# Patient Record
Sex: Female | Born: 1945 | Race: Black or African American | Hispanic: No | Marital: Married | Smoking: Never smoker
Health system: Southern US, Community
[De-identification: ages and names within clinical notes are randomized; demographics above are authoritative.]

## PROBLEM LIST (undated history)

## (undated) DIAGNOSIS — M545 Low back pain, unspecified: Secondary | ICD-10-CM

## (undated) DIAGNOSIS — E785 Hyperlipidemia, unspecified: Secondary | ICD-10-CM

## (undated) DIAGNOSIS — M810 Age-related osteoporosis without current pathological fracture: Secondary | ICD-10-CM

## (undated) DIAGNOSIS — K5790 Diverticulosis of intestine, part unspecified, without perforation or abscess without bleeding: Secondary | ICD-10-CM

## (undated) DIAGNOSIS — J45909 Unspecified asthma, uncomplicated: Secondary | ICD-10-CM

## (undated) DIAGNOSIS — Z9889 Other specified postprocedural states: Secondary | ICD-10-CM

## (undated) DIAGNOSIS — E119 Type 2 diabetes mellitus without complications: Secondary | ICD-10-CM

## (undated) DIAGNOSIS — K219 Gastro-esophageal reflux disease without esophagitis: Secondary | ICD-10-CM

## (undated) DIAGNOSIS — R112 Nausea with vomiting, unspecified: Secondary | ICD-10-CM

## (undated) DIAGNOSIS — D649 Anemia, unspecified: Secondary | ICD-10-CM

## (undated) DIAGNOSIS — M5431 Sciatica, right side: Secondary | ICD-10-CM

## (undated) DIAGNOSIS — M1990 Unspecified osteoarthritis, unspecified site: Secondary | ICD-10-CM

## (undated) DIAGNOSIS — Z9109 Other allergy status, other than to drugs and biological substances: Secondary | ICD-10-CM

## (undated) DIAGNOSIS — J309 Allergic rhinitis, unspecified: Secondary | ICD-10-CM

## (undated) DIAGNOSIS — G8929 Other chronic pain: Secondary | ICD-10-CM

## (undated) DIAGNOSIS — T7840XA Allergy, unspecified, initial encounter: Secondary | ICD-10-CM

## (undated) DIAGNOSIS — M5432 Sciatica, left side: Secondary | ICD-10-CM

## (undated) DIAGNOSIS — K635 Polyp of colon: Secondary | ICD-10-CM

## (undated) DIAGNOSIS — H269 Unspecified cataract: Secondary | ICD-10-CM

## (undated) DIAGNOSIS — G43909 Migraine, unspecified, not intractable, without status migrainosus: Secondary | ICD-10-CM

## (undated) HISTORY — DX: Low back pain, unspecified: M54.50

## (undated) HISTORY — DX: Allergy, unspecified, initial encounter: T78.40XA

## (undated) HISTORY — PX: NASAL SINUS SURGERY: SHX719

## (undated) HISTORY — DX: Low back pain: M54.5

## (undated) HISTORY — DX: Other specified postprocedural states: R11.2

## (undated) HISTORY — DX: Diverticulosis of intestine, part unspecified, without perforation or abscess without bleeding: K57.90

## (undated) HISTORY — PX: DILATION AND CURETTAGE OF UTERUS: SHX78

## (undated) HISTORY — PX: COLONOSCOPY: SHX174

## (undated) HISTORY — DX: Polyp of colon: K63.5

## (undated) HISTORY — DX: Sciatica, right side: M54.31

## (undated) HISTORY — DX: Other chronic pain: G89.29

## (undated) HISTORY — DX: Gastro-esophageal reflux disease without esophagitis: K21.9

## (undated) HISTORY — DX: Other allergy status, other than to drugs and biological substances: Z91.09

## (undated) HISTORY — DX: Other specified postprocedural states: Z98.890

## (undated) HISTORY — PX: DENTAL SURGERY: SHX609

## (undated) HISTORY — DX: Age-related osteoporosis without current pathological fracture: M81.0

## (undated) HISTORY — PX: TUBAL LIGATION: SHX77

## (undated) HISTORY — DX: Unspecified cataract: H26.9

## (undated) HISTORY — DX: Hyperlipidemia, unspecified: E78.5

## (undated) HISTORY — DX: Unspecified asthma, uncomplicated: J45.909

## (undated) HISTORY — DX: Anemia, unspecified: D64.9

## (undated) HISTORY — DX: Type 2 diabetes mellitus without complications: E11.9

## (undated) HISTORY — DX: Allergic rhinitis, unspecified: J30.9

## (undated) HISTORY — DX: Migraine, unspecified, not intractable, without status migrainosus: G43.909

## (undated) HISTORY — DX: Sciatica, right side: M54.32

## (undated) HISTORY — DX: Unspecified osteoarthritis, unspecified site: M19.90

---

## 2010-12-19 HISTORY — PX: CATARACT EXTRACTION, BILATERAL: SHX1313

## 2015-08-17 ENCOUNTER — Other Ambulatory Visit: Payer: Self-pay | Admitting: Internal Medicine

## 2015-08-17 DIAGNOSIS — E119 Type 2 diabetes mellitus without complications: Secondary | ICD-10-CM | POA: Diagnosis not present

## 2015-08-17 DIAGNOSIS — I10 Essential (primary) hypertension: Secondary | ICD-10-CM | POA: Diagnosis not present

## 2015-08-17 DIAGNOSIS — Z0000 Encounter for general adult medical examination without abnormal findings: Secondary | ICD-10-CM | POA: Diagnosis not present

## 2015-08-17 DIAGNOSIS — Z1389 Encounter for screening for other disorder: Secondary | ICD-10-CM | POA: Diagnosis not present

## 2015-09-08 ENCOUNTER — Other Ambulatory Visit: Payer: Self-pay

## 2015-09-08 DIAGNOSIS — Z1231 Encounter for screening mammogram for malignant neoplasm of breast: Secondary | ICD-10-CM

## 2015-09-10 ENCOUNTER — Ambulatory Visit: Payer: Commercial Managed Care - HMO | Admitting: Neurology

## 2015-09-11 ENCOUNTER — Ambulatory Visit: Payer: Medicare HMO | Admitting: Podiatry

## 2015-09-16 ENCOUNTER — Encounter: Payer: Self-pay | Admitting: Internal Medicine

## 2015-09-17 ENCOUNTER — Other Ambulatory Visit: Payer: Self-pay | Admitting: Allergy and Immunology

## 2015-09-17 DIAGNOSIS — J309 Allergic rhinitis, unspecified: Secondary | ICD-10-CM

## 2015-09-21 ENCOUNTER — Ambulatory Visit (AMBULATORY_SURGERY_CENTER): Payer: Self-pay | Admitting: *Deleted

## 2015-09-21 ENCOUNTER — Telehealth: Payer: Self-pay | Admitting: *Deleted

## 2015-09-21 ENCOUNTER — Other Ambulatory Visit: Payer: Self-pay | Admitting: Allergy and Immunology

## 2015-09-21 VITALS — Ht 64.75 in | Wt 145.8 lb

## 2015-09-21 DIAGNOSIS — J309 Allergic rhinitis, unspecified: Secondary | ICD-10-CM

## 2015-09-21 DIAGNOSIS — Z86010 Personal history of colonic polyps: Secondary | ICD-10-CM

## 2015-09-21 NOTE — Telephone Encounter (Signed)
Pt here for Pv for upcoming colon.  Pt states she has had a previous colon- she cannot remember if it was 1 yr ago but states less than 10 but more than 5. She states she had colon polyps but unsure what kind, she isn't sure about MD name, city or hospital.  She did have egd and colon together she states.  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 She states now she has dysphagia, GERD, and certain foods cause pain to swallow and its hard to get the food to pass.  Spoke with Dr Carlean Purl, he reviewed her med list and we discussed above mentioned and decided pt needs an OV, non emergent. Cancelled PV and colon for October and scheduled 12-5 OV with  Dr Carlean Purl. Instructed pt to arrive at 130 pm to ck in on 3rd floor that day . Pt instructed to find out who her previous GI dr was and get name, number, place of procedure so we may get old records. Gave her a records release form to fill in her part with instructions of what we need to get her old records.  Informed pt we needs these records prior to her 11-23-15 OV if at all possible.  Pt verbalized understanding of all this. Encouraged to call with questions.  Lelan Pons PV

## 2015-09-22 ENCOUNTER — Ambulatory Visit (INDEPENDENT_AMBULATORY_CARE_PROVIDER_SITE_OTHER): Payer: Medicare HMO | Admitting: Neurology

## 2015-09-22 ENCOUNTER — Encounter: Payer: Self-pay | Admitting: Neurology

## 2015-09-22 VITALS — BP 123/72 | HR 75 | Ht 64.0 in | Wt 148.0 lb

## 2015-09-22 DIAGNOSIS — M5416 Radiculopathy, lumbar region: Secondary | ICD-10-CM | POA: Diagnosis not present

## 2015-09-22 DIAGNOSIS — M5441 Lumbago with sciatica, right side: Secondary | ICD-10-CM

## 2015-09-22 DIAGNOSIS — M5442 Lumbago with sciatica, left side: Secondary | ICD-10-CM | POA: Diagnosis not present

## 2015-09-22 DIAGNOSIS — E1142 Type 2 diabetes mellitus with diabetic polyneuropathy: Secondary | ICD-10-CM

## 2015-09-22 NOTE — Progress Notes (Signed)
PATIENT: Kimberly Lam DOB: 08/21/1946  Chief Complaint  Patient presents with  . Back Pain    She has been having low back pain for the last few years.  The pain radiates into her bilateral hips and legs. She also has problems with leg cramps and leg weakness.  She has had two epidural steroid injections that were helpful.     HISTORICAL  Kimberly Lam is a 69 years old right-handed female, seen in refer by  her primary care physician Sandi Mariscal, MD in September 22 2015 for evaluation of low back pain, radiating pain to bilateral hip, bilateral leg cramping, and weakness.  She reported a history of low back pain since 2006, midline low back, occasionally go to posterior thigh, go down below her knee, worsening with prolonged standing, or walking,  Previous her low back pain has responded very well to epidural injection, she had 3 total, most recent injection was in March 16 by local neurosurgeon, she reported only temporary improvement, she had recurrent decline low back pain, radiating pain to bilateral lower extremity again, in addition, she complains of bilateral feet paresthesia,   She had a history of diabetes, she denies gait difficulty, no bowel and bladder incontinence, she denies bilateral upper extremity paresthesia or weakness.   REVIEW OF SYSTEMS: Full 14 system review of systems performed and notable only for shortness of breath, hearing loss, ringing in ears, feeling hot, joint pain, cramps, allergy, runny nose, numbness, weakness, difficulty swallowing, insomnia, restless leg, not enough sleep  ALLERGIES: Allergies  Allergen Reactions  . Peanut-Containing Drug Products Anaphylaxis  . Actos [Pioglitazone] Nausea Only  . Vicodin [Hydrocodone-Acetaminophen] Nausea Only    HOME MEDICATIONS: Current Outpatient Prescriptions  Medication Sig Dispense Refill  . albuterol (PROVENTIL) (2.5 MG/3ML) 0.083% nebulizer solution Take 2.5 mg by nebulization every 6 (six) hours as  needed for wheezing or shortness of breath.    Marland Kitchen alendronate (FOSAMAX) 70 MG tablet Take 70 mg by mouth once a week. Take with a full glass of water on an empty stomach.    . Azelastine-Fluticasone 137-50 MCG/ACT SUSP Place into the nose.    . cyclobenzaprine (FLEXERIL) 10 MG tablet Take 10 mg by mouth 3 (three) times daily as needed for muscle spasms.    . CYCLOBENZAPRINE HCL PO Take by mouth.    . cycloSPORINE (RESTASIS) 0.05 % ophthalmic emulsion 1 drop 2 (two) times daily.    . diclofenac (VOLTAREN) 75 MG EC tablet Take 75 mg by mouth 2 (two) times daily.    . fexofenadine-pseudoephedrine (ALLEGRA-D 24) 180-240 MG 24 hr tablet Take 1 tablet by mouth daily.    . fluticasone (FLONASE) 50 MCG/ACT nasal spray Place into both nostrils daily.    Marland Kitchen gabapentin (NEURONTIN) 300 MG capsule Take 300 mg by mouth 3 (three) times daily.    . insulin aspart (NOVOLOG) 100 unit/mL injection Inject 10 Units into the skin 3 (three) times daily before meals.    . insulin regular (NOVOLIN R,HUMULIN R) 100 units/mL injection Inject into the skin. 18-45 units    . levocetirizine (XYZAL) 5 MG tablet Take 5 mg by mouth every evening.    . metFORMIN (GLUCOPHAGE) 500 MG tablet Take by mouth 2 (two) times daily with a meal.    . montelukast (SINGULAIR) 10 MG tablet Take 10 mg by mouth at bedtime.    Marland Kitchen omeprazole (PRILOSEC) 40 MG capsule Take 40 mg by mouth daily.    . ramipril (ALTACE) 2.5 MG capsule  Take 2.5 mg by mouth daily.    . simvastatin (ZOCOR) 20 MG tablet Take 20 mg by mouth daily.     No current facility-administered medications for this visit.    PAST MEDICAL HISTORY: Past Medical History  Diagnosis Date  . Acid reflux   . Environmental allergies     flowers, pollen, trees  . Colon polyps   . Diabetes ([place])   . Migraine   . Hyperlipidemia   . Low back pain     PAST SURGICAL HISTORY: Past Surgical History  Procedure Laterality Date  . Cesarean section      x 2  . Tubal ligation    . Nasal  sinus surgery      FAMILY HISTORY: Family History  Problem Relation Age of Onset  . Prostate cancer Brother   . Glaucoma Brother   . Emphysema Father     SOCIAL HISTORY:  Social History   Social History  . Marital Status: Married    Spouse Name: N/A  . Number of Children: 2  . Years of Education: 16   Occupational History  . Retired    Social History Main Topics  . Smoking status: Never Smoker   . Smokeless tobacco: Not on file  . Alcohol Use: No  . Drug Use: No  . Sexual Activity: Not on file   Other Topics Concern  . Not on file   Social History Narrative   Lives at home with her husband and brother.   Right-handed.   No caffeine use.     PHYSICAL EXAM   Filed Vitals:   09/22/15 1315  BP: 123/72  Pulse: 75  Height: 5\' 4"  (1.626 m)  Weight: 148 lb (67.132 kg)    Not recorded      Body mass index is 25.39 kg/(m^2).  PHYSICAL EXAMNIATION:  Gen: NAD, conversant, well nourised, obese, well groomed                     Cardiovascular: Regular rate rhythm, no peripheral edema, warm, nontender. Eyes: Conjunctivae clear without exudates or hemorrhage Neck: Supple, no carotid bruise. Pulmonary: Clear to auscultation bilaterally   NEUROLOGICAL EXAM:  MENTAL STATUS: Speech:    Speech is normal; fluent and spontaneous with normal comprehension.  Cognition:     Orientation to time, place and person     Normal recent and remote memory     Normal Attention span and concentration     Normal Language, naming, repeating,spontaneous speech     Fund of knowledge   CRANIAL NERVES: CN II: Visual fields are full to confrontation. Fundoscopic exam is normal with sharp discs and no vascular changes. Pupils are round equal and briskly reactive to light. CN III, IV, VI: extraocular movement are normal. No ptosis. CN V: Facial sensation is intact to pinprick in all 3 divisions bilaterally. Corneal responses are intact.  CN VII: Face is symmetric with normal eye  closure and smile. CN VIII: Hearing is normal to rubbing fingers CN IX, X: Palate elevates symmetrically. Phonation is normal. CN XI: Head turning and shoulder shrug are intact CN XII: Tongue is midline with normal movements and no atrophy.  MOTOR: There is no pronator drift of out-stretched arms. Muscle bulk and tone are normal. Muscle strength is normal.  REFLEXES: Reflexes are 2+ and symmetric at the biceps, triceps, knees, and cheese at ankles. Plantar responses are flexor.  SENSORY: Length dependent decreased light touch, pinprick to ankle level  COORDINATION: Rapid alternating  movements and fine finger movements are intact. There is no dysmetria on finger-to-nose and heel-knee-shin.    GAIT/STANCE: Posture is normal. Gait is steady with normal steps, base, arm swing, and turning. Heel and toe walking are normal. Tandem gait is normal.  Romberg is absent.   DIAGNOSTIC DATA (LABS, IMAGING, TESTING) - I reviewed patient records, labs, notes, testing and imaging myself where available.   ASSESSMENT AND PLAN  Rees Matura is a 69 y.o. female   Chronic low back pain, radiating pain to bilateral lower extremity suggestive of lumbar radiculopathy She also has insulin-dependent diabetes, evidence of diabetic peripheral neuropathy  EMG nerve conduction study  MRI of lumbar   Marcial Pacas, M.D. Ph.D.  [facility] [address], [place], [place] [address] Ph: [phone] Fax: [phone]  CC: Sandi Mariscal, MD

## 2015-09-28 ENCOUNTER — Telehealth: Payer: Self-pay | Admitting: Neurology

## 2015-09-28 NOTE — Telephone Encounter (Signed)
Spoke with patient and inform her that I sent over her MRI order on 09/22/15 to [facility]. I also advised her that [facility] tried to call her and left her a message on 09/23/15 to schedule her MRI. I advised the patient to call [facility] [phone] to set up her appointment. Patient stated that she will call them to schedule.

## 2015-09-29 ENCOUNTER — Encounter: Payer: Self-pay | Admitting: Internal Medicine

## 2015-10-07 ENCOUNTER — Ambulatory Visit: Payer: Self-pay

## 2015-10-12 ENCOUNTER — Inpatient Hospital Stay: Admission: RE | Admit: 2015-10-12 | Payer: Self-pay | Source: Ambulatory Visit

## 2015-10-21 DIAGNOSIS — J3089 Other allergic rhinitis: Secondary | ICD-10-CM | POA: Diagnosis not present

## 2015-10-22 ENCOUNTER — Encounter: Payer: Medicare HMO | Admitting: Neurology

## 2015-10-22 DIAGNOSIS — J301 Allergic rhinitis due to pollen: Secondary | ICD-10-CM | POA: Diagnosis not present

## 2015-10-22 NOTE — Progress Notes (Signed)
This encounter was created in error - please disregard.

## 2015-10-27 ENCOUNTER — Other Ambulatory Visit: Payer: Self-pay | Admitting: *Deleted

## 2015-10-27 ENCOUNTER — Ambulatory Visit (INDEPENDENT_AMBULATORY_CARE_PROVIDER_SITE_OTHER): Payer: Medicare HMO

## 2015-10-27 DIAGNOSIS — J309 Allergic rhinitis, unspecified: Secondary | ICD-10-CM

## 2015-10-27 MED ORDER — ALBUTEROL SULFATE HFA 108 (90 BASE) MCG/ACT IN AERS: 2.0000 | INHALATION_SPRAY | RESPIRATORY_TRACT | Status: DC | PRN

## 2015-10-27 NOTE — Progress Notes (Signed)
Immunotherapy   Patient Details  Name: Marlyss Cissell MRN: [id number] Date of Birth: [birth year]  10/27/2015  Fletcher Anon started injections for TREE/GRASS-WEED-MITE-CAT-DOG/MOLD-CR. GREEN 1:1000 @ .05 GIVEN Following schedule: B  Frequency:2 times per week Epi-Pen:Prescription for Epi-Pen given FROM HER PREVIOUS ALLERGIES IN [place] Consent signed and patient instructions given.   Jerrion Tabbert 10/27/2015, 11:10 AM

## 2015-11-03 ENCOUNTER — Other Ambulatory Visit: Payer: Self-pay | Admitting: Allergy and Immunology

## 2015-11-03 MED ORDER — EPINEPHRINE 0.3 MG/0.3ML IJ SOAJ: 0.3000 mg | Freq: Once | INTRAMUSCULAR | Status: DC

## 2015-11-03 NOTE — Telephone Encounter (Signed)
Pt called about an expired Epi-pen. And something about moving to a different state???? She is requesting a new refill [facility] on [address] pls advise

## 2015-11-11 ENCOUNTER — Ambulatory Visit (INDEPENDENT_AMBULATORY_CARE_PROVIDER_SITE_OTHER): Payer: Medicare HMO | Admitting: Neurology

## 2015-11-11 DIAGNOSIS — J309 Allergic rhinitis, unspecified: Secondary | ICD-10-CM | POA: Diagnosis not present

## 2015-11-17 ENCOUNTER — Telehealth: Payer: Self-pay | Admitting: Neurology

## 2015-11-17 ENCOUNTER — Ambulatory Visit (INDEPENDENT_AMBULATORY_CARE_PROVIDER_SITE_OTHER): Payer: Medicare HMO | Admitting: Neurology

## 2015-11-17 ENCOUNTER — Telehealth: Payer: Self-pay

## 2015-11-17 DIAGNOSIS — M5441 Lumbago with sciatica, right side: Secondary | ICD-10-CM

## 2015-11-17 DIAGNOSIS — E1142 Type 2 diabetes mellitus with diabetic polyneuropathy: Secondary | ICD-10-CM

## 2015-11-17 DIAGNOSIS — M5416 Radiculopathy, lumbar region: Secondary | ICD-10-CM

## 2015-11-17 DIAGNOSIS — M5442 Lumbago with sciatica, left side: Secondary | ICD-10-CM

## 2015-11-17 MED ORDER — DICLOFENAC SODIUM 75 MG PO TBEC: DELAYED_RELEASE_TABLET | ORAL | Status: DC

## 2015-11-17 NOTE — Telephone Encounter (Signed)
Kimberly Lam, let her know I have called in Baclofen 75mg  bid as needed x60 tabs with 3 refill. Please advise her to proceed with MRI lumbar as ordered

## 2015-11-17 NOTE — Telephone Encounter (Signed)
Follow up appt scheduled to discuss results.

## 2015-11-17 NOTE — Telephone Encounter (Signed)
Patient would like to know if Dr Krista Blue will start prescribing Diclofenac 75mg  tablets.  Please advise.  Thank you.

## 2015-11-17 NOTE — Procedures (Signed)
[facility]  [place], [place]  [place], [place] [address] Tel: [phone] Fax:  [phone] Test Date:  11/17/2015  Patient: Kimberly Lam DOB: 1946/11/14 Physician: Narda Amber, DO  Sex: Female Height: 5\' 5"  Ref Phys: Dr Krista Blue  ID#: [id number]   Technician: Jerilynn Mages. Dean   Patient Complaints: This is a 69 year old female presenting for evaluation of chronic low back pain radiating into her legs and paresthesias of the feet.  NCV & EMG Findings: Extensive electrodiagnostic testing of the right upper and lower extremities and additional studies of the left shows: 1. Right median and ulnar sensory responses are within normal limits. 2. Right median and ulnar motor responses are within normal limits. 3. Right sural sensory response is absent. Left sural sensory response is reduced in amplitude. Bilateral superficial peroneal sensory responses are within normal limits. 4. Bilateral peroneal and tibial motor responses are within normal limits. 5. Bilateral tibial H reflex studies are within normal limits. 6. Chronic motor axon loss changes are seen affecting the L5 myotomes bilaterally, without accompanied active denervation.  Impression: 1. The electrophysiologic findings are most consistent with a distal and symmetric sensorimotor polyneuropathy, axon loss in type, affecting the lower extremities.  Overall, these findings are mild to moderate in degree electrically and restricted to the lower extremities. 2. 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     ___________________________ Narda Amber, DO    Nerve Conduction Studies Anti Sensory Summary Table   Site NR Peak (ms) Norm Peak (ms) P-T Amp (V) Norm P-T Amp  Right Median Anti Sensory (2nd Digit)  32.2C  Wrist    3.5 <3.8 25.3 >10  Left Sup Peroneal Anti Sensory (Ant Lat Mall)  12 cm    3.8 <4.6 6.9 >3  Right Sup Peroneal Anti Sensory (Ant Lat  Mall)  32.2C  12 cm    3.9 <4.6 9.6 >3  Left Sural Anti Sensory (Lat Mall)  Calf    4.0 <4.6 2.7 >3  Right Sural Anti Sensory (Lat Mall)  32.2C  Calf NR  <4.6  >3  Right Ulnar Anti Sensory (5th Digit)  32.2C  Wrist    3.1 <3.2 19.5 >5   Motor Summary Table   Site NR Onset (ms) Norm Onset (ms) O-P Amp (mV) Norm O-P Amp Site1 Site2 Delta-0 (ms) Dist (cm) Vel (m/s) Norm Vel (m/s)  Right Median Motor (Abd Poll Brev)  32.2C  Wrist    3.5 <4.0 7.8 >5 Elbow Wrist 4.5 26.0 58 >50  Elbow    8.0  7.1         Left Peroneal Motor (Ext Dig Brev)  Ankle    3.4 <6.0 6.9 >2.5 B Fib Ankle 7.2 33.0 46 >40  B Fib    10.6  5.8  Poplt B Fib 1.7 10.0 59 >40  Poplt    12.3  5.3         Right Peroneal Motor (Ext Dig Brev)  32.2C  Ankle    3.8 <6.0 5.9 >2.5 B Fib Ankle 7.1 33.0 46 >40  B Fib    10.9  4.8  Poplt B Fib 1.8 10.0 56 >40  Poplt    12.7  3.5         Left Tibial Motor (Abd Hall Brev)  Ankle    3.6 <6.0 6.1 >4 Knee Ankle 8.7 38.0 44 >40  Knee    12.3  3.4  Right Tibial Motor (Abd Hall Brev)  32.2C  Ankle    3.3 <6.0 8.3 >4 Knee Ankle 9.1 39.0 43 >40  Knee    12.4  6.5         Right Ulnar Motor (Abd Dig Minimi)  32.2C  Wrist    2.7 <3.1 8.2 >7 B Elbow Wrist 3.8 20.0 53 >50  B Elbow    6.5  7.8  A Elbow B Elbow 1.8 10.0 56 >50  A Elbow    8.3  7.7          H Reflex Studies   NR H-Lat (ms) Lat Norm (ms) L-R H-Lat (ms)  Left Tibial (Gastroc)     31.97 <35 0.00  Right Tibial (Gastroc)  32.2C     31.97 <35 0.00   EMG   Side Muscle Ins Act Fibs Psw Fasc Number Recrt Dur Dur. Amp Amp. Poly Poly. Comment  Right AntTibialis Nml Nml Nml Nml 1- Rapid Many 1+ Many 1+ Some 1+ N/A  Right Gastroc Nml Nml Nml Nml 1- Mod-R Some 1+ Some 1+ Nml Nml N/A  Right Flex Dig Long Nml Nml Nml Nml 2- Rapid Some 1+ Some 1+ Nml Nml N/A  Right RectFemoris Nml Nml Nml Nml Nml Nml Nml Nml Nml Nml Nml Nml N/A  Right GluteusMed Nml Nml Nml Nml 1- Rapid Some 1+ Some 1+ Nml Nml N/A  Right BicepsFemS Nml  Nml Nml Nml Nml Nml Nml Nml Nml Nml Nml Nml N/A  Left AntTibialis Nml Nml Nml Nml 1- Rapid Some 1+ Some 1+ Nml Nml N/A  Left Gastroc Nml Nml Nml Nml 1- Mod-R Some 1+ Some 1+ Nml Nml N/A  Left Flex Dig Long Nml Nml Nml Nml 1- Rapid Some 1+ Some 1+ Nml Nml N/A  Left RectFemoris Nml Nml Nml Nml Nml Nml Nml Nml Nml Nml Nml Nml N/A  Left GluteusMed Nml Nml Nml Nml 1- Mod-R Some 1+ Some 1+ Nml Nml N/A      Waveforms:

## 2015-11-17 NOTE — Telephone Encounter (Signed)
I called the patient. Got no answer.  Left message relaying providers note.

## 2015-11-17 NOTE — Telephone Encounter (Signed)
Patient received letter about follow up appointment with Dr. Krista Blue tomorrow. Appointment was cancelled for NCS/EMG, patient was referred to [facility]. Patient wants to know when she will follow up with Dr. Krista Blue for results.

## 2015-11-18 ENCOUNTER — Ambulatory Visit (INDEPENDENT_AMBULATORY_CARE_PROVIDER_SITE_OTHER): Payer: Medicare HMO

## 2015-11-18 ENCOUNTER — Encounter: Payer: Medicare HMO | Admitting: Neurology

## 2015-11-18 DIAGNOSIS — J309 Allergic rhinitis, unspecified: Secondary | ICD-10-CM | POA: Diagnosis not present

## 2015-11-19 ENCOUNTER — Other Ambulatory Visit: Payer: Self-pay | Admitting: Neurology

## 2015-11-19 MED ORDER — LEVOCETIRIZINE DIHYDROCHLORIDE 5 MG PO TABS: 5.0000 mg | ORAL_TABLET | Freq: Every evening | ORAL | Status: DC

## 2015-11-19 MED ORDER — AZELASTINE HCL 0.15 % NA SOLN: 2.0000 | Freq: Two times a day (BID) | NASAL | Status: DC

## 2015-11-19 MED ORDER — FLUTICASONE PROPIONATE 50 MCG/ACT NA SUSP: 2.0000 | Freq: Every day | NASAL | Status: DC

## 2015-11-19 MED ORDER — EPINEPHRINE 0.3 MG/0.3ML IJ SOAJ: 0.3000 mg | Freq: Once | INTRAMUSCULAR | Status: DC

## 2015-11-19 MED ORDER — ALBUTEROL SULFATE HFA 108 (90 BASE) MCG/ACT IN AERS: 2.0000 | INHALATION_SPRAY | Freq: Four times a day (QID) | RESPIRATORY_TRACT | Status: DC | PRN

## 2015-11-23 ENCOUNTER — Ambulatory Visit (INDEPENDENT_AMBULATORY_CARE_PROVIDER_SITE_OTHER): Payer: Medicare HMO | Admitting: Internal Medicine

## 2015-11-23 VITALS — BP 108/58 | HR 84 | Ht 64.0 in | Wt 148.4 lb

## 2015-11-23 DIAGNOSIS — Z86010 Personal history of colonic polyps: Secondary | ICD-10-CM | POA: Diagnosis not present

## 2015-11-23 DIAGNOSIS — R1314 Dysphagia, pharyngoesophageal phase: Secondary | ICD-10-CM

## 2015-11-23 NOTE — Progress Notes (Signed)
   Subjective:    Patient ID: Kimberly Lam, female    DOB: [birth year], 69 y.o.   MRN: [id number] Cc: ? Hx colon polyp(s) and dysphagia HPI Is a very nice elderly African-American woman who used to live in [place], at some point there she had a colonoscopy somewhere around 10 years ago. She think she might have had a polyp but she felt like there was not good follow-up there and she never clearly get recommendations about when to have a repeat colonoscopy though she does recall being told he had diverticulosis also. She had an upper endoscopy for dysphagia there as well. It sounds like she had a dilation. She presented to our previsit nurses and was directed to me for an office visit after her story December to try to sort through this a little bit better. She's not having any active: Symptoms.  She has intermittent chronic dysphagia, sometimes pills or liquids will not go down properly and she'll regurgitate to her nose. She seems to think this began after she had an aspiration and needed endotracheal intubation for a C-section many years ago. It is still bothersome and she would like to understand it better. Sometimes solid foods like grits will cause problems as well.  Medications, allergies, past medical history, past surgical history, family history and social history are reviewed and updated in the EMR.  Review of Systems Decreased hearing night sweats allergies back pain all other review of systems negative or as per history of present illness    Objective:   Physical Exam @BP  108/58 mmHg  Pulse 84  Ht 5\' 4"  (1.626 m)  Wt 148 lb 6 oz (67.302 kg)  BMI 25.46 kg/m2@  General:  Well-developed, well-nourished and in no acute distress Eyes:  anicteric. ENT:   Mouth and posterior pharynx free of lesions.  Neck:   supple w/o thyromegaly or mass.  Lungs: Clear to auscultation bilaterally. Heart:  S1S2, no rubs, murmurs, gallops. Abdomen:  soft, non-tender, no hepatosplenomegaly,  hernia, or mass and BS+.  Rectal: deferred Lymph:  no cervical or supraclavicular adenopathy. Extremities:   no edema, cyanosis or clubbing Neuro:  A&O x 3.  Psych:  appropriate mood and  Affect.   Data Reviewed: Recent primary care visit [facility] medical clinic on [address] [place]. 09/29/2015. Hemoglobin normal at 13.6 MCV normal, platelets normal white count slightly low at 2.1. A1c 6.4%. Glucose 113 otherwise electrolytes and liver chemistries all normal, she had a comprehensive metabolic panel.       Assessment & Plan:   1. Pharyngoesophageal dysphagia   2. Hx of colonic polyp - ?     We'll evaluate the dysphagia with a modified barium swallow. Depending upon month that shows could need an EGD.  Colonoscopy for screening will be scheduled after the modified barium swallow results are reviewed. I will clinical everas a true history of polyps. It seems like a reasonable range to repeat a colonoscopy for screening purposes. If it was negative, unlikely to need another routine colonoscopy.The risks and benefits as well as alternatives of endoscopic procedure(s) have been discussed and reviewed. All questions answered. The patient agrees to proceed.  I appreciate the opportunity to care for this patient. CC: Sandi Mariscal, MD

## 2015-11-23 NOTE — Patient Instructions (Addendum)
  You have been scheduled for a modified barium swallow on _______________ at __________________. Please arrive 15 minutes prior to your test for registration. You will go to [facility] (1st Floor) for your appointment. Please refrain from eating or drinking anything 4 hours prior to your test. Should you need to cancel or reschedule your appointment, please contact [phone] [facility]) or [phone] [facility]). _____________________________________________________________________ A Modified Barium Swallow Study, or MBS, is a special x-ray that is taken to check swallowing skills. It is carried out by a Stage manager and a Psychologist, clinical (SLP). During this test, yourmouth, throat, and esophagus, a muscular tube which connects your mouth to your stomach, is checked. The test will help you, your doctor, and the SLP plan what types of foods and liquids are easier for you to swallow. The SLP will also identify positions and ways to help you swallow more easily and safely. What will happen during an MBS? You will be taken to an x-ray room and seated comfortably. You will be asked to swallow small amounts of food and liquid mixed with barium. Barium is a liquid or paste that allows images of your mouth, throat and esophagus to be seen on x-ray. The x-ray captures moving images of the food you are swallowing as it travels from your mouth through your throat and into your esophagus. This test helps identify whether food or liquid is entering your lungs (aspiration). The test also shows which part of your mouth or throat lacks strength or coordination to move the food or liquid in the right direction. This test typically takes 30 minutes to 1 hour to complete. _______________________________________________________________________  I appreciate the opportunity to care for you. Silvano Rusk, MD, [facility]

## 2015-11-24 ENCOUNTER — Telehealth: Payer: Self-pay

## 2015-11-24 ENCOUNTER — Other Ambulatory Visit (HOSPITAL_COMMUNITY): Payer: Self-pay | Admitting: Internal Medicine

## 2015-11-24 DIAGNOSIS — R1310 Dysphagia, unspecified: Secondary | ICD-10-CM

## 2015-11-24 NOTE — Telephone Encounter (Signed)
Patient informed of modified barium swallow test date and time.  At [facility] 12/01/15 at 11:30AM, arrive 11:15AM, no food /drink restrictions.

## 2015-11-25 ENCOUNTER — Ambulatory Visit (INDEPENDENT_AMBULATORY_CARE_PROVIDER_SITE_OTHER): Payer: Medicare HMO | Admitting: Neurology

## 2015-11-25 ENCOUNTER — Encounter: Payer: Self-pay | Admitting: Neurology

## 2015-11-25 VITALS — BP 113/71 | HR 84 | Ht 64.0 in | Wt 148.0 lb

## 2015-11-25 DIAGNOSIS — M6749 Ganglion, multiple sites: Secondary | ICD-10-CM | POA: Diagnosis not present

## 2015-11-25 DIAGNOSIS — M5416 Radiculopathy, lumbar region: Secondary | ICD-10-CM

## 2015-11-25 DIAGNOSIS — J309 Allergic rhinitis, unspecified: Secondary | ICD-10-CM | POA: Diagnosis not present

## 2015-11-25 DIAGNOSIS — IMO0002 Reserved for concepts with insufficient information to code with codable children: Secondary | ICD-10-CM

## 2015-11-25 MED ORDER — GABAPENTIN 300 MG PO CAPS: 300.0000 mg | ORAL_CAPSULE | Freq: Four times a day (QID) | ORAL | Status: DC

## 2015-11-25 NOTE — Progress Notes (Signed)
Chief Complaint  Patient presents with  . Lumbar Radiculopathy    She would like to review her EMG/NCV results.  She has a pending MRI lumbar on 12/05/15.      PATIENT: Kimberly Lam DOB: 02-26-1946  Chief Complaint  Patient presents with  . Lumbar Radiculopathy    She would like to review her EMG/NCV results.  She has a pending MRI lumbar on 12/05/15.     HISTORICAL  Kimberly Lam is a 69 years old right-handed female, seen in refer by  her primary care physician Sandi Mariscal, MD in September 22 2015 for evaluation of low back pain, radiating pain to bilateral hip, bilateral leg cramping, and weakness.  She reported a history of low back pain since 2006, midline low back, occasionally go to posterior thigh, go down below her knee, worsening with prolonged standing, or walking,  Previous her low back pain has responded very well to epidural injection, she had 3 total, most recent injection was in March 16 by local neurosurgeon, she reported only temporary improvement, she had recurrent decline low back pain, radiating pain to bilateral lower extremity again, in addition, she complains of bilateral feet paresthesia,   She had a history of diabetes, she denies gait difficulty, no bowel and bladder incontinence, she denies bilateral upper extremity paresthesia or weakness.   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   REVIEW OF SYSTEMS: Full 14 system review of systems performed and notable only for as above  ALLERGIES: Allergies  Allergen Reactions  . Peanut-Containing  Drug Products Anaphylaxis  . Actos [Pioglitazone] Nausea Only  . Vicodin [Hydrocodone-Acetaminophen] Nausea Only    HOME MEDICATIONS: Current Outpatient Prescriptions  Medication Sig Dispense Refill  . albuterol (PROVENTIL) (2.5 MG/3ML) 0.083% nebulizer solution Take 2.5 mg by nebulization every 6 (six) hours as needed for wheezing or shortness of breath.    Marland Kitchen alendronate (FOSAMAX) 70 MG tablet Take 70 mg by mouth once a week. Take with a full glass of water on an empty stomach.    . Azelastine-Fluticasone 137-50 MCG/ACT SUSP Place into the nose 2 (two) times daily.     . cyclobenzaprine (FLEXERIL) 10 MG tablet Take 10 mg by mouth 3 (three) times daily as needed for muscle spasms.    . CYCLOBENZAPRINE HCL PO Take by mouth.    . cycloSPORINE (RESTASIS) 0.05 % ophthalmic emulsion 1 drop 2 (two) times daily.    . diclofenac (VOLTAREN) 75 MG EC tablet Bid as needed 60 tablet 3  . fexofenadine-pseudoephedrine (ALLEGRA-D 24) 180-240 MG 24 hr tablet Take 1 tablet by mouth daily.    . fluticasone (FLONASE) 50 MCG/ACT nasal spray Place into both nostrils daily.    Marland Kitchen gabapentin (NEURONTIN) 300 MG capsule Take 300 mg by mouth 3 (three) times daily.    . Insulin Glargine (LANTUS SOLOSTAR) 100 UNIT/ML Solostar Pen Inject into the skin. Inject 18-45 units at bedtime.    Marland Kitchen levocetirizine (XYZAL) 5 MG tablet Take 5 mg by mouth every evening.    . metFORMIN (GLUCOPHAGE) 500 MG tablet Take by mouth 2 (two) times daily with a meal.    .  montelukast (SINGULAIR) 10 MG tablet Take 10 mg by mouth at bedtime.    Marland Kitchen omeprazole (PRILOSEC) 40 MG capsule Take 40 mg by mouth daily.    . ramipril (ALTACE) 2.5 MG capsule Take 2.5 mg by mouth daily.    . simvastatin (ZOCOR) 20 MG tablet Take 20 mg by mouth daily.     No current facility-administered medications for this visit.    PAST MEDICAL HISTORY: Past Medical History  Diagnosis Date  . Acid reflux   . Environmental allergies     flowers, pollen, trees  .  Colon polyps   . Diabetes ([place])   . Migraine   . Hyperlipidemia   . Low back pain     PAST SURGICAL HISTORY: Past Surgical History  Procedure Laterality Date  . Cesarean section      x 2  . Tubal ligation    . Nasal sinus surgery      FAMILY HISTORY: Family History  Problem Relation Age of Onset  . Prostate cancer Brother   . Glaucoma Brother   . Emphysema Father     SOCIAL HISTORY:  Social History   Social History  . Marital Status: Married    Spouse Name: N/A  . Number of Children: 2  . Years of Education: 16   Occupational History  . Retired    Social History Main Topics  . Smoking status: Never Smoker   . Smokeless tobacco: Not on file  . Alcohol Use: No  . Drug Use: No  . Sexual Activity: Not on file   Other Topics Concern  . Not on file   Social History Narrative   Lives at home with her husband and brother.   Right-handed.   No caffeine use.     PHYSICAL EXAM   Filed Vitals:   11/25/15 1648  BP: 113/71  Pulse: 84  Height: 5\' 4"  (1.626 m)  Weight: 148 lb (67.132 kg)    Not recorded      Body mass index is 25.39 kg/(m^2).  PHYSICAL EXAMNIATION:  Gen: NAD, conversant, well nourised, obese, well groomed                     Cardiovascular: Regular rate rhythm, no peripheral edema, warm, nontender. Eyes: Conjunctivae clear without exudates or hemorrhage Neck: Supple, no carotid bruise. Pulmonary: Clear to auscultation bilaterally   NEUROLOGICAL EXAM:  MENTAL STATUS: Speech:    Speech is normal; fluent and spontaneous with normal comprehension.  Cognition:     Orientation to time, place and person     Normal recent and remote memory     Normal Attention span and concentration     Normal Language, naming, repeating,spontaneous speech     Fund of knowledge   CRANIAL NERVES: CN II: Visual fields are full to confrontation. Fundoscopic exam is normal with sharp discs and no vascular changes. Pupils are round equal and briskly  reactive to light. CN III, IV, VI: extraocular movement are normal. No ptosis. CN V: Facial sensation is intact to pinprick in all 3 divisions bilaterally. Corneal responses are intact.  CN VII: Face is symmetric with normal eye closure and smile. CN VIII: Hearing is normal to rubbing fingers CN IX, X: Palate elevates symmetrically. Phonation is normal. CN XI: Head turning and shoulder shrug are intact CN XII: Tongue is midline with normal movements and no atrophy.  MOTOR: There is no pronator drift of out-stretched arms. Muscle bulk and tone are normal. Muscle strength is  normal.  REFLEXES: Reflexes are 2+ and symmetric at the biceps, triceps, knees, and cheese at ankles. Plantar responses are flexor.  SENSORY: Length dependent decreased light touch, pinprick to ankle level  COORDINATION: Rapid alternating movements and fine finger movements are intact. There is no dysmetria on finger-to-nose and heel-knee-shin.    GAIT/STANCE: Posture is normal. Gait is steady with normal steps, base, arm swing, and turning. Heel and toe walking are normal. Tandem gait is normal.  Romberg is absent.   DIAGNOSTIC DATA (LABS, IMAGING, TESTING) - I reviewed patient records, labs, notes, testing and imaging myself where available.   ASSESSMENT AND PLAN  Kimberly Lam is a 69 y.o. female   Chronic low back pain, radiating pain to bilateral lower extremity suggestive of lumbar radiculopathy, right leg muscle cramps  Will increase her gabapentin from 300 mg 3 times a day to 4 times a day no prescription was written  MRI of lumbar She also has insulin-dependent diabetes, evidence of diabetic peripheral neuropathy  Laboratory evaluation from her primary care physician    Right thumb cyst  We will refer her to orthopedic surgeon for evaluation  Marcial Pacas, M.D. Ph.D.  [facility] [address] [place], [place] [address] Ph: [phone] Fax: [phone]  CC: Sandi Mariscal, MD

## 2015-12-01 ENCOUNTER — Ambulatory Visit (HOSPITAL_COMMUNITY): Admission: RE | Admit: 2015-12-01 | Payer: Medicare HMO | Source: Ambulatory Visit

## 2015-12-01 ENCOUNTER — Inpatient Hospital Stay (HOSPITAL_COMMUNITY): Admission: RE | Admit: 2015-12-01 | Payer: Medicare HMO | Source: Ambulatory Visit

## 2015-12-02 ENCOUNTER — Telehealth: Payer: Self-pay | Admitting: Neurology

## 2015-12-02 ENCOUNTER — Ambulatory Visit (INDEPENDENT_AMBULATORY_CARE_PROVIDER_SITE_OTHER): Payer: Medicare HMO

## 2015-12-02 DIAGNOSIS — J309 Allergic rhinitis, unspecified: Secondary | ICD-10-CM | POA: Diagnosis not present

## 2015-12-02 MED ORDER — DICLOFENAC SODIUM 75 MG PO TBEC: 75.0000 mg | DELAYED_RELEASE_TABLET | Freq: Two times a day (BID) | ORAL | Status: DC | PRN

## 2015-12-02 NOTE — Telephone Encounter (Signed)
Rx's have been sent. 

## 2015-12-02 NOTE — Telephone Encounter (Signed)
Pt called for rx of diclofenac (VOLTAREN) 75 MG EC tablet pt needs one month to be sent to [facility] fax [phone], and the last 2 months sent to her mail order pharm. May call pt [phone]

## 2015-12-03 ENCOUNTER — Ambulatory Visit
Admission: RE | Admit: 2015-12-03 | Discharge: 2015-12-03 | Disposition: A | Discharge status: Home / Self Care | Payer: Medicare HMO | Source: Ambulatory Visit | Attending: Neurology | Admitting: Neurology

## 2015-12-03 DIAGNOSIS — M5441 Lumbago with sciatica, right side: Secondary | ICD-10-CM

## 2015-12-03 DIAGNOSIS — M5416 Radiculopathy, lumbar region: Secondary | ICD-10-CM

## 2015-12-03 DIAGNOSIS — M5442 Lumbago with sciatica, left side: Secondary | ICD-10-CM

## 2015-12-03 DIAGNOSIS — E1142 Type 2 diabetes mellitus with diabetic polyneuropathy: Secondary | ICD-10-CM

## 2015-12-05 ENCOUNTER — Other Ambulatory Visit: Payer: Self-pay

## 2015-12-10 ENCOUNTER — Ambulatory Visit (INDEPENDENT_AMBULATORY_CARE_PROVIDER_SITE_OTHER): Payer: Medicare HMO

## 2015-12-10 DIAGNOSIS — J309 Allergic rhinitis, unspecified: Secondary | ICD-10-CM | POA: Diagnosis not present

## 2015-12-15 ENCOUNTER — Telehealth: Payer: Self-pay | Admitting: Neurology

## 2015-12-15 ENCOUNTER — Ambulatory Visit (INDEPENDENT_AMBULATORY_CARE_PROVIDER_SITE_OTHER): Payer: Medicare HMO

## 2015-12-15 DIAGNOSIS — J309 Allergic rhinitis, unspecified: Secondary | ICD-10-CM | POA: Diagnosis not present

## 2015-12-15 NOTE — Telephone Encounter (Signed)
Please call patient, MRI of lumbar showed degenerative changes, most severe at L5 on S1, with mild spinal stenosis, and moderately severe right foraminal stenosis, this will explains her complains of right leg muscle cramping. I will review findings with her at her follow up visit in Jan 2017.  This is an abnormal MRI of the lumbar spine showed the following: 1. 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 This could be confirmed with plain films if any procedures are being planned. 2. 8 mm of anterolisthesis of L5 upon L6/S1 of a degenerative nature due to severe facet hypertrophy. At this level, there is mild spinal stenosis and moderately severe right foraminal narrowing. There could be compression of the right L5 nerve root. 3. Degenerative changes at L6/S1-S2 lead to severe right foraminal narrowing that could lead to compression of the exiting right S1 nerve root

## 2015-12-15 NOTE — Telephone Encounter (Signed)
I called the patient and explained MRI results, per Dr. Rhea Belton note.

## 2015-12-16 ENCOUNTER — Telehealth: Payer: Self-pay

## 2015-12-16 ENCOUNTER — Encounter: Payer: Self-pay | Admitting: Allergy and Immunology

## 2015-12-16 ENCOUNTER — Ambulatory Visit (INDEPENDENT_AMBULATORY_CARE_PROVIDER_SITE_OTHER): Payer: Medicare HMO | Admitting: Allergy and Immunology

## 2015-12-16 VITALS — BP 120/60 | HR 76 | Temp 98.5°F | Resp 12

## 2015-12-16 DIAGNOSIS — J3089 Other allergic rhinitis: Secondary | ICD-10-CM | POA: Insufficient documentation

## 2015-12-16 DIAGNOSIS — J4540 Moderate persistent asthma, uncomplicated: Secondary | ICD-10-CM | POA: Diagnosis not present

## 2015-12-16 MED ORDER — BECLOMETHASONE DIPROPIONATE 40 MCG/ACT IN AERS: 2.0000 | INHALATION_SPRAY | Freq: Two times a day (BID) | RESPIRATORY_TRACT | Status: DC

## 2015-12-16 MED ORDER — AEROCHAMBER PLUS MISC: Status: DC

## 2015-12-16 NOTE — Assessment & Plan Note (Addendum)
   A prescription has been provided for Qvar (beclomethasone) 40 g, 2 inhalations via spacer device twice a day.  Continue montelukast 10 mg daily at bedtime and albuterol every 4-6 hours as needed.  Subjective and objective measures of pulmonary function will be followed and the treatment plan will be adjusted accordingly.

## 2015-12-16 NOTE — Assessment & Plan Note (Signed)
   The patient has been encouraged to receive aeroallergen immunotherapy injections 1-2 times per week during the build-up phase rather than every other week.  Nasal saline lavage (NeilMed) as needed has been recommended along with instructions for proper administration.  Continue azelastine nasal spray and fluticasone nasal spray as needed.  For thick postnasal drainage, I have recommended guaifenesin 1200 mg twice a day with adequate hydration.

## 2015-12-16 NOTE — Telephone Encounter (Signed)
Called and left voicemail for patient to return phone call. Placed spacer at the front desk and need to notify patient. Patient needs to see a nurse before picking the spacer up to show her how to use it.

## 2015-12-16 NOTE — Progress Notes (Signed)
RE: Kimberly Lam MRN: [id number] DOB: 01-Mar-1946 [facility] [place] [address] [place] [address] Date of Office Visit: 12/16/2015  Referring provider: Sandi Mariscal, MD [address],  [address]  History of present illness: HPI Comments: Janina "Kimberly Lam" Solan is a 69 y.o. female with allergic rhinitis and exercise-induced bronchospasm returns today for sick visit. She reports that she is experiencing chest tightness and dyspnea requiring albuterol rescue 3 or 4 times per week, particularly at nighttime. She continues to experience nasal congestion, postnasal drainage, and rhinorrhea daily despite compliance with azelastine and fluticasone nasal spray.  She is not using nasal saline lavage.  She is receiving immunotherapy injections every other week rather than once or twice per week as recommended during immunotherapy buildup.   Assessment and plan: Moderate persistent asthma  A prescription has been provided for Qvar (beclomethasone) 40 g, 2 inhalations via spacer device twice a day.  Continue montelukast 10 mg daily at bedtime and albuterol every 4-6 hours as needed.  Subjective and objective measures of pulmonary function will be followed and the treatment plan will be adjusted accordingly.  Perennial and seasonal allergic rhinoconjunctivitis  The patient has been encouraged to receive aeroallergen immunotherapy injections 1-2 times per week during the build-up phase rather than every other week.  Nasal saline lavage (NeilMed) as needed has been recommended along with instructions for proper administration.  Continue azelastine nasal spray and fluticasone nasal spray as needed.  For thick postnasal drainage, I have recommended guaifenesin 1200 mg twice a day with adequate hydration.    Medications ordered this encounter: Meds ordered this encounter  Medications  . Spacer/Aero-Holding Chambers  (AEROCHAMBER PLUS) inhaler    Sig: Use as instructed with MDI    Dispense:  1 each    Refill:  2  . beclomethasone (QVAR) 40 MCG/ACT inhaler    Sig: Inhale 2 puffs into the lungs 2 (two) times daily.    Dispense:  1 Inhaler    Refill:  2    Diagnositics: Spirometry:  Normal with an FEV1 of 92% predicted.  Please see scanned spirometry results for details.     Physical examination: Blood pressure 120/60, pulse 76, temperature 98.5 F (36.9 C), temperature source Oral, resp. rate 12.  General: Alert, interactive, in no acute distress. HEENT: TMs pearly gray, turbinates moderately edematous without discharge, post-pharynx moderately erythematous. Neck: Supple without lymphadenopathy. Lungs: Clear to auscultation without wheezing, rhonchi or rales. CV: Normal S1, S2 without murmurs. Skin: Warm and dry, without lesions or rashes.  The following portions of the patient's history were reviewed and updated as appropriate: allergies, current medications, past family history, past medical history, past social history, past surgical history and problem list.  Outpatient medications:   Medication List       This list is accurate as of: 12/16/15  1:48 PM.  Always use your most recent med list.               ACIDOPHILUS PROBIOTIC PO  Take 1 tablet by mouth daily.     AEROCHAMBER PLUS inhaler  Use as instructed with MDI     albuterol 108 (90 Base) MCG/ACT inhaler  Commonly known as:  VENTOLIN HFA  Inhale 2 puffs into the lungs every 6 (six) hours as needed for wheezing or shortness of breath.     alendronate 70 MG tablet  Commonly known as:  FOSAMAX  Take 70 mg by  mouth once a week. Take with a full glass of water on an empty stomach.     AMBULATORY NON FORMULARY MEDICATION  Insulin Control Formula Take 1 tablet by mouth as needed     Azelastine HCl 0.15 % Soln  Place 2 sprays into both nostrils 2 (two) times daily.     baclofen 10 MG tablet  Commonly known as:  LIORESAL   Take 10 mg by mouth 2 (two) times daily.     beclomethasone 40 MCG/ACT inhaler  Commonly known as:  QVAR  Inhale 2 puffs into the lungs 2 (two) times daily.     CALCIUM 600/VITAMIN D3 PO  Take 1 tablet by mouth as needed.     CINNAMON PO  Take 1 tablet by mouth as needed.     Cranberry 450 MG Caps  Take 1 capsule by mouth as needed.     CYCLOBENZAPRINE HCL PO  Take by mouth.     cycloSPORINE 0.05 % ophthalmic emulsion  Commonly known as:  RESTASIS  1 drop 2 (two) times daily.     diclofenac 75 MG EC tablet  Commonly known as:  VOLTAREN  Take 1 tablet (75 mg total) by mouth 2 (two) times daily as needed.     ECHINACEA COMB/GOLDEN SEAL PO  Take 1 tablet by mouth as needed. Reported on 12/16/2015     EPINEPHrine 0.3 mg/0.3 mL Soaj injection  Commonly known as:  EPIPEN 2-PAK  Inject 0.3 mLs (0.3 mg total) into the muscle once.     fexofenadine-pseudoephedrine 180-240 MG 24 hr tablet  Commonly known as:  ALLEGRA-D 24  Take 1 tablet by mouth daily. Reported on 12/16/2015     Flaxseed Oil 1000 MG Caps  Take 1 capsule by mouth as needed.     fluticasone 50 MCG/ACT nasal spray  Commonly known as:  FLONASE  Place 2 sprays into both nostrils daily.     gabapentin 300 MG capsule  Commonly known as:  NEURONTIN  Take 1 capsule (300 mg total) by mouth 4 (four) times daily.     GREEN TEA PO  Take by mouth as needed. Reported on 12/16/2015     LANTUS SOLOSTAR 100 UNIT/ML Solostar Pen  Generic drug:  Insulin Glargine  Inject into the skin. Inject 18-45 units at bedtime.     levocetirizine 5 MG tablet  Commonly known as:  XYZAL  Take 1 tablet (5 mg total) by mouth every evening.     Melatonin 5 MG Tabs  Take 1 tablet by mouth as needed. Reported on 12/16/2015     metFORMIN 500 MG tablet  Commonly known as:  GLUCOPHAGE  Take by mouth 2 (two) times daily with a meal.     montelukast 10 MG tablet  Commonly known as:  SINGULAIR  Take 1 tablet by mouth daily.     OMEGA  3 PO  Take 1 tablet by mouth as needed.     omeprazole 40 MG capsule  Commonly known as:  PRILOSEC  Take 40 mg by mouth daily.     [place] Grape Root Powd  Take 1 tablet by mouth as needed. Reported on 12/16/2015     Potassium Gluconate 595 MG Caps  Take 1 capsule by mouth as needed.     ramipril 2.5 MG capsule  Commonly known as:  ALTACE  Take 2.5 mg by mouth daily.     simvastatin 20 MG tablet  Commonly known as:  ZOCOR  Take 20 mg by mouth  daily.     Stinging Nettle 2 % Powd  Take 1 tablet by mouth as needed. Reported on 12/16/2015     TURMERIC PO  Take 800 mg by mouth as needed. Reported on 12/16/2015     Vitamin B-12 CR 1500 MCG Tbcr  Take 1 tablet by mouth as needed.     vitamin C 1000 MG tablet  Take 1,000 mg by mouth as needed.        Known medication allergies: Allergies  Allergen Reactions  . Peanut-Containing Drug Products Anaphylaxis  . Actos [Pioglitazone] Nausea Only  . Actos [Pioglitazone]   . Milk-Related Compounds Nausea And Vomiting    Milk and cheese  . Other Swelling    Flowers,trees,grass,pollen Throat swelling, wheeze,cough  . Peanuts [Peanut Oil] Swelling  . Soy Allergy Nausea Only    Pt states had allergy testing and was told allergic to soy-causes nausea but no other reactions  . Vicodin [Hydrocodone-Acetaminophen] Nausea Only    nausea  . Vicodin [Hydrocodone-Acetaminophen] Nausea Only    I appreciate the opportunity to take part in this Sytrina's care. Please do not hesitate to contact me with questions.  Sincerely,   R. Edgar Frisk, MD

## 2015-12-16 NOTE — Telephone Encounter (Signed)
Patient was seen today and she called her insurance to see if the aro spacer would be covered, and it needs an authorization first.  Please Advise  Thanks

## 2015-12-16 NOTE — Patient Instructions (Signed)
Moderate persistent asthma  A prescription has been provided for Qvar (beclomethasone) 40 g, 2 inhalations via spacer device twice a day.  Continue montelukast 10 mg daily at bedtime and albuterol every 4-6 hours as needed.  Subjective and objective measures of pulmonary function will be followed and the treatment plan will be adjusted accordingly.  Perennial and seasonal allergic rhinoconjunctivitis  The patient has been encouraged to receive aeroallergen immunotherapy injections 1-2 times per week during the build-up phase rather than every other week.  Nasal saline lavage (NeilMed) as needed has been recommended along with instructions for proper administration.  Continue azelastine nasal spray and fluticasone nasal spray as needed.  For thick postnasal drainage, I have recommended guaifenesin 1200 mg twice a day with adequate hydration.   Return in about 4 months (around 04/15/2016), or if symptoms worsen or fail to improve.

## 2015-12-16 NOTE — Telephone Encounter (Signed)
Spoke with patient and advised to see a nurse before picking up spacer .

## 2015-12-23 ENCOUNTER — Ambulatory Visit (INDEPENDENT_AMBULATORY_CARE_PROVIDER_SITE_OTHER): Payer: Medicare HMO

## 2015-12-23 DIAGNOSIS — J309 Allergic rhinitis, unspecified: Secondary | ICD-10-CM | POA: Diagnosis not present

## 2015-12-30 ENCOUNTER — Ambulatory Visit (INDEPENDENT_AMBULATORY_CARE_PROVIDER_SITE_OTHER): Payer: Medicare HMO

## 2015-12-30 DIAGNOSIS — J309 Allergic rhinitis, unspecified: Secondary | ICD-10-CM | POA: Diagnosis not present

## 2016-01-01 ENCOUNTER — Other Ambulatory Visit: Payer: Self-pay | Admitting: Allergy and Immunology

## 2016-01-06 ENCOUNTER — Ambulatory Visit (INDEPENDENT_AMBULATORY_CARE_PROVIDER_SITE_OTHER): Payer: Medicare HMO

## 2016-01-06 DIAGNOSIS — J309 Allergic rhinitis, unspecified: Secondary | ICD-10-CM

## 2016-01-12 ENCOUNTER — Encounter: Payer: Self-pay | Admitting: Neurology

## 2016-01-12 ENCOUNTER — Ambulatory Visit (INDEPENDENT_AMBULATORY_CARE_PROVIDER_SITE_OTHER): Payer: Medicare HMO

## 2016-01-12 ENCOUNTER — Ambulatory Visit (INDEPENDENT_AMBULATORY_CARE_PROVIDER_SITE_OTHER): Payer: Medicare HMO | Admitting: Neurology

## 2016-01-12 VITALS — BP 124/68 | HR 75

## 2016-01-12 DIAGNOSIS — J309 Allergic rhinitis, unspecified: Secondary | ICD-10-CM | POA: Diagnosis not present

## 2016-01-12 DIAGNOSIS — E1140 Type 2 diabetes mellitus with diabetic neuropathy, unspecified: Secondary | ICD-10-CM

## 2016-01-12 DIAGNOSIS — M5416 Radiculopathy, lumbar region: Secondary | ICD-10-CM | POA: Diagnosis not present

## 2016-01-12 NOTE — Progress Notes (Signed)
Chief Complaint  Patient presents with  . Lumbar Radiculopathy    She would like to further discuss her MRI results.  Says prolonged standing causes her pain to worsen and she tries to rest.  Walking tends to be helpful.  She never tried the additional tablet of gabapentin discussed at her last visit.  She is still just taking gabapentin 300mg , three times daily.   Chief Complaint  Patient presents with  . Lumbar Radiculopathy    She would like to further discuss her MRI results.  Says prolonged standing causes her pain to worsen and she tries to rest.  Walking tends to be helpful.  She never tried the additional tablet of gabapentin discussed at her last visit.  She is still just taking gabapentin 300mg , three times daily.      PATIENT: Kimberly Lam DOB: 15-Mar-1946  Chief Complaint  Patient presents with  . Lumbar Radiculopathy    She would like to further discuss her MRI results.  Says prolonged standing causes her pain to worsen and she tries to rest.  Walking tends to be helpful.  She never tried the additional tablet of gabapentin discussed at her last visit.  She is still just taking gabapentin 300mg , three times daily.     HISTORICAL  Kimberly Lam is a 70 years old right-handed female, seen in refer by  her primary care physician Kimberly Mariscal, MD in September 22 2015 for evaluation of low back pain, radiating pain to bilateral hip, bilateral leg cramping, and weakness.  She reported a history of low back pain since 2006, midline low back, occasionally go to posterior thigh, go down below her knee, worsening with prolonged standing, or walking,  Previous her low back pain has responded very well to epidural injection, she had 3 total, most recent injection was in March 16 by local neurosurgeon, she reported only temporary improvement, she had recurrent decline low back pain, radiating pain to bilateral lower extremity again, in addition, she complains of bilateral feet  paresthesia,   She had a history of diabetes, she denies gait difficulty, no bowel and bladder incontinence, she denies bilateral upper extremity paresthesia or weakness.   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   UPDATE Jan 12 2016:  I have reviewed and summarized her office note from Dr. Franciso Bend, [facility] [place] [facility], [place], she was diagnosed with degenerative spondylolisthesis L4-L5, L5-S1, neurogenic claudication, low back pain, received epidural injection in Feb 2015, benefit lasted about one year  Now she complains of right ankle stings and burn pain especially at nighttime, she is taking gabapentin 300 mg 3 times a day, which does help her symptoms,  We have personally reviewed MRI of lumbar spine December 2016, There is a transitional L6/S1 vertebral body. 8 mm of anterolisthesis of L5 upon L6/S1 of a degenerative nature due to severe facet hypertrophy. At this level, there is mild spinal stenosis and moderately severe right foraminal narrowing. There could be compression of the right L5 nerve root.  Degenerative changes at L6/S1-S2 lead to severe right foraminal narrowing that could lead to compression of  the exiting right S1 nerve root   REVIEW OF SYSTEMS: Full 14 system review of systems performed and notable only for as above  ALLERGIES: Allergies  Allergen Reactions  . Peanut-Containing Drug Products Anaphylaxis  . Actos [Pioglitazone] Nausea Only  . Actos [Pioglitazone]   . Milk-Related Compounds Nausea And Vomiting    Milk and cheese  . Other Swelling     Flowers,trees,grass,pollen Throat swelling, wheeze,cough  . Peanuts [Peanut Oil] Swelling  . Soy Allergy Nausea Only    Pt states had allergy testing and was told allergic to soy-causes nausea but no other reactions  . Vicodin [Hydrocodone-Acetaminophen] Nausea Only    nausea  . Vicodin [Hydrocodone-Acetaminophen] Nausea Only    HOME MEDICATIONS: Current Outpatient Prescriptions  Medication Sig Dispense Refill  . alendronate (FOSAMAX) 70 MG tablet Take 70 mg by mouth once a week. Take with a full glass of water on an empty stomach.    . AMBULATORY NON FORMULARY MEDICATION Insulin Control Formula Take 1 tablet by mouth as needed    . Ascorbic Acid (VITAMIN C) 1000 MG tablet Take 1,000 mg by mouth as needed.    . Azelastine HCl 0.15 % SOLN Place 2 sprays into both nostrils 2 (two) times daily. 30 mL 5  . baclofen (LIORESAL) 10 MG tablet Take 10 mg by mouth 2 (two) times daily.    . beclomethasone (QVAR) 40 MCG/ACT inhaler Inhale 2 puffs into the lungs 2 (two) times daily. 1 Inhaler 2  . Calcium Carb-Cholecalciferol (CALCIUM 600/VITAMIN D3 PO) Take 1 tablet by mouth as needed.    Marland Kitchen CINNAMON PO Take 1 tablet by mouth as needed.    . Cranberry 450 MG CAPS Take 1 capsule by mouth as needed.    . Cyanocobalamin (VITAMIN B-12 CR) 1500 MCG TBCR Take 1 tablet by mouth as needed.    . CYCLOBENZAPRINE HCL PO Take by mouth.    . cycloSPORINE (RESTASIS) 0.05 % ophthalmic emulsion 1 drop 2 (two) times daily.    . diclofenac (VOLTAREN) 75 MG EC tablet Take 1 tablet (75 mg total) by mouth 2 (two) times daily as needed. 180 tablet 0  . Echinacea-Goldenseal (ECHINACEA COMB/GOLDEN SEAL PO) Take 1 tablet by mouth as needed. Reported on 12/16/2015    . EPINEPHrine (EPIPEN 2-PAK) 0.3 mg/0.3 mL IJ SOAJ injection Inject 0.3 mLs (0.3 mg total) into the muscle once. 2 Device 1  . fexofenadine-pseudoephedrine (ALLEGRA-D 24) 180-240 MG 24 hr tablet Take 1 tablet by mouth daily. Reported on 12/16/2015    . Flaxseed,  Linseed, (FLAXSEED OIL) 1000 MG CAPS Take 1 capsule by mouth as needed.    . fluticasone (FLONASE) 50 MCG/ACT nasal spray Place 2 sprays into both nostrils daily. 16 g 3  . gabapentin (NEURONTIN) 300 MG capsule Take 1 capsule (300 mg total) by mouth 4 (four) times daily. 360 capsule 3  . Green Tea, Camillia sinensis, (GREEN TEA PO) Take by mouth as needed. Reported on 12/16/2015    . Insulin Glargine (LANTUS SOLOSTAR) 100 UNIT/ML Solostar Pen Inject into the skin. Inject 18-45 units at bedtime.    . Lactobacillus (ACIDOPHILUS PROBIOTIC PO) Take 1 tablet by mouth daily.    Marland Kitchen levocetirizine (XYZAL) 5 MG tablet Take 1 tablet (5 mg total) by mouth every evening. 90 tablet 1  . Melatonin 5 MG TABS Take 1 tablet by mouth as needed. Reported on 12/16/2015    . metFORMIN (GLUCOPHAGE) 500 MG tablet Take by mouth 2 (two) times daily with a meal.    .  montelukast (SINGULAIR) 10 MG tablet Take 1 tablet by mouth daily.    . Omega-3 Fatty Acids (OMEGA 3 PO) Take 1 tablet by mouth as needed.    Marland Kitchen omeprazole (PRILOSEC) 40 MG capsule Take 40 mg by mouth daily.    . [place] Grape Root POWD Take 1 tablet by mouth as needed. Reported on 12/16/2015    . Potassium Gluconate 595 MG CAPS Take 1 capsule by mouth as needed.    . ramipril (ALTACE) 2.5 MG capsule Take 2.5 mg by mouth daily.    . simvastatin (ZOCOR) 20 MG tablet Take 20 mg by mouth daily.    Marland Kitchen Spacer/Aero-Holding Chambers (AEROCHAMBER PLUS) inhaler Use as instructed with MDI 1 each 2  . Stinging Nettle 2 % POWD Take 1 tablet by mouth as needed. Reported on 12/16/2015    . TURMERIC PO Take 800 mg by mouth as needed. Reported on 12/16/2015    . VENTOLIN HFA 108 (90 Base) MCG/ACT inhaler INHALE 2 PUFFS EVERY SIX HOURS AS NEEDED FOR WHEEZING OR SHORTNESS OF BREATH. 18 g 1   No current facility-administered medications for this visit.    PAST MEDICAL HISTORY: Past Medical History  Diagnosis Date  . Acid reflux   . Environmental allergies     flowers,  pollen, trees  . Colon polyps   . Diabetes ([place])   . Migraine   . Hyperlipidemia   . Low back pain     PAST SURGICAL HISTORY: Past Surgical History  Procedure Laterality Date  . Cesarean section    . Cesarean section      x 2  . Tubal ligation    . Nasal sinus surgery      FAMILY HISTORY: Family History  Problem Relation Age of Onset  . Diabetes Mellitus II Brother     x 2  . Hyperlipidemia Brother   . Hypertension Brother   . Pancreatic cancer Brother   . Asthma Father   . Heart disease Maternal Grandmother     hardening of the arteries  . Heart murmur Daughter   . Prostate cancer Brother   . Glaucoma Brother   . Emphysema Father     SOCIAL HISTORY:  Social History   Social History  . Marital Status: Married    Spouse Name: N/A  . Number of Children: 2  . Years of Education: 16   Occupational History  . retired   . Retired    Social History Main Topics  . Smoking status: Never Smoker   . Smokeless tobacco: Not on file  . Alcohol Use: No  . Drug Use: No  . Sexual Activity: Not on file   Other Topics Concern  . Not on file   Social History Narrative   ** Merged History Encounter **       ** Data from: 11/23/15 Enc Dept: [facility]   Married 1 son one daughter, relocated to [place] after living in [place] for many years. She is a Writer of [employer]. No caffeine.       ** Data from: 11/25/15 Enc Dept: [facility]   Lives at home with her husband and brother. Right-handed. No caffeine use.     PHYSICAL EXAM   Filed Vitals:   01/12/16 1205  BP: 124/68  Pulse: 75    Not recorded      There is no weight on file to calculate BMI.  PHYSICAL EXAMNIATION:  Gen: NAD, conversant, well nourised, obese,  well groomed                     Cardiovascular: Regular rate rhythm, no peripheral edema, warm, nontender. Eyes: Conjunctivae clear without exudates or hemorrhage Neck: Supple, no carotid  bruise. Pulmonary: Clear to auscultation bilaterally   NEUROLOGICAL EXAM:  MENTAL STATUS: Speech:    Speech is normal; fluent and spontaneous with normal comprehension.  Cognition:     Orientation to time, place and person     Normal recent and remote memory     Normal Attention span and concentration     Normal Language, naming, repeating,spontaneous speech     Fund of knowledge   CRANIAL NERVES: CN II: Visual fields are full to confrontation.  Pupils are round equal and briskly reactive to light. CN III, IV, VI: extraocular movement are normal. No ptosis. CN V: Facial sensation is intact to pinprick in all 3 divisions bilaterally. Corneal responses are intact.  CN VII: Face is symmetric with normal eye closure and smile. CN VIII: Hearing is normal to rubbing fingers CN IX, X: Palate elevates symmetrically. Phonation is normal. CN XI: Head turning and shoulder shrug are intact CN XII: Tongue is midline with normal movements and no atrophy.  MOTOR: There is no pronator drift of out-stretched arms. Muscle bulk and tone are normal. Muscle strength is normal.  REFLEXES: Reflexes are 2+ and symmetric at the biceps, triceps, knees, and cheese at ankles. Plantar responses are flexor.  SENSORY: Length dependent decreased light touch, pinprick to ankle level  COORDINATION: Rapid alternating movements and fine finger movements are intact. There is no dysmetria on finger-to-nose and heel-knee-shin.    GAIT/STANCE: Posture is normal. Gait is steady with normal steps, base, arm swing, and turning. Heel and toe walking are normal. Tandem gait is normal.  Romberg is absent.   DIAGNOSTIC DATA (LABS, IMAGING, TESTING) - I reviewed patient records, labs, notes, testing and imaging myself where available.   ASSESSMENT AND PLAN  Azailya Mcgannon is a 70 y.o. female   Chronic low back pain, radiating pain to bilateral lower extremity  Consistent with lumbar stenosis  She has no  significant gait difficulty, distal weakness,  After discussed with patient, she decided to hold of surgical evaluation,  I will refer her to pain management for epidural injection  She also has insulin-dependent diabetes, evidence of diabetic peripheral neuropathy   Marcial Pacas, M.D. Ph.D.  [facility] [address], [place], [place] [address] Ph: [phone] Fax: [phone]  CC: Kimberly Mariscal, MD

## 2016-01-18 ENCOUNTER — Telehealth: Payer: Self-pay | Admitting: *Deleted

## 2016-01-18 MED ORDER — BACLOFEN 10 MG PO TABS: 10.0000 mg | ORAL_TABLET | Freq: Two times a day (BID) | ORAL | Status: DC

## 2016-01-18 NOTE — Telephone Encounter (Signed)
Patient requested 90 day sent to her mail order pharamay.  RX sent.

## 2016-01-18 NOTE — Telephone Encounter (Signed)
Patient called requesting a prescription for baclofen 10mg , BID.  It has not been filled by you previously.  Is it ok to send in a prescription for her?

## 2016-01-18 NOTE — Telephone Encounter (Signed)
I have refilled her baclofen 10 mg twice a day, 60 tablets with 6 refill,

## 2016-01-18 NOTE — Addendum Note (Signed)
Addended by: Marcial Pacas on: 01/18/2016 02:06 PM   Modules accepted: Orders

## 2016-01-18 NOTE — Addendum Note (Signed)
Addended by: Noberto Retort C on: 01/18/2016 02:48 PM   Modules accepted: Orders

## 2016-01-19 ENCOUNTER — Other Ambulatory Visit: Payer: Self-pay | Admitting: Allergy and Immunology

## 2016-01-20 ENCOUNTER — Ambulatory Visit (INDEPENDENT_AMBULATORY_CARE_PROVIDER_SITE_OTHER): Payer: Medicare HMO

## 2016-01-20 DIAGNOSIS — J309 Allergic rhinitis, unspecified: Secondary | ICD-10-CM

## 2016-01-26 ENCOUNTER — Ambulatory Visit (INDEPENDENT_AMBULATORY_CARE_PROVIDER_SITE_OTHER): Payer: Medicare HMO | Admitting: *Deleted

## 2016-01-26 DIAGNOSIS — J309 Allergic rhinitis, unspecified: Secondary | ICD-10-CM | POA: Diagnosis not present

## 2016-01-28 ENCOUNTER — Other Ambulatory Visit: Payer: Self-pay | Admitting: Neurology

## 2016-02-02 ENCOUNTER — Ambulatory Visit (INDEPENDENT_AMBULATORY_CARE_PROVIDER_SITE_OTHER): Payer: Medicare HMO | Admitting: *Deleted

## 2016-02-02 DIAGNOSIS — J309 Allergic rhinitis, unspecified: Secondary | ICD-10-CM | POA: Diagnosis not present

## 2016-02-10 ENCOUNTER — Ambulatory Visit (INDEPENDENT_AMBULATORY_CARE_PROVIDER_SITE_OTHER): Payer: Medicare HMO

## 2016-02-10 ENCOUNTER — Other Ambulatory Visit: Payer: Self-pay

## 2016-02-10 DIAGNOSIS — J309 Allergic rhinitis, unspecified: Secondary | ICD-10-CM | POA: Diagnosis not present

## 2016-02-10 NOTE — Telephone Encounter (Signed)
Patient stopped by to leave a message with Dr. Dennison Mascot Nurse. When she uses the Qvar, it gives her a temporary sore throat for 12 to 24 hours. She is rinsing her mouth after using it each time. She stopped using the Qvar on the first of last week.  Please Advise  Thanks

## 2016-02-10 NOTE — Telephone Encounter (Signed)
Called and left voicemail for patient to return phone call

## 2016-02-11 NOTE — Telephone Encounter (Signed)
Called and spoke to patient who is asking if we could switch her to another inhaler due to this sore throat with the Qvar. Patient is using a spacer and doses rinse, gargle , and spit after each use. Notified patient that we would speak with Dr Verlin Fester on Monday. Advised that Dr Verlin Fester was out of the office until Monday and patient said that was ok.

## 2016-02-15 NOTE — Telephone Encounter (Signed)
Called and left voicemail pending med to see which pharmacy patient would like it sent to and sample left at front desk for patient to pick up.

## 2016-02-15 NOTE — Addendum Note (Signed)
Addended by: Christian Mate on: 02/15/2016 08:55 AM   Modules accepted: Orders

## 2016-02-15 NOTE — Telephone Encounter (Signed)
Please provide a sample, prescription, and savings card for Arnuity 100, one inhalation daily.  Discontinue Qvar for now.  Thank you.

## 2016-02-16 ENCOUNTER — Telehealth: Payer: Self-pay

## 2016-02-16 NOTE — Telephone Encounter (Signed)
Tried to call patient.  No answer.  Left message to either call me at the [place] office today or the [place] office.

## 2016-02-17 ENCOUNTER — Ambulatory Visit (INDEPENDENT_AMBULATORY_CARE_PROVIDER_SITE_OTHER): Payer: Medicare HMO

## 2016-02-17 DIAGNOSIS — J309 Allergic rhinitis, unspecified: Secondary | ICD-10-CM

## 2016-02-17 MED ORDER — FLUTICASONE FUROATE 100 MCG/ACT IN AEPB: 1.0000 | INHALATION_SPRAY | Freq: Every day | RESPIRATORY_TRACT | Status: DC

## 2016-02-17 NOTE — Telephone Encounter (Signed)
See telephone contact on 02/16/16.

## 2016-02-17 NOTE — Telephone Encounter (Signed)
Called and spoke to patient and advised of sample at the front and sent in inhaler to her pharmacy.

## 2016-02-17 NOTE — Addendum Note (Signed)
Addended by: Mendel Corning E on: 02/17/2016 11:15 AM   Modules accepted: Orders

## 2016-02-22 ENCOUNTER — Ambulatory Visit (INDEPENDENT_AMBULATORY_CARE_PROVIDER_SITE_OTHER): Payer: Medicare HMO

## 2016-02-22 DIAGNOSIS — J309 Allergic rhinitis, unspecified: Secondary | ICD-10-CM | POA: Diagnosis not present

## 2016-03-01 ENCOUNTER — Ambulatory Visit (INDEPENDENT_AMBULATORY_CARE_PROVIDER_SITE_OTHER): Payer: Medicare HMO | Admitting: *Deleted

## 2016-03-01 DIAGNOSIS — J309 Allergic rhinitis, unspecified: Secondary | ICD-10-CM

## 2016-03-08 ENCOUNTER — Ambulatory Visit (INDEPENDENT_AMBULATORY_CARE_PROVIDER_SITE_OTHER): Payer: Medicare HMO

## 2016-03-08 ENCOUNTER — Other Ambulatory Visit: Payer: Self-pay

## 2016-03-08 DIAGNOSIS — J309 Allergic rhinitis, unspecified: Secondary | ICD-10-CM

## 2016-03-11 ENCOUNTER — Other Ambulatory Visit: Payer: Self-pay

## 2016-03-14 ENCOUNTER — Telehealth: Payer: Self-pay

## 2016-03-14 NOTE — Telephone Encounter (Signed)
Per [employer] no PA needed for Azalastine

## 2016-03-15 ENCOUNTER — Ambulatory Visit (INDEPENDENT_AMBULATORY_CARE_PROVIDER_SITE_OTHER): Payer: Medicare HMO

## 2016-03-15 ENCOUNTER — Other Ambulatory Visit: Payer: Self-pay | Admitting: Allergy and Immunology

## 2016-03-15 DIAGNOSIS — J309 Allergic rhinitis, unspecified: Secondary | ICD-10-CM

## 2016-03-17 ENCOUNTER — Other Ambulatory Visit: Payer: Self-pay

## 2016-03-17 MED ORDER — IPRATROPIUM BROMIDE 0.06 % NA SOLN: NASAL | Status: DC

## 2016-03-21 ENCOUNTER — Ambulatory Visit (INDEPENDENT_AMBULATORY_CARE_PROVIDER_SITE_OTHER): Payer: Medicare HMO

## 2016-03-21 DIAGNOSIS — J309 Allergic rhinitis, unspecified: Secondary | ICD-10-CM | POA: Diagnosis not present

## 2016-03-29 ENCOUNTER — Ambulatory Visit (INDEPENDENT_AMBULATORY_CARE_PROVIDER_SITE_OTHER): Payer: Medicare HMO | Admitting: *Deleted

## 2016-03-29 DIAGNOSIS — J309 Allergic rhinitis, unspecified: Secondary | ICD-10-CM | POA: Diagnosis not present

## 2016-04-05 ENCOUNTER — Ambulatory Visit (INDEPENDENT_AMBULATORY_CARE_PROVIDER_SITE_OTHER): Payer: Medicare HMO

## 2016-04-05 DIAGNOSIS — J309 Allergic rhinitis, unspecified: Secondary | ICD-10-CM

## 2016-04-11 ENCOUNTER — Ambulatory Visit (INDEPENDENT_AMBULATORY_CARE_PROVIDER_SITE_OTHER): Payer: Medicare HMO

## 2016-04-11 DIAGNOSIS — J309 Allergic rhinitis, unspecified: Secondary | ICD-10-CM

## 2016-04-15 ENCOUNTER — Ambulatory Visit: Payer: Medicare HMO | Admitting: Physical Medicine & Rehabilitation

## 2016-04-20 ENCOUNTER — Ambulatory Visit (INDEPENDENT_AMBULATORY_CARE_PROVIDER_SITE_OTHER): Payer: Medicare HMO | Admitting: *Deleted

## 2016-04-20 DIAGNOSIS — J309 Allergic rhinitis, unspecified: Secondary | ICD-10-CM | POA: Diagnosis not present

## 2016-04-21 DIAGNOSIS — J301 Allergic rhinitis due to pollen: Secondary | ICD-10-CM | POA: Diagnosis not present

## 2016-04-22 DIAGNOSIS — J3089 Other allergic rhinitis: Secondary | ICD-10-CM | POA: Diagnosis not present

## 2016-04-25 ENCOUNTER — Telehealth: Payer: Self-pay | Admitting: *Deleted

## 2016-04-25 NOTE — Telephone Encounter (Signed)
Faxed Tier Exception letter to [facility]. Left message for patient to call office.

## 2016-04-26 ENCOUNTER — Ambulatory Visit (INDEPENDENT_AMBULATORY_CARE_PROVIDER_SITE_OTHER): Payer: Medicare HMO | Admitting: *Deleted

## 2016-04-26 DIAGNOSIS — J309 Allergic rhinitis, unspecified: Secondary | ICD-10-CM

## 2016-04-26 NOTE — Telephone Encounter (Signed)
PATIENT CAME TO GET SHOT COPY OF LETTER GIVEN

## 2016-05-03 ENCOUNTER — Ambulatory Visit (INDEPENDENT_AMBULATORY_CARE_PROVIDER_SITE_OTHER): Payer: Medicare HMO

## 2016-05-03 DIAGNOSIS — J309 Allergic rhinitis, unspecified: Secondary | ICD-10-CM

## 2016-05-08 ENCOUNTER — Other Ambulatory Visit: Payer: Self-pay | Admitting: Allergy and Immunology

## 2016-05-09 ENCOUNTER — Ambulatory Visit (INDEPENDENT_AMBULATORY_CARE_PROVIDER_SITE_OTHER): Payer: Medicare HMO

## 2016-05-09 DIAGNOSIS — J309 Allergic rhinitis, unspecified: Secondary | ICD-10-CM | POA: Diagnosis not present

## 2016-05-24 ENCOUNTER — Ambulatory Visit (INDEPENDENT_AMBULATORY_CARE_PROVIDER_SITE_OTHER): Payer: Medicare HMO | Admitting: *Deleted

## 2016-05-24 DIAGNOSIS — J309 Allergic rhinitis, unspecified: Secondary | ICD-10-CM | POA: Diagnosis not present

## 2016-06-07 ENCOUNTER — Ambulatory Visit (INDEPENDENT_AMBULATORY_CARE_PROVIDER_SITE_OTHER): Payer: Medicare HMO | Admitting: *Deleted

## 2016-06-07 DIAGNOSIS — J309 Allergic rhinitis, unspecified: Secondary | ICD-10-CM

## 2016-06-14 ENCOUNTER — Ambulatory Visit (INDEPENDENT_AMBULATORY_CARE_PROVIDER_SITE_OTHER): Payer: Medicare HMO | Admitting: *Deleted

## 2016-06-14 DIAGNOSIS — J309 Allergic rhinitis, unspecified: Secondary | ICD-10-CM | POA: Diagnosis not present

## 2016-06-22 ENCOUNTER — Ambulatory Visit (INDEPENDENT_AMBULATORY_CARE_PROVIDER_SITE_OTHER): Payer: Medicare HMO

## 2016-06-22 DIAGNOSIS — J309 Allergic rhinitis, unspecified: Secondary | ICD-10-CM | POA: Diagnosis not present

## 2016-06-28 ENCOUNTER — Ambulatory Visit (INDEPENDENT_AMBULATORY_CARE_PROVIDER_SITE_OTHER): Payer: Medicare HMO

## 2016-06-28 DIAGNOSIS — J309 Allergic rhinitis, unspecified: Secondary | ICD-10-CM | POA: Diagnosis not present

## 2016-07-05 ENCOUNTER — Ambulatory Visit (INDEPENDENT_AMBULATORY_CARE_PROVIDER_SITE_OTHER): Payer: Medicare HMO | Admitting: *Deleted

## 2016-07-05 DIAGNOSIS — J309 Allergic rhinitis, unspecified: Secondary | ICD-10-CM

## 2016-07-08 ENCOUNTER — Encounter: Payer: Self-pay | Admitting: *Deleted

## 2016-07-08 NOTE — Progress Notes (Signed)
Maintenance vials made for Tree, Mold-CR & Grass-Weed-Mite-Cat-Dog.

## 2016-07-11 DIAGNOSIS — J301 Allergic rhinitis due to pollen: Secondary | ICD-10-CM | POA: Diagnosis not present

## 2016-07-12 DIAGNOSIS — J3089 Other allergic rhinitis: Secondary | ICD-10-CM | POA: Diagnosis not present

## 2016-07-13 DIAGNOSIS — J3081 Allergic rhinitis due to animal (cat) (dog) hair and dander: Secondary | ICD-10-CM | POA: Diagnosis not present

## 2016-07-21 ENCOUNTER — Ambulatory Visit (INDEPENDENT_AMBULATORY_CARE_PROVIDER_SITE_OTHER): Payer: Medicare HMO

## 2016-07-21 DIAGNOSIS — J309 Allergic rhinitis, unspecified: Secondary | ICD-10-CM | POA: Diagnosis not present

## 2016-08-02 ENCOUNTER — Ambulatory Visit (INDEPENDENT_AMBULATORY_CARE_PROVIDER_SITE_OTHER): Payer: Medicare HMO | Admitting: *Deleted

## 2016-08-02 DIAGNOSIS — J309 Allergic rhinitis, unspecified: Secondary | ICD-10-CM | POA: Diagnosis not present

## 2016-08-10 ENCOUNTER — Other Ambulatory Visit: Payer: Self-pay | Admitting: Allergy and Immunology

## 2016-08-16 ENCOUNTER — Ambulatory Visit (INDEPENDENT_AMBULATORY_CARE_PROVIDER_SITE_OTHER): Payer: Medicare HMO | Admitting: *Deleted

## 2016-08-16 ENCOUNTER — Telehealth: Payer: Self-pay | Admitting: Allergy and Immunology

## 2016-08-16 DIAGNOSIS — J309 Allergic rhinitis, unspecified: Secondary | ICD-10-CM

## 2016-08-16 NOTE — Telephone Encounter (Signed)
Patient walked in to make an appt so she could get her medication for Levocitizine refilled. She made an appt. For 09/13/16 with Dr. Verlin Fester. She uses [facility].

## 2016-08-17 ENCOUNTER — Other Ambulatory Visit: Payer: Self-pay

## 2016-08-17 MED ORDER — LEVOCETIRIZINE DIHYDROCHLORIDE 5 MG PO TABS: ORAL_TABLET | ORAL | 0 refills | Status: DC

## 2016-08-17 NOTE — Telephone Encounter (Signed)
SENT IN 1 REFILL

## 2016-08-25 ENCOUNTER — Ambulatory Visit (INDEPENDENT_AMBULATORY_CARE_PROVIDER_SITE_OTHER): Payer: Medicare HMO

## 2016-08-25 DIAGNOSIS — J309 Allergic rhinitis, unspecified: Secondary | ICD-10-CM

## 2016-08-29 ENCOUNTER — Other Ambulatory Visit: Payer: Self-pay

## 2016-08-29 ENCOUNTER — Telehealth: Payer: Self-pay | Admitting: Allergy and Immunology

## 2016-08-29 NOTE — Telephone Encounter (Signed)
Can we send her a prescription for Ventolin HFA and Arnuity Ellipta into [place] [facility] [id number]. This will be her new pharmacy. Can you please let her know when it has been sent to the pharmacy so she can call the pharmacy and let them know it is sent? Thanks

## 2016-08-30 NOTE — Telephone Encounter (Signed)
Called patient and advised needs to be seen for rx to go to [facility]  She advised that she has already been approved for patient assistance just needs rx sent to them.  Told her we can send next week when she is seen, although she not happy about have to come in for appt.

## 2016-09-06 ENCOUNTER — Ambulatory Visit (INDEPENDENT_AMBULATORY_CARE_PROVIDER_SITE_OTHER): Payer: Medicare HMO

## 2016-09-06 DIAGNOSIS — J309 Allergic rhinitis, unspecified: Secondary | ICD-10-CM | POA: Diagnosis not present

## 2016-09-12 ENCOUNTER — Ambulatory Visit (INDEPENDENT_AMBULATORY_CARE_PROVIDER_SITE_OTHER): Payer: Medicare HMO | Admitting: Allergy and Immunology

## 2016-09-12 ENCOUNTER — Encounter: Payer: Self-pay | Admitting: Allergy and Immunology

## 2016-09-12 VITALS — BP 122/68 | HR 68 | Temp 97.8°F | Resp 18

## 2016-09-12 DIAGNOSIS — J0100 Acute maxillary sinusitis, unspecified: Secondary | ICD-10-CM

## 2016-09-12 DIAGNOSIS — J3089 Other allergic rhinitis: Secondary | ICD-10-CM | POA: Diagnosis not present

## 2016-09-12 DIAGNOSIS — J019 Acute sinusitis, unspecified: Secondary | ICD-10-CM | POA: Insufficient documentation

## 2016-09-12 DIAGNOSIS — B9689 Other specified bacterial agents as the cause of diseases classified elsewhere: Secondary | ICD-10-CM | POA: Insufficient documentation

## 2016-09-12 DIAGNOSIS — J4541 Moderate persistent asthma with (acute) exacerbation: Secondary | ICD-10-CM

## 2016-09-12 MED ORDER — AZELASTINE HCL 0.15 % NA SOLN: 2.0000 | Freq: Two times a day (BID) | NASAL | 5 refills | Status: DC

## 2016-09-12 MED ORDER — PREDNISONE 1 MG PO TABS: 10.0000 mg | ORAL_TABLET | Freq: Every day | ORAL | Status: DC

## 2016-09-12 NOTE — Addendum Note (Signed)
Addended by: Hartley Barefoot on: 09/12/2016 02:28 PM   Modules accepted: Orders

## 2016-09-12 NOTE — Assessment & Plan Note (Signed)
   Prednisone has been provided (as above).  For thick post nasal drainage, add guaifenesin [phone] mg (Mucinex)  twice daily as needed with adequate hydration as discussed.  Nasal saline lavage (NeilMed) as needed has been recommended along with instructions for proper administration.  A prescription has been provided for azelastine nasal spray, 1-2 sprays per nostril 2 times daily as needed. Proper nasal spray technique has been discussed and demonstrated.   Continue fluticasone nasal spray, one spray per nostril twice a day.  The patient has been asked to contact me if her symptoms persist, progress, or if she becomes febrile.

## 2016-09-12 NOTE — Assessment & Plan Note (Signed)
   Continue appropriate allergen avoidance measures, aeroallergen immunotherapy, montelukast daily, and fluticasone nasal spray as needed.  A prescription has been provided for azelastine nasal spray (as above).

## 2016-09-12 NOTE — Progress Notes (Signed)
Follow-up Note  RE: Kimberly Lam MRN: [id number] DOB: 10/19/1946 Date of Office Visit: 09/12/2016  Primary care provider: Sandi Mariscal, MD Referring provider: Sandi Mariscal, MD  History of present illness: Kimberly Lam is a 70 y.o. female with persistent asthma and allergic rhinoconjunctivitis returns today for follow up. She was last seen in this clinic in December 2016.  She reports that recently she has "not been getting enough oxygen."  However, she is uncertain if this is due to her asthma or due to nasal congestion/sinus pressure.  She reports that over the past few/several week she has been experiencing increased nasal congestion, sinus pressure, postnasal drainage, sore throat, and sneezing.  She believes that these symptoms have been triggered by rapid weather changes, rain, and humidity.  She reports that the humidity triggers her lower respiratory symptoms.  She stopped using nasal saline irrigation a few weeks ago because he felt like the water was going into her ears.  She has not been experiencing fevers, chills, or discolored mucus production.  She needs refills on her allergy and asthma medications.  She is progressing through aeroallergen immunotherapy buildup injections without reported problems or complications.   Assessment and plan: Moderate persistent asthma  Prednisone has been provided, 20 mg x 4 days, 10 mg x1 day, then stop.  Continue Arnuity Ellipta 100 g, one inhalation daily.  During upper respiratory tract infections or asthma flares, she may increase to 1 inhalation twice a day until symptoms are returned baseline.  Continue montelukast 10 mg daily bedtime and albuterol HFA, 1-2 inhalations every 4-6 hours as needed.  The patient has been asked to contact me if her symptoms persist or progress. Otherwise, she may return for follow up in 4 months.  Acute sinusitis  Prednisone has been provided (as above).  For thick post nasal drainage, add  guaifenesin [phone] mg (Mucinex)  twice daily as needed with adequate hydration as discussed.  Nasal saline lavage (NeilMed) as needed has been recommended along with instructions for proper administration.  A prescription has been provided for azelastine nasal spray, 1-2 sprays per nostril 2 times daily as needed. Proper nasal spray technique has been discussed and demonstrated.   Continue fluticasone nasal spray, one spray per nostril twice a day.  The patient has been asked to contact me if her symptoms persist, progress, or if she becomes febrile.   Perennial and seasonal allergic rhinoconjunctivitis  Continue appropriate allergen avoidance measures, aeroallergen immunotherapy, montelukast daily, and fluticasone nasal spray as needed.  A prescription has been provided for azelastine nasal spray (as above).   Meds ordered this encounter  Medications  . predniSONE (DELTASONE) tablet 10 mg  . Azelastine HCl 0.15 % SOLN    Sig: Place 2 sprays into both nostrils 2 (two) times daily.    Dispense:  30 mL    Refill:  5    Diagnositics: Spirometry reveals an FVC of 2.56 L and an FEV1 of 1.71 L without significant post bronchodilator improvement.  Mild obstructive pattern without significant reversibility.  Please see scanned spirometry results for details.    Physical examination: Blood pressure 122/68, pulse 68, temperature 97.8 F (36.6 C), temperature source Oral, resp. rate 18, SpO2 97 %.  General: Alert, interactive, in no acute distress. HEENT: TMs pearly gray, turbinates edematous without discharge, post-pharynx moderately erythematous. Neck: Supple without lymphadenopathy. Lungs: Clear to auscultation without wheezing, rhonchi or rales. CV: Normal S1, S2 without murmurs. Skin: Warm and dry, without lesions or rashes.  The following portions of the patient's history were reviewed and updated as appropriate: allergies, current medications, past family history, past medical  history, past social history, past surgical history and problem list.    Medication List       Accurate as of 09/12/16  1:14 PM. Always use your most recent med list.          ACIDOPHILUS PROBIOTIC PO Take 1 tablet by mouth daily.   AEROCHAMBER PLUS inhaler Use as instructed with MDI   alendronate 70 MG tablet Commonly known as:  FOSAMAX Take 70 mg by mouth once a week. Take with a full glass of water on an empty stomach.   AMBULATORY NON FORMULARY MEDICATION Insulin Control Formula Take 1 tablet by mouth as needed   Azelastine HCl 0.15 % Soln Place 2 sprays into both nostrils 2 (two) times daily.   baclofen 10 MG tablet Commonly known as:  LIORESAL Take 1 tablet (10 mg total) by mouth 2 (two) times daily.   beclomethasone 40 MCG/ACT inhaler Commonly known as:  QVAR Inhale 2 puffs into the lungs 2 (two) times daily.   CALCIUM 600/VITAMIN D3 PO Take 1 tablet by mouth as needed.   CINNAMON PO Take 1 tablet by mouth as needed.   Cranberry 450 MG Caps Take 1 capsule by mouth as needed.   CYCLOBENZAPRINE HCL PO Take by mouth.   cycloSPORINE 0.05 % ophthalmic emulsion Commonly known as:  RESTASIS 1 drop 2 (two) times daily.   diclofenac 75 MG EC tablet Commonly known as:  VOLTAREN Take 1 tablet (75 mg total) by mouth 2 (two) times daily as needed.   ECHINACEA COMB/GOLDEN SEAL PO Take 1 tablet by mouth as needed. Reported on 12/16/2015   EPINEPHrine 0.3 mg/0.3 mL Soaj injection Commonly known as:  EPIPEN 2-PAK Inject 0.3 mLs (0.3 mg total) into the muscle once.   fexofenadine-pseudoephedrine 180-240 MG 24 hr tablet Commonly known as:  ALLEGRA-D 24 Take 1 tablet by mouth daily. Reported on 12/16/2015   Flaxseed Oil 1000 MG Caps Take 1 capsule by mouth as needed.   fluticasone 50 MCG/ACT nasal spray Commonly known as:  FLONASE USE 2 SPRAYS IN EACH NOSTRIL EVERY DAY   Fluticasone Furoate 100 MCG/ACT Aepb Commonly known as:  ARNUITY ELLIPTA Inhale 1 puff  into the lungs daily.   gabapentin 300 MG capsule Commonly known as:  NEURONTIN Take 1 capsule (300 mg total) by mouth 4 (four) times daily.   GREEN TEA PO Take by mouth as needed. Reported on 12/16/2015   ipratropium 0.06 % nasal spray Commonly known as:  ATROVENT 1-2 sprays each nostril 2 or 3 times a day as needed   LANTUS SOLOSTAR 100 UNIT/ML Solostar Pen Generic drug:  Insulin Glargine Inject into the skin. Inject 18-45 units at bedtime.   levocetirizine 5 MG tablet Commonly known as:  XYZAL TAKE ONE TABLET DAILY FOR RUNNY NOSE OR ITCHING.   Melatonin 5 MG Tabs Take 1 tablet by mouth as needed. Reported on 12/16/2015   metFORMIN 500 MG tablet Commonly known as:  GLUCOPHAGE Take by mouth 2 (two) times daily with a meal.   montelukast 10 MG tablet Commonly known as:  SINGULAIR Take 1 tablet by mouth daily.   OMEGA 3 PO Take 1 tablet by mouth as needed.   omeprazole 40 MG capsule Commonly known as:  PRILOSEC Take 40 mg by mouth daily.   [place] Grape Root Powd Take 1 tablet by mouth as needed. Reported on 12/16/2015  Potassium Gluconate 595 MG Caps Take 1 capsule by mouth as needed.   ramipril 2.5 MG capsule Commonly known as:  ALTACE Take 2.5 mg by mouth daily.   simvastatin 20 MG tablet Commonly known as:  ZOCOR Take 20 mg by mouth daily.   Stinging Nettle 2 % Powd Take 1 tablet by mouth as needed. Reported on 12/16/2015   TURMERIC PO Take 800 mg by mouth as needed. Reported on 12/16/2015   VENTOLIN HFA 108 (90 Base) MCG/ACT inhaler Generic drug:  albuterol INHALE 2 PUFFS EVERY SIX HOURS AS NEEDED FOR WHEEZING OR SHORTNESS OF BREATH.   Vitamin B-12 CR 1500 MCG Tbcr Take 1 tablet by mouth as needed.   vitamin C 1000 MG tablet Take 1,000 mg by mouth as needed.       Allergies  Allergen Reactions  . Peanut-Containing Drug Products Anaphylaxis  . Actos [Pioglitazone] Nausea Only  . Actos [Pioglitazone]   . Milk-Related Compounds Nausea And  Vomiting    Milk and cheese  . Peanuts [Peanut Oil] Swelling  . Soy Allergy Nausea Only    Pt states had allergy testing and was told allergic to soy-causes nausea but no other reactions  . Vicodin [Hydrocodone-Acetaminophen] Nausea Only    nausea  . Vicodin [Hydrocodone-Acetaminophen] Nausea Only   Review of systems: Review of systems negative except as noted in HPI / PMHx or noted below: Constitutional: Negative.  HENT: Negative.   Eyes: Negative.  Respiratory: Negative.   Cardiovascular: Negative.  Gastrointestinal: Negative.  Genitourinary: Negative.  Musculoskeletal: Negative.  Neurological: Negative.  Endo/Heme/Allergies: Negative.  Cutaneous: Negative.  Past Medical History:  Diagnosis Date  . Acid reflux   . Allergic rhinitis   . Anemia   . Asthma   . Bilateral sciatica   . Chronic bilateral low back pain   . Colon polyps   . Diabetes ([place])   . Diverticulosis   . DM type 2 (diabetes mellitus, type 2) ([place])   . Environmental allergies    flowers, pollen, trees  . Hypercholesterolemia   . Hyperlipidemia   . Hypertension   . Low back pain   . Migraine     Family History  Problem Relation Age of Onset  . Diabetes Mellitus II Brother     x 2  . Hyperlipidemia Brother   . Hypertension Brother   . Pancreatic cancer Brother   . Asthma Father   . Heart disease Maternal Grandmother     hardening of the arteries  . Heart murmur Daughter   . Prostate cancer Brother   . Glaucoma Brother   . Emphysema Father     Social History   Social History  . Marital status: Married    Spouse name: N/A  . Number of children: 2  . Years of education: 16   Occupational History  . retired   . Retired    Social History Main Topics  . Smoking status: Never Smoker  . Smokeless tobacco: Not on file  . Alcohol use No  . Drug use: No  . Sexual activity: Not on file   Other Topics Concern  . Not on file   Social History Narrative   ** Merged History Encounter **         ** Data from: 11/23/15 Enc Dept: [facility]   Married 1 son one daughter, relocated to [place] after living in [place] for many years. She is a Writer of [employer]. No caffeine.       **  Data from: 11/25/15 Enc Dept: [facility]   Lives at home with her husband and brother. Right-handed. No caffeine use.    I appreciate the opportunity to take part in Karinda's care. Please do not hesitate to contact me with questions.  Sincerely,   R. Edgar Frisk, MD

## 2016-09-12 NOTE — Assessment & Plan Note (Signed)
   Prednisone has been provided, 20 mg x 4 days, 10 mg x1 day, then stop.  Continue Arnuity Ellipta 100 g, one inhalation daily.  During upper respiratory tract infections or asthma flares, she may increase to 1 inhalation twice a day until symptoms are returned baseline.  Continue montelukast 10 mg daily bedtime and albuterol HFA, 1-2 inhalations every 4-6 hours as needed.  The patient has been asked to contact me if her symptoms persist or progress. Otherwise, she may return for follow up in 4 months.

## 2016-09-12 NOTE — Patient Instructions (Addendum)
Moderate persistent asthma  Prednisone has been provided, 20 mg x 4 days, 10 mg x1 day, then stop.  Continue Arnuity Ellipta 100 g, one inhalation daily.  During upper respiratory tract infections or asthma flares, she may increase to 1 inhalation twice a day until symptoms are returned baseline.  Continue montelukast 10 mg daily bedtime and albuterol HFA, 1-2 inhalations every 4-6 hours as needed.  The patient has been asked to contact me if her symptoms persist or progress. Otherwise, she may return for follow up in 4 months.  Acute sinusitis  Prednisone has been provided (as above).  For thick post nasal drainage, add guaifenesin [phone] mg (Mucinex)  twice daily as needed with adequate hydration as discussed.  Nasal saline lavage (NeilMed) as needed has been recommended along with instructions for proper administration.  A prescription has been provided for azelastine nasal spray, 1-2 sprays per nostril 2 times daily as needed. Proper nasal spray technique has been discussed and demonstrated.   Continue fluticasone nasal spray, one spray per nostril twice a day.  The patient has been asked to contact me if her symptoms persist, progress, or if she becomes febrile.   Perennial and seasonal allergic rhinoconjunctivitis  Continue appropriate allergen avoidance measures, aeroallergen immunotherapy, montelukast daily, and fluticasone nasal spray as needed.  A prescription has been provided for azelastine nasal spray (as above).   Return in about 4 months (around 01/12/2017), or if symptoms worsen or fail to improve.

## 2016-09-13 ENCOUNTER — Ambulatory Visit: Payer: Medicare HMO | Admitting: Allergy and Immunology

## 2016-09-14 ENCOUNTER — Telehealth: Payer: Self-pay | Admitting: Allergy and Immunology

## 2016-09-14 MED ORDER — FLUTICASONE FUROATE 100 MCG/ACT IN AEPB: 1.0000 | INHALATION_SPRAY | Freq: Every day | RESPIRATORY_TRACT | 5 refills | Status: DC

## 2016-09-14 MED ORDER — ALBUTEROL SULFATE HFA 108 (90 BASE) MCG/ACT IN AERS: 2.0000 | INHALATION_SPRAY | RESPIRATORY_TRACT | 3 refills | Status: DC | PRN

## 2016-09-14 NOTE — Telephone Encounter (Signed)
rx's were signed and faxed

## 2016-09-14 NOTE — Telephone Encounter (Signed)
Please print scripts and fax to number below. Thank you.

## 2016-09-14 NOTE — Telephone Encounter (Signed)
Can we please fax prescriptions for Ventolin HFA and Arnuity Ellipta.  Please fax them to [place] patient assistance program [phone]. They are waiting for the prescriptions.

## 2016-09-20 ENCOUNTER — Telehealth: Payer: Self-pay | Admitting: Allergy and Immunology

## 2016-09-20 ENCOUNTER — Ambulatory Visit (INDEPENDENT_AMBULATORY_CARE_PROVIDER_SITE_OTHER): Payer: Medicare HMO

## 2016-09-20 DIAGNOSIS — J309 Allergic rhinitis, unspecified: Secondary | ICD-10-CM | POA: Diagnosis not present

## 2016-09-20 NOTE — Telephone Encounter (Signed)
Please advise 

## 2016-09-20 NOTE — Telephone Encounter (Signed)
Pt called and is still sick and now she is having dizzy spells and head feels swimming. Sunday was a bad [phone] [address]

## 2016-09-20 NOTE — Telephone Encounter (Signed)
I spoke with Kimberly Lam at the front desk who spoke with the patient. This does not sound like an allergy related issue.

## 2016-09-21 NOTE — Telephone Encounter (Signed)
If she has seen him in the past 3 yrs no referral is needed. If it has been longer than 3 yrs send the referral. Thanks.

## 2016-09-21 NOTE — Telephone Encounter (Signed)
Pt has seen him within the last year. I informed her to call them and make an appointment to be seen.

## 2016-09-21 NOTE — Telephone Encounter (Signed)
Pt would like a referral to dr  Ernesto [facility] as she has seen him before.

## 2016-09-29 ENCOUNTER — Ambulatory Visit (INDEPENDENT_AMBULATORY_CARE_PROVIDER_SITE_OTHER): Payer: Medicare HMO | Admitting: *Deleted

## 2016-09-29 DIAGNOSIS — J309 Allergic rhinitis, unspecified: Secondary | ICD-10-CM

## 2016-10-04 ENCOUNTER — Telehealth: Payer: Self-pay | Admitting: Allergy and Immunology

## 2016-10-04 ENCOUNTER — Ambulatory Visit (INDEPENDENT_AMBULATORY_CARE_PROVIDER_SITE_OTHER): Payer: Medicare HMO | Admitting: *Deleted

## 2016-10-04 DIAGNOSIS — J0100 Acute maxillary sinusitis, unspecified: Secondary | ICD-10-CM

## 2016-10-04 DIAGNOSIS — J309 Allergic rhinitis, unspecified: Secondary | ICD-10-CM | POA: Diagnosis not present

## 2016-10-04 NOTE — Telephone Encounter (Signed)
L/M for patient to call office which rx 90 day reil to Kimberly Lam does she need?

## 2016-10-04 NOTE — Telephone Encounter (Signed)
Her insurance pharmacy needs more than one month supply on her refills. Can you please change and resend?  It needs to go to the [facility].

## 2016-10-10 MED ORDER — FLUTICASONE FUROATE 100 MCG/ACT IN AEPB: 1.0000 | INHALATION_SPRAY | Freq: Every day | RESPIRATORY_TRACT | 1 refills | Status: DC

## 2016-10-10 MED ORDER — AZELASTINE HCL 0.15 % NA SOLN: 2.0000 | Freq: Two times a day (BID) | NASAL | 1 refills | Status: DC

## 2016-10-10 MED ORDER — MONTELUKAST SODIUM 10 MG PO TABS
10.0000 mg | ORAL_TABLET | Freq: Every day | ORAL | 1 refills | Status: DC
Start: 2016-10-10 — End: 2017-01-18

## 2016-10-10 MED ORDER — ALBUTEROL SULFATE HFA 108 (90 BASE) MCG/ACT IN AERS: 2.0000 | INHALATION_SPRAY | RESPIRATORY_TRACT | 1 refills | Status: DC | PRN

## 2016-10-10 MED ORDER — FLUTICASONE PROPIONATE 50 MCG/ACT NA SUSP: 2.0000 | Freq: Every day | NASAL | 1 refills | Status: DC

## 2016-10-10 NOTE — Telephone Encounter (Signed)
Resent all medications as 90 day supply to [facility].

## 2016-10-10 NOTE — Telephone Encounter (Signed)
Sent all scripts as 90 day supply to [facility].

## 2016-10-10 NOTE — Addendum Note (Signed)
Addended by: Angelica Ran on: 10/10/2016 02:51 PM   Modules accepted: Orders

## 2016-10-18 ENCOUNTER — Ambulatory Visit (INDEPENDENT_AMBULATORY_CARE_PROVIDER_SITE_OTHER): Payer: Medicare HMO

## 2016-10-18 DIAGNOSIS — J309 Allergic rhinitis, unspecified: Secondary | ICD-10-CM | POA: Diagnosis not present

## 2016-10-26 ENCOUNTER — Other Ambulatory Visit: Payer: Self-pay | Admitting: Allergy and Immunology

## 2016-11-01 ENCOUNTER — Ambulatory Visit (INDEPENDENT_AMBULATORY_CARE_PROVIDER_SITE_OTHER): Payer: Medicare HMO | Admitting: *Deleted

## 2016-11-01 DIAGNOSIS — J309 Allergic rhinitis, unspecified: Secondary | ICD-10-CM

## 2016-11-07 ENCOUNTER — Encounter: Payer: Self-pay | Admitting: Podiatry

## 2016-11-07 ENCOUNTER — Ambulatory Visit (INDEPENDENT_AMBULATORY_CARE_PROVIDER_SITE_OTHER): Payer: Medicare HMO | Admitting: Podiatry

## 2016-11-07 DIAGNOSIS — M2041 Other hammer toe(s) (acquired), right foot: Secondary | ICD-10-CM

## 2016-11-07 DIAGNOSIS — M79675 Pain in left toe(s): Secondary | ICD-10-CM | POA: Diagnosis not present

## 2016-11-07 DIAGNOSIS — M2042 Other hammer toe(s) (acquired), left foot: Secondary | ICD-10-CM | POA: Diagnosis not present

## 2016-11-07 DIAGNOSIS — M79674 Pain in right toe(s): Secondary | ICD-10-CM

## 2016-11-07 DIAGNOSIS — B351 Tinea unguium: Secondary | ICD-10-CM

## 2016-11-07 DIAGNOSIS — E1149 Type 2 diabetes mellitus with other diabetic neurological complication: Secondary | ICD-10-CM | POA: Diagnosis not present

## 2016-11-07 NOTE — Patient Instructions (Signed)

## 2016-11-07 NOTE — Progress Notes (Signed)
   Subjective:    Patient ID: Kimberly Lam, female    DOB: [birth year], 70 y.o.   MRN: [id number]  HPI  70 year old female presents the office with concerns of thick, painful, elongated toenails that she cannot trim herself. She denies any claudication symptoms. She has some intermittent numbness and tingling to her toesand the right side which is been ongoing and not new. Last A1c was 6.2. She also states her fifth toes curl inwards and she does that she is walking on her toes and they are intermittently painful. Denies any recent injury or trauma. No swelling or redness. No other complaints at this time.  Review of Systems  Constitutional:       Sweating   HENT: Positive for hearing loss, sinus pressure and sneezing.        Ringing in ears  Eyes: Positive for itching.  Allergic/Immunologic: Positive for food allergies.  All other systems reviewed and are negative.      Objective:   Physical Exam General: AAO x3, NAD  Dermatological: Nails are hypertrophic, dystrophic, brittle, discolored, elongated 10. No surrounding redness or drainage. Tenderness nails 1-5 bilaterally. No open lesions or pre-ulcerative lesions are identified today.  Vascular: Dorsalis Pedis artery and Posterior Tibial artery pedal pulses are 2/4 bilateral with immedate capillary fill time. There is no pain with calf compression, swelling, warmth, erythema.   Neruologic: Grossly intact via light touch bilateral. Vibratory intact via tuning fork bilateral. Protective threshold with Semmes Wienstein monofilament intact to all pedal sites bilateral. Subjectively there is some numbness and tingling to the toes have a mild and his been ongoing.   Musculoskeletal: Adductovarus fifth toes bilaterally. Hammertoes are present. There is no other areas of tenderness bilaterally. There is no pain of the toes today other than the toenails. Muscular strength 5/5 in all groups tested bilateral.  Gait: Unassisted,  Nonantalgic.      Assessment & Plan:  70 year old female with symptomatic onychomycosis, possible early neuropathy. -Treatment options discussed including all alternatives, risks, and complications -Etiology of symptoms were discussed -Nails debrided 10 without complications or bleeding. Discussed treatment options for onychomycosis but she wishes to hold off today. If she desires treatment we will start topical treatment. -Offloading pad to the fifth toes -She also is requesting diabetic shoes paperwork was completed today for precertification of them. -Daily foot inspection -Follow-up in 3 months or sooner if needed.  Celesta Gentile, DPM

## 2016-11-16 ENCOUNTER — Ambulatory Visit (INDEPENDENT_AMBULATORY_CARE_PROVIDER_SITE_OTHER): Payer: Medicare HMO | Admitting: *Deleted

## 2016-11-16 DIAGNOSIS — J309 Allergic rhinitis, unspecified: Secondary | ICD-10-CM

## 2016-11-28 ENCOUNTER — Ambulatory Visit: Payer: Self-pay | Admitting: Internal Medicine

## 2016-11-28 DIAGNOSIS — J301 Allergic rhinitis due to pollen: Secondary | ICD-10-CM | POA: Diagnosis not present

## 2016-11-29 ENCOUNTER — Ambulatory Visit (INDEPENDENT_AMBULATORY_CARE_PROVIDER_SITE_OTHER): Payer: Medicare HMO | Admitting: *Deleted

## 2016-11-29 DIAGNOSIS — J309 Allergic rhinitis, unspecified: Secondary | ICD-10-CM | POA: Diagnosis not present

## 2016-11-30 DIAGNOSIS — J302 Other seasonal allergic rhinitis: Secondary | ICD-10-CM | POA: Diagnosis not present

## 2016-12-01 DIAGNOSIS — J3089 Other allergic rhinitis: Secondary | ICD-10-CM | POA: Diagnosis not present

## 2016-12-14 ENCOUNTER — Ambulatory Visit (INDEPENDENT_AMBULATORY_CARE_PROVIDER_SITE_OTHER): Payer: Medicare HMO | Admitting: *Deleted

## 2016-12-14 DIAGNOSIS — J309 Allergic rhinitis, unspecified: Secondary | ICD-10-CM | POA: Diagnosis not present

## 2016-12-20 ENCOUNTER — Ambulatory Visit (INDEPENDENT_AMBULATORY_CARE_PROVIDER_SITE_OTHER): Payer: Medicare Other | Admitting: *Deleted

## 2016-12-20 DIAGNOSIS — J309 Allergic rhinitis, unspecified: Secondary | ICD-10-CM

## 2016-12-27 ENCOUNTER — Telehealth: Payer: Self-pay | Admitting: Allergy and Immunology

## 2016-12-27 ENCOUNTER — Ambulatory Visit (INDEPENDENT_AMBULATORY_CARE_PROVIDER_SITE_OTHER): Payer: Medicare Other | Admitting: *Deleted

## 2016-12-27 DIAGNOSIS — J309 Allergic rhinitis, unspecified: Secondary | ICD-10-CM | POA: Diagnosis not present

## 2016-12-27 NOTE — Telephone Encounter (Signed)
Please advise 

## 2016-12-27 NOTE — Telephone Encounter (Signed)
Does her insurance cover [facility] or Symbicort?

## 2016-12-27 NOTE — Telephone Encounter (Signed)
Patient is on Arnuity Ellipta and she said it makes her throat constantly sore. She would like to know if there is an alternative. [facility] [address].

## 2016-12-28 NOTE — Telephone Encounter (Signed)
Does her insurance cover Qvar or Flovent?

## 2016-12-28 NOTE — Telephone Encounter (Signed)
Yes it covers dulera

## 2017-01-02 NOTE — Telephone Encounter (Signed)
[place] - per Stormy/CSR, pt's policy ended 0000000.  Clld pt - Pt advsd she gave new [facility] Medicare card to lady at desk when she came in for her immunotherapy injection on 12/27/2016. Advsd no copies are in the system. Pt advsd she will come in again on tomorrow, Tuesday, afternoon to have copies made and scanned into her chart. I apologized for the inconvenienced and thanked her for bringing the card back in for [place] to scan.   Advsd pt once new insurance information is received we can process her request for a different medication. Pt stated she understood.

## 2017-01-03 ENCOUNTER — Other Ambulatory Visit: Payer: Self-pay | Admitting: *Deleted

## 2017-01-03 ENCOUNTER — Other Ambulatory Visit: Payer: Self-pay

## 2017-01-03 DIAGNOSIS — J4540 Moderate persistent asthma, uncomplicated: Secondary | ICD-10-CM

## 2017-01-03 MED ORDER — BECLOMETHASONE DIPROPIONATE 40 MCG/ACT IN AERS: 2.0000 | INHALATION_SPRAY | Freq: Two times a day (BID) | RESPIRATORY_TRACT | 2 refills | Status: DC

## 2017-01-03 MED ORDER — OLOPATADINE HCL 0.1 % OP SOLN: 1.0000 [drp] | Freq: Two times a day (BID) | OPHTHALMIC | 0 refills | Status: DC

## 2017-01-03 NOTE — Telephone Encounter (Signed)
Per provider refilled Qvar 40 mcg due to new insurance.  Clld pt - advsd of new medication.

## 2017-01-03 NOTE — Telephone Encounter (Signed)
Clld pt - advsd of refill on Qvar 40 mcg due to new insurance.

## 2017-01-03 NOTE — Telephone Encounter (Signed)
Noted and agree. 

## 2017-01-03 NOTE — Telephone Encounter (Signed)
Qvar, Asmanex Twisthaler, Alvesco are all Tier 1. Please advise and thank you.

## 2017-01-09 ENCOUNTER — Ambulatory Visit (INDEPENDENT_AMBULATORY_CARE_PROVIDER_SITE_OTHER): Payer: Medicare Other

## 2017-01-09 DIAGNOSIS — J309 Allergic rhinitis, unspecified: Secondary | ICD-10-CM

## 2017-01-16 ENCOUNTER — Ambulatory Visit (INDEPENDENT_AMBULATORY_CARE_PROVIDER_SITE_OTHER): Payer: Medicare Other | Admitting: Allergy and Immunology

## 2017-01-16 ENCOUNTER — Encounter: Payer: Self-pay | Admitting: Allergy and Immunology

## 2017-01-16 ENCOUNTER — Telehealth: Payer: Self-pay | Admitting: Allergy and Immunology

## 2017-01-16 VITALS — BP 140/68 | HR 80 | Resp 16 | Wt 147.2 lb

## 2017-01-16 DIAGNOSIS — J3089 Other allergic rhinitis: Secondary | ICD-10-CM

## 2017-01-16 DIAGNOSIS — T7800XD Anaphylactic reaction due to unspecified food, subsequent encounter: Secondary | ICD-10-CM | POA: Diagnosis not present

## 2017-01-16 DIAGNOSIS — J4540 Moderate persistent asthma, uncomplicated: Secondary | ICD-10-CM | POA: Diagnosis not present

## 2017-01-16 DIAGNOSIS — T7800XA Anaphylactic reaction due to unspecified food, initial encounter: Secondary | ICD-10-CM | POA: Insufficient documentation

## 2017-01-16 MED ORDER — ALBUTEROL SULFATE HFA 108 (90 BASE) MCG/ACT IN AERS: 2.0000 | INHALATION_SPRAY | RESPIRATORY_TRACT | 3 refills | Status: DC | PRN

## 2017-01-16 MED ORDER — FLUTICASONE PROPIONATE HFA 110 MCG/ACT IN AERO: 2.0000 | INHALATION_SPRAY | Freq: Two times a day (BID) | RESPIRATORY_TRACT | 2 refills | Status: DC

## 2017-01-16 NOTE — Telephone Encounter (Signed)
Pt called and said that the nurse wanted her to get and see if she had a spacer at home but she does not have one. [phone].

## 2017-01-16 NOTE — Patient Instructions (Signed)
Moderate persistent asthma  Given the side effects with the dry powder inhaler, she will be switched to Flovent HFA 110 g, 2 inhalations twice a day.  A prescription has been provided.  To maximize pulmonary deposition, a spacer has been provided along with instructions for its proper administration with an HFA inhaler.  Continue montelukast 10 mg daily at bedtime and albuterol HFA, 1-2 inhalations every 4-6 hours as needed.  At her request, a letter has been scripted to her insurance in an attempt to decrease her co-pays for prescriptions as these medications are necessary to keep her asthma under control.  Subjective and objective measures of pulmonary function will be followed and the treatment plan will be adjusted accordingly.  Perennial and seasonal allergic rhinoconjunctivitis  Continue appropriate allergen avoidance measures, aeroallergen immunotherapy, montelukast daily, and azelastine nasal spray.  I have also recommended nasal saline spray (i.e., Simply Saline) or nasal saline lavage (i.e., NeilMed) as needed prior to medicated nasal sprays.  Medications will be decreased or discontinued as symptom relief from immunotherapy becomes evident.  Food allergy  Continue careful avoidance of peanuts, cow's milk, and soybean and have access to epinephrine autoinjector 2 pack in case of accidental ingestion.  Food allergy action plan is in place.   Return in about 4 months (around 05/16/2017), or if symptoms worsen or fail to improve.

## 2017-01-16 NOTE — Assessment & Plan Note (Signed)
   Continue appropriate allergen avoidance measures, aeroallergen immunotherapy, montelukast daily, and azelastine nasal spray.  I have also recommended nasal saline spray (i.e., Simply Saline) or nasal saline lavage (i.e., NeilMed) as needed prior to medicated nasal sprays.  Medications will be decreased or discontinued as symptom relief from immunotherapy becomes evident.

## 2017-01-16 NOTE — Assessment & Plan Note (Addendum)
   Continue careful avoidance of peanuts, cow's milk, and soybean and have access to epinephrine autoinjector 2 pack in case of accidental ingestion.  Food allergy action plan is in place.

## 2017-01-16 NOTE — Assessment & Plan Note (Signed)
   Given the side effects with the dry powder inhaler, she will be switched to Flovent HFA 110 g, 2 inhalations twice a day.  A prescription has been provided.  To maximize pulmonary deposition, a spacer has been provided along with instructions for its proper administration with an HFA inhaler.  Continue montelukast 10 mg daily at bedtime and albuterol HFA, 1-2 inhalations every 4-6 hours as needed.  At her request, a letter has been scripted to her insurance in an attempt to decrease her co-pays for prescriptions as these medications are necessary to keep her asthma under control.  Subjective and objective measures of pulmonary function will be followed and the treatment plan will be adjusted accordingly.

## 2017-01-16 NOTE — Progress Notes (Signed)
Follow-up Note  RE: Kimberly Lam MRN: [id number] DOB: 04-01-1946 Date of Office Visit: 01/16/2017  Primary care provider: Sandi Mariscal, MD Referring provider: Sandi Mariscal, MD  History of present illness: Kimberly Lam is a 71 y.o. female with persistent asthma, allergic rhinoconjunctivitis, and food allergies presenting today for follow up.  She was last seen in this clinic in September 2017.  She reports that over this past week she has been experiencing some mild dyspnea and chest tightness.  These symptoms started after she had discontinued Arnuity approximately one week ago because of irritated throat.  She would believes that Arnuity had controlled her asthma, however was a unable to control due to the side effect.  In addition, she states that she is unable to afford all of the medications he is taking.  She is currently pending $45 per month for Arnuity, Ventolin, as well as medications unrelated to her asthma and allergies.  She is receiving aeroallergen immunotherapy injections without problems or complications.  She complains of occasional nasal congestion despite compliance with azelastine.  However, she admits that she is not using nasal saline prior to the azelastine.  She avoids peanuts, cow's milk, and soy and has access to epinephrine autoinjectors in case of accidental ingestion.     Assessment and plan: Moderate persistent asthma  Given the side effects with the dry powder inhaler, she will be switched to Flovent HFA 110 g, 2 inhalations twice a day.  A prescription has been provided.  To maximize pulmonary deposition, a spacer has been provided along with instructions for its proper administration with an HFA inhaler.  Continue montelukast 10 mg daily at bedtime and albuterol HFA, 1-2 inhalations every 4-6 hours as needed.  At her request, a letter has been scripted to her insurance in an attempt to decrease her co-pays for prescriptions as these medications are  necessary to keep her asthma under control.  Subjective and objective measures of pulmonary function will be followed and the treatment plan will be adjusted accordingly.  Perennial and seasonal allergic rhinoconjunctivitis  Continue appropriate allergen avoidance measures, aeroallergen immunotherapy, montelukast daily, and azelastine nasal spray.  I have also recommended nasal saline spray (i.e., Simply Saline) or nasal saline lavage (i.e., NeilMed) as needed prior to medicated nasal sprays.  Medications will be decreased or discontinued as symptom relief from immunotherapy becomes evident.  Food allergy  Continue careful avoidance of peanuts, cow's milk, and soybean and have access to epinephrine autoinjector 2 pack in case of accidental ingestion.  Food allergy action plan is in place.   Diagnostics: Spirometry:  Normal with an FEV1 of 88% predicted.  Please see scanned spirometry results for details.    Physical examination: Blood pressure 140/68, pulse 80, resp. rate 16, weight 147 lb 3.2 oz (66.8 kg).  General: Alert, interactive, in no acute distress. HEENT: TMs pearly gray, turbinates moderately edematous without discharge, post-pharynx mildly erythematous. Neck: Supple without lymphadenopathy. Lungs: Clear to auscultation without wheezing, rhonchi or rales. CV: Normal S1, S2 without murmurs. Skin: Warm and dry, without lesions or rashes.  The following portions of the patient's history were reviewed and updated as appropriate: allergies, current medications, past family history, past medical history, past social history, past surgical history and problem list.  Allergies as of 01/16/2017      Reactions   Peanut-containing Drug Products Anaphylaxis   Actos [pioglitazone] Nausea Only   Milk-related Compounds Nausea And Vomiting   Milk and cheese   Soy Allergy Nausea  Only   Pt states had allergy testing and was told allergic to soy-causes nausea but no other reactions     Vicodin [hydrocodone-acetaminophen] Nausea Only   nausea      Medication List       Accurate as of 01/16/17 12:30 PM. Always use your most recent med list.          ACIDOPHILUS PROBIOTIC PO Take 1 tablet by mouth daily.   AEROCHAMBER PLUS inhaler Use as instructed with MDI   albuterol 108 (90 Base) MCG/ACT inhaler Commonly known as:  VENTOLIN HFA Inhale 2 puffs into the lungs every 4 (four) hours as needed for wheezing or shortness of breath.   albuterol 108 (90 Base) MCG/ACT inhaler Commonly known as:  PROAIR HFA Inhale 2 puffs into the lungs every 4 (four) hours as needed for wheezing or shortness of breath.   alendronate 70 MG tablet Commonly known as:  FOSAMAX Take 70 mg by mouth once a week. Take with a full glass of water on an empty stomach.   AMBULATORY NON FORMULARY MEDICATION Insulin Control Formula Take 1 tablet by mouth as needed   Azelastine HCl 0.15 % Soln Place 2 sprays into both nostrils 2 (two) times daily.   baclofen 10 MG tablet Commonly known as:  LIORESAL Take 1 tablet (10 mg total) by mouth 2 (two) times daily.   beclomethasone 40 MCG/ACT inhaler Commonly known as:  QVAR Inhale 2 puffs into the lungs 2 (two) times daily.   cholecalciferol 1000 units tablet Commonly known as:  VITAMIN D Take 1,000 Units by mouth daily.   CINNAMON PO Take 1 tablet by mouth as needed.   Cranberry 450 MG Caps Take 1 capsule by mouth as needed.   cycloSPORINE 0.05 % ophthalmic emulsion Commonly known as:  RESTASIS 1 drop 2 (two) times daily.   diclofenac 75 MG EC tablet Commonly known as:  VOLTAREN Take 1 tablet (75 mg total) by mouth 2 (two) times daily as needed.   ECHINACEA COMB/GOLDEN SEAL PO Take 1 tablet by mouth as needed. Reported on 12/16/2015   fexofenadine-pseudoephedrine 180-240 MG 24 hr tablet Commonly known as:  ALLEGRA-D 24 Take 1 tablet by mouth daily. Reported on 12/16/2015   fluticasone 110 MCG/ACT inhaler Commonly known as:   FLOVENT HFA Inhale 2 puffs into the lungs 2 (two) times daily.   fluticasone 50 MCG/ACT nasal spray Commonly known as:  FLONASE Place 2 sprays into both nostrils daily.   Fluticasone Furoate 100 MCG/ACT Aepb Commonly known as:  ARNUITY ELLIPTA Inhale 1 puff into the lungs daily.   gabapentin 300 MG capsule Commonly known as:  NEURONTIN Take 1 capsule (300 mg total) by mouth 4 (four) times daily.   GREEN TEA PO Take by mouth as needed. Reported on 12/16/2015   LANTUS SOLOSTAR 100 UNIT/ML Solostar Pen Generic drug:  Insulin Glargine Inject into the skin. Inject 18-45 units at bedtime.   levocetirizine 5 MG tablet Commonly known as:  XYZAL TAKE 1 TABLET EVERY EVENING   Melatonin 5 MG Tabs Take 1 tablet by mouth as needed. Reported on 12/16/2015   metFORMIN 500 MG tablet Commonly known as:  GLUCOPHAGE Take by mouth 2 (two) times daily with a meal.   montelukast 10 MG tablet Commonly known as:  SINGULAIR Take 1 tablet (10 mg total) by mouth at bedtime.   olopatadine 0.1 % ophthalmic solution Commonly known as:  PATANOL Place 1 drop into both eyes 2 (two) times daily.   OMEGA 3  PO Take 1 tablet by mouth as needed.   omega-3 acid ethyl esters 1 g capsule Commonly known as:  LOVAZA Take by mouth 2 (two) times daily.   omeprazole 40 MG capsule Commonly known as:  PRILOSEC Take 40 mg by mouth daily.   Potassium Gluconate 595 MG Caps Take 1 capsule by mouth as needed.   ramipril 2.5 MG capsule Commonly known as:  ALTACE Take 2.5 mg by mouth daily.   simvastatin 20 MG tablet Commonly known as:  ZOCOR Take 20 mg by mouth daily.   Stinging Nettle 2 % Powd Take 1 tablet by mouth as needed. Reported on 12/16/2015   TURMERIC PO Take 800 mg by mouth as needed. Reported on 12/16/2015   Vitamin B-12 CR 1500 MCG Tbcr Take 1 tablet by mouth as needed.   vitamin C 1000 MG tablet Take 1,000 mg by mouth as needed.       Allergies  Allergen Reactions  .  Peanut-Containing Drug Products Anaphylaxis  . Actos [Pioglitazone] Nausea Only  . Milk-Related Compounds Nausea And Vomiting    Milk and cheese  . Soy Allergy Nausea Only    Pt states had allergy testing and was told allergic to soy-causes nausea but no other reactions  . Vicodin [Hydrocodone-Acetaminophen] Nausea Only    nausea    Review of systems: Review of systems negative except as noted in HPI / PMHx or noted below: Constitutional: Negative.  HENT: Negative.   Eyes: Negative.  Respiratory: Negative.   Cardiovascular: Negative.  Gastrointestinal: Negative.  Genitourinary: Negative.  Musculoskeletal: Negative.  Neurological: Negative.  Endo/Heme/Allergies: Negative.  Cutaneous: Negative.  Past Medical History:  Diagnosis Date  . Acid reflux   . Allergic rhinitis   . Anemia   . Asthma   . Bilateral sciatica   . Chronic bilateral low back pain   . Colon polyps   . Diabetes ([place])   . Diverticulosis   . DM type 2 (diabetes mellitus, type 2) ([place])   . Environmental allergies    flowers, pollen, trees  . Hypercholesterolemia   . Hyperlipidemia   . Hypertension   . Low back pain   . Migraine     Family History  Problem Relation Age of Onset  . Diabetes Mellitus II Brother     x 2  . Hyperlipidemia Brother   . Hypertension Brother   . Pancreatic cancer Brother   . Asthma Father   . Heart disease Maternal Grandmother     hardening of the arteries  . Heart murmur Daughter   . Prostate cancer Brother   . Glaucoma Brother   . Emphysema Father     Social History   Social History  . Marital status: Married    Spouse name: N/A  . Number of children: 2  . Years of education: 16   Occupational History  . retired   . Retired    Social History Main Topics  . Smoking status: Never Smoker  . Smokeless tobacco: Never Used  . Alcohol use No  . Drug use: No  . Sexual activity: Not on file   Other Topics Concern  . Not on file   Social History Narrative     ** Merged History Encounter **       ** Data from: 11/23/15 Enc Dept: [facility]   Married 1 son one daughter, relocated to [place] after living in [place] for many years. She is a Writer of [employer].  No caffeine.       ** Data from: 11/25/15 Enc Dept: [facility]   Lives at home with her husband and brother. Right-handed. No caffeine use.    I appreciate the opportunity to take part in Jeanell's care. Please do not hesitate to contact me with questions.  Sincerely,   R. Edgar Frisk, MD

## 2017-01-17 ENCOUNTER — Other Ambulatory Visit: Payer: Self-pay

## 2017-01-17 ENCOUNTER — Telehealth: Payer: Self-pay

## 2017-01-17 MED ORDER — FLUTICASONE PROPIONATE HFA 110 MCG/ACT IN AERO: 2.0000 | INHALATION_SPRAY | Freq: Two times a day (BID) | RESPIRATORY_TRACT | 5 refills | Status: DC

## 2017-01-17 NOTE — Telephone Encounter (Signed)
Spoke with pt and will leave spacer and paperwork at front desk for signature and pick up

## 2017-01-17 NOTE — Telephone Encounter (Signed)
Sent in flovent for pt

## 2017-01-17 NOTE — Telephone Encounter (Signed)
Try Flovent 110 mcg, 2 puff bid via spacer device.

## 2017-01-17 NOTE — Telephone Encounter (Signed)
Pt called stating that the qvar caused inflammation and made her feel like her throat was closing. She also stated the side affects of qvar are too risky for her to take it. Is there another inhaler you could prescribe her?  Please advise

## 2017-01-18 ENCOUNTER — Other Ambulatory Visit: Payer: Self-pay | Admitting: Allergy

## 2017-01-18 DIAGNOSIS — J0100 Acute maxillary sinusitis, unspecified: Secondary | ICD-10-CM

## 2017-01-18 MED ORDER — AZELASTINE HCL 0.15 % NA SOLN: 2.0000 | Freq: Two times a day (BID) | NASAL | 0 refills | Status: DC

## 2017-01-18 MED ORDER — ALBUTEROL SULFATE HFA 108 (90 BASE) MCG/ACT IN AERS: 2.0000 | INHALATION_SPRAY | RESPIRATORY_TRACT | 0 refills | Status: DC | PRN

## 2017-01-18 MED ORDER — MONTELUKAST SODIUM 10 MG PO TABS: 10.0000 mg | ORAL_TABLET | Freq: Every day | ORAL | 0 refills | Status: DC

## 2017-01-18 MED ORDER — LEVOCETIRIZINE DIHYDROCHLORIDE 5 MG PO TABS: 5.0000 mg | ORAL_TABLET | Freq: Every evening | ORAL | 0 refills | Status: DC

## 2017-01-18 MED ORDER — FLUTICASONE FUROATE 100 MCG/ACT IN AEPB: 1.0000 | INHALATION_SPRAY | Freq: Every day | RESPIRATORY_TRACT | 0 refills | Status: DC

## 2017-01-18 MED ORDER — FLUTICASONE PROPIONATE 50 MCG/ACT NA SUSP: 1.0000 | Freq: Two times a day (BID) | NASAL | 0 refills | Status: DC

## 2017-01-23 ENCOUNTER — Other Ambulatory Visit: Payer: Self-pay | Admitting: *Deleted

## 2017-01-23 MED ORDER — GABAPENTIN 300 MG PO CAPS: 300.0000 mg | ORAL_CAPSULE | Freq: Four times a day (QID) | ORAL | 0 refills | Status: DC

## 2017-01-24 ENCOUNTER — Other Ambulatory Visit: Payer: Self-pay | Admitting: *Deleted

## 2017-01-24 ENCOUNTER — Ambulatory Visit (INDEPENDENT_AMBULATORY_CARE_PROVIDER_SITE_OTHER): Payer: Medicare Other

## 2017-01-24 DIAGNOSIS — J309 Allergic rhinitis, unspecified: Secondary | ICD-10-CM | POA: Diagnosis not present

## 2017-01-25 ENCOUNTER — Other Ambulatory Visit: Payer: Self-pay | Admitting: Allergy and Immunology

## 2017-01-25 ENCOUNTER — Other Ambulatory Visit: Payer: Self-pay | Admitting: Neurology

## 2017-02-06 ENCOUNTER — Ambulatory Visit: Payer: Medicare HMO | Admitting: Podiatry

## 2017-02-07 ENCOUNTER — Ambulatory Visit (INDEPENDENT_AMBULATORY_CARE_PROVIDER_SITE_OTHER): Payer: Medicare Other

## 2017-02-07 DIAGNOSIS — J309 Allergic rhinitis, unspecified: Secondary | ICD-10-CM

## 2017-02-09 ENCOUNTER — Other Ambulatory Visit: Payer: Self-pay

## 2017-02-09 DIAGNOSIS — J4540 Moderate persistent asthma, uncomplicated: Secondary | ICD-10-CM

## 2017-02-09 MED ORDER — FLUTICASONE PROPIONATE HFA 110 MCG/ACT IN AERO: 2.0000 | INHALATION_SPRAY | Freq: Two times a day (BID) | RESPIRATORY_TRACT | 1 refills | Status: DC

## 2017-02-21 ENCOUNTER — Telehealth: Payer: Self-pay | Admitting: Allergy and Immunology

## 2017-02-21 ENCOUNTER — Ambulatory Visit (INDEPENDENT_AMBULATORY_CARE_PROVIDER_SITE_OTHER): Payer: Medicare Other | Admitting: *Deleted

## 2017-02-21 DIAGNOSIS — J309 Allergic rhinitis, unspecified: Secondary | ICD-10-CM

## 2017-02-21 NOTE — Telephone Encounter (Signed)
Patient made aware of change from Qvar to Flovent. Advised no samples at this time, but will make a note that she needs them.

## 2017-02-21 NOTE — Telephone Encounter (Signed)
Called patient left message to call back. We need to inform patient that we do not have any Proair samples.

## 2017-02-21 NOTE — Telephone Encounter (Signed)
[facility] is going to check to see if they have a proair, and bring it to the office. Call patient when we get a proair in office, per [facility]

## 2017-02-21 NOTE — Telephone Encounter (Signed)
Pt called and wanted to know if we had any samples of proair that she could get. [phone].

## 2017-02-27 ENCOUNTER — Ambulatory Visit: Payer: Self-pay | Admitting: Neurology

## 2017-03-07 ENCOUNTER — Ambulatory Visit (INDEPENDENT_AMBULATORY_CARE_PROVIDER_SITE_OTHER): Payer: Medicare Other

## 2017-03-07 DIAGNOSIS — J309 Allergic rhinitis, unspecified: Secondary | ICD-10-CM | POA: Diagnosis not present

## 2017-03-08 ENCOUNTER — Other Ambulatory Visit: Payer: Self-pay | Admitting: Allergy and Immunology

## 2017-03-09 DIAGNOSIS — J301 Allergic rhinitis due to pollen: Secondary | ICD-10-CM | POA: Diagnosis not present

## 2017-03-10 DIAGNOSIS — J3089 Other allergic rhinitis: Secondary | ICD-10-CM | POA: Diagnosis not present

## 2017-03-15 ENCOUNTER — Ambulatory Visit: Payer: Self-pay

## 2017-03-21 ENCOUNTER — Ambulatory Visit (INDEPENDENT_AMBULATORY_CARE_PROVIDER_SITE_OTHER): Payer: Medicare Other | Admitting: *Deleted

## 2017-03-21 DIAGNOSIS — J309 Allergic rhinitis, unspecified: Secondary | ICD-10-CM

## 2017-03-21 NOTE — Telephone Encounter (Signed)
PT CAME IN TO SEE IF WE HAD ANY SAMPLE OF QVAR OR FLOVENT BECAUSE SHE SAID THEY ARE TO HIGH. WANT TO KNOW IF THERE IS SOMETHING  ELSE THAT SHE CAN AFFORD. [facility]. [phone]

## 2017-03-21 NOTE — Telephone Encounter (Signed)
spiriva respimat and flovent are tier 1s.  Please advise

## 2017-03-21 NOTE — Telephone Encounter (Signed)
Please inform patient that Flovent is tier ONE. How much does it cost her? It should be the lowest co-pay possible.

## 2017-03-22 NOTE — Telephone Encounter (Signed)
I left a message for patient to call back with cost information.

## 2017-03-24 NOTE — Telephone Encounter (Signed)
Sent mychart message to patient

## 2017-03-28 ENCOUNTER — Ambulatory Visit (INDEPENDENT_AMBULATORY_CARE_PROVIDER_SITE_OTHER): Payer: Medicare Other | Admitting: *Deleted

## 2017-03-28 DIAGNOSIS — J309 Allergic rhinitis, unspecified: Secondary | ICD-10-CM

## 2017-03-30 ENCOUNTER — Telehealth: Payer: Self-pay

## 2017-03-30 NOTE — Telephone Encounter (Signed)
Please let me know who is seeing this patient. I do not think I have seen her.

## 2017-03-30 NOTE — Telephone Encounter (Signed)
Patient called back and she stated the Flovent is $45.00 a month and it is also Tier 3. She can not afford this due to her have 4 other meds a month for about the same price. Patient is wondering what else to do. Patient email is in epic.  Please Advise

## 2017-03-30 NOTE — Telephone Encounter (Signed)
Flovent 110 mcg 2 inhalations bid. Is there another medication we could switch her to that would cost less?  Thank you.

## 2017-03-30 NOTE — Telephone Encounter (Signed)
She sees Dr. Verlin Fester - routing to him.

## 2017-03-30 NOTE — Telephone Encounter (Signed)
Please check to see if there is a less expensive option then Flovent.  Thanks.

## 2017-03-31 NOTE — Telephone Encounter (Signed)
Pharmacy rep with patient's insurance stated that the Flovent HFA is the lowest cost, but it is tier 3. I have submitted a tier exception in hopes that we can get that co-pay dropped some.

## 2017-03-31 NOTE — Telephone Encounter (Signed)
Will contact insurance and see about a cheaper alternative. If not, I will try a tier exception.

## 2017-04-03 NOTE — Telephone Encounter (Signed)
Insurance denied tier exception. I will submit an appeal as soon as Dr. Verlin Fester signs off.

## 2017-04-04 ENCOUNTER — Ambulatory Visit (INDEPENDENT_AMBULATORY_CARE_PROVIDER_SITE_OTHER): Payer: Medicare Other | Admitting: *Deleted

## 2017-04-04 DIAGNOSIS — J309 Allergic rhinitis, unspecified: Secondary | ICD-10-CM

## 2017-04-11 ENCOUNTER — Other Ambulatory Visit: Payer: Self-pay

## 2017-04-11 MED ORDER — BECLOMETHASONE DIPROP HFA 80 MCG/ACT IN AERB: 2.0000 | INHALATION_SPRAY | Freq: Two times a day (BID) | RESPIRATORY_TRACT | 5 refills | Status: DC

## 2017-04-11 NOTE — Telephone Encounter (Signed)
Patient's insurance denied the appeal for tier exception of the Flovent. Per Dr. Verlin Fester we are going to try the [place] with her insurance. If that does not work then we will try to get patient to purchase every other month and give samples on the alernates if possible.   Prior Auth submitted for Qvar Redinhaler 80 mcg.

## 2017-04-12 ENCOUNTER — Ambulatory Visit (INDEPENDENT_AMBULATORY_CARE_PROVIDER_SITE_OTHER): Payer: Medicare Other | Admitting: *Deleted

## 2017-04-12 DIAGNOSIS — J309 Allergic rhinitis, unspecified: Secondary | ICD-10-CM

## 2017-04-12 LAB — LIPID PANEL
CHOLESTEROL: 186 mg/dL (ref 0–200)
HDL: 60 mg/dL (ref 35–70)
LDL CALC: 107 mg/dL
TRIGLYCERIDES: 99 mg/dL (ref 40–160)

## 2017-04-12 LAB — BASIC METABOLIC PANEL
BUN: 11 mg/dL (ref 4–21)
Creatinine: 0.9 mg/dL (ref 0.5–1.1)
Glucose: 102 mg/dL
Potassium: 4.7 mmol/L (ref 3.4–5.3)
SODIUM: 140 mmol/L (ref 137–147)

## 2017-04-12 LAB — CBC AND DIFFERENTIAL
HEMATOCRIT: 44 % (ref 36–46)
HEMOGLOBIN: 14.1 g/dL (ref 12.0–16.0)
PLATELETS: 218 10*3/uL (ref 150–399)
WBC: 2.8 10^3/mL

## 2017-04-12 LAB — HEMOGLOBIN A1C: Hemoglobin A1C: 6.1

## 2017-04-12 LAB — HEPATIC FUNCTION PANEL
ALK PHOS: 34 U/L (ref 25–125)
ALT: 16 U/L (ref 7–35)
AST: 19 U/L (ref 13–35)
BILIRUBIN, TOTAL: 0.4 mg/dL

## 2017-04-17 ENCOUNTER — Ambulatory Visit: Payer: Medicare Other | Admitting: Allergy and Immunology

## 2017-04-18 ENCOUNTER — Ambulatory Visit (INDEPENDENT_AMBULATORY_CARE_PROVIDER_SITE_OTHER): Payer: Medicare Other | Admitting: *Deleted

## 2017-04-18 DIAGNOSIS — J309 Allergic rhinitis, unspecified: Secondary | ICD-10-CM | POA: Diagnosis not present

## 2017-04-24 ENCOUNTER — Ambulatory Visit (INDEPENDENT_AMBULATORY_CARE_PROVIDER_SITE_OTHER): Payer: Medicare Other | Admitting: Allergy and Immunology

## 2017-04-24 ENCOUNTER — Ambulatory Visit: Payer: Self-pay

## 2017-04-24 ENCOUNTER — Encounter: Payer: Self-pay | Admitting: Allergy and Immunology

## 2017-04-24 VITALS — BP 106/58 | HR 70 | Temp 97.6°F | Resp 14 | Ht 63.5 in | Wt 143.6 lb

## 2017-04-24 DIAGNOSIS — J309 Allergic rhinitis, unspecified: Secondary | ICD-10-CM

## 2017-04-24 DIAGNOSIS — J4540 Moderate persistent asthma, uncomplicated: Secondary | ICD-10-CM | POA: Diagnosis not present

## 2017-04-24 DIAGNOSIS — J3089 Other allergic rhinitis: Secondary | ICD-10-CM

## 2017-04-24 DIAGNOSIS — T7800XD Anaphylactic reaction due to unspecified food, subsequent encounter: Secondary | ICD-10-CM

## 2017-04-24 MED ORDER — ALBUTEROL SULFATE HFA 108 (90 BASE) MCG/ACT IN AERS: 2.0000 | INHALATION_SPRAY | RESPIRATORY_TRACT | 0 refills | Status: DC | PRN

## 2017-04-24 NOTE — Assessment & Plan Note (Signed)
   Continue appropriate allergen avoidance measures, aeroallergen immunotherapy as prescribed and as tolerated, montelukast 10 mg daily, and azelastine nasal spray and/or fluticasone nasal spray as needed.  I have also recommended nasal saline spray (i.e., Simply Saline) or nasal saline lavage (i.e., NeilMed) as needed prior to medicated nasal sprays.  Medications will be decreased or discontinued as symptom relief from immunotherapy becomes evident.

## 2017-04-24 NOTE — Assessment & Plan Note (Signed)
   Start Flovent 110 g HFA, 2 inhalations via spacer device twice a day.  Rinse, gargle, and spit after inhaled corticosteroid use.  Continue montelukast 10 mg daily at bedtime and albuterol HFA, 1-2 inhalations every 4-6 hours as needed.  Subjective and objective measures of pulmonary function will be followed and the treatment plan will be adjusted accordingly.

## 2017-04-24 NOTE — Progress Notes (Signed)
Follow-up Note  RE: Kimberly Lam MRN: [id number] DOB: February 26, 1946 Date of Office Visit: 04/24/2017  Primary care provider: Sandi Mariscal, MD Referring provider: Sandi Mariscal, MD  History of present illness: Kimberly Lam is a 71 y.o. female with persistent asthma, allergic rhinitis, and food allergy presenting today for follow up.  She was last seen in this clinic in 01/16/2017.  She reports that she recently began to experience throat irritation but was uncertain if it was due to viral/bacterial etiology or if he was due to Qvar.  She discontinued Qvar for approximately 1 week before restarting it.  Due to insurance coverage, she has been switched from Qvar to Flovent, however she has not gone in to pick up the Snowflake from the pharmacy yet.  She has been experiencing some nasal congestion recently despite use of fluticasone nasal spray, however she admits that she has not been using nasal saline prior to the fluticasone.   Assessment and plan: Moderate persistent asthma  Start Flovent 110 g HFA, 2 inhalations via spacer device twice a day.  Rinse, gargle, and spit after inhaled corticosteroid use.  Continue montelukast 10 mg daily at bedtime and albuterol HFA, 1-2 inhalations every 4-6 hours as needed.  Subjective and objective measures of pulmonary function will be followed and the treatment plan will be adjusted accordingly.  Perennial and seasonal allergic rhinoconjunctivitis  Continue appropriate allergen avoidance measures, aeroallergen immunotherapy as prescribed and as tolerated, montelukast 10 mg daily, and azelastine nasal spray and/or fluticasone nasal spray as needed.  I have also recommended nasal saline spray (i.e., Simply Saline) or nasal saline lavage (i.e., NeilMed) as needed prior to medicated nasal sprays.  Medications will be decreased or discontinued as symptom relief from immunotherapy becomes evident.  Food allergy  Continue meticulous avoidance of  peanuts, cow's milk, and soybean and have access to epinephrine autoinjector 2 pack in case of accidental ingestion.  Food allergy action plan is in place.   Meds ordered this encounter  Medications  . albuterol (PROAIR HFA) 108 (90 Base) MCG/ACT inhaler    Sig: Inhale 2 puffs into the lungs every 4 (four) hours as needed for wheezing or shortness of breath.    Dispense:  3 Inhaler    Refill:  0    Diagnostics: Spirometry reveals an FVC of 1.95 L (80% predicted) and FEV1 of 1.53 L (80% predicted). This study was performed while the patient was asymptomatic.  Please see scanned spirometry results for details.    Physical examination: Blood pressure (!) 106/58, pulse 70, temperature 97.6 F (36.4 C), temperature source Oral, resp. rate 14, height 5' 3.5" (1.613 m), weight 143 lb 9.6 oz (65.1 kg), SpO2 97 %.  General: Alert, interactive, in no acute distress. HEENT: TMs pearly gray, turbinates mildly edematous with clear discharge, post-pharynx mildly erythematous. Neck: Supple without lymphadenopathy. Lungs: Clear to auscultation without wheezing, rhonchi or rales. CV: Normal S1, S2 without murmurs. Skin: Warm and dry, without lesions or rashes.  The following portions of the patient's history were reviewed and updated as appropriate: allergies, current medications, past family history, past medical history, past social history, past surgical history and problem list.  Allergies as of 04/24/2017      Reactions   Peanut-containing Drug Products Anaphylaxis   Actos [pioglitazone] Nausea Only   Milk-related Compounds Nausea And Vomiting   Milk and cheese   Soy Allergy Nausea Only   Pt states had allergy testing and was told allergic to soy-causes nausea but  no other reactions   Vicodin [hydrocodone-acetaminophen] Nausea Only   nausea      Medication List       Accurate as of 04/24/17 11:45 AM. Always use your most recent med list.          ACCU-CHEK AVIVA PLUS w/Device Kit     ACCU-CHEK FASTCLIX LANCETS Misc   ACCU-CHEK GUIDE test strip Generic drug:  glucose blood   ACIDOPHILUS PROBIOTIC PO Take 1 tablet by mouth daily.   AEROCHAMBER PLUS inhaler Use as instructed with MDI   albuterol 108 (90 Base) MCG/ACT inhaler Commonly known as:  PROAIR HFA Inhale 2 puffs into the lungs every 4 (four) hours as needed for wheezing or shortness of breath.   alendronate 70 MG tablet Commonly known as:  FOSAMAX Take 70 mg by mouth once a week. Take with a full glass of water on an empty stomach.   AMBULATORY NON FORMULARY MEDICATION Insulin Control Formula Take 1 tablet by mouth as needed   Azelastine HCl 0.15 % Soln Place 2 sprays into both nostrils 2 (two) times daily.   baclofen 10 MG tablet Commonly known as:  LIORESAL Take 1 tablet (10 mg total) by mouth 2 (two) times daily.   Beclomethasone Diprop HFA 80 MCG/ACT Aerb Commonly known as:  QVAR REDIHALER Inhale 2 puffs into the lungs 2 (two) times daily.   cholecalciferol 1000 units tablet Commonly known as:  VITAMIN D Take 1,000 Units by mouth daily.   CINNAMON PO Take 1 tablet by mouth as needed.   Cranberry 450 MG Caps Take 1 capsule by mouth as needed.   cycloSPORINE 0.05 % ophthalmic emulsion Commonly known as:  RESTASIS 1 drop 2 (two) times daily.   ECHINACEA COMB/GOLDEN SEAL PO Take 1 tablet by mouth as needed. Reported on 12/16/2015   fexofenadine-pseudoephedrine 180-240 MG 24 hr tablet Commonly known as:  ALLEGRA-D 24 Take 1 tablet by mouth daily. Reported on 12/16/2015   fluticasone 110 MCG/ACT inhaler Commonly known as:  FLOVENT HFA Inhale 2 puffs into the lungs 2 (two) times daily.   fluticasone 50 MCG/ACT nasal spray Commonly known as:  FLONASE Place 1 spray into both nostrils 2 (two) times daily.   gabapentin 300 MG capsule Commonly known as:  NEURONTIN Take 1 capsule (300 mg total) by mouth 4 (four) times daily.   GREEN TEA PO Take by mouth as needed. Reported on  12/16/2015   LANTUS SOLOSTAR 100 UNIT/ML Solostar Pen Generic drug:  Insulin Glargine Inject into the skin. Inject 18-45 units at bedtime.   levocetirizine 5 MG tablet Commonly known as:  XYZAL Take 1 tablet (5 mg total) by mouth every evening.   Melatonin 5 MG Tabs Take 1 tablet by mouth as needed. Reported on 12/16/2015   meloxicam 15 MG tablet Commonly known as:  MOBIC   metFORMIN 500 MG tablet Commonly known as:  GLUCOPHAGE Take by mouth 2 (two) times daily with a meal.   montelukast 10 MG tablet Commonly known as:  SINGULAIR TAKE 1 TABLET BY MOUTH AT  BEDTIME   olopatadine 0.1 % ophthalmic solution Commonly known as:  PATANOL Place 1 drop into both eyes 2 (two) times daily.   OMEGA 3 PO Take 1 tablet by mouth as needed.   omeprazole 40 MG capsule Commonly known as:  PRILOSEC Take 40 mg by mouth daily.   ramipril 2.5 MG capsule Commonly known as:  ALTACE Take 2.5 mg by mouth daily.   simvastatin 20 MG tablet Commonly known  as:  ZOCOR Take 20 mg by mouth daily.   Stinging Nettle 2 % Powd Take 1 tablet by mouth as needed. Reported on 12/16/2015   TURMERIC PO Take 800 mg by mouth as needed. Reported on 12/16/2015   Vitamin B-12 CR 1500 MCG Tbcr Take 1 tablet by mouth as needed.   vitamin C 1000 MG tablet Take 1,000 mg by mouth as needed.       Allergies  Allergen Reactions  . Peanut-Containing Drug Products Anaphylaxis  . Actos [Pioglitazone] Nausea Only  . Milk-Related Compounds Nausea And Vomiting    Milk and cheese  . Soy Allergy Nausea Only    Pt states had allergy testing and was told allergic to soy-causes nausea but no other reactions  . Vicodin [Hydrocodone-Acetaminophen] Nausea Only    nausea   Review of systems: Review of systems negative except as noted in HPI / PMHx or noted below: Constitutional: Negative.  HENT: Negative.   Eyes: Negative.  Respiratory: Negative.   Cardiovascular: Negative.  Gastrointestinal: Negative.    Genitourinary: Negative.  Musculoskeletal: Negative.  Neurological: Negative.  Endo/Heme/Allergies: Negative.  Cutaneous: Negative.  Past Medical History:  Diagnosis Date  . Acid reflux   . Allergic rhinitis   . Anemia   . Asthma   . Bilateral sciatica   . Chronic bilateral low back pain   . Colon polyps   . Diabetes ([place])   . Diverticulosis   . DM type 2 (diabetes mellitus, type 2) ([place])   . Environmental allergies    flowers, pollen, trees  . Hypercholesterolemia   . Hyperlipidemia   . Hypertension   . Low back pain   . Migraine     Family History  Problem Relation Age of Onset  . Asthma Father   . Emphysema Father   . Diabetes Mellitus II Brother     x 2  . Hyperlipidemia Brother   . Hypertension Brother   . Pancreatic cancer Brother   . Heart disease Maternal Grandmother     hardening of the arteries  . Heart murmur Daughter   . Prostate cancer Brother   . Glaucoma Brother     Social History   Social History  . Marital status: Married    Spouse name: N/A  . Number of children: 2  . Years of education: 16   Occupational History  . retired   . Retired    Social History Main Topics  . Smoking status: Never Smoker  . Smokeless tobacco: Never Used  . Alcohol use No  . Drug use: No  . Sexual activity: Not on file   Other Topics Concern  . Not on file   Social History Narrative   ** Merged History Encounter **       ** Data from: 11/23/15 Enc Dept: [facility]   Married 1 son one daughter, relocated to [place] after living in [place] for many years. 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 No caffeine.       ** Data from: 11/25/15 Enc Dept: [facility]   Lives at home with her husband and brother. Right-handed. No caffeine use.    I appreciate the opportunity to take part in Moriya's care. Please do not hesitate to contact me with questions.  Sincerely,   R. Edgar Frisk, MD

## 2017-04-24 NOTE — Patient Instructions (Signed)
Moderate persistent asthma  Start Flovent 110 g HFA, 2 inhalations via spacer device twice a day.  Rinse, gargle, and spit after inhaled corticosteroid use.  Continue montelukast 10 mg daily at bedtime and albuterol HFA, 1-2 inhalations every 4-6 hours as needed.  Subjective and objective measures of pulmonary function will be followed and the treatment plan will be adjusted accordingly.  Perennial and seasonal allergic rhinoconjunctivitis  Continue appropriate allergen avoidance measures, aeroallergen immunotherapy as prescribed and as tolerated, montelukast 10 mg daily, and azelastine nasal spray and/or fluticasone nasal spray as needed.  I have also recommended nasal saline spray (i.e., Simply Saline) or nasal saline lavage (i.e., NeilMed) as needed prior to medicated nasal sprays.  Medications will be decreased or discontinued as symptom relief from immunotherapy becomes evident.  Food allergy  Continue meticulous avoidance of peanuts, cow's milk, and soybean and have access to epinephrine autoinjector 2 pack in case of accidental ingestion.  Food allergy action plan is in place.   Return in about 5 months (around 09/24/2017), or if symptoms worsen or fail to improve.

## 2017-04-24 NOTE — Assessment & Plan Note (Signed)
   Continue meticulous avoidance of peanuts, cow's milk, and soybean and have access to epinephrine autoinjector 2 pack in case of accidental ingestion.  Food allergy action plan is in place.

## 2017-05-04 ENCOUNTER — Telehealth: Payer: Self-pay | Admitting: *Deleted

## 2017-05-04 NOTE — Telephone Encounter (Signed)
Pt has a question regarding her bill from January.Pt states that we billed [employer] and it's supposed to go to [facility]

## 2017-05-04 NOTE — Telephone Encounter (Signed)
Pt thought she paid her copay in January - she will look for receipt - she will also call [facility] Medicare because she doesn't think she should have to pay on breathing test

## 2017-05-09 ENCOUNTER — Ambulatory Visit (INDEPENDENT_AMBULATORY_CARE_PROVIDER_SITE_OTHER): Payer: Medicare Other | Admitting: *Deleted

## 2017-05-09 DIAGNOSIS — J309 Allergic rhinitis, unspecified: Secondary | ICD-10-CM

## 2017-05-17 ENCOUNTER — Encounter: Payer: Self-pay | Admitting: Adult Health

## 2017-05-17 ENCOUNTER — Ambulatory Visit (INDEPENDENT_AMBULATORY_CARE_PROVIDER_SITE_OTHER): Payer: Medicare Other | Admitting: Adult Health

## 2017-05-17 VITALS — BP 132/70 | Temp 98.2°F | Ht 63.5 in | Wt 145.5 lb

## 2017-05-17 DIAGNOSIS — E119 Type 2 diabetes mellitus without complications: Secondary | ICD-10-CM | POA: Diagnosis not present

## 2017-05-17 DIAGNOSIS — Z7689 Persons encountering health services in other specified circumstances: Secondary | ICD-10-CM | POA: Diagnosis not present

## 2017-05-17 MED ORDER — INSULIN GLARGINE 100 UNIT/ML SOLOSTAR PEN: 14.0000 [IU] | PEN_INJECTOR | Freq: Every day | SUBCUTANEOUS | 3 refills | Status: DC

## 2017-05-17 NOTE — Progress Notes (Signed)
Patient presents to clinic today to establish care. She is a pleasant 71 year old female who  has a past medical history of Acid reflux; Allergic rhinitis; Anemia; Asthma; Bilateral sciatica; Chronic bilateral low back pain; Colon polyps; Diverticulosis; DM type 2 (diabetes mellitus, type 2) ([place]); Environmental allergies; Hyperlipidemia; Low back pain; and Migraine.   Is a former patient of Dr. Nancy Fetter .She is due for her physical    Acute Concerns: Establish Care   Chronic Issues: Diabetes - Metformin 500 mg BID, Lantus 18 units ( PRN) - she believes that her last A1c was 6.2. Per her log, most of her blood sugars in between 70-120  Seasonal Allergies - She sees Allergy and Asthma and feels as though this is controlled well.   Hyperlipidemia - Diet controlled.   Health Maintenance: Dental -- Does not do routine care  Vision -- [employer] -- Do for pneumonia vaccination - she refuses today  Colonoscopy -- greater than 10 years - does not want to schedule at this time  Mammogram -- Three years ago - does not want to schedule at this time PAP -- Unknown when last pap was  Bone Density -- Never had Diet: Eats a healthy diet  Exercise: Stays active. No formal exercise   Is followed by   Allergy  ENT Neurology - Dr. Krista [facility]    Past Medical History:  Diagnosis Date  . Acid reflux   . Allergic rhinitis   . Anemia   . Asthma   . Bilateral sciatica   . Chronic bilateral low back pain   . Colon polyps   . Diverticulosis   . DM type 2 (diabetes mellitus, type 2) ([place])   . Environmental allergies    flowers, pollen, trees  . Hyperlipidemia   . Low back pain   . Migraine     Past Surgical History:  Procedure Laterality Date  . CATARACT EXTRACTION, BILATERAL Bilateral 2012  . CESAREAN SECTION     x 2  . DILATION AND CURETTAGE OF UTERUS    . NASAL SINUS SURGERY    . TUBAL LIGATION      Current Outpatient Prescriptions on File  Prior to Visit  Medication Sig Dispense Refill  . ACCU-CHEK FASTCLIX LANCETS MISC     . ACCU-CHEK GUIDE test strip     . albuterol (PROAIR HFA) 108 (90 Base) MCG/ACT inhaler Inhale 2 puffs into the lungs every 4 (four) hours as needed for wheezing or shortness of breath. 3 Inhaler 0  . alendronate (FOSAMAX) 70 MG tablet Take 70 mg by mouth once a week. Take with a full glass of water on an empty stomach.    . AMBULATORY NON FORMULARY MEDICATION Insulin Control Formula Take 1 tablet by mouth as needed    . Ascorbic Acid (VITAMIN C) 1000 MG tablet Take 1,000 mg by mouth as needed.    . Azelastine HCl 0.15 % SOLN Place 2 sprays into both nostrils 2 (two) times daily. 90 mL 0  . baclofen (LIORESAL) 10 MG tablet Take 1 tablet (10 mg total) by mouth 2 (two) times daily. 180 each 3  . Blood Glucose Monitoring Suppl (ACCU-CHEK AVIVA PLUS) w/Device KIT     . cholecalciferol (VITAMIN D) 1000 units tablet Take 1,000 Units by mouth daily.    Marland Kitchen CINNAMON PO Take 1 tablet by mouth as needed.    . Cranberry 450 MG CAPS Take 1 capsule by mouth as needed.    Marland Kitchen  Cyanocobalamin (VITAMIN B-12 CR) 1500 MCG TBCR Take 1 tablet by mouth as needed.    . cycloSPORINE (RESTASIS) 0.05 % ophthalmic emulsion 1 drop 2 (two) times daily.    . Echinacea-Goldenseal (ECHINACEA COMB/GOLDEN SEAL PO) Take 1 tablet by mouth as needed. Reported on 12/16/2015    . fexofenadine-pseudoephedrine (ALLEGRA-D 24) 180-240 MG 24 hr tablet Take 1 tablet by mouth daily. Reported on 12/16/2015    . fluticasone (FLONASE) 50 MCG/ACT nasal spray Place 1 spray into both nostrils 2 (two) times daily. 48 g 0  . fluticasone (FLOVENT HFA) 110 MCG/ACT inhaler Inhale 2 puffs into the lungs 2 (two) times daily. 3 Inhaler 1  . gabapentin (NEURONTIN) 300 MG capsule Take 1 capsule (300 mg total) by mouth 4 (four) times daily. 360 capsule 0  . Green Tea, Camillia sinensis, (GREEN TEA PO) Take by mouth as needed. Reported on 12/16/2015    . Lactobacillus  (ACIDOPHILUS PROBIOTIC PO) Take 1 tablet by mouth daily.    Marland Kitchen levocetirizine (XYZAL) 5 MG tablet Take 1 tablet (5 mg total) by mouth every evening. 90 tablet 0  . Melatonin 5 MG TABS Take 1 tablet by mouth as needed. Reported on 12/16/2015    . meloxicam (MOBIC) 15 MG tablet     . metFORMIN (GLUCOPHAGE) 500 MG tablet Take by mouth 2 (two) times daily with a meal.    . montelukast (SINGULAIR) 10 MG tablet TAKE 1 TABLET BY MOUTH AT  BEDTIME 90 tablet 0  . Omega-3 Fatty Acids (OMEGA 3 PO) Take 1 tablet by mouth as needed.    Marland Kitchen omeprazole (PRILOSEC) 40 MG capsule Take 40 mg by mouth daily.    . ramipril (ALTACE) 2.5 MG capsule Take 2.5 mg by mouth daily.    . simvastatin (ZOCOR) 20 MG tablet Take 20 mg by mouth daily.    Marland Kitchen Spacer/Aero-Holding Chambers (AEROCHAMBER PLUS) inhaler Use as instructed with MDI 1 each 2  . Stinging Nettle 2 % POWD Take 1 tablet by mouth as needed. Reported on 12/16/2015    . TURMERIC PO Take 800 mg by mouth as needed. Reported on 12/16/2015     No current facility-administered medications on file prior to visit.     Allergies  Allergen Reactions  . Peanut-Containing Drug Products Anaphylaxis  . Actos [Pioglitazone] Nausea Only  . Milk-Related Compounds Nausea And Vomiting    Milk and cheese  . Soy Allergy Nausea Only    Pt states had allergy testing and was told allergic to soy-causes nausea but no other reactions  . Vicodin [Hydrocodone-Acetaminophen] Nausea Only    nausea    Family History  Problem Relation Age of Onset  . Asthma Father   . Emphysema Father   . Alcohol abuse Father   . Diabetes Mellitus II Brother        x 2  . Hyperlipidemia Brother   . Hypertension Brother   . Pancreatic cancer Brother   . Heart disease Maternal Grandmother        hardening of the arteries  . Heart murmur Daughter   . Prostate cancer Brother   . Glaucoma Brother     Social History   Social History  . Marital status: Married    Spouse name: N/A  . Number of  children: 2  . Years of education: 16   Occupational History  . Retired    Social History Main Topics  . Smoking status: Never Smoker  . Smokeless tobacco: Never Used  . Alcohol  use No  . Drug use: No  . Sexual activity: Not on file   Other Topics Concern  . Not on file   Social History Narrative   ** Merged History Encounter **       ** Data from: 11/23/15 Enc Dept: [facility]   Married 1 son one daughter, relocated to [place] after living in [place] for many years. 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       ** Data from: 11/25/15 Enc Dept: [facility]   Lives at home with her husband and brother. Right-handed. No caffeine use.    Review of Systems  Constitutional: Negative.   HENT: Negative.   Eyes: Negative.   Respiratory: Negative.   Cardiovascular: Negative.   Gastrointestinal: Negative.   Genitourinary: Negative.   Musculoskeletal: Positive for back pain.  Neurological: Negative.   Psychiatric/Behavioral: Negative.   All other systems reviewed and are negative.   BP 132/70 (BP Location: Left Arm, Patient Position: Sitting, Cuff Size: Normal)   Temp 98.2 F (36.8 C) (Oral)   Ht 5' 3.5" (1.613 m)   Wt 145 lb 8 oz (66 kg)   BMI 25.37 kg/m   Physical Exam  Constitutional: She is oriented to person, place, and time and well-developed, well-nourished, and in no distress. No distress.  Eyes: Conjunctivae and EOM are normal. Pupils are equal, round, and reactive to light. Right eye exhibits no discharge. Left eye exhibits no discharge.  Cardiovascular: Normal rate, regular rhythm, normal heart sounds and intact distal pulses.  Exam reveals no gallop and no friction rub.   No murmur heard. Pulmonary/Chest: Effort normal and breath sounds normal. No respiratory distress. She has no wheezes. She has no rales. She exhibits no tenderness.  Neurological: She is alert and oriented to person, place, and time. Gait normal.  GCS score is 15.  Skin: Skin is warm and dry. No rash noted. She is not diaphoretic. No erythema. No pallor.  Psychiatric: Mood, memory, affect and judgment normal.  Nursing note and vitals reviewed.   Assessment/Plan:  1. Encounter to establish care - Follow up for CPE or sooner if needed  2. Diabetes mellitus without complication (HCC)  - Insulin Glargine (LANTUS SOLOSTAR) 100 UNIT/ML Solostar Pen; Inject 14 Units into the skin daily at 10 pm. Dispense: 15 mL; Refill: 3 - Will check A1c during CPE    [name], NP

## 2017-05-17 NOTE — Patient Instructions (Signed)
It was great meeting you today   Please follow up with me for your physical

## 2017-05-23 ENCOUNTER — Encounter: Payer: Self-pay | Admitting: Family Medicine

## 2017-05-26 ENCOUNTER — Ambulatory Visit (INDEPENDENT_AMBULATORY_CARE_PROVIDER_SITE_OTHER): Payer: Medicare Other | Admitting: *Deleted

## 2017-05-26 DIAGNOSIS — J309 Allergic rhinitis, unspecified: Secondary | ICD-10-CM

## 2017-06-05 ENCOUNTER — Other Ambulatory Visit: Payer: Self-pay

## 2017-06-05 DIAGNOSIS — J4540 Moderate persistent asthma, uncomplicated: Secondary | ICD-10-CM

## 2017-06-05 MED ORDER — LEVOCETIRIZINE DIHYDROCHLORIDE 5 MG PO TABS: 5.0000 mg | ORAL_TABLET | Freq: Every evening | ORAL | 1 refills | Status: DC

## 2017-06-05 MED ORDER — MONTELUKAST SODIUM 10 MG PO TABS: 10.0000 mg | ORAL_TABLET | Freq: Every day | ORAL | 1 refills | Status: DC

## 2017-06-05 MED ORDER — FLUTICASONE PROPIONATE 50 MCG/ACT NA SUSP: 1.0000 | Freq: Two times a day (BID) | NASAL | 1 refills | Status: DC

## 2017-06-05 MED ORDER — ALBUTEROL SULFATE HFA 108 (90 BASE) MCG/ACT IN AERS: 2.0000 | INHALATION_SPRAY | RESPIRATORY_TRACT | 0 refills | Status: DC | PRN

## 2017-06-05 MED ORDER — FLUTICASONE PROPIONATE HFA 110 MCG/ACT IN AERO: 2.0000 | INHALATION_SPRAY | Freq: Two times a day (BID) | RESPIRATORY_TRACT | 1 refills | Status: DC

## 2017-06-05 NOTE — Telephone Encounter (Signed)
Pt sent in a personal fax requesting her levocetirizine to be sent to her [facility]. She also requested all of her medications to go to [facility] as well. 90 day supply of medications sent per pt request.

## 2017-06-06 ENCOUNTER — Ambulatory Visit (INDEPENDENT_AMBULATORY_CARE_PROVIDER_SITE_OTHER): Payer: Medicare Other | Admitting: *Deleted

## 2017-06-06 DIAGNOSIS — J309 Allergic rhinitis, unspecified: Secondary | ICD-10-CM

## 2017-06-13 ENCOUNTER — Encounter: Payer: Self-pay | Admitting: Adult Health

## 2017-06-13 ENCOUNTER — Telehealth: Payer: Self-pay | Admitting: Allergy and Immunology

## 2017-06-13 ENCOUNTER — Telehealth: Payer: Self-pay | Admitting: Allergy

## 2017-06-13 ENCOUNTER — Ambulatory Visit (INDEPENDENT_AMBULATORY_CARE_PROVIDER_SITE_OTHER): Payer: Medicare Other | Admitting: Adult Health

## 2017-06-13 VITALS — BP 126/68 | Temp 98.1°F | Ht 63.5 in | Wt 141.5 lb

## 2017-06-13 DIAGNOSIS — M810 Age-related osteoporosis without current pathological fracture: Secondary | ICD-10-CM | POA: Diagnosis not present

## 2017-06-13 DIAGNOSIS — R1310 Dysphagia, unspecified: Secondary | ICD-10-CM

## 2017-06-13 DIAGNOSIS — Z780 Asymptomatic menopausal state: Secondary | ICD-10-CM | POA: Diagnosis not present

## 2017-06-13 DIAGNOSIS — Z0000 Encounter for general adult medical examination without abnormal findings: Secondary | ICD-10-CM

## 2017-06-13 DIAGNOSIS — E119 Type 2 diabetes mellitus without complications: Secondary | ICD-10-CM | POA: Diagnosis not present

## 2017-06-13 DIAGNOSIS — Z1211 Encounter for screening for malignant neoplasm of colon: Secondary | ICD-10-CM

## 2017-06-13 LAB — LIPID PANEL
CHOL/HDL RATIO: 3
Cholesterol: 168 mg/dL (ref 0–200)
HDL: 63.3 mg/dL (ref >39.00)
LDL Cholesterol: 87 mg/dL (ref 0–99)
NONHDL: 105.17
TRIGLYCERIDES: 89 mg/dL (ref 0.0–149.0)
VLDL: 17.8 mg/dL (ref 0.0–40.0)

## 2017-06-13 LAB — CBC WITH DIFFERENTIAL/PLATELET
BASOS ABS: 0 10*3/uL (ref 0.0–0.1)
Basophils Relative: 1.2 % (ref 0.0–3.0)
EOS ABS: 0.1 10*3/uL (ref 0.0–0.7)
Eosinophils Relative: 3.4 % (ref 0.0–5.0)
HCT: 42.4 % (ref 36.0–46.0)
Hemoglobin: 14.2 g/dL (ref 12.0–15.0)
LYMPHS ABS: 1.1 10*3/uL (ref 0.7–4.0)
Lymphocytes Relative: 34.6 % (ref 12.0–46.0)
MCHC: 33.6 g/dL (ref 30.0–36.0)
MCV: 91 fl (ref 78.0–100.0)
MONO ABS: 0.4 10*3/uL (ref 0.1–1.0)
Monocytes Relative: 12.2 % — ABNORMAL HIGH (ref 3.0–12.0)
NEUTROS ABS: 1.5 10*3/uL (ref 1.4–7.7)
NEUTROS PCT: 48.6 % (ref 43.0–77.0)
PLATELETS: 210 10*3/uL (ref 150.0–400.0)
RBC: 4.66 Mil/uL (ref 3.87–5.11)
RDW: 13.6 % (ref 11.5–15.5)
WBC: 3.1 10*3/uL — ABNORMAL LOW (ref 4.0–10.5)

## 2017-06-13 LAB — HEPATIC FUNCTION PANEL
ALK PHOS: 37 U/L — AB (ref 39–117)
ALT: 17 U/L (ref 0–35)
AST: 17 U/L (ref 0–37)
Albumin: 4.8 g/dL (ref 3.5–5.2)
BILIRUBIN DIRECT: 0.1 mg/dL (ref 0.0–0.3)
Total Bilirubin: 0.5 mg/dL (ref 0.2–1.2)
Total Protein: 7.3 g/dL (ref 6.0–8.3)

## 2017-06-13 LAB — BASIC METABOLIC PANEL
BUN: 10 mg/dL (ref 6–23)
CALCIUM: 10.7 mg/dL — AB (ref 8.4–10.5)
CO2: 31 mEq/L (ref 19–32)
CREATININE: 0.8 mg/dL (ref 0.40–1.20)
Chloride: 104 mEq/L (ref 96–112)
GFR: 91.26 mL/min (ref >60.00)
GLUCOSE: 113 mg/dL — AB (ref 70–99)
Potassium: 5.1 mEq/L (ref 3.5–5.1)
Sodium: 144 mEq/L (ref 135–145)

## 2017-06-13 LAB — TSH: TSH: 1.14 u[IU]/mL (ref 0.35–4.50)

## 2017-06-13 LAB — HEMOGLOBIN A1C: Hgb A1c MFr Bld: 7.1 % — ABNORMAL HIGH (ref 4.6–6.5)

## 2017-06-13 LAB — VITAMIN D 25 HYDROXY (VIT D DEFICIENCY, FRACTURES): VITD: 67.02 ng/mL (ref 30.00–100.00)

## 2017-06-13 NOTE — Patient Instructions (Signed)
It was great seeing you today   I will follow up with you regarding your labs   Roselyn Reef will call you in 5-7 days about Prolia   Someone from GI will call yo to schedule an appointment   Please call [facility] to schedule mammogram and bone density   Address: [address], [place], [place] [address] Phone: [phone]

## 2017-06-13 NOTE — Telephone Encounter (Signed)
Left message that it is ok to make monthly pmts and it will not get turned over - kt

## 2017-06-13 NOTE — Telephone Encounter (Signed)
Left message that it is ok to make monthly pmts & it will not get turned over - kt

## 2017-06-13 NOTE — Progress Notes (Signed)
Subjective:    Patient ID: [name], female    DOB: 02/13/1946, 71 y.o.   MRN: [id number]  HPI  Patient presents for yearly preventative medicine examination. She is a pleasant 71 year old female who  has a past medical history of Acid reflux; Allergic rhinitis; Anemia; Asthma; Bilateral sciatica; Chronic bilateral low back pain; Colon polyps; Diverticulosis; DM type 2 (diabetes mellitus, type 2) ([place]); Environmental allergies; Hyperlipidemia; Low back pain; and Migraine.  All immunizations and health maintenance protocols were reviewed with the patient and needed orders were placed.  She refuses vaccinations.   Appropriate screening laboratory values were ordered for the patient including screening of hyperlipidemia, renal function and hepatic function.   Medication reconciliation,  past medical history, social history, problem list and allergies were reviewed in detail with the patient  Goals were established with regard to weight loss, exercise, and  diet in compliance with medications. She follows a diabetic diet and is very active.   Diabetes - Metformin 500 mg BID, Lantus 18 units ( PRN) - Her last A1c on 04/12/2017 was 6.1. She takes Ramipril 2.5 mg for kidney protection. She denies any constant numbness or tingling in her lower extremities.   For hyperlipidemia she takes  Zocor 20 mg.  She is taking Fosamax for bone health due to Osteoporosis. She would like to discuss Prolia Injections    She is due for colonoscopy, mammogram and bone density. She does routine eye exams at [facility]  She does complain of dysphagia and has had to have an endoscopy in the past. She is due for a colonoscopy and would like to see GI first to see if she needs to have an endoscopy.   Review of Systems  Constitutional: Negative.   HENT: Negative.   Eyes: Negative.   Respiratory: Negative.   Cardiovascular: Negative.   Gastrointestinal: Negative.   Endocrine: Negative.     Genitourinary: Negative.   Musculoskeletal: Negative.   Skin: Negative.   Allergic/Immunologic: Negative.   Neurological: Negative.   Hematological: Negative.   All other systems reviewed and are negative.  Past Medical History:  Diagnosis Date  . Acid reflux   . Allergic rhinitis   . Anemia   . Asthma   . Bilateral sciatica   . Chronic bilateral low back pain   . Colon polyps   . Diverticulosis   . DM type 2 (diabetes mellitus, type 2) ([place])   . Environmental allergies    flowers, pollen, trees  . Hyperlipidemia   . Low back pain   . Migraine     Social History   Social History  . Marital status: Married    Spouse name: N/A  . Number of children: 2  . Years of education: 16   Occupational History  . Retired    Social History Main Topics  . Smoking status: Never Smoker  . Smokeless tobacco: Never Used  . Alcohol use No  . Drug use: No  . Sexual activity: Not on file   Other Topics Concern  . Not on file   Social History Narrative   ** Merged History Encounter **       ** Data from: 11/23/15 Enc Dept: [facility]   Married 1 son one daughter, relocated to [place] after living in [place] for many years. She is a Writer of [employer]. No caffeine.       ** Data from: 11/25/15 Enc Dept: [facility]  Lives at home with her husband and brother. Right-handed. No caffeine use.    Past Surgical History:  Procedure Laterality Date  . CATARACT EXTRACTION, BILATERAL Bilateral 2012  . CESAREAN SECTION     x 2  . DILATION AND CURETTAGE OF UTERUS    . NASAL SINUS SURGERY    . TUBAL LIGATION      Family History  Problem Relation Age of Onset  . Asthma Father   . Emphysema Father   . Alcohol abuse Father   . Diabetes Mellitus II Brother        x 2  . Hyperlipidemia Brother   . Hypertension Brother   . Pancreatic cancer Brother   . Heart disease Maternal Grandmother        hardening of the arteries  . Heart  murmur Daughter   . Prostate cancer Brother   . Glaucoma Brother     Allergies  Allergen Reactions  . Peanut-Containing Drug Products Anaphylaxis  . Actos [Pioglitazone] Nausea Only  . Milk-Related Compounds Nausea And Vomiting    Milk and cheese  . Soy Allergy Nausea Only    Pt states had allergy testing and was told allergic to soy-causes nausea but no other reactions  . Vicodin [Hydrocodone-Acetaminophen] Nausea Only    nausea    Current Outpatient Prescriptions on File Prior to Visit  Medication Sig Dispense Refill  . ACCU-CHEK FASTCLIX LANCETS MISC     . ACCU-CHEK GUIDE test strip     . albuterol (PROAIR HFA) 108 (90 Base) MCG/ACT inhaler Inhale 2 puffs into the lungs every 4 (four) hours as needed for wheezing or shortness of breath. 3 Inhaler 0  . alendronate (FOSAMAX) 70 MG tablet Take 70 mg by mouth once a week. Take with a full glass of water on an empty stomach.    . AMBULATORY NON FORMULARY MEDICATION Insulin Control Formula Take 1 tablet by mouth as needed    . Ascorbic Acid (VITAMIN C) 1000 MG tablet Take 1,000 mg by mouth as needed.    . Azelastine HCl 0.15 % SOLN Place 2 sprays into both nostrils 2 (two) times daily. 90 mL 0  . baclofen (LIORESAL) 10 MG tablet Take 1 tablet (10 mg total) by mouth 2 (two) times daily. 180 each 3  . Blood Glucose Monitoring Suppl (ACCU-CHEK AVIVA PLUS) w/Device KIT     . cholecalciferol (VITAMIN D) 1000 units tablet Take 1,000 Units by mouth daily.    Marland Kitchen CINNAMON PO Take 1 tablet by mouth as needed.    . Cranberry 450 MG CAPS Take 1 capsule by mouth as needed.    . Cyanocobalamin (VITAMIN B-12 CR) 1500 MCG TBCR Take 1 tablet by mouth as needed.    . cycloSPORINE (RESTASIS) 0.05 % ophthalmic emulsion 1 drop 2 (two) times daily.    . Echinacea-Goldenseal (ECHINACEA COMB/GOLDEN SEAL PO) Take 1 tablet by mouth as needed. Reported on 12/16/2015    . fexofenadine-pseudoephedrine (ALLEGRA-D 24) 180-240 MG 24 hr tablet Take 1 tablet by mouth  daily. Reported on 12/16/2015    . fluticasone (FLONASE) 50 MCG/ACT nasal spray Place 1 spray into both nostrils 2 (two) times daily. 48 g 1  . gabapentin (NEURONTIN) 300 MG capsule Take 1 capsule (300 mg total) by mouth 4 (four) times daily. 360 capsule 0  . Green Tea, Camillia sinensis, (GREEN TEA PO) Take by mouth as needed. Reported on 12/16/2015    . Insulin Glargine (LANTUS SOLOSTAR) 100 UNIT/ML Solostar Pen Inject 14 Units  into the skin daily at 10 pm. Inject 18-45 units at bedtime. 15 mL 3  . Lactobacillus (ACIDOPHILUS PROBIOTIC PO) Take 1 tablet by mouth daily.    Marland Kitchen levocetirizine (XYZAL) 5 MG tablet Take 1 tablet (5 mg total) by mouth every evening. 90 tablet 1  . Melatonin 5 MG TABS Take 1 tablet by mouth as needed. Reported on 12/16/2015    . meloxicam (MOBIC) 15 MG tablet     . metFORMIN (GLUCOPHAGE) 500 MG tablet Take by mouth 2 (two) times daily with a meal.    . montelukast (SINGULAIR) 10 MG tablet Take 1 tablet (10 mg total) by mouth at bedtime. 90 tablet 1  . Omega-3 Fatty Acids (OMEGA 3 PO) Take 1 tablet by mouth as needed.    Marland Kitchen omeprazole (PRILOSEC) 40 MG capsule Take 40 mg by mouth daily.    . ramipril (ALTACE) 2.5 MG capsule Take 2.5 mg by mouth daily.    . simvastatin (ZOCOR) 20 MG tablet Take 20 mg by mouth daily.    Marland Kitchen Spacer/Aero-Holding Chambers (AEROCHAMBER PLUS) inhaler Use as instructed with MDI 1 each 2  . Stinging Nettle 2 % POWD Take 1 tablet by mouth as needed. Reported on 12/16/2015    . TURMERIC PO Take 800 mg by mouth as needed. Reported on 12/16/2015     No current facility-administered medications on file prior to visit.     BP 126/68 (BP Location: Left Arm, Patient Position: Sitting, Cuff Size: Normal)   Temp 98.1 F (36.7 C) (Oral)   Ht 5' 3.5" (1.613 m)   Wt 141 lb 8 oz (64.2 kg)   BMI 24.67 kg/m       Objective:   Physical Exam  Constitutional: She is oriented to person, place, and time. She appears well-developed and well-nourished. No  distress.  HENT:  Head: Normocephalic and atraumatic.  Right Ear: External ear normal.  Left Ear: External ear normal.  Nose: Nose normal.  Mouth/Throat: Oropharynx is clear and moist. No oropharyngeal exudate.  Eyes: Conjunctivae and EOM are normal. Pupils are equal, round, and reactive to light. Right eye exhibits no discharge. Left eye exhibits no discharge. No scleral icterus.  Neck: Normal range of motion. Neck supple. No JVD present. Carotid bruit is not present. No tracheal deviation present. No thyromegaly present.  Cardiovascular: Normal rate, regular rhythm, normal heart sounds and intact distal pulses.  Exam reveals no gallop and no friction rub.   No murmur heard. Pulmonary/Chest: Effort normal and breath sounds normal. No stridor. No respiratory distress. She has no wheezes. She has no rales. She exhibits no tenderness.  Abdominal: Soft. Bowel sounds are normal. She exhibits no distension and no mass. There is no tenderness. There is no rebound and no guarding.  Genitourinary:  Genitourinary Comments: Breast Exam: No masses, lumps, dimpling, or discharge   Musculoskeletal: Normal range of motion. She exhibits no edema, tenderness or deformity.  Lymphadenopathy:    She has no cervical adenopathy.  Neurological: She is alert and oriented to person, place, and time. She has normal reflexes. She displays normal reflexes. No cranial nerve deficit. She exhibits normal muscle tone. Coordination normal.  Skin: Skin is warm and dry. No rash noted. She is not diaphoretic. No erythema. No pallor.  Psychiatric: She has a normal mood and affect. Her behavior is normal. Judgment and thought content normal.  Nursing note and vitals reviewed.     Assessment & Plan:  1. Routine general medical examination at a health care  facility -  - Basic metabolic panel - CBC with Differential/Platelet - Hepatic function panel - Lipid panel - TSH - Hemoglobin A1c - Vitamin D, 25-hydroxy  2.  Diabetes mellitus without complication (HCC) - Well controlled in the past.  - Basic metabolic panel - CBC with Differential/Platelet - Hepatic function panel - Lipid panel - TSH - Hemoglobin A1c - Vitamin D, 25-hydroxy - Consider change in metformin  3. Osteoporosis without current pathological fracture, unspecified osteoporosis type - Will check on cost for Prolia.  - Vitamin D, 25-hydroxy - DG Bone Density; Future  4. Post-menopausal - Vitamin D, 25-hydroxy - DG Bone Density; Future - MM DIGITAL SCREENING BILATERAL; Future  5. Colon cancer screening  - Ambulatory referral to Gastroenterology  6. Dysphagia, unspecified type - Ambulatory referral to Gastroenterology

## 2017-06-13 NOTE — Telephone Encounter (Signed)
Kimberly Lam returned your call no answer. Must have been on another call.She just asked me to let you know that she called.

## 2017-06-13 NOTE — Telephone Encounter (Signed)
Pt received letter regarding acct going to collections. She stated she will be making payments each billing cycle and wants to confirm that this will prevent her from going to collections.

## 2017-06-14 ENCOUNTER — Other Ambulatory Visit: Payer: Self-pay | Admitting: Adult Health

## 2017-06-14 DIAGNOSIS — Z1231 Encounter for screening mammogram for malignant neoplasm of breast: Secondary | ICD-10-CM

## 2017-06-14 DIAGNOSIS — E2839 Other primary ovarian failure: Secondary | ICD-10-CM

## 2017-06-23 ENCOUNTER — Other Ambulatory Visit: Payer: Self-pay

## 2017-06-23 ENCOUNTER — Ambulatory Visit (INDEPENDENT_AMBULATORY_CARE_PROVIDER_SITE_OTHER): Payer: Medicare Other

## 2017-06-23 DIAGNOSIS — J309 Allergic rhinitis, unspecified: Secondary | ICD-10-CM

## 2017-06-23 MED ORDER — FLUTICASONE PROPIONATE HFA 110 MCG/ACT IN AERO: 2.0000 | INHALATION_SPRAY | Freq: Two times a day (BID) | RESPIRATORY_TRACT | 5 refills | Status: DC

## 2017-06-23 NOTE — Telephone Encounter (Signed)
Pt came in stating that she is having issues with sores in her mouth due to her albuterol.  When discussing this, pt stated that she never got her Flovent and that she is only using her Proair.   Flovent 110 mcg sent to pharmacy. Advised pt to use spacer and to not use her Proair unless she experiences wheezing, cough, or shortness of breath.

## 2017-07-05 ENCOUNTER — Ambulatory Visit (INDEPENDENT_AMBULATORY_CARE_PROVIDER_SITE_OTHER): Payer: Medicare Other

## 2017-07-05 DIAGNOSIS — J309 Allergic rhinitis, unspecified: Secondary | ICD-10-CM | POA: Diagnosis not present

## 2017-07-12 DIAGNOSIS — J3089 Other allergic rhinitis: Secondary | ICD-10-CM | POA: Diagnosis not present

## 2017-07-13 DIAGNOSIS — J301 Allergic rhinitis due to pollen: Secondary | ICD-10-CM | POA: Diagnosis not present

## 2017-07-14 DIAGNOSIS — J3081 Allergic rhinitis due to animal (cat) (dog) hair and dander: Secondary | ICD-10-CM | POA: Diagnosis not present

## 2017-07-18 ENCOUNTER — Ambulatory Visit (INDEPENDENT_AMBULATORY_CARE_PROVIDER_SITE_OTHER): Payer: Medicare Other | Admitting: *Deleted

## 2017-07-18 DIAGNOSIS — J309 Allergic rhinitis, unspecified: Secondary | ICD-10-CM

## 2017-08-01 ENCOUNTER — Ambulatory Visit (INDEPENDENT_AMBULATORY_CARE_PROVIDER_SITE_OTHER): Payer: Medicare Other | Admitting: *Deleted

## 2017-08-01 ENCOUNTER — Telehealth: Payer: Self-pay | Admitting: *Deleted

## 2017-08-01 DIAGNOSIS — J309 Allergic rhinitis, unspecified: Secondary | ICD-10-CM | POA: Diagnosis not present

## 2017-08-01 NOTE — Telephone Encounter (Signed)
Discontinue Flovent. Switch to Spiriva respimat, 1.25 mcg, 2 inhalations daily.

## 2017-08-01 NOTE — Telephone Encounter (Signed)
Lm for pt to call us back  

## 2017-08-01 NOTE — Telephone Encounter (Signed)
Patient came in today to get allergy injection states she cannot use Flovent due to causing sores in mouth.states this is the 3rd inhaler she has not been able to tolerate. Dr Verlin Fester please advise.

## 2017-08-02 ENCOUNTER — Telehealth: Payer: Self-pay

## 2017-08-02 NOTE — Telephone Encounter (Signed)
Called patient to try to get her scheduled for her Prolia injection. She didn't know what I was referring to. Per [name], NP OV note dated 06/13/2017:  She is taking Fosamax for bone health due to Osteoporosis. She would like to discuss Prolia Injections    She was in a meeting and stated she would call back to discuss tomorrow.

## 2017-08-02 NOTE — Telephone Encounter (Signed)
Spoke to patient and she stated that she was going to see if her insurance covered the medication and she would call us back to let us know to send in the medication.

## 2017-08-07 ENCOUNTER — Telehealth: Payer: Self-pay | Admitting: Allergy and Immunology

## 2017-08-07 NOTE — Telephone Encounter (Signed)
Pt was returning a nurse call about the medication  [facility] [address]  [phone]

## 2017-08-07 NOTE — Telephone Encounter (Signed)
Left message to return call 

## 2017-08-07 NOTE — Telephone Encounter (Signed)
Patient came in to pick up sample teaching done on Spiriva

## 2017-08-07 NOTE — Telephone Encounter (Addendum)
Spoke to patient will provide sample for Spiriva 1.25mg . Advised patient to pick up and get a nurse when here to show how to use device. Patient verbalized understanding.

## 2017-08-14 ENCOUNTER — Encounter: Payer: Self-pay | Admitting: Adult Health

## 2017-08-15 ENCOUNTER — Ambulatory Visit (INDEPENDENT_AMBULATORY_CARE_PROVIDER_SITE_OTHER): Payer: Medicare Other | Admitting: *Deleted

## 2017-08-15 DIAGNOSIS — J309 Allergic rhinitis, unspecified: Secondary | ICD-10-CM

## 2017-08-18 ENCOUNTER — Ambulatory Visit
Admission: RE | Admit: 2017-08-18 | Discharge: 2017-08-18 | Disposition: A | Discharge status: Home / Self Care | Payer: Medicare Other | Source: Ambulatory Visit | Attending: Adult Health | Admitting: Adult Health

## 2017-08-18 DIAGNOSIS — Z1231 Encounter for screening mammogram for malignant neoplasm of breast: Secondary | ICD-10-CM

## 2017-08-18 DIAGNOSIS — E2839 Other primary ovarian failure: Secondary | ICD-10-CM

## 2017-08-22 ENCOUNTER — Telehealth: Payer: Self-pay | Admitting: *Deleted

## 2017-08-22 ENCOUNTER — Ambulatory Visit (INDEPENDENT_AMBULATORY_CARE_PROVIDER_SITE_OTHER): Payer: Medicare Other

## 2017-08-22 DIAGNOSIS — J309 Allergic rhinitis, unspecified: Secondary | ICD-10-CM | POA: Diagnosis not present

## 2017-08-22 MED ORDER — TIOTROPIUM BROMIDE MONOHYDRATE 1.25 MCG/ACT IN AERS: 2.0000 | INHALATION_SPRAY | Freq: Every day | RESPIRATORY_TRACT | 3 refills | Status: DC

## 2017-08-22 NOTE — Telephone Encounter (Signed)
Patient walked in would like a sample of spiriva 1.25mg  also reviewed how to use is. Script sent in. Patient states she is still having sores in mouth and causing her to have issues with her gums but is using it as needed. Also states she gives it a few days in between then reuses inhaler. FYI

## 2017-08-22 NOTE — Telephone Encounter (Signed)
Noted  

## 2017-08-29 ENCOUNTER — Ambulatory Visit (INDEPENDENT_AMBULATORY_CARE_PROVIDER_SITE_OTHER): Payer: Medicare Other | Admitting: *Deleted

## 2017-08-29 DIAGNOSIS — J309 Allergic rhinitis, unspecified: Secondary | ICD-10-CM

## 2017-09-03 ENCOUNTER — Encounter: Payer: Self-pay | Admitting: Adult Health

## 2017-09-04 MED ORDER — ACCU-CHEK FASTCLIX LANCETS MISC: 5 refills | Status: DC

## 2017-09-05 ENCOUNTER — Ambulatory Visit (INDEPENDENT_AMBULATORY_CARE_PROVIDER_SITE_OTHER): Payer: Medicare Other

## 2017-09-05 DIAGNOSIS — J309 Allergic rhinitis, unspecified: Secondary | ICD-10-CM

## 2017-09-08 ENCOUNTER — Encounter: Payer: Self-pay | Admitting: Adult Health

## 2017-09-12 ENCOUNTER — Ambulatory Visit (INDEPENDENT_AMBULATORY_CARE_PROVIDER_SITE_OTHER): Payer: Medicare Other | Admitting: *Deleted

## 2017-09-12 DIAGNOSIS — J309 Allergic rhinitis, unspecified: Secondary | ICD-10-CM | POA: Diagnosis not present

## 2017-09-14 ENCOUNTER — Encounter: Payer: Self-pay | Admitting: Adult Health

## 2017-09-14 ENCOUNTER — Ambulatory Visit (INDEPENDENT_AMBULATORY_CARE_PROVIDER_SITE_OTHER): Payer: Medicare Other | Admitting: Adult Health

## 2017-09-14 VITALS — BP 134/60 | Temp 98.0°F | Ht 63.5 in | Wt 139.0 lb

## 2017-09-14 DIAGNOSIS — E119 Type 2 diabetes mellitus without complications: Secondary | ICD-10-CM

## 2017-09-14 DIAGNOSIS — K219 Gastro-esophageal reflux disease without esophagitis: Secondary | ICD-10-CM | POA: Diagnosis not present

## 2017-09-14 LAB — POCT GLYCOSYLATED HEMOGLOBIN (HGB A1C): Hemoglobin A1C: 6.1

## 2017-09-14 NOTE — Progress Notes (Signed)
Subjective:    Patient ID: Kimberly Lam, female    DOB: [birth year], 71 y.o.   MRN: [id number]  HPI  71 year old female who  has a past medical history of Acid reflux; Allergic rhinitis; Anemia; Asthma; Bilateral sciatica; Chronic bilateral low back pain; Colon polyps; Diverticulosis; DM type 2 (diabetes mellitus, type 2) ([place]); Environmental allergies; Hyperlipidemia; Low back pain; and Migraine.  She presents to the office today for three month follow up regarding diabetes. Her current regimen includes that of Metformin 500 mg BID and Lantus 18 units ( PRN). Her previous A1c in June 2018 was 7.1.   Today in the office she reports that her blood sugars are well controlled at home. She is having to take Lantus every 2-3 days ,and she titrates at home. She has not had to take more than 14 units at a time.   Overall, she has no complaints. She states " I am feeling pretty well"   Review of Systems See HPI   Past Medical History:  Diagnosis Date  . Acid reflux   . Allergic rhinitis   . Anemia   . Asthma   . Bilateral sciatica   . Chronic bilateral low back pain   . Colon polyps   . Diverticulosis   . DM type 2 (diabetes mellitus, type 2) ([place])   . Environmental allergies    flowers, pollen, trees  . Hyperlipidemia   . Low back pain   . Migraine     Social History   Social History  . Marital status: Married    Spouse name: N/A  . Number of children: 2  . Years of education: 16   Occupational History  . Retired    Social History Main Topics  . Smoking status: Never Smoker  . Smokeless tobacco: Never Used  . Alcohol use No  . Drug use: No  . Sexual activity: Not on file   Other Topics Concern  . Not on file   Social History Narrative   ** Merged History Encounter **       ** Data from: 11/23/15 Enc Dept: [facility]   Married 1 son one daughter, relocated to [place] after living in [place] for many years. 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       ** Data from: 11/25/15 Enc Dept: [facility]   Lives at home with her husband and brother. Right-handed. No caffeine use.    Past Surgical History:  Procedure Laterality Date  . CATARACT EXTRACTION, BILATERAL Bilateral 2012  . CESAREAN SECTION     x 2  . DILATION AND CURETTAGE OF UTERUS    . NASAL SINUS SURGERY    . TUBAL LIGATION      Family History  Problem Relation Age of Onset  . Asthma Father   . Emphysema Father   . Alcohol abuse Father   . Diabetes Mellitus II Brother        x 2  . Hyperlipidemia Brother   . Hypertension Brother   . Pancreatic cancer Brother   . Heart disease Maternal Grandmother        hardening of the arteries  . Heart murmur Daughter   . Prostate cancer Brother   . Glaucoma Brother   . Breast cancer Neg Hx     Allergies  Allergen Reactions  . Peanut-Containing Drug Products Anaphylaxis  . Actos [Pioglitazone] Nausea Only  . Milk-Related Compounds Nausea And  Vomiting    Milk and cheese  . Soy Allergy Nausea Only    Pt states had allergy testing and was told allergic to soy-causes nausea but no other reactions  . Vicodin [Hydrocodone-Acetaminophen] Nausea Only    nausea    Current Outpatient Prescriptions on File Prior to Visit  Medication Sig Dispense Refill  . ACCU-CHEK FASTCLIX LANCETS MISC USE TO TEST BLOOD GLUCOSE 3-4 TIMES DAILY 120 each 5  . ACCU-CHEK GUIDE test strip     . albuterol (PROAIR HFA) 108 (90 Base) MCG/ACT inhaler Inhale 2 puffs into the lungs every 4 (four) hours as needed for wheezing or shortness of breath. 3 Inhaler 0  . alendronate (FOSAMAX) 70 MG tablet Take 70 mg by mouth once a week. Take with a full glass of water on an empty stomach.    . AMBULATORY NON FORMULARY MEDICATION Insulin Control Formula Take 1 tablet by mouth as needed    . Ascorbic Acid (VITAMIN C) 1000 MG tablet Take 1,000 mg by mouth as needed.    . Azelastine HCl 0.15 % SOLN Place 2 sprays into both  nostrils 2 (two) times daily. 90 mL 0  . baclofen (LIORESAL) 10 MG tablet Take 1 tablet (10 mg total) by mouth 2 (two) times daily. 180 each 3  . Blood Glucose Monitoring Suppl (ACCU-CHEK AVIVA PLUS) w/Device KIT     . cholecalciferol (VITAMIN D) 1000 units tablet Take 1,000 Units by mouth daily.    . Cranberry 450 MG CAPS Take 1 capsule by mouth as needed.    . Cyanocobalamin (VITAMIN B-12 CR) 1500 MCG TBCR Take 1 tablet by mouth as needed.    . cycloSPORINE (RESTASIS) 0.05 % ophthalmic emulsion 1 drop 2 (two) times daily.    . Echinacea-Goldenseal (ECHINACEA COMB/GOLDEN SEAL PO) Take 1 tablet by mouth as needed. Reported on 12/16/2015    . fluticasone (FLONASE) 50 MCG/ACT nasal spray Place 1 spray into both nostrils 2 (two) times daily. 48 g 1  . gabapentin (NEURONTIN) 300 MG capsule Take 1 capsule (300 mg total) by mouth 4 (four) times daily. 360 capsule 0  . Green Tea, Camillia sinensis, (GREEN TEA PO) Take by mouth as needed. Reported on 12/16/2015    . Insulin Glargine (LANTUS SOLOSTAR) 100 UNIT/ML Solostar Pen Inject 14 Units into the skin daily at 10 pm. Inject 18-45 units at bedtime. 15 mL 3  . Lactobacillus (ACIDOPHILUS PROBIOTIC PO) Take 1 tablet by mouth daily.    Marland Kitchen levocetirizine (XYZAL) 5 MG tablet Take 1 tablet (5 mg total) by mouth every evening. 90 tablet 1  . Melatonin 5 MG TABS Take 1 tablet by mouth as needed. Reported on 12/16/2015    . meloxicam (MOBIC) 15 MG tablet     . metFORMIN (GLUCOPHAGE) 500 MG tablet Take by mouth 2 (two) times daily with a meal.    . montelukast (SINGULAIR) 10 MG tablet Take 1 tablet (10 mg total) by mouth at bedtime. 90 tablet 1  . Omega-3 Fatty Acids (OMEGA 3 PO) Take 1 tablet by mouth as needed.    Marland Kitchen omeprazole (PRILOSEC) 40 MG capsule Take 40 mg by mouth daily.    . ramipril (ALTACE) 2.5 MG capsule Take 2.5 mg by mouth daily.    . simvastatin (ZOCOR) 20 MG tablet Take 20 mg by mouth daily.    Marland Kitchen Spacer/Aero-Holding Chambers (AEROCHAMBER PLUS)  inhaler Use as instructed with MDI 1 each 2  . Stinging Nettle 2 % POWD Take 1 tablet by  mouth as needed. Reported on 12/16/2015    . Tiotropium Bromide Monohydrate (SPIRIVA RESPIMAT) 1.25 MCG/ACT AERS Inhale 2 puffs into the lungs daily. 4 g 3  . TURMERIC PO Take 800 mg by mouth as needed. Reported on 12/16/2015    . CINNAMON PO Take 1 tablet by mouth as needed.     No current facility-administered medications on file prior to visit.     BP 134/60 (BP Location: Left Arm)   Temp 98 F (36.7 C) (Oral)   Ht 5' 3.5" (1.613 m)   Wt 139 lb (63 kg)   BMI 24.24 kg/m       Objective:   Physical Exam  Constitutional: She is oriented to person, place, and time. She appears well-developed and well-nourished. No distress.  Cardiovascular: Normal rate, regular rhythm, normal heart sounds and intact distal pulses.  Exam reveals no gallop and no friction rub.   No murmur heard. Pulmonary/Chest: Effort normal and breath sounds normal. No respiratory distress. She has no wheezes. She has no rales. She exhibits no tenderness.  Neurological: She is alert and oriented to person, place, and time.  Skin: Skin is warm and dry. No rash noted. She is not diaphoretic. No erythema.  Psychiatric: She has a normal mood and affect. Her behavior is normal. Judgment and thought content normal.  Nursing note and vitals reviewed.     Assessment & Plan:  1. Diabetes mellitus without complication (HCC) - POCT A1C - 6.1. Has improved form 7.1  - Continue with current therapy  - Follow up in 6 months   [name], NP

## 2017-09-26 ENCOUNTER — Ambulatory Visit (INDEPENDENT_AMBULATORY_CARE_PROVIDER_SITE_OTHER): Payer: Medicare Other

## 2017-09-26 DIAGNOSIS — J309 Allergic rhinitis, unspecified: Secondary | ICD-10-CM

## 2017-09-27 ENCOUNTER — Telehealth: Payer: Self-pay | Admitting: Adult Health

## 2017-09-27 NOTE — Telephone Encounter (Signed)
Spoke with pt this afternoon and informed her that the Prolia benefits verfication form showed she ould be responsible for 20% of the cost, about $230  Pt was provided with the [employer] number to appply for a grant. ([phone]   Please note.

## 2017-10-10 ENCOUNTER — Ambulatory Visit (INDEPENDENT_AMBULATORY_CARE_PROVIDER_SITE_OTHER): Payer: Medicare Other | Admitting: *Deleted

## 2017-10-10 ENCOUNTER — Telehealth: Payer: Self-pay | Admitting: Allergy and Immunology

## 2017-10-10 DIAGNOSIS — J309 Allergic rhinitis, unspecified: Secondary | ICD-10-CM | POA: Diagnosis not present

## 2017-10-10 NOTE — Telephone Encounter (Signed)
Samples ready for pick up

## 2017-10-10 NOTE — Telephone Encounter (Signed)
Patient would like a sample of Spiriva Respimat.

## 2017-10-11 NOTE — Telephone Encounter (Signed)
Left message advising patient that samples are up front.

## 2017-10-18 ENCOUNTER — Telehealth: Payer: Self-pay | Admitting: Adult Health

## 2017-10-18 NOTE — Telephone Encounter (Signed)
Pt states she is returning your call about prolia inj. She gave the people the information, but they need more than just post menopausal.  Something about a dx code. Pt would like a call back

## 2017-10-19 NOTE — Telephone Encounter (Signed)
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 I informed pt numerous times of her dx.  Please give pt a call back.

## 2017-10-24 NOTE — Telephone Encounter (Signed)
No answer on phone.  Sent pt a my chart message with this information.

## 2017-10-25 ENCOUNTER — Other Ambulatory Visit: Payer: Self-pay | Admitting: Adult Health

## 2017-10-25 ENCOUNTER — Telehealth: Payer: Self-pay | Admitting: Adult Health

## 2017-10-25 NOTE — Telephone Encounter (Signed)
Neurology has prescribed the Gabapentin in the past. Is she still seeing them?    OK for needles

## 2017-10-25 NOTE — Telephone Encounter (Signed)
Cory, I do not see that you have prescribed these in the past.  Please advise.

## 2017-10-25 NOTE — Telephone Encounter (Signed)
Pt needs new rxs  b-d needles nano 32 g 1 box and gabapentin 300 mg #360 w/refills [facility] [address]

## 2017-10-27 MED ORDER — GABAPENTIN 300 MG PO CAPS: 300.0000 mg | ORAL_CAPSULE | Freq: Four times a day (QID) | ORAL | 1 refills | Status: DC

## 2017-10-27 MED ORDER — INSULIN PEN NEEDLE 32G X 4 MM MISC: 6 refills | Status: DC

## 2017-10-27 NOTE — Telephone Encounter (Signed)
Spoke to the pt.  She has not been able to see neuro due to cost of office visit.  Pt would like to know if you would be willing to fill.  Please advise.

## 2017-10-27 NOTE — Telephone Encounter (Signed)
Ok to refill for 6 months 

## 2017-10-27 NOTE — Telephone Encounter (Signed)
Sent to the pharmacy by e-scribe. 

## 2017-10-27 NOTE — Addendum Note (Signed)
Addended by: Miles Costain T on: 10/27/2017 04:42 PM   Modules accepted: Orders

## 2017-10-31 ENCOUNTER — Ambulatory Visit (INDEPENDENT_AMBULATORY_CARE_PROVIDER_SITE_OTHER): Payer: Medicare Other | Admitting: *Deleted

## 2017-10-31 DIAGNOSIS — J309 Allergic rhinitis, unspecified: Secondary | ICD-10-CM

## 2017-11-14 DIAGNOSIS — J3081 Allergic rhinitis due to animal (cat) (dog) hair and dander: Secondary | ICD-10-CM | POA: Diagnosis not present

## 2017-11-15 ENCOUNTER — Telehealth: Payer: Self-pay

## 2017-11-15 ENCOUNTER — Ambulatory Visit (INDEPENDENT_AMBULATORY_CARE_PROVIDER_SITE_OTHER): Payer: Medicare Other

## 2017-11-15 DIAGNOSIS — J309 Allergic rhinitis, unspecified: Secondary | ICD-10-CM

## 2017-11-15 DIAGNOSIS — J301 Allergic rhinitis due to pollen: Secondary | ICD-10-CM | POA: Diagnosis not present

## 2017-11-15 NOTE — Telephone Encounter (Signed)
Yes, please type up a letter stating that she is taking Spiriva for asthma, not COPD.  Spiriva is indicated for COPD but is also indicated for asthma.  Let me know in the letter is ready and I will sign it.  Thank you.

## 2017-11-15 NOTE — Telephone Encounter (Signed)
Dr. Verlin Fester Kimberly Lam came into the office today and stated that she is trying to get health insurance. She stated that they will not approve her because she is using a COPD medication. She wants to know if you could write a letter to explain to them that she does not have COPD and she is using this medication because she can not use the other inhalers. I have left the letter that she brought into the office on your desk.

## 2017-11-16 ENCOUNTER — Encounter: Payer: Self-pay | Admitting: *Deleted

## 2017-11-16 DIAGNOSIS — J3089 Other allergic rhinitis: Secondary | ICD-10-CM | POA: Diagnosis not present

## 2017-11-16 NOTE — Progress Notes (Signed)
MT VIALS MADE

## 2017-11-16 NOTE — Telephone Encounter (Signed)
Letter has been writing and on your desk to sign.

## 2017-11-20 ENCOUNTER — Other Ambulatory Visit: Payer: Self-pay | Admitting: Allergy and Immunology

## 2017-11-20 ENCOUNTER — Encounter: Payer: Self-pay | Admitting: Allergy and Immunology

## 2017-11-20 NOTE — Telephone Encounter (Signed)
Called patient and notified her that letter regarding her diagnosis asthma and not having COPD and also medical necessity of Spiriva was signed and ready for her to pick up.  Patient is coming to [place] office this afternoon to pick up letter.  Patient also requested a sample of Spiriva.  One Spiriva Respimat sample left up front with letter for patient to pick up.

## 2017-11-20 NOTE — Telephone Encounter (Signed)
Signed and submitted. Please let the patient know that it is ready. Thanks.

## 2017-11-20 NOTE — Telephone Encounter (Signed)
Patient notified that letter was signed and ready for pick up.  Patient also requested sample of Spiriva Respimat and one sample of Spiriva Respimat left up front with signed letter for patient to pick up this afternoon in [facility].

## 2017-11-24 ENCOUNTER — Telehealth: Payer: Self-pay | Admitting: Adult Health

## 2017-11-24 NOTE — Telephone Encounter (Signed)
Following up with pt in regards to prolia injection.  Wanted to schedule pt for infection of prolia  Pt has stated in the past that is was too expensive to have this type of tx, I provided pt with the [employer] [phone]) phone number to apply for gant to assist with the out of pocket expense of injection.  Left message on voicemail to call office.

## 2017-11-28 ENCOUNTER — Ambulatory Visit: Payer: Medicare Other | Admitting: Allergy and Immunology

## 2017-11-30 ENCOUNTER — Ambulatory Visit (INDEPENDENT_AMBULATORY_CARE_PROVIDER_SITE_OTHER): Payer: Medicare Other | Admitting: Family Medicine

## 2017-11-30 ENCOUNTER — Encounter: Payer: Self-pay | Admitting: Family Medicine

## 2017-11-30 VITALS — BP 122/76 | HR 78 | Ht 64.0 in | Wt 138.6 lb

## 2017-11-30 DIAGNOSIS — T7800XD Anaphylactic reaction due to unspecified food, subsequent encounter: Secondary | ICD-10-CM | POA: Diagnosis not present

## 2017-11-30 DIAGNOSIS — J3089 Other allergic rhinitis: Secondary | ICD-10-CM | POA: Diagnosis not present

## 2017-11-30 DIAGNOSIS — J4540 Moderate persistent asthma, uncomplicated: Secondary | ICD-10-CM | POA: Diagnosis not present

## 2017-11-30 MED ORDER — EPINEPHRINE 0.3 MG/0.3ML IJ SOAJ: 0.3000 mg | Freq: Once | INTRAMUSCULAR | 2 refills | Status: AC

## 2017-11-30 NOTE — Progress Notes (Signed)
Kimberly Lam [id number] Dept: [phone]  FAMILY NURSE PRACTITIONER FOLLOW UP NOTE  Patient ID: Kimberly Lam, female    DOB: [birth year]  Age: 71 y.o. MRN: [id number] Date of Office Visit: 11/30/2017  Assessment  Chief Complaint: Follow-up (Pt presents as a follow up for allergies and asthma. Pt states she will need to possibly switch to a different antihistamine.)  HPI Kimberly Lam is a 71 year old patient in the clinic for a follow up visit. She was last seen in this clinic by Dr. Verlin Fester on 04/24/2017 for evaluation of persistent asthma, allergic rhinitis, and food allergy. At that time she was using Qvar which was subsequently changed to Flovent and then Spiriva due to insurance and dissatisfaction with medication respectively.  Has also reported experiencing some nasal congestion despite the use of fluticasone nasal spray however she had not been using nasal saline prior to the fluticasone nasal spray.  She does report some confusion about how to use the Spiriva Respimat inhaler device.  At today's visit she reports her asthma is well controlled.  She is using the Spiriva Respimat daily and has not needed to use the ProAir rescue inhaler since her last visit.  She takes montelukast 10 mg daily at bedtime.  She denies cough, wheeze, shortness of breath, nighttime awakenings, and  limitation of daily activity.  She reports she has not been to the emergency department or urgent care nor has she needed antibiotics or prednisone to control her asthma since her last visit.  Allergic rhinitis is reported is not well controlled.  She reports her symptoms as stuffy at night and occasional headaches that are aggravated by dust and heat and alleviated with fluticasone nasal spray.  She is currently using fluticasone nasal spray 1 spray in the morning in each nostril and 1 spray in the evening in each nostril.  She uses a nasal spray as needed which she reports as a couple  times a week.  She takes Zyrtec 10 mg daily as she has for the last 5 years.  [name] began allergen immunotherapy at this clinic on 10/27/2015 for tree, grass, weed, dust mite, cat, dog, mold, and cockroach.  She reports now coming to the clinic every 2-3 weeks to receive a shot from the maintenance while with no adverse events.  She does not have an EpiPen that is within date.  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  She reports she has not had any accidental ingestions nor has she needed to use her EpiPen.   Drug Allergies:  Allergies  Allergen Reactions  . Peanut-Containing Drug Products Anaphylaxis  . Actos [Pioglitazone] Nausea Only  . Milk-Related Compounds Nausea And Vomiting    Milk and cheese  . Soy Allergy Nausea Only    Pt states had allergy testing and was told allergic to soy-causes nausea but no other reactions  . Vicodin [Hydrocodone-Acetaminophen] Nausea Only    nausea    Physical Exam: BP 122/76 (BP Location: Right Arm, Patient Position: Sitting, Cuff Size: Normal)   Pulse 78   Ht 5\' 4"  (1.626 m)   Wt 138 lb 9.6 oz (62.9 kg)   SpO2 97%   BMI 23.79 kg/m    Physical Exam  Constitutional: She is oriented to person, place, and time. She appears well-developed and well-nourished.  HENT:  Right Ear: External ear normal.  Left Ear: External ear normal.  Mouth/Throat: Oropharynx is clear and moist.  Bilateral nares erythematous  and edematous.  No drainage noted.  Eyes: Conjunctivae are normal.  Eyes normal.  No redness or drainage noted  Neck: Normal range of motion. Neck supple.  Cardiovascular: Normal rate, regular rhythm and normal heart sounds.  S1-S2 normal.  Regular heart rate and rhythm.  No murmur noted  Pulmonary/Chest: Effort normal and breath sounds normal.  Lungs clear to auscultation  Musculoskeletal: Normal range of motion.  Neurological: She is alert and oriented to person, place, and time.  Skin: Skin is warm and dry.    Psychiatric: She has a normal mood and affect. Her behavior is normal. Judgment and thought content normal.    Diagnostics: FEV1 1.80, FVC 2.08.  Predicted FEV1 1.85, predicted FVC 2.39.  Therefore spirometry is in the normal range.    Assessment and Plan: 1. Perennial and seasonal allergic rhinoconjunctivitis   2. Moderate persistent asthma, uncomplicated   3. Anaphylactic shock due to food, subsequent encounter     Meds ordered this encounter  Medications  . EPINEPHrine (EPIPEN 2-PAK) 0.3 mg/0.3 mL IJ SOAJ injection    Sig: Inject 0.3 mLs (0.3 mg total) into the muscle once for 1 dose.    Dispense:  2 Device    Refill:  2    Patient Instructions   1. Perennial and seasonal allergic rhinoconjunctivitis - Change cetirizine to a different daily antihistamine. Samples have been provided - Continue montelukast 10 mg daily - Continue allergy shots every 3 weeks (red vial) - Continue fluticasone nasal spray 1 spray in each nostril 2 times a day  - Azelastine nasal spray 1-2 sprays in each nostril twice a day as needed - Or you may use Dymista (Samples given) instead of fluticasone and Astelin - Begin nasal saline washes daily   2. Moderate persistent asthma, uncomplicated - Continue Spiriva 2 puffs a day to prevent cough or wheeze (Samples given). Education in the form return demonstration has been provided on the correct inhaler technique. - Use ProAir 2 puffs every 4 hours as needed for cough or wheeze  3. Anaphylactic shock due to food, subsequent encounter - Continue to avoid peanuts, cow's milk, soybean - Have access to a current EpiPen - Food allergy action plan is in place  4. Follow up in 6 months or sooner as needed    Thank you for the opportunity to care for this patient.  Please do not hesitate to contact me with questions.  Gareth Morgan, FNP [facility]

## 2017-11-30 NOTE — Patient Instructions (Addendum)
  1. Perennial and seasonal allergic rhinoconjunctivitis -  Change cetirizine to a different daily antihistamine. Samples have been provided - Continue montelukast 10 mg daily - Continue allergy shots every 3 weeks (red vial) - Continue fluticasone nasal spray 1 spray in each nostril 2 times a day  - Azelastine nasal spray 1-2 sprays in each nostril twice a day as needed - Or you may use Dymista instead of fluticasone and Astelin - Begin nasal saline washes daily   2. Moderate persistent asthma, uncomplicated - Continue Spiriva 2 puffs a day to prevent cough or wheeze - Use ProAir 2 puffs every 4 hours as needed for cough or wheeze  3. Anaphylactic shock due to food, subsequent encounter - Continue to avoid peanuts, cow's milk, soybean - Have access to a current EpiPen - Food allergy action plan is in place  4. Follow up in 6 months or sooner as needed

## 2017-12-05 ENCOUNTER — Other Ambulatory Visit: Payer: Self-pay

## 2017-12-05 MED ORDER — LEVOCETIRIZINE DIHYDROCHLORIDE 5 MG PO TABS: 5.0000 mg | ORAL_TABLET | Freq: Every evening | ORAL | 1 refills | Status: DC

## 2017-12-06 ENCOUNTER — Other Ambulatory Visit: Payer: Self-pay | Admitting: Adult Health

## 2017-12-06 ENCOUNTER — Other Ambulatory Visit: Payer: Self-pay | Admitting: Allergy and Immunology

## 2017-12-06 ENCOUNTER — Telehealth: Payer: Self-pay | Admitting: Neurology

## 2017-12-06 ENCOUNTER — Telehealth: Payer: Self-pay | Admitting: Internal Medicine

## 2017-12-06 NOTE — Telephone Encounter (Signed)
Received fax for 90 day supply of levocetirizine 5mg . Request was sent in yesterday.

## 2017-12-06 NOTE — Telephone Encounter (Signed)
Pt states that for her [employer]  She needs a letter from Dr Krista Blue stating why she is on gabapentin (NEURONTIN) 300 MG capsule, please call

## 2017-12-06 NOTE — Telephone Encounter (Signed)
Patient last seen in January 2017.  Her PCP is now prescribing her medications.  She will contact his office to request this letter.

## 2017-12-06 NOTE — Telephone Encounter (Signed)
Copied from [place] [id number]. Topic: Quick Communication - See Telephone Encounter >> Dec 06, 2017  5:03 PM Percell Belt A wrote: CRM for notification. See Telephone encounter for:  pt called in and is needing a letter stating why she is taking  gabapentin (NEURONTIN) 300 MG capsule [[id number].  (leg Cramps)  This document is for her life ins.    Pt would like for you to call her and she will pick it up   12/06/17.

## 2017-12-07 NOTE — Telephone Encounter (Signed)
Cory, you have not prescribed this medication in the past.  Please advise. 

## 2017-12-07 NOTE — Telephone Encounter (Signed)
Sent to the pharmacy by e-scribe. 

## 2017-12-07 NOTE — Telephone Encounter (Signed)
Ok to refill for one year  

## 2017-12-13 ENCOUNTER — Encounter: Payer: Self-pay | Admitting: Adult Health

## 2017-12-13 ENCOUNTER — Telehealth: Payer: Self-pay | Admitting: Adult Health

## 2017-12-13 ENCOUNTER — Ambulatory Visit (INDEPENDENT_AMBULATORY_CARE_PROVIDER_SITE_OTHER): Payer: Medicare Other

## 2017-12-13 DIAGNOSIS — J309 Allergic rhinitis, unspecified: Secondary | ICD-10-CM | POA: Diagnosis not present

## 2017-12-13 NOTE — Telephone Encounter (Signed)
Pt notified that letter is ready and that rx has been sent in.  No further action needed.  Will now close note.

## 2017-12-13 NOTE — Telephone Encounter (Signed)
mPt is in the office today to see if she can get Rx simvastatin 20 MG  #90 (Out of Medication) Pharm:  [facility] and pt would like to have a letter stating why she is on gabapentin 300 MG for [employer].

## 2017-12-13 NOTE — Telephone Encounter (Signed)
Simvastatin filled for 1 year on 12/07/17.  Will forward for [facility] to authorize letter.

## 2017-12-21 ENCOUNTER — Ambulatory Visit (INDEPENDENT_AMBULATORY_CARE_PROVIDER_SITE_OTHER): Payer: Medicare Other | Admitting: *Deleted

## 2017-12-21 ENCOUNTER — Telehealth: Payer: Self-pay | Admitting: Allergy and Immunology

## 2017-12-21 DIAGNOSIS — J309 Allergic rhinitis, unspecified: Secondary | ICD-10-CM

## 2017-12-21 NOTE — Telephone Encounter (Signed)
Pt is here to see if she can get a sample of spiriva. [phone].

## 2017-12-21 NOTE — Telephone Encounter (Signed)
No samples available 

## 2017-12-27 ENCOUNTER — Telehealth: Payer: Self-pay

## 2017-12-27 ENCOUNTER — Ambulatory Visit (INDEPENDENT_AMBULATORY_CARE_PROVIDER_SITE_OTHER): Payer: Medicare Other | Admitting: *Deleted

## 2017-12-27 DIAGNOSIS — J309 Allergic rhinitis, unspecified: Secondary | ICD-10-CM | POA: Diagnosis not present

## 2017-12-27 NOTE — Telephone Encounter (Signed)
Patient was calling to see if we had some samples of Dymista and Spiriva   Please Advise

## 2017-12-27 NOTE — Telephone Encounter (Signed)
There are no available Spiriva samples of 1.25 dose, however there are Dymista samples, Pt advised there will be samples available at the front desk for pickup.

## 2018-01-02 ENCOUNTER — Ambulatory Visit (INDEPENDENT_AMBULATORY_CARE_PROVIDER_SITE_OTHER): Payer: Medicare Other | Admitting: *Deleted

## 2018-01-02 DIAGNOSIS — J309 Allergic rhinitis, unspecified: Secondary | ICD-10-CM | POA: Diagnosis not present

## 2018-01-03 ENCOUNTER — Telehealth: Payer: Self-pay | Admitting: Adult Health

## 2018-01-03 NOTE — Telephone Encounter (Signed)
Informed pt that summary of benefits for Prolia came in stating that Pt is responsible for 20%  ( about $260 every 6 months) of cost of injection.  Pt was given [employer] information to apply for grant. Pt states she has a "ton of allergies and I am wary about the side effects"   Pt will call office if and when she decides she wants to get Prolia injections.

## 2018-01-09 ENCOUNTER — Ambulatory Visit (INDEPENDENT_AMBULATORY_CARE_PROVIDER_SITE_OTHER): Payer: Medicare Other | Admitting: *Deleted

## 2018-01-09 DIAGNOSIS — J309 Allergic rhinitis, unspecified: Secondary | ICD-10-CM

## 2018-01-11 ENCOUNTER — Ambulatory Visit: Payer: Medicare Other | Admitting: Podiatry

## 2018-01-16 ENCOUNTER — Ambulatory Visit (INDEPENDENT_AMBULATORY_CARE_PROVIDER_SITE_OTHER): Payer: Medicare Other | Admitting: *Deleted

## 2018-01-16 DIAGNOSIS — J309 Allergic rhinitis, unspecified: Secondary | ICD-10-CM

## 2018-01-18 ENCOUNTER — Other Ambulatory Visit: Payer: Self-pay

## 2018-01-18 MED ORDER — GABAPENTIN 300 MG PO CAPS: 300.0000 mg | ORAL_CAPSULE | Freq: Four times a day (QID) | ORAL | 1 refills | Status: DC

## 2018-01-18 NOTE — Telephone Encounter (Signed)
Medication sent in electronically.  

## 2018-01-23 ENCOUNTER — Ambulatory Visit: Payer: Medicare Other | Admitting: Neurology

## 2018-01-23 ENCOUNTER — Encounter: Payer: Self-pay | Admitting: Neurology

## 2018-01-23 VITALS — BP 131/70 | HR 76 | Ht 64.0 in | Wt 137.2 lb

## 2018-01-23 DIAGNOSIS — M5416 Radiculopathy, lumbar region: Secondary | ICD-10-CM | POA: Diagnosis not present

## 2018-01-23 DIAGNOSIS — R251 Tremor, unspecified: Secondary | ICD-10-CM

## 2018-01-23 MED ORDER — GABAPENTIN 300 MG PO CAPS: 300.0000 mg | ORAL_CAPSULE | Freq: Four times a day (QID) | ORAL | 4 refills | Status: DC

## 2018-01-23 NOTE — Progress Notes (Signed)
PATIENT: Kimberly Lam DOB: 11-20-1946  Chief Complaint  Patient presents with  . Leg cramping    Reports intermittent right leg cramping in calf and ankle.  Mostly occurs in the early morning and wakes her from sleep.  Her husband has been massaging her legs prior to bedtime and this has improved her symptoms.  . Tremors    She has been noticing a tremor in her right hand that is more pronounced when she writes.  It does not occur daily.     HISTORICAL  Kimberly Lam is a 72 years old female, seen in refer by her primary care nurse practitioner [name] for evaluation of leg cramping, tremors, initial evaluation was on January 23, 2018.    She has past medical history of hypertension, diabetes, hyperlipidemia  I saw her initially in 2016 for evaluation of low back pain, radiating pain to bilateral hip, bilateral leg muscle cramping, and weakness.  She has history of low back pain since 2006, midline low back, occasionally go to posterior thigh, go down below her knee, worsening with prolonged standing, or walking,  Previous her low back pain has responded very well to epidural injection, but the benefit gradually fades away, over the years, she complains of worsening low back pain, radiating pain to bilateral lower extremity again, in addition, she complains of bilateral feet paresthesia,   She had a history of diabetes, she denies gait difficulty, no bowel and bladder incontinence, she denies bilateral upper extremity paresthesia or weakness.   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   Note from Dr. Franciso Bend, [place] [facility] [facility], [place] in 2017, she was diagnosed with degenerative spondylolisthesis L4-L5, L5-S1, neurogenic claudication, low back pain, received epidural injection in Feb 2015, benefit lasted  about one year  MRI of lumbar spine December 2016, There is a transitional L6/S1 vertebral body. 8 mm of anterolisthesis of L5 upon L6/S1 of a degenerative nature due to severe facet hypertrophy. At this level, there is mild spinal stenosis and moderately severe right foraminal narrowing. There could be compression of the right L5 nerve root.  Degenerative changes at L6/S1-S2 lead to severe right foraminal narrowing that could lead to compression of the exiting right S1 nerve root  She continue complains of chronic low back pain, today she also complains of new onset right hand shaking, that was intermittent, getting worse when she holding utensils, writing, mild involvement on the left side, she continue complains of frequent lower extremity cramping especially on the right side, difficulty to walk in the morning.  There was no family history of tremor  REVIEW OF SYSTEMS: Full 14 system review of systems performed and notable only for as above  ALLERGIES: Allergies  Allergen Reactions  . Peanut-Containing Drug Products Anaphylaxis  . Actos [Pioglitazone] Nausea Only  . Milk-Related Compounds Nausea And Vomiting    Milk and cheese  . Soy Allergy Nausea Only    Pt states had allergy testing and was told allergic to soy-causes nausea but no other reactions  . Vicodin [Hydrocodone-Acetaminophen] Nausea Only    nausea    HOME MEDICATIONS: Current Outpatient Medications  Medication Sig Dispense Refill  . ACCU-CHEK FASTCLIX LANCETS MISC USE TO TEST BLOOD GLUCOSE 3-4 TIMES DAILY 120 each 5  . ACCU-CHEK GUIDE test strip     . albuterol (PROAIR HFA) 108 (90 Base)  MCG/ACT inhaler Inhale 2 puffs into the lungs every 4 (four) hours as needed for wheezing or shortness of breath. 3 Inhaler 0  . alendronate (FOSAMAX) 70 MG tablet Take 70 mg by mouth once a week. Take with a full glass of water on an empty stomach.    . Ascorbic Acid (VITAMIN C) 1000 MG tablet Take 1,000 mg by mouth as needed.    .  Azelastine HCl 0.15 % SOLN Place 2 sprays into both nostrils 2 (two) times daily. 90 mL 0  . baclofen (LIORESAL) 10 MG tablet Take 1 tablet (10 mg total) by mouth 2 (two) times daily. 180 each 3  . Barberry-Oreg Grape-Goldenseal (BERBERINE COMPLEX PO) Take by mouth daily.    . Blood Glucose Monitoring Suppl (ACCU-CHEK AVIVA PLUS) w/Device KIT     . cholecalciferol (VITAMIN D) 1000 units tablet Take 1,000 Units by mouth daily.    Marland Kitchen CINNAMON PO Take 1 tablet by mouth as needed.    . Cranberry 450 MG CAPS Take 1 capsule by mouth as needed.    . Cyanocobalamin (VITAMIN B-12 CR) 1500 MCG TBCR Take 1 tablet by mouth as needed.    . cycloSPORINE (RESTASIS) 0.05 % ophthalmic emulsion 1 drop 2 (two) times daily.    . Echinacea-Goldenseal (ECHINACEA COMB/GOLDEN SEAL PO) Take 1 tablet by mouth as needed. Reported on 12/16/2015    . fluticasone (FLONASE) 50 MCG/ACT nasal spray Place 1 spray into both nostrils 2 (two) times daily. 48 g 1  . gabapentin (NEURONTIN) 300 MG capsule Take 1 capsule (300 mg total) by mouth 4 (four) times daily. 360 capsule 1  . Green Tea, Camillia sinensis, (GREEN TEA PO) Take by mouth as needed. Reported on 12/16/2015    . Insulin Glargine (LANTUS SOLOSTAR) 100 UNIT/ML Solostar Pen Inject 14 Units into the skin daily at 10 pm. Inject 18-45 units at bedtime. 15 mL 3  . Insulin Pen Needle (BD PEN NEEDLE NANO U/F) 32G X 4 MM MISC USE TO TEST BLOOD GLUCOSE 4 TIMES DAILY 100 each 6  . Lactobacillus (ACIDOPHILUS PROBIOTIC PO) Take 1 tablet by mouth daily.    Marland Kitchen levocetirizine (XYZAL) 5 MG tablet Take 1 tablet (5 mg total) by mouth every evening. 90 tablet 1  . Melatonin 5 MG TABS Take 1 tablet by mouth as needed. Reported on 12/16/2015    . meloxicam (MOBIC) 15 MG tablet     . metFORMIN (GLUCOPHAGE) 500 MG tablet Take by mouth 2 (two) times daily with a meal.    . montelukast (SINGULAIR) 10 MG tablet Take 1 tablet (10 mg total) by mouth at bedtime. 90 tablet 1  . Omega-3 Fatty Acids  (OMEGA 3 PO) Take 1 tablet by mouth as needed.    Marland Kitchen omeprazole (PRILOSEC) 40 MG capsule Take 40 mg by mouth as needed.     . ramipril (ALTACE) 2.5 MG capsule Take 2.5 mg by mouth daily.    . simvastatin (ZOCOR) 20 MG tablet TAKE 1 TABLET BY MOUTH ONCE DAILY 90 tablet 3  . Spacer/Aero-Holding Chambers (AEROCHAMBER PLUS) inhaler Use as instructed with MDI 1 each 2  . Stinging Nettle 2 % POWD Take 1 tablet by mouth as needed. Reported on 12/16/2015    . Tiotropium Bromide Monohydrate (SPIRIVA RESPIMAT) 1.25 MCG/ACT AERS Inhale 2 puffs into the lungs daily. 4 g 3  . TURMERIC PO Take 800 mg by mouth as needed. Reported on 12/16/2015    . AMBULATORY NON FORMULARY MEDICATION Insulin Control Formula Take  1 tablet by mouth as needed     No current facility-administered medications for this visit.     PAST MEDICAL HISTORY: Past Medical History:  Diagnosis Date  . Acid reflux   . Allergic rhinitis   . Anemia   . Asthma   . Bilateral sciatica   . Chronic bilateral low back pain   . Colon polyps   . Diverticulosis   . DM type 2 (diabetes mellitus, type 2) ([place])   . Environmental allergies    flowers, pollen, trees  . GERD (gastroesophageal reflux disease)   . Hyperlipidemia   . Low back pain   . Migraine     PAST SURGICAL HISTORY: Past Surgical History:  Procedure Laterality Date  . CATARACT EXTRACTION, BILATERAL Bilateral 2012  . CESAREAN SECTION     x 2  . DILATION AND CURETTAGE OF UTERUS    . NASAL SINUS SURGERY    . TUBAL LIGATION      FAMILY HISTORY: Family History  Problem Relation Age of Onset  . Asthma Father   . Emphysema Father   . Alcohol abuse Father   . Diabetes Mellitus II Brother        x 2  . Hyperlipidemia Brother   . Hypertension Brother   . Pancreatic cancer Brother   . Heart disease Maternal Grandmother        hardening of the arteries  . Heart murmur Daughter   . Prostate cancer Brother   . Glaucoma Brother   . Breast cancer Neg Hx     SOCIAL  HISTORY:  Social History   Socioeconomic History  . Marital status: Married    Spouse name: Not on file  . Number of children: 2  . Years of education: 25  . Highest education level: Not on file  Social Needs  . Financial resource strain: Not on file  . Food insecurity - worry: Not on file  . Food insecurity - inability: Not on file  . Transportation needs - medical: Not on file  . Transportation needs - non-medical: Not on file  Occupational History  . Occupation: Retired  Tobacco Use  . Smoking status: Never Smoker  . Smokeless tobacco: Never Used  Substance and Sexual Activity  . Alcohol use: No    Alcohol/week: 0.0 oz  . Drug use: No  . Sexual activity: Not on file  Other Topics Concern  . Not on file  Social History Narrative   ** Merged History Encounter **       ** Data from: 11/23/15 Enc Dept: [facility]   Married 1 son one daughter, relocated to [place] after living in [place] for many years. She is a Writer of [employer]. No caffeine.       ** Data from: 11/25/15 Enc Dept: [facility]   Lives at home with her husband and brother. Right-handed. No caffeine use.     PHYSICAL EXAM   Vitals:   01/23/18 1511  BP: 131/70  Pulse: 76  Weight: 137 lb 4 oz (62.3 kg)  Height: _0  (1.626 m)    Not recorded      Body mass index is 23.56 kg/m.  PHYSICAL EXAMNIATION:  Gen: NAD, conversant, well nourised, obese, well groomed                     Cardiovascular: Regular rate rhythm, no peripheral edema, warm, nontender. Eyes: Conjunctivae clear without exudates or hemorrhage Neck: Supple,  no carotid bruits. Pulmonary: Clear to auscultation bilaterally   NEUROLOGICAL EXAM:  MENTAL STATUS: Speech:    Speech is normal; fluent and spontaneous with normal comprehension.  Cognition:     Orientation to time, place and person     Normal recent and remote memory     Normal Attention span and concentration     Normal  Language, naming, repeating,spontaneous speech     Fund of knowledge   CRANIAL NERVES: CN II: Visual fields are full to confrontation. Fundoscopic exam is normal with sharp discs and no vascular changes. Pupils are round equal and briskly reactive to light. CN III, IV, VI: extraocular movement are normal. No ptosis. CN V: Facial sensation is intact to pinprick in all 3 divisions bilaterally. Corneal responses are intact.  CN VII: Face is symmetric with normal eye closure and smile. CN VIII: Hearing is normal to rubbing fingers CN IX, X: Palate elevates symmetrically. Phonation is normal. CN XI: Head turning and shoulder shrug are intact CN XII: Tongue is midline with normal movements and no atrophy.  MOTOR: There is no pronator drift of out-stretched arms. Muscle bulk and tone are normal. Muscle strength is normal.  REFLEXES: Reflexes are 2+ and symmetric at the biceps, triceps, knees, and ankles. Plantar responses are flexor.  SENSORY: Intact to light touch, pinprick, positional sensation and vibratory sensation are intact in fingers and toes.  COORDINATION: Rapid alternating movements and fine finger movements are intact. There is no dysmetria on finger-to-nose and heel-knee-shin.    GAIT/STANCE: Nonantalgic, difficulty performing tandem walking   DIAGNOSTIC DATA (LABS, IMAGING, TESTING) - I reviewed patient records, labs, notes, testing and imaging myself where available.   ASSESSMENT AND PLAN  Tryphena Perkovich is a 72 y.o. female    Chronic low back pain, radiating pain to bilateral lower extremity             Consistent with lumbar stenosis             She has no significant gait difficulty, distal weakness,             After discussed with patient, she decided to hold of surgical evaluation,             I will refer her to pain management for epidural injection  She also has insulin-dependent diabetes, evidence of diabetic peripheral neuropathy    New onset  bilateral hands tremor       Most consistent with essential tremor      Continue to observe.           Marcial Pacas, M.D. Ph.D.  [facility] [address] [place], [place] [address] Ph: [phone] Fax: [phone]  CC: Referring Provider

## 2018-01-30 ENCOUNTER — Ambulatory Visit (INDEPENDENT_AMBULATORY_CARE_PROVIDER_SITE_OTHER): Payer: Medicare Other | Admitting: *Deleted

## 2018-01-30 DIAGNOSIS — J309 Allergic rhinitis, unspecified: Secondary | ICD-10-CM | POA: Diagnosis not present

## 2018-02-06 ENCOUNTER — Ambulatory Visit (INDEPENDENT_AMBULATORY_CARE_PROVIDER_SITE_OTHER): Payer: Medicare Other | Admitting: *Deleted

## 2018-02-06 DIAGNOSIS — J309 Allergic rhinitis, unspecified: Secondary | ICD-10-CM | POA: Diagnosis not present

## 2018-02-16 ENCOUNTER — Encounter: Payer: Self-pay | Admitting: Podiatry

## 2018-02-16 ENCOUNTER — Ambulatory Visit: Payer: Medicare Other | Admitting: Podiatry

## 2018-02-16 DIAGNOSIS — E1149 Type 2 diabetes mellitus with other diabetic neurological complication: Secondary | ICD-10-CM

## 2018-02-16 DIAGNOSIS — M79675 Pain in left toe(s): Secondary | ICD-10-CM

## 2018-02-16 DIAGNOSIS — B351 Tinea unguium: Secondary | ICD-10-CM

## 2018-02-16 DIAGNOSIS — M79674 Pain in right toe(s): Secondary | ICD-10-CM | POA: Diagnosis not present

## 2018-02-16 MED ORDER — CICLOPIROX 8 % EX SOLN: Freq: Every day | CUTANEOUS | 2 refills | Status: DC

## 2018-02-16 NOTE — Patient Instructions (Signed)
Ciclopirox nail solution What is this medicine? CICLOPIROX (sye kloe PEER ox) NAIL SOLUTION is an antifungal medicine. It used to treat fungal infections of the nails. This medicine may be used for other purposes; ask your health care provider or pharmacist if you have questions. COMMON BRAND NAME(S): CNL8, [place] What should I tell my health care provider before I take this medicine? They need to know if you have any of these conditions: -diabetes mellitus -history of seizures -HIV infection -immune system problems or organ transplant -large areas of burned or damaged skin -peripheral vascular disease or poor circulation -taking corticosteroid medication (including steroid inhalers, cream, or lotion) -an unusual or allergic reaction to ciclopirox, isopropyl alcohol, other medicines, foods, dyes, or preservatives -pregnant or trying to get pregnant -breast-feeding How should I use this medicine? This medicine is for external use only. Follow the directions that come with this medicine exactly. Wash and dry your hands before use. Avoid contact with the eyes, mouth or nose. If you do get this medicine in your eyes, rinse out with plenty of cool tap water. Contact your doctor or health care professional if eye irritation occurs. Use at regular intervals. Do not use your medicine more often than directed. Finish the full course prescribed by your doctor or health care professional even if you think you are better. Do not stop using except on your doctor's advice. Talk to your pediatrician regarding the use of this medicine in children. While this medicine may be prescribed for children as young as 12 years for selected conditions, precautions do apply. Overdosage: If you think you have taken too much of this medicine contact a poison control center or emergency room at once. NOTE: This medicine is only for you. Do not share this medicine with others. What if I miss a dose? If you miss a dose, use  it as soon as you can. If it is almost time for your next dose, use only that dose. Do not use double or extra doses. What may interact with this medicine? Interactions are not expected. Do not use any other skin products without telling your doctor or health care professional. This list may not describe all possible interactions. Give your health care provider a list of all the medicines, herbs, non-prescription drugs, or dietary supplements you use. Also tell them if you smoke, drink alcohol, or use illegal drugs. Some items may interact with your medicine. What should I watch for while using this medicine? Tell your doctor or health care professional if your symptoms get worse. Four to six months of treatment may be needed for the nail(s) to improve. Some people may not achieve a complete cure or clearing of the nails by this time. Tell your doctor or health care professional if you develop sores or blisters that do not heal properly. If your nail infection returns after stopping using this product, contact your doctor or health care professional. What side effects may I notice from receiving this medicine? Side effects that you should report to your doctor or health care professional as soon as possible: -allergic reactions like skin rash, itching or hives, swelling of the face, lips, or tongue -severe irritation, redness, burning, blistering, peeling, swelling, oozing Side effects that usually do not require medical attention (report to your doctor or health care professional if they continue or are bothersome): -mild reddening of the skin -nail discoloration -temporary burning or mild stinging at the site of application This list may not describe all possible side effects.   Call your doctor for medical advice about side effects. You may report side effects to FDA at 1-800-FDA-1088. Where should I keep my medicine? Keep out of the reach of children. Store at room temperature between 15 and 30  degrees C (59 and 86 degrees F). Do not freeze. Protect from light by storing the bottle in the carton after every use. This medicine is flammable. Keep away from heat and flame. Throw away any unused medicine after the expiration date. NOTE: This sheet is a summary. It may not cover all possible information. If you have questions about this medicine, talk to your doctor, pharmacist, or health care provider.  2018 Elsevier/Gold Standard (2008-03-10 16:49:20)  

## 2018-02-20 ENCOUNTER — Ambulatory Visit (INDEPENDENT_AMBULATORY_CARE_PROVIDER_SITE_OTHER): Payer: Medicare Other | Admitting: *Deleted

## 2018-02-20 DIAGNOSIS — J309 Allergic rhinitis, unspecified: Secondary | ICD-10-CM | POA: Diagnosis not present

## 2018-02-21 NOTE — Progress Notes (Signed)
Subjective: 72 y.o. returns the office today for painful, elongated, thickened toenails which she cannot trim herself. Denies any redness or drainage around the nails. Denies any acute changes since last appointment and no new complaints today. Denies any systemic complaints such as fevers, chills, nausea, vomiting.   PCP: [name], NP  Objective: AAO 3, NAD DP/PT pulses palpable, CRT less than 3 seconds Nails hypertrophic, dystrophic, elongated, brittle, discolored 10. There is tenderness overlying the nails 1-5 bilaterally. There is no surrounding erythema or drainage along the nail sites. No open lesions or pre-ulcerative lesions are identified. No other areas of tenderness bilateral lower extremities. No overlying edema, erythema, increased warmth. No pain with calf compression, swelling, warmth, erythema.  Assessment: Patient presents with symptomatic onychomycosis  Plan: -Treatment options including alternatives, risks, complications were discussed -Nails sharply debrided 10 without complication/bleeding. -Discussed daily foot inspection. If there are any changes, to call the office immediately.  -Follow-up in 3 months or sooner if any problems are to arise. In the meantime, encouraged to call the office with any questions, concerns, changes symptoms.  Celesta Gentile, DPM

## 2018-02-27 ENCOUNTER — Telehealth: Payer: Self-pay | Admitting: *Deleted

## 2018-02-27 MED ORDER — GABAPENTIN 300 MG PO CAPS: 300.0000 mg | ORAL_CAPSULE | Freq: Four times a day (QID) | ORAL | 0 refills | Status: DC

## 2018-02-27 NOTE — Telephone Encounter (Signed)
Patient came in this morning.  She thought she had an appt.  Needs Gabapentin refilled. Please send to [facility].   Call if questions.

## 2018-02-27 NOTE — Telephone Encounter (Signed)
Patient has pending appt this month.  Rx sent to [facility].

## 2018-03-01 NOTE — Progress Notes (Signed)
VIALS EXP 03-02-19 

## 2018-03-06 ENCOUNTER — Ambulatory Visit (INDEPENDENT_AMBULATORY_CARE_PROVIDER_SITE_OTHER): Payer: Medicare Other | Admitting: *Deleted

## 2018-03-06 DIAGNOSIS — J309 Allergic rhinitis, unspecified: Secondary | ICD-10-CM | POA: Diagnosis not present

## 2018-03-06 NOTE — Progress Notes (Signed)
I reviewed the Nurse Practitioner's note and agree with the documented findings and plan of care. We briefly discussed the patient and developed a plan concurrently.   Salvatore Marvel, MD [facility] [place] [facility] [place]

## 2018-03-07 DIAGNOSIS — J3089 Other allergic rhinitis: Secondary | ICD-10-CM | POA: Diagnosis not present

## 2018-03-08 DIAGNOSIS — J301 Allergic rhinitis due to pollen: Secondary | ICD-10-CM | POA: Diagnosis not present

## 2018-03-09 DIAGNOSIS — J3081 Allergic rhinitis due to animal (cat) (dog) hair and dander: Secondary | ICD-10-CM | POA: Diagnosis not present

## 2018-03-14 ENCOUNTER — Ambulatory Visit (INDEPENDENT_AMBULATORY_CARE_PROVIDER_SITE_OTHER): Payer: Medicare Other | Admitting: Adult Health

## 2018-03-14 ENCOUNTER — Encounter: Payer: Self-pay | Admitting: Adult Health

## 2018-03-14 VITALS — BP 110/60 | Temp 98.3°F | Wt 133.0 lb

## 2018-03-14 DIAGNOSIS — E119 Type 2 diabetes mellitus without complications: Secondary | ICD-10-CM

## 2018-03-14 LAB — POCT GLYCOSYLATED HEMOGLOBIN (HGB A1C): Hemoglobin A1C: 6.7

## 2018-03-14 NOTE — Progress Notes (Signed)
Subjective:    Patient ID: [name], female    DOB: 28-Dec-1945, 72 y.o.   MRN: [id number]  HPI  72 year old female who  has a past medical history of Acid reflux, Allergic rhinitis, Anemia, Asthma, Bilateral sciatica, Chronic bilateral low back pain, Colon polyps, Diverticulosis, DM type 2 (diabetes mellitus, type 2) ([place]), Environmental allergies, GERD (gastroesophageal reflux disease), Hyperlipidemia, Low back pain, and Migraine.  She presents to the office today for follow up regarding diabetes mellitus. Her last A1c was 6.1 in September. She is currently maintained on Lantus 14 units QHS ( titrate's on her own) and Metformin 1000 BID. She has been eating a heart healthy diet. She reports that over the last two days her BS have been higher than normal in the morning, she reports readings of 120  Denies any hypoglycemic events   Review of Systems See HPI   Past Medical History:  Diagnosis Date  . Acid reflux   . Allergic rhinitis   . Anemia   . Asthma   . Bilateral sciatica   . Chronic bilateral low back pain   . Colon polyps   . Diverticulosis   . DM type 2 (diabetes mellitus, type 2) ([place])   . Environmental allergies    flowers, pollen, trees  . GERD (gastroesophageal reflux disease)   . Hyperlipidemia   . Low back pain   . Migraine     Social History   Socioeconomic History  . Marital status: Married    Spouse name: Not on file  . Number of children: 2  . Years of education: 53  . Highest education level: Not on file  Occupational History  . Occupation: Retired  Scientific laboratory technician  . Financial resource strain: Not on file  . Food insecurity:    Worry: Not on file    Inability: Not on file  . Transportation needs:    Medical: Not on file    Non-medical: Not on file  Tobacco Use  . Smoking status: Never Smoker  . Smokeless tobacco: Never Used  Substance and Sexual Activity  . Alcohol use: No    Alcohol/week: 0.0 oz  . Drug use: No  . Sexual  activity: Not on file  Lifestyle  . Physical activity:    Days per week: Not on file    Minutes per session: Not on file  . Stress: Not on file  Relationships  . Social connections:    Talks on phone: Not on file    Gets together: Not on file    Attends religious service: Not on file    Active member of club or organization: Not on file    Attends meetings of clubs or organizations: Not on file    Relationship status: Not on file  . Intimate partner violence:    Fear of current or ex partner: Not on file    Emotionally abused: Not on file    Physically abused: Not on file    Forced sexual activity: Not on file  Other Topics Concern  . Not on file  Social History Narrative   ** Merged History Encounter **       ** Data from: 11/23/15 Enc Dept: [facility]   Married 1 son one daughter, relocated to [place] after living in [place] for many years. 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 No caffeine.       ** Data from: 11/25/15 Enc Dept: [facility]  Lives at home with her husband and brother. Right-handed. No caffeine use.    Past Surgical History:  Procedure Laterality Date  . CATARACT EXTRACTION, BILATERAL Bilateral 2012  . CESAREAN SECTION     x 2  . DILATION AND CURETTAGE OF UTERUS    . NASAL SINUS SURGERY    . TUBAL LIGATION      Family History  Problem Relation Age of Onset  . Asthma Father   . Emphysema Father   . Alcohol abuse Father   . Diabetes Mellitus II Brother        x 2  . Hyperlipidemia Brother   . Hypertension Brother   . Pancreatic cancer Brother   . Heart disease Maternal Grandmother        hardening of the arteries  . Heart murmur Daughter   . Prostate cancer Brother   . Glaucoma Brother   . Breast cancer Neg Hx     Allergies  Allergen Reactions  . Peanut-Containing Drug Products Anaphylaxis  . Actos [Pioglitazone] Nausea Only  . Milk-Related Compounds Nausea And Vomiting    Milk and cheese  . Soy  Allergy Nausea Only    Pt states had allergy testing and was told allergic to soy-causes nausea but no other reactions  . Vicodin [Hydrocodone-Acetaminophen] Nausea Only    nausea    Current Outpatient Medications on File Prior to Visit  Medication Sig Dispense Refill  . ACCU-CHEK FASTCLIX LANCETS MISC USE TO TEST BLOOD GLUCOSE 3-4 TIMES DAILY 120 each 5  . ACCU-CHEK GUIDE test strip     . alendronate (FOSAMAX) 70 MG tablet Take 70 mg by mouth once a week. Take with a full glass of water on an empty stomach.    . Ascorbic Acid (VITAMIN C) 1000 MG tablet Take 1,000 mg by mouth as needed.    . Azelastine HCl 0.15 % SOLN Place 2 sprays into both nostrils 2 (two) times daily. 90 mL 0  . baclofen (LIORESAL) 10 MG tablet Take 1 tablet (10 mg total) by mouth 2 (two) times daily. 180 each 3  . Barberry-Oreg Grape-Goldenseal (BERBERINE COMPLEX PO) Take by mouth daily.    . Blood Glucose Monitoring Suppl (ACCU-CHEK AVIVA PLUS) w/Device KIT     . cholecalciferol (VITAMIN D) 1000 units tablet Take 1,000 Units by mouth daily.    Marland Kitchen CINNAMON PO Take 1 tablet by mouth as needed.    . Cranberry 450 MG CAPS Take 1 capsule by mouth as needed.    . Cyanocobalamin (VITAMIN B-12 CR) 1500 MCG TBCR Take 1 tablet by mouth as needed.    . cycloSPORINE (RESTASIS) 0.05 % ophthalmic emulsion 1 drop 2 (two) times daily.    . Echinacea-Goldenseal (ECHINACEA COMB/GOLDEN SEAL PO) Take 1 tablet by mouth as needed. Reported on 12/16/2015    . fluticasone (FLONASE) 50 MCG/ACT nasal spray Place 1 spray into both nostrils 2 (two) times daily. 48 g 1  . gabapentin (NEURONTIN) 300 MG capsule Take 1 capsule (300 mg total) by mouth 4 (four) times daily. 360 capsule 0  . Green Tea, Camillia sinensis, (GREEN TEA PO) Take by mouth as needed. Reported on 12/16/2015    . Insulin Glargine (LANTUS SOLOSTAR) 100 UNIT/ML Solostar Pen Inject 14 Units into the skin daily at 10 pm. Inject 18-45 units at bedtime. 15 mL 3  . Insulin Pen Needle  (BD PEN NEEDLE NANO U/F) 32G X 4 MM MISC USE TO TEST BLOOD GLUCOSE 4 TIMES DAILY 100 each 6  .  Lactobacillus (ACIDOPHILUS PROBIOTIC PO) Take 1 tablet by mouth daily.    Marland Kitchen levocetirizine (XYZAL) 5 MG tablet Take 1 tablet (5 mg total) by mouth every evening. 90 tablet 1  . Melatonin 5 MG TABS Take 1 tablet by mouth as needed. Reported on 12/16/2015    . meloxicam (MOBIC) 15 MG tablet     . metFORMIN (GLUCOPHAGE) 500 MG tablet Take 1,000 mg by mouth 2 (two) times daily with a meal.     . montelukast (SINGULAIR) 10 MG tablet Take 1 tablet (10 mg total) by mouth at bedtime. 90 tablet 1  . Omega-3 Fatty Acids (OMEGA 3 PO) Take 1 tablet by mouth as needed.    Marland Kitchen omeprazole (PRILOSEC) 40 MG capsule Take 40 mg by mouth as needed.     . ramipril (ALTACE) 2.5 MG capsule Take 2.5 mg by mouth daily.    . simvastatin (ZOCOR) 20 MG tablet TAKE 1 TABLET BY MOUTH ONCE DAILY 90 tablet 3  . Spacer/Aero-Holding Chambers (AEROCHAMBER PLUS) inhaler Use as instructed with MDI 1 each 2  . Stinging Nettle 2 % POWD Take 1 tablet by mouth as needed. Reported on 12/16/2015    . TURMERIC PO Take 800 mg by mouth as needed. Reported on 12/16/2015    . ciclopirox (PENLAC) 8 % solution Apply topically at bedtime. Apply over nail and surrounding skin. Apply daily over previous coat. After seven (7) days, may remove with alcohol and continue cycle. (Patient not taking: Reported on 03/14/2018) 6.6 mL 2   No current facility-administered medications on file prior to visit.     BP 110/60 (BP Location: Left Arm)   Temp 98.3 F (36.8 C) (Oral)   Wt 133 lb (60.3 kg)   BMI 22.83 kg/m       Objective:   Physical Exam  Constitutional: She is oriented to person, place, and time. She appears well-developed and well-nourished. No distress.  Cardiovascular: Normal rate, regular rhythm, normal heart sounds and intact distal pulses. Exam reveals no gallop and no friction rub.  No murmur heard. Pulmonary/Chest: Effort normal and breath  sounds normal. No respiratory distress. She has no wheezes. She has no rales. She exhibits no tenderness.  Neurological: She is alert and oriented to person, place, and time.  Skin: Skin is warm and dry. No rash noted. She is not diaphoretic. No erythema. No pallor.  Psychiatric: She has a normal mood and affect. Her behavior is normal. Judgment and thought content normal.  Nursing note and vitals reviewed.     Assessment & Plan:  1. Diabetes mellitus without complication ([place])  - POCT A1C - 6.7  - Has increased. Will continue to monitor.  - No change in medications at this time  - Will follow up in 3 months at [place], NP

## 2018-03-16 ENCOUNTER — Other Ambulatory Visit: Payer: Self-pay | Admitting: Adult Health

## 2018-03-20 ENCOUNTER — Ambulatory Visit (INDEPENDENT_AMBULATORY_CARE_PROVIDER_SITE_OTHER): Payer: Medicare Other | Admitting: *Deleted

## 2018-03-20 DIAGNOSIS — J309 Allergic rhinitis, unspecified: Secondary | ICD-10-CM | POA: Diagnosis not present

## 2018-03-20 NOTE — Telephone Encounter (Signed)
Ok to refill. Test TID

## 2018-03-20 NOTE — Telephone Encounter (Signed)
Cory, I do not see that you have prescribed this.  Ok to fill?  How many times a day do you want her to test?

## 2018-03-20 NOTE — Telephone Encounter (Signed)
Sent to the pharmacy by e-scribe as directed. 

## 2018-03-27 ENCOUNTER — Ambulatory Visit (INDEPENDENT_AMBULATORY_CARE_PROVIDER_SITE_OTHER): Payer: Medicare Other | Admitting: *Deleted

## 2018-03-27 ENCOUNTER — Other Ambulatory Visit: Payer: Self-pay

## 2018-03-27 ENCOUNTER — Telehealth: Payer: Self-pay | Admitting: Allergy & Immunology

## 2018-03-27 DIAGNOSIS — J309 Allergic rhinitis, unspecified: Secondary | ICD-10-CM | POA: Diagnosis not present

## 2018-03-27 MED ORDER — AZELASTINE-FLUTICASONE 137-50 MCG/ACT NA SUSP: 2.0000 | Freq: Two times a day (BID) | NASAL | 5 refills | Status: DC

## 2018-03-27 NOTE — Telephone Encounter (Signed)
Sent in rx and lm for pt to call us back. 

## 2018-03-27 NOTE — Telephone Encounter (Signed)
Patient walked in to office and ask about this and I informed her the prescription had been sent in. She said ok.

## 2018-03-27 NOTE — Telephone Encounter (Signed)
Pt called to see if she could get sample of Dymista  Or [facility] [address]. And she said that she could not use the spiriva. [phone].

## 2018-03-30 ENCOUNTER — Other Ambulatory Visit: Payer: Self-pay | Admitting: Neurology

## 2018-04-03 ENCOUNTER — Ambulatory Visit (INDEPENDENT_AMBULATORY_CARE_PROVIDER_SITE_OTHER): Payer: Medicare Other | Admitting: *Deleted

## 2018-04-03 ENCOUNTER — Telehealth: Payer: Self-pay

## 2018-04-03 DIAGNOSIS — J309 Allergic rhinitis, unspecified: Secondary | ICD-10-CM

## 2018-04-03 MED ORDER — AZELASTINE HCL 0.1 % NA SOLN: 2.0000 | Freq: Two times a day (BID) | NASAL | 5 refills | Status: DC

## 2018-04-03 MED ORDER — FLUTICASONE PROPIONATE 50 MCG/ACT NA SUSP: 2.0000 | Freq: Two times a day (BID) | NASAL | 5 refills | Status: DC

## 2018-04-05 ENCOUNTER — Telehealth: Payer: Self-pay | Admitting: Adult Health

## 2018-04-05 NOTE — Telephone Encounter (Signed)
Will route to office for final disposition; LOV 09/14/17 with [name]

## 2018-04-05 NOTE — Telephone Encounter (Signed)
Patient called and advised [facility], Astelin nasal spray and Flonase, she verbalized understanding.

## 2018-04-05 NOTE — Telephone Encounter (Signed)
Copied from [place] [id number]. Topic: Quick Communication - Rx Refill/Question >> Apr 05, 2018  3:11 PM Boyd Kerbs wrote:  Medication:  gabapentin (NEURONTIN) 300 MG capsule simvastatin (ZOCOR) 20 MG tablet metFORMIN (GLUCOPHAGE) 500 MG tablet alendronate (FOSAMAX) 70 MG tablet meloxicam (MOBIC) 15 MG tablet baclofen (LIORESAL) 10 MG tablet  Has the patient contacted their pharmacy? No. (Agent: If no, request that the patient contact the pharmacy for the refill.) Preferred Pharmacy (with phone number or street name):   [place], [place] [facility] [address] [place] [address] [place] [address] Phone: [phone] Fax: [phone]   Agent: Please be advised that RX refills may take up to 3 business days. We ask that you follow-up with your pharmacy.

## 2018-04-05 NOTE — Telephone Encounter (Signed)
Azelastine HCl 0.15 % SOLN [facility] [address]   [id number] [place] [place] [address]  [place] [place] [address]  Phone: [phone] Fax: [phone]

## 2018-04-09 NOTE — Telephone Encounter (Signed)
Kimberly Lam, reviewed below medications.  You have not filled metformin, simvastatin, Fosamax, baclofen or meloxicam in the past.  Please advise.  Gabapentin last filled by Dr. Krista Blue (neuro) on 02/2018 to [facility].  Are you taking over this medication?  Please advise.  Thanks!!

## 2018-04-10 MED ORDER — MELOXICAM 15 MG PO TABS: 15.0000 mg | ORAL_TABLET | Freq: Every day | ORAL | 0 refills | Status: DC

## 2018-04-10 MED ORDER — SIMVASTATIN 20 MG PO TABS: 20.0000 mg | ORAL_TABLET | Freq: Every day | ORAL | 0 refills | Status: DC

## 2018-04-10 MED ORDER — BACLOFEN 10 MG PO TABS: 10.0000 mg | ORAL_TABLET | Freq: Two times a day (BID) | ORAL | 0 refills | Status: DC

## 2018-04-10 MED ORDER — ALENDRONATE SODIUM 70 MG PO TABS: 70.0000 mg | ORAL_TABLET | ORAL | 0 refills | Status: DC

## 2018-04-10 MED ORDER — METFORMIN HCL 500 MG PO TABS: 1000.0000 mg | ORAL_TABLET | Freq: Two times a day (BID) | ORAL | 0 refills | Status: DC

## 2018-04-10 NOTE — Telephone Encounter (Signed)
Medication sent to the pharmacy by e-scribe.

## 2018-04-10 NOTE — Addendum Note (Signed)
Addended by: Miles Costain T on: 04/10/2018 10:45 AM   Modules accepted: Orders

## 2018-04-10 NOTE — Telephone Encounter (Signed)
I am ok with 90 days of each medication. If she continues to see Neurology then they can continue to fill

## 2018-04-11 ENCOUNTER — Telehealth: Payer: Self-pay | Admitting: Adult Health

## 2018-04-11 NOTE — Telephone Encounter (Signed)
Copied from [place] [id number]. Topic: Quick Communication - See Telephone Encounter >> Apr 11, 2018  3:42 PM Ivar Drape wrote: CRM for notification. See Telephone encounter for: 04/11/18. [facility]  Ph:  [phone], Ref# [id number] would like a prescription for gabapentin (NEURONTIN) 300 MG capsule faxed: [phone] to them or electronically sent.

## 2018-04-13 NOTE — Telephone Encounter (Signed)
Med ordered by Dr Krista Blue - called pt and asked her to call her office to have Gabapentin refilled

## 2018-04-17 ENCOUNTER — Telehealth: Payer: Self-pay | Admitting: Allergy and Immunology

## 2018-04-17 ENCOUNTER — Ambulatory Visit (INDEPENDENT_AMBULATORY_CARE_PROVIDER_SITE_OTHER): Payer: Medicare Other | Admitting: *Deleted

## 2018-04-17 DIAGNOSIS — J309 Allergic rhinitis, unspecified: Secondary | ICD-10-CM | POA: Diagnosis not present

## 2018-04-17 NOTE — Telephone Encounter (Signed)
Requesting a sample for Dymista, azalestin, and allegra.

## 2018-04-18 NOTE — Telephone Encounter (Addendum)
Spoke to patient advised we only had allegra samples. Advised patient we did split nasal sprays 2 weeks ago. States she did pick those up however azelastine is expensive. Advised patient that azelastine is a component of Dymista. Patient verbalized understanding. She will try to get sprays when she can afford them.

## 2018-04-21 ENCOUNTER — Other Ambulatory Visit: Payer: Self-pay | Admitting: Adult Health

## 2018-04-21 ENCOUNTER — Other Ambulatory Visit: Payer: Self-pay | Admitting: Neurology

## 2018-04-21 ENCOUNTER — Other Ambulatory Visit: Payer: Self-pay | Admitting: Allergy and Immunology

## 2018-04-24 NOTE — Telephone Encounter (Signed)
DENIED.  SENT TO [facility] FOR 90 DAY SUPPLY ON 04/10/18.

## 2018-04-24 NOTE — Telephone Encounter (Signed)
LAST FILLED ON 04/10/18 FOR 90 DAYS.

## 2018-04-26 ENCOUNTER — Ambulatory Visit (INDEPENDENT_AMBULATORY_CARE_PROVIDER_SITE_OTHER): Payer: Medicare Other | Admitting: *Deleted

## 2018-04-26 ENCOUNTER — Encounter: Payer: Self-pay | Admitting: Adult Health

## 2018-04-26 ENCOUNTER — Other Ambulatory Visit: Payer: Self-pay | Admitting: Adult Health

## 2018-04-26 DIAGNOSIS — J309 Allergic rhinitis, unspecified: Secondary | ICD-10-CM | POA: Diagnosis not present

## 2018-04-27 NOTE — Telephone Encounter (Signed)
Sent to [facility] on 04/10/18 for 90 days.  Refill request is too early.

## 2018-05-01 ENCOUNTER — Other Ambulatory Visit: Payer: Self-pay | Admitting: Adult Health

## 2018-05-01 NOTE — Telephone Encounter (Signed)
Denied.  Medication was sent to mail order pharmacy on 04/10/18

## 2018-05-02 ENCOUNTER — Ambulatory Visit (INDEPENDENT_AMBULATORY_CARE_PROVIDER_SITE_OTHER): Payer: Medicare Other | Admitting: *Deleted

## 2018-05-02 DIAGNOSIS — J309 Allergic rhinitis, unspecified: Secondary | ICD-10-CM | POA: Diagnosis not present

## 2018-05-14 ENCOUNTER — Encounter: Payer: Self-pay | Admitting: Adult Health

## 2018-05-15 ENCOUNTER — Ambulatory Visit (INDEPENDENT_AMBULATORY_CARE_PROVIDER_SITE_OTHER): Payer: Medicare Other | Admitting: *Deleted

## 2018-05-15 DIAGNOSIS — J309 Allergic rhinitis, unspecified: Secondary | ICD-10-CM | POA: Diagnosis not present

## 2018-05-25 ENCOUNTER — Encounter: Payer: Self-pay | Admitting: Adult Health

## 2018-05-26 ENCOUNTER — Other Ambulatory Visit: Payer: Self-pay | Admitting: Adult Health

## 2018-05-29 ENCOUNTER — Ambulatory Visit (INDEPENDENT_AMBULATORY_CARE_PROVIDER_SITE_OTHER): Payer: Medicare Other | Admitting: *Deleted

## 2018-05-29 DIAGNOSIS — J309 Allergic rhinitis, unspecified: Secondary | ICD-10-CM

## 2018-05-29 NOTE — Telephone Encounter (Signed)
Sent to the pharmacy by e-scribe. 

## 2018-05-29 NOTE — Telephone Encounter (Signed)
Ok to refill for 90 days. She has an upcoming appointment

## 2018-05-31 ENCOUNTER — Other Ambulatory Visit: Payer: Self-pay | Admitting: Adult Health

## 2018-05-31 ENCOUNTER — Ambulatory Visit: Payer: Self-pay | Admitting: Adult Health

## 2018-05-31 NOTE — Telephone Encounter (Signed)
Denied.  This prescription was sent to mail order.  Message sent to the pharmacy.

## 2018-06-01 ENCOUNTER — Ambulatory Visit (INDEPENDENT_AMBULATORY_CARE_PROVIDER_SITE_OTHER): Payer: Medicare Other | Admitting: Adult Health

## 2018-06-01 ENCOUNTER — Encounter: Payer: Self-pay | Admitting: Adult Health

## 2018-06-01 VITALS — BP 110/54 | Temp 98.5°F | Wt 138.0 lb

## 2018-06-01 DIAGNOSIS — G4709 Other insomnia: Secondary | ICD-10-CM

## 2018-06-01 MED ORDER — TRAZODONE HCL 50 MG PO TABS: 25.0000 mg | ORAL_TABLET | Freq: Every evening | ORAL | 3 refills | Status: DC | PRN

## 2018-06-01 NOTE — Progress Notes (Signed)
Subjective:    Patient ID: Kimberly Lam, female    DOB: [birth year], 72 y.o.   MRN: [id number]  HPI  72 year old female who  has a past medical history of Acid reflux, Allergic rhinitis, Anemia, Asthma, Bilateral sciatica, Chronic bilateral low back pain, Colon polyps, Diverticulosis, DM type 2 (diabetes mellitus, type 2) ([place]), Environmental allergies, GERD (gastroesophageal reflux disease), Hyperlipidemia, Low back pain, and Migraine.  She presents to the office today for chronic issue of insomnia. This has been a long standing issue in the past for the patient. She reports that she is having trouble falling asleep and staying asleep   She often watches TV in bed or uses a phone and tablet. If she is unable to sleep she will get out of bed and read until she is tired again and then try and sleep, lately she has ben repeating this cycle many times a night.   Review of Systems See HPI   Past Medical History:  Diagnosis Date  . Acid reflux   . Allergic rhinitis   . Anemia   . Asthma   . Bilateral sciatica   . Chronic bilateral low back pain   . Colon polyps   . Diverticulosis   . DM type 2 (diabetes mellitus, type 2) ([place])   . Environmental allergies    flowers, pollen, trees  . GERD (gastroesophageal reflux disease)   . Hyperlipidemia   . Low back pain   . Migraine     Social History   Socioeconomic History  . Marital status: Married    Spouse name: Not on file  . Number of children: 2  . Years of education: 54  . Highest education level: Not on file  Occupational History  . Occupation: Retired  Scientific laboratory technician  . Financial resource strain: Not on file  . Food insecurity:    Worry: Not on file    Inability: Not on file  . Transportation needs:    Medical: Not on file    Non-medical: Not on file  Tobacco Use  . Smoking status: Never Smoker  . Smokeless tobacco: Never Used  Substance and Sexual Activity  . Alcohol use: No    Alcohol/week: 0.0 oz  . Drug  use: No  . Sexual activity: Not on file  Lifestyle  . Physical activity:    Days per week: Not on file    Minutes per session: Not on file  . Stress: Not on file  Relationships  . Social connections:    Talks on phone: Not on file    Gets together: Not on file    Attends religious service: Not on file    Active member of club or organization: Not on file    Attends meetings of clubs or organizations: Not on file    Relationship status: Not on file  . Intimate partner violence:    Fear of current or ex partner: Not on file    Emotionally abused: Not on file    Physically abused: Not on file    Forced sexual activity: Not on file  Other Topics Concern  . Not on file  Social History Narrative   ** Merged History Encounter **       ** Data from: 11/23/15 Enc Dept: [facility]   Married 1 son one daughter, relocated to [place] after living in [place] for many years. 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 No caffeine.       **  Data from: 11/25/15 Enc Dept: [facility]   Lives at home with her husband and brother. Right-handed. No caffeine use.    Past Surgical History:  Procedure Laterality Date  . CATARACT EXTRACTION, BILATERAL Bilateral 2012  . CESAREAN SECTION     x 2  . DILATION AND CURETTAGE OF UTERUS    . NASAL SINUS SURGERY    . TUBAL LIGATION      Family History  Problem Relation Age of Onset  . Asthma Father   . Emphysema Father   . Alcohol abuse Father   . Diabetes Mellitus II Brother        x 2  . Hyperlipidemia Brother   . Hypertension Brother   . Pancreatic cancer Brother   . Heart disease Maternal Grandmother        hardening of the arteries  . Heart murmur Daughter   . Prostate cancer Brother   . Glaucoma Brother   . Breast cancer Neg Hx     Allergies  Allergen Reactions  . Peanut-Containing Drug Products Anaphylaxis  . Actos [Pioglitazone] Nausea Only  . Milk-Related Compounds Nausea And Vomiting    Milk  and cheese  . Soy Allergy Nausea Only    Pt states had allergy testing and was told allergic to soy-causes nausea but no other reactions  . Vicodin [Hydrocodone-Acetaminophen] Nausea Only    nausea    Current Outpatient Medications on File Prior to Visit  Medication Sig Dispense Refill  . ACCU-CHEK FASTCLIX LANCETS MISC USE TO TEST BLOOD GLUCOSE 3-4 TIMES DAILY 120 each 5  . alendronate (FOSAMAX) 70 MG tablet TAKE 1 TABLET BY MOUTH 1  TIME A WEEK. TAKE WITH A  FULL GLASS OF WATER ON AN  EMPTY STOMACH. 12 tablet 0  . Ascorbic Acid (VITAMIN C) 1000 MG tablet Take 1,000 mg by mouth as needed.    Marland Kitchen azelastine (ASTELIN) 0.1 % nasal spray Place 2 sprays into both nostrils 2 (two) times daily. Use in each nostril as directed 30 mL 5  . Azelastine HCl 0.15 % SOLN Place 2 sprays into both nostrils 2 (two) times daily. 90 mL 0  . Azelastine-Fluticasone 137-50 MCG/ACT SUSP Place 2 sprays into the nose 2 (two) times daily. 23 g 5  . baclofen (LIORESAL) 10 MG tablet TAKE 1 TABLET BY MOUTH TWO  TIMES DAILY 180 tablet 0  . Barberry-Oreg Grape-Goldenseal (BERBERINE COMPLEX PO) Take by mouth daily.    . Blood Glucose Monitoring Suppl (ACCU-CHEK AVIVA PLUS) w/Device KIT     . cholecalciferol (VITAMIN D) 1000 units tablet Take 1,000 Units by mouth daily.    . ciclopirox (PENLAC) 8 % solution Apply topically at bedtime. Apply over nail and surrounding skin. Apply daily over previous coat. After seven (7) days, may remove with alcohol and continue cycle. 6.6 mL 2  . CINNAMON PO Take 1 tablet by mouth as needed.    . Cranberry 450 MG CAPS Take 1 capsule by mouth as needed.    . Cyanocobalamin (VITAMIN B-12 CR) 1500 MCG TBCR Take 1 tablet by mouth as needed.    . cycloSPORINE (RESTASIS) 0.05 % ophthalmic emulsion 1 drop 2 (two) times daily.    . Echinacea-Goldenseal (ECHINACEA COMB/GOLDEN SEAL PO) Take 1 tablet by mouth as needed. Reported on 12/16/2015    . fluticasone (FLONASE) 50 MCG/ACT nasal spray Place 1  spray into both nostrils 2 (two) times daily. 48 g 1  . fluticasone (FLONASE) 50 MCG/ACT nasal spray Place 2 sprays into  both nostrils 2 (two) times daily. 18.2 g 5  . gabapentin (NEURONTIN) 300 MG capsule TAKE 1 CAPSULE BY MOUTH 4  TIMES DAILY 360 capsule 0  . glucose blood (ACCU-CHEK GUIDE) test strip USE TO TEST BLOOD GLUCOSE THREE TIMES DAILY 300 each 3  . Green Tea, Camillia sinensis, (GREEN TEA PO) Take by mouth as needed. Reported on 12/16/2015    . Insulin Glargine (LANTUS SOLOSTAR) 100 UNIT/ML Solostar Pen Inject 14 Units into the skin daily at 10 pm. Inject 18-45 units at bedtime. 15 mL 3  . Insulin Pen Needle (BD PEN NEEDLE NANO U/F) 32G X 4 MM MISC USE TO TEST BLOOD GLUCOSE 4 TIMES DAILY 100 each 6  . Lactobacillus (ACIDOPHILUS PROBIOTIC PO) Take 1 tablet by mouth daily.    Marland Kitchen levocetirizine (XYZAL) 5 MG tablet Take 1 tablet (5 mg total) by mouth every evening. 90 tablet 1  . Melatonin 5 MG TABS Take 1 tablet by mouth as needed. Reported on 12/16/2015    . meloxicam (MOBIC) 15 MG tablet Take 1 tablet (15 mg total) by mouth daily. 90 tablet 0  . metFORMIN (GLUCOPHAGE) 500 MG tablet TAKE 2 TABLETS BY MOUTH 2  TIMES DAILY WITH A MEAL. 360 tablet 0  . montelukast (SINGULAIR) 10 MG tablet TAKE 1 TABLET BY MOUTH AT  BEDTIME 30 tablet 0  . Omega-3 Fatty Acids (OMEGA 3 PO) Take 1 tablet by mouth as needed.    Marland Kitchen omeprazole (PRILOSEC) 40 MG capsule Take 40 mg by mouth as needed.     . ramipril (ALTACE) 2.5 MG capsule Take 2.5 mg by mouth daily.    . simvastatin (ZOCOR) 20 MG tablet Take 1 tablet (20 mg total) by mouth daily. 90 tablet 0  . Spacer/Aero-Holding Chambers (AEROCHAMBER PLUS) inhaler Use as instructed with MDI 1 each 2  . Stinging Nettle 2 % POWD Take 1 tablet by mouth as needed. Reported on 12/16/2015    . TURMERIC PO Take 800 mg by mouth as needed. Reported on 12/16/2015     No current facility-administered medications on file prior to visit.     BP (!) 110/54   Temp 98.5 F  (36.9 C) (Oral)   Wt 138 lb (62.6 kg)   BMI 23.69 kg/m       Objective:   Physical Exam  Constitutional: She is oriented to person, place, and time. She appears well-developed and well-nourished. No distress.  Cardiovascular: Normal rate, regular rhythm, normal heart sounds and intact distal pulses. Exam reveals no gallop and no friction rub.  No murmur heard. Pulmonary/Chest: Effort normal and breath sounds normal. No stridor. No respiratory distress.  Neurological: She is alert and oriented to person, place, and time.  Skin: Skin is warm and dry. Capillary refill takes less than 2 seconds. She is not diaphoretic.  Psychiatric: She has a normal mood and affect. Her behavior is normal. Judgment and thought content normal.  Nursing note and vitals reviewed.     Assessment & Plan:  1. Other insomnia - We talked at length about good sleep hygiene.  She will work on cutting out TV/Phone/Tablet time one hour before bed - keep room cold and dark  - Get out of bed if not able to fall asleep within 30 minutes of laying down  - I am ok with doing a trial Trazodone 25 mg QHS PRN   [name], NP

## 2018-06-06 ENCOUNTER — Other Ambulatory Visit: Payer: Self-pay | Admitting: Allergy and Immunology

## 2018-06-06 NOTE — Telephone Encounter (Signed)
Gave courtesy refill of levocetirizine, pt needs ov.

## 2018-06-12 ENCOUNTER — Ambulatory Visit (INDEPENDENT_AMBULATORY_CARE_PROVIDER_SITE_OTHER): Payer: Medicare Other | Admitting: *Deleted

## 2018-06-12 DIAGNOSIS — J309 Allergic rhinitis, unspecified: Secondary | ICD-10-CM | POA: Diagnosis not present

## 2018-06-13 ENCOUNTER — Other Ambulatory Visit: Payer: Self-pay | Admitting: Family Medicine

## 2018-06-13 NOTE — Telephone Encounter (Signed)
Cory, I do not see that you have prescribed this medication in the past.  Please advise.

## 2018-06-14 NOTE — Telephone Encounter (Signed)
Please find out from patient who was prescribing it for her

## 2018-06-19 NOTE — Telephone Encounter (Signed)
Patient states Dr. Nancy Fetter at [facility] prescribed this medication last for her. She states now she is with [name] she would need him to fill the medication.

## 2018-06-19 NOTE — Telephone Encounter (Signed)
Left a message for a return call.  CRM created. 

## 2018-06-20 MED ORDER — RAMIPRIL 2.5 MG PO CAPS: 2.5000 mg | ORAL_CAPSULE | Freq: Every day | ORAL | 3 refills | Status: DC

## 2018-06-26 ENCOUNTER — Ambulatory Visit (INDEPENDENT_AMBULATORY_CARE_PROVIDER_SITE_OTHER): Payer: Medicare Other | Admitting: *Deleted

## 2018-06-26 DIAGNOSIS — J309 Allergic rhinitis, unspecified: Secondary | ICD-10-CM | POA: Diagnosis not present

## 2018-06-29 ENCOUNTER — Encounter: Payer: Self-pay | Admitting: *Deleted

## 2018-06-29 DIAGNOSIS — J301 Allergic rhinitis due to pollen: Secondary | ICD-10-CM | POA: Diagnosis not present

## 2018-06-29 NOTE — Progress Notes (Signed)
Maintenance vial made. Exp: 06-30-19. hv

## 2018-07-02 DIAGNOSIS — J302 Other seasonal allergic rhinitis: Secondary | ICD-10-CM | POA: Diagnosis not present

## 2018-07-03 ENCOUNTER — Encounter: Payer: Self-pay | Admitting: Adult Health

## 2018-07-03 ENCOUNTER — Ambulatory Visit (INDEPENDENT_AMBULATORY_CARE_PROVIDER_SITE_OTHER): Payer: Medicare Other | Admitting: Adult Health

## 2018-07-03 VITALS — BP 134/56 | Temp 98.4°F | Wt 137.0 lb

## 2018-07-03 DIAGNOSIS — E119 Type 2 diabetes mellitus without complications: Secondary | ICD-10-CM | POA: Diagnosis not present

## 2018-07-03 DIAGNOSIS — G4700 Insomnia, unspecified: Secondary | ICD-10-CM

## 2018-07-03 DIAGNOSIS — J3089 Other allergic rhinitis: Secondary | ICD-10-CM | POA: Diagnosis not present

## 2018-07-03 LAB — POCT GLYCOSYLATED HEMOGLOBIN (HGB A1C): HbA1c, POC (controlled diabetic range): 6.4 % (ref 0.0–7.0)

## 2018-07-03 NOTE — Progress Notes (Signed)
Subjective:    Patient ID: Kimberly Lam, female    DOB: [birth year], 72 y.o.   MRN: [id number]  HPI  72 year old female who  has a past medical history of Acid reflux, Allergic rhinitis, Anemia, Asthma, Bilateral sciatica, Chronic bilateral low back pain, Colon polyps, Diverticulosis, DM type 2 (diabetes mellitus, type 2) ([place]), Environmental allergies, GERD (gastroesophageal reflux disease), Hyperlipidemia, Low back pain, and Migraine.  She presents to the office today for follow up regarding diabetes and insomnia.   DM - currently prescribed Lantus 14 units QHS and Metformin 500 mg XR BID.  She continues to work on life style modifications.  Lab Results  Component Value Date   HGBA1C 6.7 03/14/2018   Insomnia - chronic issue.Has difficulty falling asleep and staying asleep.   During her last visit we discussed good sleep hygiene and she was started on Trazodone. She reports that she is getting more improved sleep. She has started turning off phone and tablet when in bed and is readings. She is taking trazodone as needed, she feels as though it makes her get more restful sleep but she will wake up feeling hung over.     Review of Systems See HPI   Past Medical History:  Diagnosis Date  . Acid reflux   . Allergic rhinitis   . Anemia   . Asthma   . Bilateral sciatica   . Chronic bilateral low back pain   . Colon polyps   . Diverticulosis   . DM type 2 (diabetes mellitus, type 2) ([place])   . Environmental allergies    flowers, pollen, trees  . GERD (gastroesophageal reflux disease)   . Hyperlipidemia   . Low back pain   . Migraine     Social History   Socioeconomic History  . Marital status: Married    Spouse name: Not on file  . Number of children: 2  . Years of education: 40  . Highest education level: Not on file  Occupational History  . Occupation: Retired  Scientific laboratory technician  . Financial resource strain: Not on file  . Food insecurity:    Worry: Not on file      Inability: Not on file  . Transportation needs:    Medical: Not on file    Non-medical: Not on file  Tobacco Use  . Smoking status: Never Smoker  . Smokeless tobacco: Never Used  Substance and Sexual Activity  . Alcohol use: No    Alcohol/week: 0.0 oz  . Drug use: No  . Sexual activity: Not on file  Lifestyle  . Physical activity:    Days per week: Not on file    Minutes per session: Not on file  . Stress: Not on file  Relationships  . Social connections:    Talks on phone: Not on file    Gets together: Not on file    Attends religious service: Not on file    Active member of club or organization: Not on file    Attends meetings of clubs or organizations: Not on file    Relationship status: Not on file  . Intimate partner violence:    Fear of current or ex partner: Not on file    Emotionally abused: Not on file    Physically abused: Not on file    Forced sexual activity: Not on file  Other Topics Concern  . Not on file  Social History Narrative   ** Merged History Encounter **       **  Data from: 11/23/15 Enc Dept: [facility]   Married 1 son one daughter, relocated to [place] after living in [place] for many years. 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       ** Data from: 11/25/15 Enc Dept: [facility]   Lives at home with her husband and brother. Right-handed. No caffeine use.    Past Surgical History:  Procedure Laterality Date  . CATARACT EXTRACTION, BILATERAL Bilateral 2012  . CESAREAN SECTION     x 2  . DILATION AND CURETTAGE OF UTERUS    . NASAL SINUS SURGERY    . TUBAL LIGATION      Family History  Problem Relation Age of Onset  . Asthma Father   . Emphysema Father   . Alcohol abuse Father   . Diabetes Mellitus II Brother        x 2  . Hyperlipidemia Brother   . Hypertension Brother   . Pancreatic cancer Brother   . Heart disease Maternal Grandmother        hardening of the arteries  . Heart  murmur Daughter   . Prostate cancer Brother   . Glaucoma Brother   . Breast cancer Neg Hx     Allergies  Allergen Reactions  . Peanut-Containing Drug Products Anaphylaxis  . Actos [Pioglitazone] Nausea Only  . Milk-Related Compounds Nausea And Vomiting    Milk and cheese  . Soy Allergy Nausea Only    Pt states had allergy testing and was told allergic to soy-causes nausea but no other reactions  . Vicodin [Hydrocodone-Acetaminophen] Nausea Only    nausea    Current Outpatient Medications on File Prior to Visit  Medication Sig Dispense Refill  . ACCU-CHEK FASTCLIX LANCETS MISC USE TO TEST BLOOD GLUCOSE 3-4 TIMES DAILY 120 each 5  . alendronate (FOSAMAX) 70 MG tablet TAKE 1 TABLET BY MOUTH 1  TIME A WEEK. TAKE WITH A  FULL GLASS OF WATER ON AN  EMPTY STOMACH. 12 tablet 0  . Ascorbic Acid (VITAMIN C) 1000 MG tablet Take 1,000 mg by mouth as needed.    Marland Kitchen azelastine (ASTELIN) 0.1 % nasal spray Place 2 sprays into both nostrils 2 (two) times daily. Use in each nostril as directed 30 mL 5  . Azelastine HCl 0.15 % SOLN Place 2 sprays into both nostrils 2 (two) times daily. 90 mL 0  . Azelastine-Fluticasone 137-50 MCG/ACT SUSP Place 2 sprays into the nose 2 (two) times daily. 23 g 5  . baclofen (LIORESAL) 10 MG tablet TAKE 1 TABLET BY MOUTH TWO  TIMES DAILY 180 tablet 0  . Barberry-Oreg Grape-Goldenseal (BERBERINE COMPLEX PO) Take by mouth daily.    . Blood Glucose Monitoring Suppl (ACCU-CHEK AVIVA PLUS) w/Device KIT     . cholecalciferol (VITAMIN D) 1000 units tablet Take 1,000 Units by mouth daily.    . ciclopirox (PENLAC) 8 % solution Apply topically at bedtime. Apply over nail and surrounding skin. Apply daily over previous coat. After seven (7) days, may remove with alcohol and continue cycle. 6.6 mL 2  . CINNAMON PO Take 1 tablet by mouth as needed.    . Cranberry 450 MG CAPS Take 1 capsule by mouth as needed.    . Cyanocobalamin (VITAMIN B-12 CR) 1500 MCG TBCR Take 1 tablet by mouth as  needed.    . cycloSPORINE (RESTASIS) 0.05 % ophthalmic emulsion 1 drop 2 (two) times daily.    Marland Kitchen Echinacea-Goldenseal (ECHINACEA COMB/GOLDEN SEAL PO)  Take 1 tablet by mouth as needed. Reported on 12/16/2015    . fluticasone (FLONASE) 50 MCG/ACT nasal spray Place 1 spray into both nostrils 2 (two) times daily. 48 g 1  . fluticasone (FLONASE) 50 MCG/ACT nasal spray Place 2 sprays into both nostrils 2 (two) times daily. 18.2 g 5  . gabapentin (NEURONTIN) 300 MG capsule TAKE 1 CAPSULE BY MOUTH 4  TIMES DAILY 360 capsule 0  . glucose blood (ACCU-CHEK GUIDE) test strip USE TO TEST BLOOD GLUCOSE THREE TIMES DAILY 300 each 3  . Green Tea, Camillia sinensis, (GREEN TEA PO) Take by mouth as needed. Reported on 12/16/2015    . Insulin Glargine (LANTUS SOLOSTAR) 100 UNIT/ML Solostar Pen Inject 14 Units into the skin daily at 10 pm. Inject 18-45 units at bedtime. 15 mL 3  . Insulin Pen Needle (BD PEN NEEDLE NANO U/F) 32G X 4 MM MISC USE TO TEST BLOOD GLUCOSE 4 TIMES DAILY 100 each 6  . Lactobacillus (ACIDOPHILUS PROBIOTIC PO) Take 1 tablet by mouth daily.    Marland Kitchen levocetirizine (XYZAL) 5 MG tablet TAKE 1 TABLET BY MOUTH EVERY DAY IN THE EVENING 30 tablet 0  . Melatonin 5 MG TABS Take 1 tablet by mouth as needed. Reported on 12/16/2015    . meloxicam (MOBIC) 15 MG tablet Take 1 tablet (15 mg total) by mouth daily. 90 tablet 0  . metFORMIN (GLUCOPHAGE-XR) 500 MG 24 hr tablet Take 500 mg by mouth 2 (two) times daily.    . montelukast (SINGULAIR) 10 MG tablet TAKE 1 TABLET BY MOUTH AT  BEDTIME 30 tablet 0  . Omega-3 Fatty Acids (OMEGA 3 PO) Take 1 tablet by mouth as needed.    Marland Kitchen omeprazole (PRILOSEC) 40 MG capsule Take 40 mg by mouth as needed.     . ramipril (ALTACE) 2.5 MG capsule Take 1 capsule (2.5 mg total) by mouth daily. 90 capsule 3  . simvastatin (ZOCOR) 20 MG tablet Take 1 tablet (20 mg total) by mouth daily. 90 tablet 0  . Spacer/Aero-Holding Chambers (AEROCHAMBER PLUS) inhaler Use as instructed with MDI  1 each 2  . Stinging Nettle 2 % POWD Take 1 tablet by mouth as needed. Reported on 12/16/2015    . traZODone (DESYREL) 50 MG tablet Take 0.5-1 tablets (25-50 mg total) by mouth at bedtime as needed for sleep. 30 tablet 3  . TURMERIC PO Take 800 mg by mouth as needed. Reported on 12/16/2015     No current facility-administered medications on file prior to visit.     BP (!) 134/56   Temp 98.4 F (36.9 C)   Wt 137 lb (62.1 kg)   BMI 23.52 kg/m       Objective:   Physical Exam  Constitutional: She is oriented to person, place, and time. She appears well-developed and well-nourished. No distress.  Cardiovascular: Normal rate, regular rhythm, normal heart sounds and intact distal pulses.  Pulmonary/Chest: Effort normal and breath sounds normal.  Neurological: She is alert and oriented to person, place, and time.  Skin: Skin is warm and dry. She is not diaphoretic.  Psychiatric: She has a normal mood and affect. Her behavior is normal. Judgment and thought content normal.  Nursing note and vitals reviewed.     Assessment & Plan:  1. Diabetes mellitus without complication ([place]) - POCT A1C- 6.4  - Has improved.  - No change in medications   2. Insomnia, unspecified type - Improved.  - Continue with current plan - Follow up as  needed  [name], NP

## 2018-07-11 ENCOUNTER — Ambulatory Visit (INDEPENDENT_AMBULATORY_CARE_PROVIDER_SITE_OTHER): Payer: Medicare Other | Admitting: *Deleted

## 2018-07-11 DIAGNOSIS — J309 Allergic rhinitis, unspecified: Secondary | ICD-10-CM | POA: Diagnosis not present

## 2018-07-18 ENCOUNTER — Other Ambulatory Visit: Payer: Self-pay | Admitting: Neurology

## 2018-07-18 ENCOUNTER — Other Ambulatory Visit: Payer: Self-pay | Admitting: Adult Health

## 2018-07-19 ENCOUNTER — Telehealth: Payer: Self-pay

## 2018-07-19 NOTE — Telephone Encounter (Signed)
Received fax for refill for Xyzal. Patient was last seen 11/2017 and was to return in 6 months. A courtesy refill was sent in 05/2018 patient will need OV for further refills.

## 2018-07-24 ENCOUNTER — Ambulatory Visit (INDEPENDENT_AMBULATORY_CARE_PROVIDER_SITE_OTHER): Payer: Medicare Other | Admitting: *Deleted

## 2018-07-24 DIAGNOSIS — J309 Allergic rhinitis, unspecified: Secondary | ICD-10-CM | POA: Diagnosis not present

## 2018-07-25 ENCOUNTER — Other Ambulatory Visit: Payer: Self-pay | Admitting: Family Medicine

## 2018-07-25 MED ORDER — METFORMIN HCL ER 500 MG PO TB24: 500.0000 mg | ORAL_TABLET | Freq: Two times a day (BID) | ORAL | 0 refills | Status: DC

## 2018-07-25 MED ORDER — ACCU-CHEK FASTCLIX LANCETS MISC: 3 refills | Status: DC

## 2018-07-25 NOTE — Addendum Note (Signed)
Addended by: Miles Costain T on: 07/25/2018 02:32 PM   Modules accepted: Orders

## 2018-07-25 NOTE — Telephone Encounter (Signed)
Sent to the pharmacy by e-scribe. 

## 2018-07-31 ENCOUNTER — Ambulatory Visit: Payer: Self-pay | Admitting: Neurology

## 2018-07-31 ENCOUNTER — Telehealth: Payer: Self-pay | Admitting: *Deleted

## 2018-07-31 ENCOUNTER — Encounter: Payer: Self-pay | Admitting: Neurology

## 2018-07-31 ENCOUNTER — Telehealth: Payer: Self-pay | Admitting: Adult Health

## 2018-07-31 ENCOUNTER — Ambulatory Visit (INDEPENDENT_AMBULATORY_CARE_PROVIDER_SITE_OTHER): Payer: Medicare Other

## 2018-07-31 DIAGNOSIS — J309 Allergic rhinitis, unspecified: Secondary | ICD-10-CM | POA: Diagnosis not present

## 2018-07-31 MED ORDER — METFORMIN HCL ER 500 MG PO TB24: 500.0000 mg | ORAL_TABLET | Freq: Two times a day (BID) | ORAL | 0 refills | Status: DC

## 2018-07-31 NOTE — Telephone Encounter (Signed)
Copied from [place] [id number]. Topic: General - Other >> Jul 31, 2018  9:31 AM Judyann Munson wrote: Reason for CRM:  [facility]- is calling to confirm the correct dosage for METFORMIN (GLUCOPHAGE-XR) 500 MG 24 hr tablet.    Ref # [id number] Cb# [phone]   Please advise

## 2018-07-31 NOTE — Telephone Encounter (Signed)
Canceled follow up same day - unable to afford co-pay

## 2018-07-31 NOTE — Telephone Encounter (Signed)
Looked at prescription that was sent to [facility].  It was sent for #90 instead of #180.  Rx changed and sent to the pharmacy.  Nothing further needed.

## 2018-08-08 ENCOUNTER — Ambulatory Visit (INDEPENDENT_AMBULATORY_CARE_PROVIDER_SITE_OTHER): Payer: Medicare Other | Admitting: *Deleted

## 2018-08-08 ENCOUNTER — Telehealth: Payer: Self-pay

## 2018-08-08 ENCOUNTER — Other Ambulatory Visit: Payer: Self-pay | Admitting: Allergy and Immunology

## 2018-08-08 DIAGNOSIS — J309 Allergic rhinitis, unspecified: Secondary | ICD-10-CM

## 2018-08-08 MED ORDER — MONTELUKAST SODIUM 10 MG PO TABS: 10.0000 mg | ORAL_TABLET | Freq: Every day | ORAL | 0 refills | Status: DC

## 2018-08-08 NOTE — Telephone Encounter (Addendum)
05/29/18 script was for metformin 500 mg 2 tab BID. Office notes from 07/03/18 say metformin XR 500 mg 1 tab BID, but I don't see any documentation of when/why patient changed from regular to XR. Next scripts on 07/25/18 and 07/31/18 was for metformin XR 500 mg 1 tab BID.  Do you want her to continue the XR 500 mg BID?

## 2018-08-08 NOTE — Telephone Encounter (Signed)
Yes continue with XR. It was due to GI issues

## 2018-08-08 NOTE — Telephone Encounter (Signed)
Patient requesting refills on montelukast. Patient has already had courtesy fills so she must be seen in office prior to more refills being sent in. Patient acknowledged understanding of this information and is being scheduled now.

## 2018-08-08 NOTE — Telephone Encounter (Signed)
Katelyn w/[facility] [phone] Ref #: [id number] RX stated the patient's metFORMIN (GLUCOPHAGE-XR) 500 MG 24 hr tablet medication was originally written for regular release but this new prescription is written for extended release.  They would like to know if this was intentionally.

## 2018-08-09 NOTE — Telephone Encounter (Signed)
John w/ [facility] notified. Nothing further needed.

## 2018-08-16 ENCOUNTER — Ambulatory Visit (INDEPENDENT_AMBULATORY_CARE_PROVIDER_SITE_OTHER): Payer: Medicare Other | Admitting: *Deleted

## 2018-08-16 DIAGNOSIS — J309 Allergic rhinitis, unspecified: Secondary | ICD-10-CM

## 2018-08-17 ENCOUNTER — Telehealth: Payer: Self-pay | Admitting: Adult Health

## 2018-08-17 MED ORDER — METFORMIN HCL ER 500 MG PO TB24: 500.0000 mg | ORAL_TABLET | Freq: Two times a day (BID) | ORAL | 0 refills | Status: DC

## 2018-08-17 NOTE — Telephone Encounter (Signed)
Copied from [place] [id number]. Topic: Quick Communication - Rx Refill/Question >> Aug 17, 2018 11:27 AM Oliver Pila B wrote: Pt called and states that she is completely out or will be by the time the medication gets to her home; pt is needing a partial refill sent to her pharmacy as well @ [facility]; contact pt to advise  Medication: metFORMIN (GLUCOPHAGE-XR) 500 MG 24 hr tablet [[id number]]   Has the patient contacted their pharmacy? Yes.   (Agent: If no, request that the patient contact the pharmacy for the refill.) (Agent: If yes, when and what did the pharmacy advise?)  Preferred Pharmacy (with phone number or street name): [facility]  Agent: Please be advised that RX refills may take up to 3 business days. We ask that you follow-up with your pharmacy.

## 2018-08-17 NOTE — Telephone Encounter (Signed)
Sent to the pharmacy by e-scribe. 

## 2018-08-22 ENCOUNTER — Ambulatory Visit: Payer: Medicare Other | Admitting: Family Medicine

## 2018-08-24 ENCOUNTER — Ambulatory Visit: Payer: Medicare Other | Admitting: Family Medicine

## 2018-08-24 VITALS — BP 108/48 | HR 73 | Temp 98.5°F | Resp 20 | Ht 64.8 in | Wt 140.6 lb

## 2018-08-24 DIAGNOSIS — J3089 Other allergic rhinitis: Secondary | ICD-10-CM | POA: Diagnosis not present

## 2018-08-24 DIAGNOSIS — J4540 Moderate persistent asthma, uncomplicated: Secondary | ICD-10-CM | POA: Diagnosis not present

## 2018-08-24 DIAGNOSIS — T7800XD Anaphylactic reaction due to unspecified food, subsequent encounter: Secondary | ICD-10-CM

## 2018-08-24 MED ORDER — MONTELUKAST SODIUM 10 MG PO TABS: 10.0000 mg | ORAL_TABLET | Freq: Every day | ORAL | 0 refills | Status: DC

## 2018-08-24 MED ORDER — AZELASTINE HCL 0.1 % NA SOLN: 2.0000 | Freq: Two times a day (BID) | NASAL | 5 refills | Status: DC

## 2018-08-24 NOTE — Patient Instructions (Addendum)
  1. Perennial and seasonal allergic rhinoconjunctivitis - Continue over the counter antihistamine once a day as needed for a runny nose - Continue montelukast 10 mg daily - Continue allergy shots every 3 weeks (red vial) - Continue fluticasone nasal spray 1 spray in each nostril 2 times a day  - Azelastine nasal spray 1-2 sprays in each nostril twice a day as needed - Or you may use Dymista instead of fluticasone and Astelin (sample provided in the clinic) - Begin nasal saline washes daily/ nasal saline gel - If your nasal symptoms are not well controlled, call the clinic so we can adjust the medications or move to a different delivery system   2. Moderate persistent asthma, uncomplicated - Begin Dulera 100-  2 puffs twice a day with a spacer to prevent cough or wheeze (Dulera and spacer provided form the clinic) - If mouth sores develop while using Dulera, call the clinic so that we can adjust this or change to a different medication or delivery system. - Use ProAir 2 puffs every 4 hours as needed for cough or wheeze. Use ProAir 2 puffs 5-15 minutes before exercise to prevent cough or wheeze  3. Anaphylactic shock due to food, subsequent encounter - Continue to avoid peanuts, cow's milk, soybean - Have access to a current EpiPen - Food allergy action plan is in place  4. Follow up in 6 months or sooner as needed

## 2018-08-24 NOTE — Progress Notes (Signed)
[place] [address] Dept: [phone]  FOLLOW UP NOTE  Patient ID: Kimberly Lam, female    DOB: March 30, 1946  Age: 72 y.o. MRN: [id number] Date of Office Visit: 08/24/2018  Assessment  Chief Complaint: Follow-up  HPI Kimberly Lam is a 72 year old female who presents to the clinic for a follow up visit. She was last seen in this clinic on 12/01/2107 by Gareth Morgan, NP for evaluation of asthma, allergic rhinitis, and food allergy. At that time, her asthma was well controlled with Spiriva Respimat, montelukast 10 mg, and as needed albuterol via inhaler. She continues allergen immunotherapy, Flonase nasal spray and cetirizine. She continued to avoid peanut cow's milk, and soybean.  At today's visit, she reports her main problem consists of nasal congestion which in turn occasionally aggravates her asthma. In terms of her nasal issues, she reports constant nasal congestion with the left nare slightly worse than the right. She reports difficulty getting any air to pass through her nose which aggravates her asthma. She reports frequent throat clearing and cough with clear mucus for which she is currently taking Mucinex with moderate success. She is using Flonase once a day and, when she can afford it, azelastine nasal spray. She reports this combination as helpful in decreasing her nasal symptoms. She does not currently have azelastine nasal spray.   Asthma is reported as moderately well controlled. She reports some shortness of breath with activity which she believes is mainly from her nasal issues. She denies shortness of breath and wheezing at rest. She has used several different inhalers in the past, including Qvar, Flovent, Arunity, and Spiriva, and reports that after a few weeks they all cause her to develop sores on her gums which resolve when she stops using the inhalers. She reports she does feel that her breathing is better when she is using the inhaler. Due to  financial burden, she has not been using any inhalers for the last 4 months.   She continues to avoid cow's milk, peanuts, and soybeans. She has not had any accidental ingestions nor has she needed to use her epinephrine device since her last visit to this office.   Her current medications are listed in the chart.  Drug Allergies:  Allergies  Allergen Reactions  . Peanut-Containing Drug Products Anaphylaxis  . Actos [Pioglitazone] Nausea Only  . Milk-Related Compounds Nausea And Vomiting    Milk and cheese  . Soy Allergy Nausea Only    Pt states had allergy testing and was told allergic to soy-causes nausea but no other reactions  . Vicodin [Hydrocodone-Acetaminophen] Nausea Only    nausea    Physical Exam: BP (!) 108/48   Pulse 73   Temp 98.5 F (36.9 C) (Oral)   Resp 20   Ht 5' 4.8" (1.646 m)   Wt 140 lb 9.6 oz (63.8 kg)   SpO2 97%   BMI 23.54 kg/m    Physical Exam  Constitutional: She is oriented to person, place, and time. She appears well-developed and well-nourished.  HENT:  Head: Normocephalic.  Right Ear: External ear normal.  Left Ear: External ear normal.  Bilateral nares erythematous and edematous with clear nasal drainage noted. Pharynx erythematous with no exudate noted. Ears normal. Eyes normal.   Eyes: Conjunctivae are normal.  Neck: Normal range of motion. Neck supple.  Cardiovascular: Normal rate, regular rhythm and normal heart sounds.  No murmur noted  Pulmonary/Chest: Effort normal and breath sounds normal.  Lungs clear  to auscultation  Musculoskeletal: Normal range of motion.  Neurological: She is alert and oriented to person, place, and time.  Skin: Skin is warm and dry.  Psychiatric: She has a normal mood and affect. Her behavior is normal. Judgment and thought content normal.  Vitals reviewed.   Diagnostics: FVC 1.93, FEV1 1.67. Predicted FVC 2.48, predicted FEV1 1.92. Spirometry indicates mild restriction. Patient refused bronchodilator  therapy in the clinic today.   Assessment and Plan: 1. Moderate persistent asthma without complication   2. Perennial and seasonal allergic rhinoconjunctivitis   3. Anaphylactic shock due to food, subsequent encounter     Meds ordered this encounter  Medications  . azelastine (ASTELIN) 0.1 % nasal spray    Sig: Place 2 sprays into both nostrils 2 (two) times daily. Use in each nostril as directed    Dispense:  30 mL    Refill:  5  . montelukast (SINGULAIR) 10 MG tablet    Sig: Take 1 tablet (10 mg total) by mouth at bedtime.    Dispense:  30 tablet    Refill:  0    Patient must keep scheduled office visit for further refills.    Patient Instructions   1. Perennial and seasonal allergic rhinoconjunctivitis - Continue over the counter antihistamine once a day as needed for a runny nose - Continue montelukast 10 mg daily - Continue allergy shots every 3 weeks (red vial) - Continue fluticasone nasal spray 1 spray in each nostril 2 times a day  - Azelastine nasal spray 1-2 sprays in each nostril twice a day as needed - Or you may use Dymista instead of fluticasone and Astelin (sample provided in the clinic) - Begin nasal saline washes daily/ nasal saline gel - If your nasal symptoms are not well controlled, call the clinic so we can adjust the medications or move to a different delivery system. Consider budesonide nasal rinses if combination Flonase/Astelin fails.    2. Moderate persistent asthma, uncomplicated - Begin Dulera 100-  2 puffs twice a day with a spacer to prevent cough or wheeze (Dulera and spacer provided form the clinic) - If mouth sores develop while using Dulera, call the clinic so that we can adjust this or change to a different medication or delivery system. Will check the cost and availability of budesonide 0.5 ml via nebulizer for future if Dulera fails. - Use ProAir 2 puffs every 4 hours as needed for cough or wheeze. Use ProAir 2 puffs 5-15 minutes before  exercise to prevent cough or wheeze  3. Anaphylactic shock due to food, subsequent encounter - Continue to avoid peanuts, cow's milk, soybean - Have access to a current EpiPen - Food allergy action plan is in place  4. Follow up in 6 months or sooner as needed   Return in about 6 months (around 02/22/2019).    Thank you for the opportunity to care for this patient.  Please do not hesitate to contact me with questions.  Gareth Morgan, FNP [facility]

## 2018-08-25 ENCOUNTER — Encounter: Payer: Self-pay | Admitting: Family Medicine

## 2018-08-28 NOTE — Addendum Note (Signed)
Addended by: Orpah Greek D on: 08/28/2018 08:32 AM   Modules accepted: Orders

## 2018-08-30 ENCOUNTER — Ambulatory Visit (INDEPENDENT_AMBULATORY_CARE_PROVIDER_SITE_OTHER): Payer: Medicare Other | Admitting: *Deleted

## 2018-08-30 DIAGNOSIS — J309 Allergic rhinitis, unspecified: Secondary | ICD-10-CM | POA: Diagnosis not present

## 2018-08-31 ENCOUNTER — Telehealth: Payer: Self-pay | Admitting: Neurology

## 2018-08-31 NOTE — Telephone Encounter (Signed)
Pt ha called re: her missed appointment in Aug, she states she thought it was r/s.  Pt has accepted 1st avail appointemnt and is on wait list.  Pt is asking if she can get a refill on her gabapentin (NEURONTIN) 300 MG capsule, if so please send to  [id number] - [place], [place] [address] AT [address] [phone] (Phone) [phone] (Fax)

## 2018-08-31 NOTE — Telephone Encounter (Signed)
90 day rx. was sent in on 07/18/18./fim

## 2018-09-12 ENCOUNTER — Ambulatory Visit (INDEPENDENT_AMBULATORY_CARE_PROVIDER_SITE_OTHER): Payer: Medicare Other

## 2018-09-12 ENCOUNTER — Other Ambulatory Visit: Payer: Self-pay | Admitting: *Deleted

## 2018-09-12 ENCOUNTER — Telehealth: Payer: Self-pay | Admitting: Family Medicine

## 2018-09-12 DIAGNOSIS — J309 Allergic rhinitis, unspecified: Secondary | ICD-10-CM | POA: Diagnosis not present

## 2018-09-12 MED ORDER — METFORMIN HCL ER 500 MG PO TB24: 500.0000 mg | ORAL_TABLET | Freq: Two times a day (BID) | ORAL | 1 refills | Status: DC

## 2018-09-12 NOTE — Telephone Encounter (Signed)
Samples for Dulera 100, Dymista, Zyrtec placed up front for pick up. Patient notified

## 2018-09-12 NOTE — Telephone Encounter (Signed)
Pt came up to window and wanted to get samples for Dymista, singulair, azelastine, zyrtec, spiriva. [phone].

## 2018-09-26 ENCOUNTER — Ambulatory Visit (INDEPENDENT_AMBULATORY_CARE_PROVIDER_SITE_OTHER): Payer: Medicare Other

## 2018-09-26 ENCOUNTER — Encounter: Payer: Self-pay | Admitting: Family Medicine

## 2018-09-26 DIAGNOSIS — J309 Allergic rhinitis, unspecified: Secondary | ICD-10-CM

## 2018-10-05 ENCOUNTER — Other Ambulatory Visit: Payer: Self-pay | Admitting: Adult Health

## 2018-10-05 DIAGNOSIS — Z1231 Encounter for screening mammogram for malignant neoplasm of breast: Secondary | ICD-10-CM

## 2018-10-08 ENCOUNTER — Ambulatory Visit (INDEPENDENT_AMBULATORY_CARE_PROVIDER_SITE_OTHER): Payer: Medicare Other

## 2018-10-08 DIAGNOSIS — J309 Allergic rhinitis, unspecified: Secondary | ICD-10-CM | POA: Diagnosis not present

## 2018-10-10 ENCOUNTER — Other Ambulatory Visit: Payer: Self-pay | Admitting: Allergy and Immunology

## 2018-10-11 ENCOUNTER — Other Ambulatory Visit: Payer: Self-pay | Admitting: Family Medicine

## 2018-10-12 ENCOUNTER — Other Ambulatory Visit: Payer: Self-pay | Admitting: Family Medicine

## 2018-10-12 ENCOUNTER — Other Ambulatory Visit: Payer: Self-pay | Admitting: Adult Health

## 2018-10-12 DIAGNOSIS — J0100 Acute maxillary sinusitis, unspecified: Secondary | ICD-10-CM

## 2018-10-12 MED ORDER — MONTELUKAST SODIUM 10 MG PO TABS: 10.0000 mg | ORAL_TABLET | Freq: Every day | ORAL | 2 refills | Status: DC

## 2018-10-12 MED ORDER — LEVOCETIRIZINE DIHYDROCHLORIDE 5 MG PO TABS: 5.0000 mg | ORAL_TABLET | Freq: Every evening | ORAL | 2 refills | Status: DC

## 2018-10-12 MED ORDER — AZELASTINE HCL 0.15 % NA SOLN: 2.0000 | Freq: Every day | NASAL | 2 refills | Status: DC

## 2018-10-12 NOTE — Telephone Encounter (Signed)
Informed patient that 2 Dulera 100 samples are ready for her to pick up at the [place] office. She stated she would pick them up today.

## 2018-10-12 NOTE — Telephone Encounter (Signed)
Patient requesting refills

## 2018-10-18 ENCOUNTER — Ambulatory Visit (INDEPENDENT_AMBULATORY_CARE_PROVIDER_SITE_OTHER): Payer: Medicare Other | Admitting: Adult Health

## 2018-10-18 ENCOUNTER — Encounter: Payer: Self-pay | Admitting: Adult Health

## 2018-10-18 VITALS — BP 120/60 | Temp 98.3°F | Ht 64.0 in | Wt 139.0 lb

## 2018-10-18 DIAGNOSIS — Z1159 Encounter for screening for other viral diseases: Secondary | ICD-10-CM | POA: Diagnosis not present

## 2018-10-18 DIAGNOSIS — E1169 Type 2 diabetes mellitus with other specified complication: Secondary | ICD-10-CM

## 2018-10-18 DIAGNOSIS — M810 Age-related osteoporosis without current pathological fracture: Secondary | ICD-10-CM | POA: Diagnosis not present

## 2018-10-18 DIAGNOSIS — E785 Hyperlipidemia, unspecified: Secondary | ICD-10-CM

## 2018-10-18 DIAGNOSIS — Z0000 Encounter for general adult medical examination without abnormal findings: Secondary | ICD-10-CM

## 2018-10-18 DIAGNOSIS — E119 Type 2 diabetes mellitus without complications: Secondary | ICD-10-CM | POA: Diagnosis not present

## 2018-10-18 LAB — CBC WITH DIFFERENTIAL/PLATELET
BASOS PCT: 1.2 % (ref 0.0–3.0)
Basophils Absolute: 0 10*3/uL (ref 0.0–0.1)
EOS PCT: 2.3 % (ref 0.0–5.0)
Eosinophils Absolute: 0.1 10*3/uL (ref 0.0–0.7)
HEMATOCRIT: 43.8 % (ref 36.0–46.0)
Hemoglobin: 14.7 g/dL (ref 12.0–15.0)
LYMPHS PCT: 32.2 % (ref 12.0–46.0)
Lymphs Abs: 0.9 10*3/uL (ref 0.7–4.0)
MCHC: 33.6 g/dL (ref 30.0–36.0)
MCV: 92.2 fl (ref 78.0–100.0)
MONOS PCT: 11.7 % (ref 3.0–12.0)
Monocytes Absolute: 0.3 10*3/uL (ref 0.1–1.0)
NEUTROS ABS: 1.5 10*3/uL (ref 1.4–7.7)
Neutrophils Relative %: 52.6 % (ref 43.0–77.0)
PLATELETS: 192 10*3/uL (ref 150.0–400.0)
RBC: 4.76 Mil/uL (ref 3.87–5.11)
RDW: 13.8 % (ref 11.5–15.5)
WBC: 2.9 10*3/uL — ABNORMAL LOW (ref 4.0–10.5)

## 2018-10-18 LAB — LIPID PANEL
CHOLESTEROL: 166 mg/dL (ref 0–200)
HDL: 68.2 mg/dL (ref >39.00)
LDL Cholesterol: 79 mg/dL (ref 0–99)
NonHDL: 97.54
TRIGLYCERIDES: 91 mg/dL (ref 0.0–149.0)
Total CHOL/HDL Ratio: 2
VLDL: 18.2 mg/dL (ref 0.0–40.0)

## 2018-10-18 LAB — HEPATIC FUNCTION PANEL
ALT: 14 U/L (ref 0–35)
AST: 14 U/L (ref 0–37)
Albumin: 4.7 g/dL (ref 3.5–5.2)
Alkaline Phosphatase: 36 U/L — ABNORMAL LOW (ref 39–117)
Bilirubin, Direct: 0.1 mg/dL (ref 0.0–0.3)
Total Bilirubin: 0.4 mg/dL (ref 0.2–1.2)
Total Protein: 7.2 g/dL (ref 6.0–8.3)

## 2018-10-18 LAB — BASIC METABOLIC PANEL
BUN: 14 mg/dL (ref 6–23)
CHLORIDE: 104 meq/L (ref 96–112)
CO2: 31 meq/L (ref 19–32)
CREATININE: 0.89 mg/dL (ref 0.40–1.20)
Calcium: 10.3 mg/dL (ref 8.4–10.5)
GFR: 80.38 mL/min (ref >60.00)
Glucose, Bld: 119 mg/dL — ABNORMAL HIGH (ref 70–99)
Potassium: 4.8 mEq/L (ref 3.5–5.1)
Sodium: 140 mEq/L (ref 135–145)

## 2018-10-18 LAB — HEMOGLOBIN A1C: Hgb A1c MFr Bld: 6.8 % — ABNORMAL HIGH (ref 4.6–6.5)

## 2018-10-18 LAB — VITAMIN D 25 HYDROXY (VIT D DEFICIENCY, FRACTURES): VITD: 63.42 ng/mL (ref 30.00–100.00)

## 2018-10-18 LAB — TSH: TSH: 1.53 u[IU]/mL (ref 0.35–4.50)

## 2018-10-18 NOTE — Progress Notes (Signed)
Subjective:    Patient ID: Kimberly Lam, female    DOB: [birth year], 72 y.o.   MRN: [id number]  HPI  Patient presents for yearly preventative medicine examination. She is a pleasant 72 year old female who  has a past medical history of Acid reflux, Allergic rhinitis, Anemia, Asthma, Bilateral sciatica, Chronic bilateral low back pain, Colon polyps, Diverticulosis, DM type 2 (diabetes mellitus, type 2) ([place]), Environmental allergies, GERD (gastroesophageal reflux disease), Hyperlipidemia, Low back pain, and Migraine.  DM -takes metformin 500 mg extended release twice daily and uses Lantus 14 QHS PRN. She takes Ramipril 2.5 mg for kidney protection. She denies any symptoms of hypo/hyperglycemia. Blood sugars are consistently below 120 Lab Results  Component Value Date   HGBA1C 6.4 07/03/2018   Hyperlipidemia - she takes Zocor 20 mg  Lab Results  Component Value Date   CHOL 168 06/13/2017   HDL 63.30 06/13/2017   LDLCALC 87 06/13/2017   TRIG 89.0 06/13/2017   CHOLHDL 3 06/13/2017    Osteoporosis - she takes Fosamax weekly.   All immunizations and health maintenance protocols were reviewed with the patient and needed orders were placed. She is due for seasonal flu and pneumonia vaccination. Refuses pneumonia and flu vaccinations.   Appropriate screening laboratory values were ordered for the patient including screening of hyperlipidemia, renal function and hepatic function.  Medication reconciliation,  past medical history, social history, problem list and allergies were reviewed in detail with the patient  Goals were established with regard to weight loss, exercise, and  diet in compliance with medications. She stays active and has been working on her diet  Wt Readings from Last 3 Encounters:  10/18/18 139 lb (63 kg)  08/24/18 140 lb 9.6 oz (63.8 kg)  07/03/18 137 lb (62.1 kg)   End of life planning was discussed.  She is due for a colonoscopy but refuses to have it done  at this time ( due to financial issues, she plans to have it in the future). She has an upcoming mammogram   Review of Systems  Constitutional: Negative.   HENT: Negative.   Eyes: Negative.   Respiratory: Negative.   Cardiovascular: Negative.   Gastrointestinal: Negative.   Endocrine: Negative.   Genitourinary: Negative.   Musculoskeletal: Positive for arthralgias and myalgias.  Skin: Negative.   Allergic/Immunologic: Negative.   Neurological: Negative.   Hematological: Negative.   Psychiatric/Behavioral: Negative.    Past Medical History:  Diagnosis Date  . Acid reflux   . Allergic rhinitis   . Anemia   . Asthma   . Bilateral sciatica   . Chronic bilateral low back pain   . Colon polyps   . Diverticulosis   . DM type 2 (diabetes mellitus, type 2) ([place])   . Environmental allergies    flowers, pollen, trees  . GERD (gastroesophageal reflux disease)   . Hyperlipidemia   . Low back pain   . Migraine     Social History   Socioeconomic History  . Marital status: Married    Spouse name: Not on file  . Number of children: 2  . Years of education: 63  . Highest education level: Not on file  Occupational History  . Occupation: Retired  Scientific laboratory technician  . Financial resource strain: Not on file  . Food insecurity:    Worry: Not on file    Inability: Not on file  . Transportation needs:    Medical: Not on file    Non-medical: Not on file  Tobacco Use  . Smoking status: Never Smoker  . Smokeless tobacco: Never Used  Substance and Sexual Activity  . Alcohol use: No    Alcohol/week: 0.0 standard drinks  . Drug use: No  . Sexual activity: Not on file  Lifestyle  . Physical activity:    Days per week: Not on file    Minutes per session: Not on file  . Stress: Not on file  Relationships  . Social connections:    Talks on phone: Not on file    Gets together: Not on file    Attends religious service: Not on file    Active member of club or organization: Not on file      Attends meetings of clubs or organizations: Not on file    Relationship status: Not on file  . Intimate partner violence:    Fear of current or ex partner: Not on file    Emotionally abused: Not on file    Physically abused: Not on file    Forced sexual activity: Not on file  Other Topics Concern  . Not on file  Social History Narrative   ** Merged History Encounter **       ** Data from: 11/23/15 Enc Dept: [facility]   Married 1 son one daughter, relocated to [place] after living in [place] for many years. She is a Writer of [employer]. No caffeine.       ** Data from: 11/25/15 Enc Dept: [facility]   Lives at home with her husband and brother. Right-handed. No caffeine use.    Past Surgical History:  Procedure Laterality Date  . CATARACT EXTRACTION, BILATERAL Bilateral 2012  . CESAREAN SECTION     x 2  . DILATION AND CURETTAGE OF UTERUS    . NASAL SINUS SURGERY    . TUBAL LIGATION      Family History  Problem Relation Age of Onset  . Asthma Father   . Emphysema Father   . Alcohol abuse Father   . Diabetes Mellitus II Brother        x 2  . Hyperlipidemia Brother   . Hypertension Brother   . Pancreatic cancer Brother   . Heart disease Maternal Grandmother        hardening of the arteries  . Heart murmur Daughter   . Prostate cancer Brother   . Glaucoma Brother   . Breast cancer Neg Hx     Allergies  Allergen Reactions  . Peanut-Containing Drug Products Anaphylaxis  . Actos [Pioglitazone] Nausea Only  . Milk-Related Compounds Nausea And Vomiting    Milk and cheese  . Soy Allergy Nausea Only    Pt states had allergy testing and was told allergic to soy-causes nausea but no other reactions  . Vicodin [Hydrocodone-Acetaminophen] Nausea Only    nausea    Current Outpatient Medications on File Prior to Visit  Medication Sig Dispense Refill  . ACCU-CHEK FASTCLIX LANCETS MISC USE TO TEST BLOOD GLUCOSE 3-4 TIMES  DAILY 360 each 3  . alendronate (FOSAMAX) 70 MG tablet TAKE 1 TABLET BY MOUTH 1  TIME A WEEK. TAKE WITH A  FULL GLASS OF WATER ON AN  EMPTY STOMACH. 12 tablet 0  . Ascorbic Acid (VITAMIN C) 1000 MG tablet Take 1,000 mg by mouth as needed.    . Azelastine HCl 0.15 % SOLN Place 2 sprays into both nostrils daily. 90 mL 2  . Barberry-Oreg Grape-Goldenseal (BERBERINE COMPLEX PO) Take by  mouth daily.    . Blood Glucose Monitoring Suppl (ACCU-CHEK AVIVA PLUS) w/Device KIT     . cholecalciferol (VITAMIN D) 1000 units tablet Take 1,000 Units by mouth daily.    . ciclopirox (PENLAC) 8 % solution Apply topically at bedtime. Apply over nail and surrounding skin. Apply daily over previous coat. After seven (7) days, may remove with alcohol and continue cycle. 6.6 mL 2  . CINNAMON PO Take 1 tablet by mouth as needed.    . Cranberry 450 MG CAPS Take 1 capsule by mouth as needed.    . Cyanocobalamin (VITAMIN B-12 CR) 1500 MCG TBCR Take 1 tablet by mouth as needed.    . cycloSPORINE (RESTASIS) 0.05 % ophthalmic emulsion 1 drop 2 (two) times daily.    . Echinacea-Goldenseal (ECHINACEA COMB/GOLDEN SEAL PO) Take 1 tablet by mouth as needed. Reported on 12/16/2015    . fluticasone (FLONASE) 50 MCG/ACT nasal spray Place 1 spray into both nostrils 2 (two) times daily. 48 g 1  . gabapentin (NEURONTIN) 300 MG capsule TAKE 1 CAPSULE BY MOUTH 4  TIMES DAILY 360 capsule 0  . glucose blood (ACCU-CHEK GUIDE) test strip USE TO TEST BLOOD GLUCOSE THREE TIMES DAILY 300 each 3  . Green Tea, Camillia sinensis, (GREEN TEA PO) Take by mouth as needed. Reported on 12/16/2015    . Insulin Glargine (LANTUS SOLOSTAR) 100 UNIT/ML Solostar Pen Inject 14 Units into the skin daily at 10 pm. Inject 18-45 units at bedtime. 15 mL 3  . Insulin Pen Needle (BD PEN NEEDLE NANO U/F) 32G X 4 MM MISC USE TO TEST BLOOD GLUCOSE 4 TIMES DAILY 100 each 6  . Lactobacillus (ACIDOPHILUS PROBIOTIC PO) Take 1 tablet by mouth daily.    Marland Kitchen levocetirizine (XYZAL)  5 MG tablet Take 1 tablet (5 mg total) by mouth every evening. 90 tablet 2  . Melatonin 5 MG TABS Take 1 tablet by mouth as needed. Reported on 12/16/2015    . meloxicam (MOBIC) 15 MG tablet TAKE 1 TABLET BY MOUTH  DAILY 90 tablet 1  . Mometasone Furo-Formoterol Fum (DULERA IN) Inhale into the lungs.    . montelukast (SINGULAIR) 10 MG tablet TAKE 1 TABLET BY MOUTH EVERYDAY AT BEDTIME 90 tablet 0  . montelukast (SINGULAIR) 10 MG tablet Take 1 tablet (10 mg total) by mouth at bedtime. 90 tablet 2  . Omega-3 Fatty Acids (OMEGA 3 PO) Take 1 tablet by mouth as needed.    Marland Kitchen omeprazole (PRILOSEC) 40 MG capsule Take 40 mg by mouth as needed.     . ramipril (ALTACE) 2.5 MG capsule Take 1 capsule (2.5 mg total) by mouth daily. 90 capsule 3  . simvastatin (ZOCOR) 20 MG tablet TAKE 1 TABLET BY MOUTH  DAILY 90 tablet 0  . Spacer/Aero-Holding Chambers (AEROCHAMBER PLUS) inhaler Use as instructed with MDI 1 each 2  . Stinging Nettle 2 % POWD Take 1 tablet by mouth as needed. Reported on 12/16/2015    . traZODone (DESYREL) 50 MG tablet Take 0.5-1 tablets (25-50 mg total) by mouth at bedtime as needed for sleep. 30 tablet 3  . TURMERIC PO Take 800 mg by mouth as needed. Reported on 12/16/2015    . metFORMIN (GLUCOPHAGE-XR) 500 MG 24 hr tablet Take 1 tablet (500 mg total) by mouth 2 (two) times daily for 14 days. 180 tablet 1   No current facility-administered medications on file prior to visit.     BP 120/60   Temp 98.3 F (36.8 C)  Ht _0  (1.626 m)   Wt 139 lb (63 kg)   BMI 23.86 kg/m       Objective:   Physical Exam  Constitutional: She is oriented to person, place, and time. She appears well-developed and well-nourished. No distress.  HENT:  Head: Normocephalic and atraumatic.  Right Ear: External ear normal.  Left Ear: External ear normal.  Nose: Nose normal.  Mouth/Throat: Oropharynx is clear and moist. No oropharyngeal exudate.  Eyes: Pupils are equal, round, and reactive to light.  Conjunctivae and EOM are normal. Right eye exhibits no discharge. Left eye exhibits no discharge. No scleral icterus.  Neck: Normal range of motion. Neck supple. No JVD present. No tracheal deviation present. No thyromegaly present.  Cardiovascular: Normal rate, regular rhythm, normal heart sounds and intact distal pulses. Exam reveals no gallop and no friction rub.  No murmur heard. Pulmonary/Chest: Effort normal and breath sounds normal. No stridor. No respiratory distress. She has no wheezes. She has no rales. She exhibits no tenderness.  Abdominal: Soft. Bowel sounds are normal. She exhibits no distension and no mass. There is no tenderness. There is no rebound and no guarding.  Musculoskeletal: Normal range of motion. She exhibits no edema, tenderness or deformity.  Lymphadenopathy:    She has no cervical adenopathy.  Neurological: She is alert and oriented to person, place, and time. She displays normal reflexes. No cranial nerve deficit or sensory deficit. She exhibits normal muscle tone. Coordination normal.  Skin: Skin is warm and dry. Capillary refill takes less than 2 seconds. No rash noted. No erythema. No pallor.  Psychiatric: She has a normal mood and affect. Her behavior is normal. Judgment and thought content normal.  Nursing note and vitals reviewed.      Assessment & Plan:  1. Routine general medical examination at a health care facility - Basic metabolic panel - CBC with Differential/Platelet - Hemoglobin A1c - Hepatic function panel - Lipid panel - TSH  2. Diabetes mellitus without complication ([place]) - I would like to transition her off insulin. Will check A1c and likely increase Metformin and add glipizide  - Basic metabolic panel - CBC with Differential/Platelet - Hemoglobin A1c - Hepatic function panel - Lipid panel - TSH  3. Hyperlipidemia associated with type 2 diabetes mellitus ([place]) - Consider increase in statin  - Basic metabolic panel - CBC with  Differential/Platelet - Hemoglobin A1c - Hepatic function panel - Lipid panel - TSH  4. Need for hepatitis C screening test  - Hep C Antibody  5. Age-related osteoporosis without current pathological fracture  - Vitamin D, 25-hydroxy  [name], NP

## 2018-10-19 ENCOUNTER — Other Ambulatory Visit: Payer: Self-pay | Admitting: Adult Health

## 2018-10-19 LAB — HEPATITIS C ANTIBODY
HEP C AB: NONREACTIVE
SIGNAL TO CUT-OFF: 0.03 (ref <1.00)

## 2018-10-19 MED ORDER — METFORMIN HCL ER 500 MG PO TB24: 1000.0000 mg | ORAL_TABLET | Freq: Two times a day (BID) | ORAL | 0 refills | Status: DC

## 2018-10-19 NOTE — Telephone Encounter (Signed)
Sent to the pharmacy by e-scribe. 

## 2018-10-19 NOTE — Telephone Encounter (Signed)
Updated patient on her labs.   A1c is 6.8. I am ok with having her stop Lantus ( since she uses it irregularly). Will increase Metformin to 1000 mg BID and retest in 3 months

## 2018-10-22 ENCOUNTER — Other Ambulatory Visit: Payer: Self-pay

## 2018-10-22 ENCOUNTER — Ambulatory Visit (HOSPITAL_COMMUNITY)
Admission: EM | Admit: 2018-10-22 | Discharge: 2018-10-22 | Disposition: A | Discharge status: Home / Self Care | Payer: Medicare Other | Attending: Family Medicine | Admitting: Family Medicine

## 2018-10-22 ENCOUNTER — Ambulatory Visit: Payer: Self-pay

## 2018-10-22 ENCOUNTER — Encounter (HOSPITAL_COMMUNITY): Payer: Self-pay | Admitting: Emergency Medicine

## 2018-10-22 DIAGNOSIS — A084 Viral intestinal infection, unspecified: Secondary | ICD-10-CM

## 2018-10-22 LAB — POCT I-STAT, CHEM 8
BUN: 10 mg/dL (ref 8–23)
CALCIUM ION: 1.2 mmol/L (ref 1.15–1.40)
Chloride: 105 mmol/L (ref 98–111)
Creatinine, Ser: 0.7 mg/dL (ref 0.44–1.00)
Glucose, Bld: 119 mg/dL — ABNORMAL HIGH (ref 70–99)
HEMATOCRIT: 42 % (ref 36.0–46.0)
Hemoglobin: 14.3 g/dL (ref 12.0–15.0)
Potassium: 4.3 mmol/L (ref 3.5–5.1)
SODIUM: 142 mmol/L (ref 135–145)
TCO2: 27 mmol/L (ref 22–32)

## 2018-10-22 MED ORDER — ALUM & MAG HYDROXIDE-SIMETH 200-200-20 MG/5ML PO SUSP
ORAL | Status: AC
  Filled 2018-10-22: qty 30

## 2018-10-22 MED ORDER — ONDANSETRON 4 MG PO TBDP
ORAL_TABLET | ORAL | Status: AC
  Filled 2018-10-22: qty 1

## 2018-10-22 MED ORDER — ONDANSETRON HCL 4 MG PO TABS: 4.0000 mg | ORAL_TABLET | Freq: Four times a day (QID) | ORAL | 0 refills | Status: DC

## 2018-10-22 MED ORDER — ALUM & MAG HYDROXIDE-SIMETH 200-200-20 MG/5ML PO SUSP
30.0000 mL | Freq: Once | ORAL | Status: AC
  Administered 2018-10-22: 30 mL via ORAL

## 2018-10-22 MED ORDER — ONDANSETRON 4 MG PO TBDP
4.0000 mg | ORAL_TABLET | Freq: Once | ORAL | Status: AC
  Administered 2018-10-22: 4 mg via ORAL

## 2018-10-22 MED ORDER — LIDOCAINE VISCOUS HCL 2 % MT SOLN
OROMUCOSAL | Status: AC
  Filled 2018-10-22: qty 15

## 2018-10-22 MED ORDER — LIDOCAINE VISCOUS HCL 2 % MT SOLN
15.0000 mL | Freq: Once | OROMUCOSAL | Status: AC
  Administered 2018-10-22: 15 mL via ORAL

## 2018-10-22 NOTE — Discharge Instructions (Signed)
Continue taking your omeprazole to reduce stomach acid Drink plenty of fluids Take Zofran as needed for nausea or vomiting Return to emergency room if abdominal pain and vomiting are worse instead of better

## 2018-10-22 NOTE — ED Provider Notes (Signed)
[place]    CSN: [id number] Arrival date & time: 10/22/18  1850     History   Chief Complaint Chief Complaint  Patient presents with  . Nausea  . Emesis  . Abdominal Pain    HPI Kimberly Lam is a 72 y.o. female.   HPI  Patient is here for nausea vomiting and abdominal pain.  It started after breakfast.  She states that she had a good breakfast this morning with waffles the next, then threw it up.  She is been having waves of upper abdominal pain and nausea ever since then.  She has not eaten very much.  She did take sips of ginger ale and kept that down.  She is a diabetic but not on insulin and her sugar usually runs pretty well.  She has not had a glucometer at home to check her sugar for some time.  She is uncertain if her sugar is high.  She feels dizzy when she stands up.  She had 2 or 3 loose bowel movements this morning but no diarrhea.  The abdominal pain comes in waves.  She is had 2 cesarean sections.  No other abdominal surgeries.  No recent dyspepsia or acid reflux.  No history of gallbladder disease.  She did have a colonoscopy at 17.  She had a colon polyp removed.  She thinks she has diverticulosis but she is uncertain.  She states that she has felt like she had fever and chills.  No temperature on arrival.  She has not had any medicine today for pain or fever  Past Medical History:  Diagnosis Date  . Acid reflux   . Allergic rhinitis   . Anemia   . Asthma   . Bilateral sciatica   . Chronic bilateral low back pain   . Colon polyps   . Diverticulosis   . DM type 2 (diabetes mellitus, type 2) ([place])   . Environmental allergies    flowers, pollen, trees  . GERD (gastroesophageal reflux disease)   . Hyperlipidemia   . Low back pain   . Migraine     Patient Active Problem List   Diagnosis Date Noted  . Right lumbar radiculopathy 01/23/2018  . GERD (gastroesophageal reflux disease) 09/14/2017  . Diabetes mellitus without complication ([place])  42/59/5638  . 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 Moderate persistent asthma without complication 75/64/3329  . Perennial and seasonal allergic rhinoconjunctivitis 12/16/2015  . Lumbar radiculopathy 11/25/2015    Past Surgical History:  Procedure Laterality Date  . CATARACT EXTRACTION, BILATERAL Bilateral 2012  . CESAREAN SECTION     x 2  . DILATION AND CURETTAGE OF UTERUS    . NASAL SINUS SURGERY    . TUBAL LIGATION      OB History    Gravida  0   Para  0   Term  0   Preterm  0   AB  0   Living        SAB  0   TAB  0   Ectopic  0   Multiple      Live Births               Home Medications    Prior to Admission medications   Medication Sig Start Date End Date Taking? Authorizing Provider  ACCU-CHEK FASTCLIX LANCETS MISC USE TO TEST BLOOD GLUCOSE 3-4 TIMES DAILY 07/25/18  Yes Nafziger, Tommi Rumps, NP  alendronate (FOSAMAX) 70 MG tablet TAKE  1 TABLET BY MOUTH 1  TIME A WEEK. TAKE WITH A  FULL GLASS OF WATER ON AN  EMPTY STOMACH. 07/19/18  Yes Nafziger, Tommi Rumps, NP  Ascorbic Acid (VITAMIN C) 1000 MG tablet Take 1,000 mg by mouth as needed.   Yes [provider]  Azelastine HCl 0.15 % SOLN Place 2 sprays into both nostrils daily. 10/12/18  Yes Ambs, Kathrine Cords, FNP  Barberry-Oreg Grape-Goldenseal (BERBERINE COMPLEX PO) Take by mouth daily.   Yes [provider]  Blood Glucose Monitoring Suppl (ACCU-CHEK AVIVA PLUS) w/Device KIT  02/14/17  Yes [provider]  cholecalciferol (VITAMIN D) 1000 units tablet Take 1,000 Units by mouth daily.   Yes [provider]  CINNAMON PO Take 1 tablet by mouth as needed.   Yes [provider]  Cranberry 450 MG CAPS Take 1 capsule by mouth as needed.   Yes [provider]  Cyanocobalamin (VITAMIN B-12 CR) 1500 MCG TBCR Take 1 tablet by mouth as needed.   Yes [provider]  Echinacea-Goldenseal (ECHINACEA COMB/GOLDEN SEAL PO) Take 1 tablet by mouth as  needed. Reported on 12/16/2015   Yes [provider]  gabapentin (NEURONTIN) 300 MG capsule TAKE 1 CAPSULE BY MOUTH 4  TIMES DAILY 07/18/18  Yes Marcial Pacas, MD  glucose blood (ACCU-CHEK GUIDE) test strip USE TO TEST BLOOD GLUCOSE THREE TIMES DAILY 03/20/18  Yes Nafziger, Tommi Rumps, NP  Green Tea, Camillia sinensis, (GREEN TEA PO) Take by mouth as needed. Reported on 12/16/2015   Yes [provider]  Lactobacillus (ACIDOPHILUS PROBIOTIC PO) Take 1 tablet by mouth daily.   Yes [provider]  levocetirizine (XYZAL) 5 MG tablet Take 1 tablet (5 mg total) by mouth every evening. 10/12/18  Yes Ambs, Kathrine Cords, FNP  Melatonin 5 MG TABS Take 1 tablet by mouth as needed. Reported on 12/16/2015   Yes [provider]  meloxicam (MOBIC) 15 MG tablet TAKE 1 TABLET BY MOUTH  DAILY 10/16/18  Yes Nafziger, Tommi Rumps, NP  metFORMIN (GLUCOPHAGE-XR) 500 MG 24 hr tablet Take 2 tablets (1,000 mg total) by mouth 2 (two) times daily. 10/19/18 01/17/19 Yes Nafziger, Tommi Rumps, NP  Mometasone Furo-Formoterol Fum (DULERA IN) Inhale into the lungs.   Yes [provider]  montelukast (SINGULAIR) 10 MG tablet TAKE 1 TABLET BY MOUTH EVERYDAY AT BEDTIME 10/11/18  Yes Ambs, Kathrine Cords, FNP  Omega-3 Fatty Acids (OMEGA 3 PO) Take 1 tablet by mouth as needed.   Yes [provider]  omeprazole (PRILOSEC) 40 MG capsule Take 40 mg by mouth as needed.    Yes [provider]  ramipril (ALTACE) 2.5 MG capsule Take 1 capsule (2.5 mg total) by mouth daily. 06/20/18  Yes Nafziger, Tommi Rumps, NP  simvastatin (ZOCOR) 20 MG tablet TAKE 1 TABLET BY MOUTH  DAILY 10/19/18  Yes Nafziger, Tommi Rumps, NP  Spacer/Aero-Holding Chambers (AEROCHAMBER PLUS) inhaler Use as instructed with MDI 12/16/15  Yes Bobbitt, Sed[place] Muta, MD  Stinging Nettle 2 % POWD Take 1 tablet by mouth as needed. Reported on 12/16/2015   Yes [provider]  traZODone (DESYREL) 50 MG tablet Take 0.5-1 tablets (25-50 mg total) by mouth at bedtime  as needed for sleep. 06/01/18  Yes Nafziger, Tommi Rumps, NP  TURMERIC PO Take 800 mg by mouth as needed. Reported on 12/16/2015   Yes [provider]  fluticasone (FLONASE) 50 MCG/ACT nasal spray Place 1 spray into both nostrils 2 (two) times daily. 06/05/17   Bobbitt, Sedalia Muta, MD  montelukast (SINGULAIR) 10  MG tablet Take 1 tablet (10 mg total) by mouth at bedtime. 10/12/18   Dara Hoyer, FNP  ondansetron (ZOFRAN) 4 MG tablet Take 1 tablet (4 mg total) by mouth every 6 (six) hours. 10/22/18   Raylene Everts, MD    Family History Family History  Problem Relation Age of Onset  . Asthma Father   . Emphysema Father   . Alcohol abuse Father   . Diabetes Mellitus II Brother        x 2  . Hyperlipidemia Brother   . Hypertension Brother   . Pancreatic cancer Brother   . Heart disease Maternal Grandmother        hardening of the arteries  . Heart murmur Daughter   . Prostate cancer Brother   . Glaucoma Brother   . Breast cancer Neg Hx     Social History Social History   Tobacco Use  . Smoking status: Never Smoker  . Smokeless tobacco: Never Used  Substance Use Topics  . Alcohol use: No    Alcohol/week: 0.0 standard drinks  . Drug use: No     Allergies   Peanut-containing drug products; Actos [pioglitazone]; Milk-related compounds; Soy allergy; and Vicodin [hydrocodone-acetaminophen]   Review of Systems Review of Systems  Constitutional: Positive for fever. Negative for chills.  HENT: Negative for ear pain and sore throat.   Eyes: Negative for pain and visual disturbance.  Respiratory: Negative for cough and shortness of breath.   Cardiovascular: Negative for chest pain and palpitations.  Gastrointestinal: Positive for abdominal pain, nausea and vomiting.       Loose bowel movements, not diarrhea  Genitourinary: Negative for difficulty urinating, dysuria and hematuria.  Musculoskeletal: Negative for arthralgias and back pain.  Skin: Negative for color change and  rash.  Neurological: Negative for seizures and syncope.  All other systems reviewed and are negative.    Physical Exam Triage Vital Signs ED Triage Vitals  Enc Vitals Group     BP 10/22/18 1955 (!) 155/66     Pulse Rate 10/22/18 1955 67     Resp --      Temp 10/22/18 1955 98.2 F (36.8 C)     Temp Source 10/22/18 1955 Oral     SpO2 10/22/18 1955 100 %     Weight --      Height --      Head Circumference --      Peak Flow --      Pain Score 10/22/18 1956 0     Pain Loc --      Pain Edu? --      Excl. in [place]? --    No data found.  Updated Vital Signs BP 123/62   Pulse 67   Temp 98.4 F (36.9 C)   Resp 18   SpO2 97%       Physical Exam  Constitutional: She is oriented to person, place, and time. She appears well-developed and well-nourished. No distress.  HENT:  Head: Normocephalic and atraumatic.  Mouth/Throat: Oropharynx is clear and moist.  Mucous membranes moist  Eyes: Pupils are equal, round, and reactive to light. Conjunctivae are normal.  Neck: Normal range of motion.  Cardiovascular: Normal rate, regular rhythm and normal heart sounds.  Pulmonary/Chest: Effort normal and breath sounds normal. No respiratory distress.  Abdominal: Soft. Bowel sounds are normal. She exhibits no distension. There is no hepatosplenomegaly. There is generalized tenderness and tenderness in the epigastric area. There is no rigidity, no rebound and no  guarding.  Musculoskeletal: Normal range of motion. She exhibits no edema.  Neurological: She is alert and oriented to person, place, and time.  Skin: Skin is warm and dry.  Psychiatric: She has a normal mood and affect. Her behavior is normal.     UC Treatments / Results  Labs (all labs ordered are listed, but only abnormal results are displayed) Labs Reviewed  POCT I-STAT, CHEM 8 - Abnormal; Notable for the following components:      Result Value   Glucose, Bld 119 (*)    All other components within normal limits     EKG None  Radiology No results found.  Procedures Procedures (including critical care time)  Medications Ordered in UC Medications  ondansetron (ZOFRAN-ODT) disintegrating tablet 4 mg (4 mg Oral Given 10/22/18 2040)  alum & mag hydroxide-simeth (MAALOX/MYLANTA) 200-200-20 MG/5ML suspension 30 mL (30 mLs Oral Given 10/22/18 2048)    And  lidocaine (XYLOCAINE) 2 % viscous mouth solution 15 mL (15 mLs Oral Given 10/22/18 2048)    Initial Impression / Assessment and Plan / UC Course  I have reviewed the triage vital signs and the nursing notes.  Pertinent labs & imaging results that were available during my care of the patient were reviewed by me and considered in my medical decision making (see chart for details).    Consideration for an acute abdomen, PUD, gallbladder disease, pancreatitis, gastroenteritis. She was given Zofran and allowed to wait a few minutes.  This was followed by a GI cocktail.  20 minutes after GI cocktail she felt much better.  She complained of her mouth being numb but her abdominal pain was improved.  I explained to her that fever with abdominal pain nausea and vomiting is most likely a viral gastroenteritis.  We talked about treatment.  We talked about advancement of food, and reasons to return to the emergency room. Final Clinical Impressions(s) / UC Diagnoses   Final diagnoses:  Viral gastroenteritis     Discharge Instructions     Continue taking your omeprazole to reduce stomach acid Drink plenty of fluids Take Zofran as needed for nausea or vomiting Return to emergency room if abdominal pain and vomiting are worse instead of better     ED Prescriptions    Medication Sig Dispense Auth. Provider   ondansetron (ZOFRAN) 4 MG tablet Take 1 tablet (4 mg total) by mouth every 6 (six) hours. 12 tablet Raylene Everts, MD     Controlled Substance Prescriptions [place] Controlled Substance Registry consulted? Not Applicable   Raylene Everts,  MD 10/22/18 2116

## 2018-10-22 NOTE — Telephone Encounter (Signed)
FYI

## 2018-10-22 NOTE — ED Triage Notes (Signed)
Pt states she had mid upper abdominal pain earlier today she described as pressure and bloating.  She states she had a BM and then vomited twice.  She felt better after that episode, but then she states she had a second episode about 1.5 hours later with the pressure and bloating.  Since that time she states it has happened one more time and each episode has been less severe.  She reports hot flashes between these episodes.

## 2018-10-22 NOTE — Telephone Encounter (Signed)
  Patient called to say that she need to speak with a nurse stated that earlier in the day she felt a pressure in her upper abdomen but more in the lower part of the ribs. Stated that she felt like she had to use the bathroom and have a BM but that didn't happen and then she felt like vomiting which she did until she knew she was empty. But stated that the feeling keep coming back. Pressure began at 2:30 today and was gradual onset. The pressure comes and goes. Pt stated she had a BM and became nauseated and vomited. Pt stated she broke out in a sweat. Pt stated the upper abdomen feel more "annoying" than pain. Pt had collard greens and stated that she shouldn't have eaten them. Pt took a gas pill and at the time of the call she was feeling better but could still feel pressure. Pt is tolerating ginger ale. Pt stated the vomiting made her feel weak. No available appointments. Pt advised to go to [facility] for evaluation. Reason for Disposition . [1] MILD-MODERATE pain AND [2] not relieved by antacids  Answer Assessment - Initial Assessment Questions 1. LOCATION: "Where does it hurt?"      epigastrc area 2. RADIATION: "Does the pain shoot anywhere else?" (e.g., chest, back)     no 3. ONSET: "When did the pain begin?" (e.g., minutes, hours or days ago)      Today at 2:30 4. SUDDEN: "Gradual or sudden onset?"     grqadual 5. PATTERN "Does the pain come and go, or is it constant?"    - If constant: "Is it getting better, staying the same, or worsening?"      (Note: Constant means the pain never goes away completely; most serious pain is constant and it progresses)     - If intermittent: "How long does it last?" "Do you have pain now?"     (Note: Intermittent means the pain goes away completely between bouts)     Comes and goes "feels pressure" 6. SEVERITY: "How bad is the pain?"  (e.g., Scale 1-10; mild, moderate, or severe)    - MILD (1-3): doesn't interfere with normal activities, abdomen soft and not  tender to touch     - MODERATE (4-7): interferes with normal activities or awakens from sleep, tender to touch     - SEVERE (8-10): excruciating pain, doubled over, unable to do any normal activities       Mild more annoying than pain 7. RECURRENT SYMPTOM: "Have you ever had this type of abdominal pain before?" If so, ask: "When was the last time?" and "What happened that time?"      no 8. AGGRAVATING FACTORS: "Does anything seem to cause this pain?" (e.g., foods, stress, alcohol)     Pt thinks it may be collard greens 9. CARDIAC SYMPTOMS: "Do you have any of the following symptoms: chest pain, difficulty breathing, sweating, nausea?"      sweating 10. OTHER SYMPTOMS: "Do you have any other symptoms?" (e.g., fever, vomiting, diarrhea)       Nausea, weakness, had a BM 11. PREGNANCY: "Is there any chance you are pregnant?" "When was your last menstrual period?"       n/a  Protocols used: ABDOMINAL PAIN - UPPER-A-AH

## 2018-10-23 ENCOUNTER — Encounter: Payer: Self-pay | Admitting: Neurology

## 2018-10-23 ENCOUNTER — Encounter

## 2018-10-23 ENCOUNTER — Ambulatory Visit: Payer: Medicare Other | Admitting: Neurology

## 2018-10-23 VITALS — BP 108/59 | HR 67 | Ht 64.0 in | Wt 137.0 lb

## 2018-10-23 DIAGNOSIS — M5416 Radiculopathy, lumbar region: Secondary | ICD-10-CM

## 2018-10-23 MED ORDER — GABAPENTIN 300 MG PO CAPS: 300.0000 mg | ORAL_CAPSULE | Freq: Four times a day (QID) | ORAL | 4 refills | Status: DC

## 2018-10-23 NOTE — Progress Notes (Signed)
PATIENT: Kimberly Lam DOB: 11-11-1946  Chief Complaint  Patient presents with  . Back Pain    Reports low back pain radiating to both legs, along with significant cramping.  She has continued taking gabapentin 329m, one tablet four times daily.    . Tremors    Tremors are intermittent.  They are no worse but do cause her writing difficulty.      HISTORICAL  Kimberly Lam a 72years old female, seen in refer by her primary care nurse practitioner NDorothyann Pengfor evaluation of leg cramping, tremors, initial evaluation was on January 23, 2018.    She has past medical history of hypertension, diabetes, hyperlipidemia  I saw her initially in 2016 for evaluation of low back pain, radiating pain to bilateral hip, bilateral leg muscle cramping, and weakness.  She has history of low back pain since 2006, midline low back, occasionally go to posterior thigh, go down below her knee, worsening with prolonged standing, or walking,  Previous her low back pain has responded very well to epidural injection, but the benefit gradually fades away, over the years, she complains of worsening low back pain, radiating pain to bilateral lower extremity again, in addition, she complains of bilateral feet paresthesia,   She had a history of diabetes, she denies gait difficulty, no bowel and bladder incontinence, she denies bilateral upper extremity paresthesia or weakness.   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   [place]. [place]iso [place] was diagnosed with degenerative spondylolisthesis L4-L5, L5-S1, neurogenic claudication, low back pain, received epidural injection in Feb 2015, benefit lasted about one year  MRI of lumbar spine December 2016, There is a transitional L6/S1 vertebral body.  8 mm of anterolisthesis of L5 upon L6/S1 of a degenerative nature due to severe facet hypertrophy. At this level, there is mild spinal stenosis and moderately severe right foraminal narrowing. There could be compression of the right L5 nerve root.  Degenerative changes at L6/S1-S2 lead to severe right foraminal narrowing that could lead to compression of the exiting right S1 nerve root  She continue complains of chronic low back pain, today she also complains of new onset right hand shaking, that was intermittent, getting worse when she holding utensils, writing, mild involvement on the left side, she continue complains of frequent lower extremity cramping especially on the right side, difficulty to walk in the morning.  There was no family history of tremor  UPDATE Nov 5th 2019: She was given prescription of gabapentin for her bilateral feet paresthesia, 300 mg 4 times a day does help her lower extremity cramps, but complains of daytime sleepiness, but she often woke up in the middle of the night complains of left muscle cramping, difficulty going to sleep,  We again personally reviewed MRI of lumbar spine in December 2016, transitional L6-S1 vertebral body, 8 mm of anterolisthesis of L5 upon S1 with severe facet hypertrophy, mild spinal stenosis, moderate severe right foraminal narrowing, potential compression of right L5 nerve roots,  REVIEW OF SYSTEMS: Full 14 system review of systems performed and notable only for as above  ALLERGIES: Allergies  Allergen Reactions  . Peanut-Containing Drug Products Anaphylaxis  . Actos [Pioglitazone] Nausea Only  . Milk-Related Compounds Nausea And Vomiting    Milk and cheese  . Soy Allergy Nausea Only  Pt states had allergy testing and was told allergic to soy-causes nausea but no other reactions  . Vicodin [Hydrocodone-Acetaminophen] Nausea Only    nausea    HOME MEDICATIONS: Current Outpatient Medications  Medication Sig Dispense Refill  .  ACCU-CHEK FASTCLIX LANCETS MISC USE TO TEST BLOOD GLUCOSE 3-4 TIMES DAILY 360 each 3  . alendronate (FOSAMAX) 70 MG tablet TAKE 1 TABLET BY MOUTH 1  TIME A WEEK. TAKE WITH A  FULL GLASS OF WATER ON AN  EMPTY STOMACH. 12 tablet 0  . Ascorbic Acid (VITAMIN C) 1000 MG tablet Take 1,000 mg by mouth as needed.    . Azelastine HCl 0.15 % SOLN Place 2 sprays into both nostrils daily. 90 mL 2  . Barberry-Oreg Grape-Goldenseal (BERBERINE COMPLEX PO) Take by mouth daily.    . Blood Glucose Monitoring Suppl (ACCU-CHEK AVIVA PLUS) w/Device KIT     . cholecalciferol (VITAMIN D) 1000 units tablet Take 1,000 Units by mouth daily.    Marland Kitchen CINNAMON PO Take 1 tablet by mouth as needed.    . Cranberry 450 MG CAPS Take 1 capsule by mouth as needed.    . Cyanocobalamin (VITAMIN B-12 CR) 1500 MCG TBCR Take 1 tablet by mouth as needed.    . Echinacea-Goldenseal (ECHINACEA COMB/GOLDEN SEAL PO) Take 1 tablet by mouth as needed. Reported on 12/16/2015    . fluticasone (FLONASE) 50 MCG/ACT nasal spray Place 1 spray into both nostrils 2 (two) times daily. 48 g 1  . gabapentin (NEURONTIN) 300 MG capsule TAKE 1 CAPSULE BY MOUTH 4  TIMES DAILY 360 capsule 0  . glucose blood (ACCU-CHEK GUIDE) test strip USE TO TEST BLOOD GLUCOSE THREE TIMES DAILY 300 each 3  . Green Tea, Camillia sinensis, (GREEN TEA PO) Take by mouth as needed. Reported on 12/16/2015    . Lactobacillus (ACIDOPHILUS PROBIOTIC PO) Take 1 tablet by mouth daily.    Marland Kitchen levocetirizine (XYZAL) 5 MG tablet Take 1 tablet (5 mg total) by mouth every evening. 90 tablet 2  . Melatonin 5 MG TABS Take 1 tablet by mouth as needed. Reported on 12/16/2015    . meloxicam (MOBIC) 15 MG tablet TAKE 1 TABLET BY MOUTH  DAILY 90 tablet 1  . metFORMIN (GLUCOPHAGE-XR) 500 MG 24 hr tablet Take 2 tablets (1,000 mg total) by mouth 2 (two) times daily. 360 tablet 0  . Mometasone Furo-Formoterol Fum (DULERA IN) Inhale into the lungs.    . montelukast (SINGULAIR) 10 MG tablet Take 1 tablet (10  mg total) by mouth at bedtime. 90 tablet 2  . Omega-3 Fatty Acids (OMEGA 3 PO) Take 1 tablet by mouth as needed.    Marland Kitchen omeprazole (PRILOSEC) 40 MG capsule Take 40 mg by mouth as needed.     . ondansetron (ZOFRAN) 4 MG tablet Take 1 tablet (4 mg total) by mouth every 6 (six) hours. 12 tablet 0  . Polyethyl Glycol-Propyl Glycol (SYSTANE FREE OP) Apply 1 drop to eye daily.    . ramipril (ALTACE) 2.5 MG capsule Take 1 capsule (2.5 mg total) by mouth daily. 90 capsule 3  . simvastatin (ZOCOR) 20 MG tablet TAKE 1 TABLET BY MOUTH  DAILY 90 tablet 3  . Spacer/Aero-Holding Chambers (AEROCHAMBER PLUS) inhaler Use as instructed with MDI 1 each 2  . Stinging Nettle 2 % POWD Take 1 tablet by mouth as needed. Reported on 12/16/2015    . traZODone (DESYREL) 50 MG tablet Take 0.5-1 tablets (25-50 mg total) by mouth at bedtime as needed for  sleep. 30 tablet 3  . TURMERIC PO Take 800 mg by mouth as needed. Reported on 12/16/2015     No current facility-administered medications for this visit.     PAST MEDICAL HISTORY: Past Medical History:  Diagnosis Date  . Acid reflux   . Allergic rhinitis   . Anemia   . Asthma   . Bilateral sciatica   . Chronic bilateral low back pain   . Colon polyps   . Diverticulosis   . DM type 2 (diabetes mellitus, type 2) ([place])   . Environmental allergies    flowers, pollen, trees  . GERD (gastroesophageal reflux disease)   . Hyperlipidemia   . Low back pain   . Migraine     PAST SURGICAL HISTORY: Past Surgical History:  Procedure Laterality Date  . CATARACT EXTRACTION, BILATERAL Bilateral 2012  . CESAREAN SECTION     x 2  . DILATION AND CURETTAGE OF UTERUS    . NASAL SINUS SURGERY    . TUBAL LIGATION      FAMILY HISTORY: Family History  Problem Relation Age of Onset  . Asthma Father   . Emphysema Father   . Alcohol abuse Father   . Diabetes Mellitus II Brother        x 2  . Hyperlipidemia Brother   . Hypertension Brother   . Pancreatic cancer Brother     . Heart disease Maternal Grandmother        hardening of the arteries  . Heart murmur Daughter   . Prostate cancer Brother   . Glaucoma Brother   . Breast cancer Neg Hx     SOCIAL HISTORY:  Social History   Socioeconomic History  . Marital status: Married    Spouse name: Not on file  . Number of children: 2  . Years of education: 87  . Highest education level: Not on file  Occupational History  . Occupation: Retired  Scientific laboratory technician  . Financial resource strain: Not on file  . Food insecurity:    Worry: Not on file    Inability: Not on file  . Transportation needs:    Medical: Not on file    Non-medical: Not on file  Tobacco Use  . Smoking status: Never Smoker  . Smokeless tobacco: Never Used  Substance and Sexual Activity  . Alcohol use: No    Alcohol/week: 0.0 standard drinks  . Drug use: No  . Sexual activity: Not on file  Lifestyle  . Physical activity:    Days per week: Not on file    Minutes per session: Not on file  . Stress: Not on file  Relationships  . Social connections:    Talks on phone: Not on file    Gets together: Not on file    Attends religious service: Not on file    Active member of club or organization: Not on file    Attends meetings of clubs or organizations: Not on file    Relationship status: Not on file  . Intimate partner violence:    Fear of current or ex partner: Not on file    Emotionally abused: Not on file    Physically abused: Not on file    Forced sexual activity: Not on file  Other Topics Concern  . Not on file  Social History Narrative   ** Merged History Encounter **       ** Data from: 11/23/15 Enc Dept: [facility]   Married 1 son one daughter,  relocated to [place] after living in [place] for many years. She is a Writer of [employer]. No caffeine.       ** Data from: 11/25/15 Enc Dept: [facility]   Lives at home with her husband and brother. Right-handed. No caffeine use.      PHYSICAL EXAM   Vitals:   10/23/18 1102  BP: (!) 108/59  Pulse: 67  Weight: 137 lb (62.1 kg)  Height: 5' 4" (1.626 m)    Not recorded      Body mass index is 23.52 kg/m.  PHYSICAL EXAMNIATION:  Gen: NAD, conversant, well nourised, obese, well groomed                     Cardiovascular: Regular rate rhythm, no peripheral edema, warm, nontender. Eyes: Conjunctivae clear without exudates or hemorrhage Neck: Supple, no carotid bruits. Pulmonary: Clear to auscultation bilaterally   NEUROLOGICAL EXAM:  MENTAL STATUS: Speech:    Speech is normal; fluent and spontaneous with normal comprehension.  Cognition:     Orientation to time, place and person     Normal recent and remote memory     Normal Attention span and concentration     Normal Language, naming, repeating,spontaneous speech     Fund of knowledge   CRANIAL NERVES: CN II: Visual fields are full to confrontation. Pupils are round equal and briskly reactive to light. CN III, IV, VI: extraocular movement are normal. No ptosis. CN V: Facial sensation is intact to pinprick in all 3 divisions bilaterally. Corneal responses are intact.  CN VII: Face is symmetric with normal eye closure and smile. CN VIII: Hearing is normal to rubbing fingers CN IX, X: Palate elevates symmetrically. Phonation is normal. CN XI: Head turning and shoulder shrug are intact CN XII: Tongue is midline with normal movements and no atrophy.  MOTOR: There is no pronator drift of out-stretched arms. Muscle bulk and tone are normal. Muscle strength is normal.  REFLEXES: Reflexes are 2+ and symmetric at the biceps, triceps, knees, and absent at ankles. Plantar responses are flexor.  SENSORY: Length dependent decreased to light touch, pinprick and vibratory sensation are intact in fingers and toes.  COORDINATION: Rapid alternating movements and fine finger movements are intact. There is no dysmetria on finger-to-nose and heel-knee-shin.     GAIT/STANCE: Nonantalgic, difficulty performing tandem walking   DIAGNOSTIC DATA (LABS, IMAGING, TESTING) - I reviewed patient records, labs, notes, testing and imaging myself where available.   ASSESSMENT AND PLAN  Kaylynn Chamblin is a 72 y.o. female    Chronic low back pain, radiating pain to bilateral lower extremity             Consistent with lumbar stenosis             She has no significant gait difficulty, distal weakness,            Partial response to gabapentin, frequent muscle cramping, wake her up from sleep,  She wants to be referred to neurosurgeon for further evaluation,  May change the gabapentin scheduled to 300 mg 2 tablets at nighttime, 1 tablets as needed during the daytime  She also has insulin-dependent diabetes, evidence of diabetic peripheral neuropathy    Marcial Pacas, M.D. Ph.D.  [facility] [address], [place], [place] [address] Ph: [phone] Fax: [phone]  CC: Referring Provider

## 2018-11-06 DIAGNOSIS — J301 Allergic rhinitis due to pollen: Secondary | ICD-10-CM | POA: Diagnosis not present

## 2018-11-06 NOTE — Progress Notes (Signed)
VIALS EXP 11-08-19 

## 2018-11-07 ENCOUNTER — Ambulatory Visit (INDEPENDENT_AMBULATORY_CARE_PROVIDER_SITE_OTHER): Payer: Medicare Other | Admitting: *Deleted

## 2018-11-07 DIAGNOSIS — J309 Allergic rhinitis, unspecified: Secondary | ICD-10-CM

## 2018-11-08 DIAGNOSIS — J3089 Other allergic rhinitis: Secondary | ICD-10-CM | POA: Diagnosis not present

## 2018-11-09 ENCOUNTER — Other Ambulatory Visit: Payer: Self-pay | Admitting: Neurological Surgery

## 2018-11-09 DIAGNOSIS — M5416 Radiculopathy, lumbar region: Secondary | ICD-10-CM

## 2018-11-12 ENCOUNTER — Ambulatory Visit: Payer: Self-pay

## 2018-11-12 DIAGNOSIS — J3081 Allergic rhinitis due to animal (cat) (dog) hair and dander: Secondary | ICD-10-CM | POA: Diagnosis not present

## 2018-11-14 ENCOUNTER — Ambulatory Visit: Payer: Medicare Other | Admitting: Neurology

## 2018-11-14 ENCOUNTER — Ambulatory Visit
Admission: RE | Admit: 2018-11-14 | Discharge: 2018-11-14 | Disposition: A | Discharge status: Home / Self Care | Payer: Medicare Other | Source: Ambulatory Visit | Attending: Adult Health | Admitting: Adult Health

## 2018-11-14 DIAGNOSIS — Z1231 Encounter for screening mammogram for malignant neoplasm of breast: Secondary | ICD-10-CM

## 2018-11-21 ENCOUNTER — Ambulatory Visit (INDEPENDENT_AMBULATORY_CARE_PROVIDER_SITE_OTHER): Payer: Medicare Other | Admitting: *Deleted

## 2018-11-21 DIAGNOSIS — J309 Allergic rhinitis, unspecified: Secondary | ICD-10-CM

## 2018-11-24 ENCOUNTER — Other Ambulatory Visit: Payer: Self-pay | Admitting: Adult Health

## 2018-11-27 NOTE — Telephone Encounter (Signed)
Sent to the pharmacy by e-scribe. 

## 2018-11-28 ENCOUNTER — Other Ambulatory Visit: Payer: Self-pay | Admitting: Neurology

## 2018-11-28 ENCOUNTER — Other Ambulatory Visit: Payer: Self-pay

## 2018-11-29 ENCOUNTER — Encounter: Payer: Self-pay | Admitting: Adult Health

## 2018-11-29 ENCOUNTER — Other Ambulatory Visit: Payer: Self-pay | Admitting: Adult Health

## 2018-12-04 ENCOUNTER — Telehealth: Payer: Self-pay

## 2018-12-04 NOTE — Telephone Encounter (Signed)
Copied from [place] [id number]. Topic: General - Other >> Dec 04, 2018  4:37 PM Valla Leaver wrote: Reason for CRM: Patient will be in tomorrow to pick up [place] sample per [place] message from [place] on 11/29/2018.

## 2018-12-04 NOTE — Telephone Encounter (Signed)
Sent as FYI. 

## 2018-12-05 ENCOUNTER — Ambulatory Visit (INDEPENDENT_AMBULATORY_CARE_PROVIDER_SITE_OTHER): Payer: Medicare Other | Admitting: *Deleted

## 2018-12-05 DIAGNOSIS — J309 Allergic rhinitis, unspecified: Secondary | ICD-10-CM

## 2018-12-10 ENCOUNTER — Ambulatory Visit (INDEPENDENT_AMBULATORY_CARE_PROVIDER_SITE_OTHER): Payer: Medicare Other

## 2018-12-10 DIAGNOSIS — J309 Allergic rhinitis, unspecified: Secondary | ICD-10-CM | POA: Diagnosis not present

## 2018-12-24 ENCOUNTER — Ambulatory Visit (INDEPENDENT_AMBULATORY_CARE_PROVIDER_SITE_OTHER): Payer: Medicare HMO | Admitting: *Deleted

## 2018-12-24 DIAGNOSIS — J309 Allergic rhinitis, unspecified: Secondary | ICD-10-CM | POA: Diagnosis not present

## 2018-12-25 ENCOUNTER — Ambulatory Visit
Admission: RE | Admit: 2018-12-25 | Discharge: 2018-12-25 | Disposition: A | Discharge status: Home / Self Care | Payer: Medicare HMO | Source: Ambulatory Visit | Attending: Neurological Surgery | Admitting: Neurological Surgery

## 2018-12-25 DIAGNOSIS — M47817 Spondylosis without myelopathy or radiculopathy, lumbosacral region: Secondary | ICD-10-CM | POA: Diagnosis not present

## 2018-12-25 DIAGNOSIS — M5416 Radiculopathy, lumbar region: Secondary | ICD-10-CM

## 2018-12-25 MED ORDER — METHYLPREDNISOLONE ACETATE 40 MG/ML INJ SUSP (RADIOLOG
120.0000 mg | Freq: Once | INTRAMUSCULAR | Status: AC
  Administered 2018-12-25: 120 mg via EPIDURAL

## 2018-12-25 MED ORDER — IOPAMIDOL (ISOVUE-M 200) INJECTION 41%
1.0000 mL | Freq: Once | INTRAMUSCULAR | Status: AC
  Administered 2018-12-25: 1 mL via EPIDURAL

## 2018-12-25 NOTE — Discharge Instructions (Signed)

## 2019-01-01 ENCOUNTER — Telehealth: Payer: Self-pay | Admitting: *Deleted

## 2019-01-01 ENCOUNTER — Ambulatory Visit (INDEPENDENT_AMBULATORY_CARE_PROVIDER_SITE_OTHER): Payer: Medicare HMO | Admitting: *Deleted

## 2019-01-01 DIAGNOSIS — J309 Allergic rhinitis, unspecified: Secondary | ICD-10-CM | POA: Diagnosis not present

## 2019-01-01 NOTE — Telephone Encounter (Signed)
Left message on machine for patient to see if she is interested in Prolia injections. CRM

## 2019-01-05 ENCOUNTER — Other Ambulatory Visit: Payer: Self-pay | Admitting: Adult Health

## 2019-01-08 ENCOUNTER — Ambulatory Visit (INDEPENDENT_AMBULATORY_CARE_PROVIDER_SITE_OTHER): Payer: Medicare HMO | Admitting: *Deleted

## 2019-01-08 DIAGNOSIS — J309 Allergic rhinitis, unspecified: Secondary | ICD-10-CM

## 2019-01-10 ENCOUNTER — Other Ambulatory Visit: Payer: Self-pay | Admitting: Adult Health

## 2019-01-15 NOTE — Telephone Encounter (Signed)
2nd attempt. Left message on machine for patient to see if she is interested in Prolia injections.

## 2019-01-17 ENCOUNTER — Ambulatory Visit: Payer: 59 | Admitting: Adult Health

## 2019-01-22 ENCOUNTER — Ambulatory Visit (INDEPENDENT_AMBULATORY_CARE_PROVIDER_SITE_OTHER): Payer: Medicare HMO

## 2019-01-22 DIAGNOSIS — J309 Allergic rhinitis, unspecified: Secondary | ICD-10-CM | POA: Diagnosis not present

## 2019-01-25 ENCOUNTER — Encounter: Payer: Self-pay | Admitting: Adult Health

## 2019-01-25 ENCOUNTER — Ambulatory Visit (INDEPENDENT_AMBULATORY_CARE_PROVIDER_SITE_OTHER): Payer: 59 | Admitting: Adult Health

## 2019-01-25 VITALS — BP 124/60 | Temp 98.4°F | Wt 128.0 lb

## 2019-01-25 DIAGNOSIS — E119 Type 2 diabetes mellitus without complications: Secondary | ICD-10-CM

## 2019-01-25 LAB — POCT GLYCOSYLATED HEMOGLOBIN (HGB A1C): HbA1c, POC (controlled diabetic range): 6.5 % (ref 0.0–7.0)

## 2019-01-25 NOTE — Telephone Encounter (Signed)
3rd attempt Left message on machine

## 2019-01-25 NOTE — Progress Notes (Signed)
Subjective:    Patient ID: Kimberly Lam, female    DOB: 1946/04/12, 73 y.o.   MRN: [id number]  HPI  72 year old female who  has a past medical history of Acid reflux, Allergic rhinitis, Anemia, Asthma, Bilateral sciatica, Chronic bilateral low back pain, Colon polyps, Diverticulosis, DM type 2 (diabetes mellitus, type 2) ([place]), Environmental allergies, GERD (gastroesophageal reflux disease), Hyperlipidemia, Low back pain, and Migraine.  She presents to the office today for three month follow up regarding DM  She is currently prescribed Metformin XR 1000 mg BID. Her last A1c was 6.8. She has been monitoring her blood sugars at home and has been having readings consistently in the 120's.   Denies any acute complaints.   Review of Systems See HPI   Past Medical History:  Diagnosis Date  . Acid reflux   . Allergic rhinitis   . Anemia   . Asthma   . Bilateral sciatica   . Chronic bilateral low back pain   . Colon polyps   . Diverticulosis   . DM type 2 (diabetes mellitus, type 2) ([place])   . Environmental allergies    flowers, pollen, trees  . GERD (gastroesophageal reflux disease)   . Hyperlipidemia   . Low back pain   . Migraine     Social History   Socioeconomic History  . Marital status: Married    Spouse name: Not on file  . Number of children: 2  . Years of education: 82  . Highest education level: Not on file  Occupational History  . Occupation: Retired  Scientific laboratory technician  . Financial resource strain: Not on file  . Food insecurity:    Worry: Not on file    Inability: Not on file  . Transportation needs:    Medical: Not on file    Non-medical: Not on file  Tobacco Use  . Smoking status: Never Smoker  . Smokeless tobacco: Never Used  Substance and Sexual Activity  . Alcohol use: No    Alcohol/week: 0.0 standard drinks  . Drug use: No  . Sexual activity: Not on file  Lifestyle  . Physical activity:    Days per week: Not on file    Minutes per  session: Not on file  . Stress: Not on file  Relationships  . Social connections:    Talks on phone: Not on file    Gets together: Not on file    Attends religious service: Not on file    Active member of club or organization: Not on file    Attends meetings of clubs or organizations: Not on file    Relationship status: Not on file  . Intimate partner violence:    Fear of current or ex partner: Not on file    Emotionally abused: Not on file    Physically abused: Not on file    Forced sexual activity: Not on file  Other Topics Concern  . Not on file  Social History Narrative   ** Merged History Encounter **       ** Data from: 11/23/15 Enc Dept: [facility]   Married 1 son one daughter, relocated to [place] after living in [place] for many years. 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       ** Data from: 11/25/15 Enc Dept: [facility]   Lives at home with her husband and brother. Right-handed. No caffeine use.    Past Surgical History:  Procedure Laterality Date  . CATARACT EXTRACTION, BILATERAL Bilateral 2012  . CESAREAN SECTION     x 2  . DILATION AND CURETTAGE OF UTERUS    . NASAL SINUS SURGERY    . TUBAL LIGATION      Family History  Problem Relation Age of Onset  . Asthma Father   . Emphysema Father   . Alcohol abuse Father   . Diabetes Mellitus II Brother        x 2  . Hyperlipidemia Brother   . Hypertension Brother   . Pancreatic cancer Brother   . Heart disease Maternal Grandmother        hardening of the arteries  . Heart murmur Daughter   . Prostate cancer Brother   . Glaucoma Brother   . Breast cancer Neg Hx     Allergies  Allergen Reactions  . Peanut-Containing Drug Products Anaphylaxis  . Milk-Related Compounds Nausea And Vomiting    Milk and cheese  . Actos [Pioglitazone] Nausea Only  . Soy Allergy Nausea Only    Pt states had allergy testing and was told allergic to soy-causes nausea but no  other reactions  . Vicodin [Hydrocodone-Acetaminophen] Nausea Only    Current Outpatient Medications on File Prior to Visit  Medication Sig Dispense Refill  . ACCU-CHEK FASTCLIX LANCETS MISC USE TO TEST BLOOD GLUCOSE 3-4 TIMES DAILY 360 each 3  . alendronate (FOSAMAX) 70 MG tablet TAKE 1 TABLET BY MOUTH 1  TIME A WEEK. TAKE WITH A  FULL GLASS OF WATER ON AN  EMPTY STOMACH. 12 tablet 3  . Ascorbic Acid (VITAMIN C) 1000 MG tablet Take 1,000 mg by mouth as needed.    . Azelastine HCl 0.15 % SOLN Place 2 sprays into both nostrils daily. 90 mL 2  . Barberry-Oreg Grape-Goldenseal (BERBERINE COMPLEX PO) Take by mouth daily.    . Blood Glucose Monitoring Suppl (ACCU-CHEK AVIVA PLUS) w/Device KIT     . cholecalciferol (VITAMIN D) 1000 units tablet Take 1,000 Units by mouth daily.    Marland Kitchen CINNAMON PO Take 1 tablet by mouth as needed.    . Cranberry 450 MG CAPS Take 1 capsule by mouth as needed.    . Cyanocobalamin (VITAMIN B-12 CR) 1500 MCG TBCR Take 1 tablet by mouth as needed.    . Echinacea-Goldenseal (ECHINACEA COMB/GOLDEN SEAL PO) Take 1 tablet by mouth as needed. Reported on 12/16/2015    . fluticasone (FLONASE) 50 MCG/ACT nasal spray Place 1 spray into both nostrils 2 (two) times daily. 48 g 1  . gabapentin (NEURONTIN) 300 MG capsule Take 1 capsule (300 mg total) by mouth 4 (four) times daily. 360 capsule 4  . glucose blood (ACCU-CHEK GUIDE) test strip USE TO TEST BLOOD GLUCOSE THREE TIMES DAILY 300 each 3  . Green Tea, Camillia sinensis, (GREEN TEA PO) Take by mouth as needed. Reported on 12/16/2015    . Lactobacillus (ACIDOPHILUS PROBIOTIC PO) Take 1 tablet by mouth daily.    Marland Kitchen levocetirizine (XYZAL) 5 MG tablet Take 1 tablet (5 mg total) by mouth every evening. 90 tablet 2  . Melatonin 5 MG TABS Take 1 tablet by mouth as needed. Reported on 12/16/2015    . meloxicam (MOBIC) 15 MG tablet TAKE 1 TABLET BY MOUTH  DAILY 90 tablet 1  . metFORMIN (GLUCOPHAGE-XR) 500 MG 24 hr tablet TAKE 2 TABLETS BY  MOUTH TWICE A DAY 360 tablet 0  . Mometasone Furo-Formoterol Fum (DULERA IN) Inhale into the lungs.    Marland Kitchen  montelukast (SINGULAIR) 10 MG tablet Take 1 tablet (10 mg total) by mouth at bedtime. 90 tablet 2  . Omega-3 Fatty Acids (OMEGA 3 PO) Take 1 tablet by mouth as needed.    Marland Kitchen omeprazole (PRILOSEC) 40 MG capsule Take 40 mg by mouth as needed.     . ondansetron (ZOFRAN) 4 MG tablet Take 1 tablet (4 mg total) by mouth every 6 (six) hours. 12 tablet 0  . Polyethyl Glycol-Propyl Glycol (SYSTANE FREE OP) Apply 1 drop to eye daily.    . ramipril (ALTACE) 2.5 MG capsule Take 1 capsule (2.5 mg total) by mouth daily. 90 capsule 3  . simvastatin (ZOCOR) 20 MG tablet TAKE 1 TABLET BY MOUTH  DAILY 90 tablet 3  . Spacer/Aero-Holding Chambers (AEROCHAMBER PLUS) inhaler Use as instructed with MDI 1 each 2  . Stinging Nettle 2 % POWD Take 1 tablet by mouth as needed. Reported on 12/16/2015    . traZODone (DESYREL) 50 MG tablet TAKE 1/2 TO 1 TABLET BY MOUTH AT BEDTIME AS NEEDED FOR SLEEP 90 tablet 1  . TURMERIC PO Take 800 mg by mouth as needed. Reported on 12/16/2015     No current facility-administered medications on file prior to visit.     BP 124/60   Temp 98.4 F (36.9 C)   Wt 128 lb (58.1 kg)   BMI 21.97 kg/m       Objective:   Physical Exam Vitals signs and nursing note reviewed.  Constitutional:      Appearance: Normal appearance.  Cardiovascular:     Rate and Rhythm: Normal rate and regular rhythm.     Pulses: Normal pulses.     Heart sounds: Normal heart sounds.  Pulmonary:     Effort: Pulmonary effort is normal.     Breath sounds: Normal breath sounds.  Skin:    General: Skin is warm and dry.     Capillary Refill: Capillary refill takes less than 2 seconds.  Neurological:     General: No focal deficit present.     Mental Status: She is alert and oriented to person, place, and time.  Psychiatric:        Mood and Affect: Mood normal.        Behavior: Behavior normal.         Thought Content: Thought content normal.        Judgment: Judgment normal.       Assessment & Plan:  1. Diabetes mellitus without complication ([place])  - POCT A1C- 6.5  - Follow up in 6 months or sooner if needed  [name], NP

## 2019-01-30 NOTE — Telephone Encounter (Signed)
Ok to stop calling. She mentioned to me the last time she was in that she was not interested in this time

## 2019-01-30 NOTE — Telephone Encounter (Signed)
3 attempts at calling the patient to see if she is interested in Prolia injections.  Patient has not responded.  FYI.

## 2019-02-06 ENCOUNTER — Encounter: Payer: Self-pay | Admitting: Adult Health

## 2019-02-06 ENCOUNTER — Ambulatory Visit (INDEPENDENT_AMBULATORY_CARE_PROVIDER_SITE_OTHER): Payer: Medicare HMO | Admitting: *Deleted

## 2019-02-06 DIAGNOSIS — J309 Allergic rhinitis, unspecified: Secondary | ICD-10-CM | POA: Diagnosis not present

## 2019-02-18 ENCOUNTER — Ambulatory Visit (INDEPENDENT_AMBULATORY_CARE_PROVIDER_SITE_OTHER): Payer: Medicare HMO

## 2019-02-18 DIAGNOSIS — J309 Allergic rhinitis, unspecified: Secondary | ICD-10-CM | POA: Diagnosis not present

## 2019-02-19 NOTE — Telephone Encounter (Signed)
noted 

## 2019-02-22 ENCOUNTER — Other Ambulatory Visit: Payer: Self-pay

## 2019-02-22 DIAGNOSIS — J309 Allergic rhinitis, unspecified: Secondary | ICD-10-CM

## 2019-02-22 MED ORDER — MONTELUKAST SODIUM 10 MG PO TABS: 10.0000 mg | ORAL_TABLET | Freq: Every day | ORAL | 0 refills | Status: DC

## 2019-02-22 MED ORDER — FLUTICASONE PROPIONATE 50 MCG/ACT NA SUSP: 1.0000 | Freq: Two times a day (BID) | NASAL | 0 refills | Status: DC

## 2019-02-22 MED ORDER — LEVOCETIRIZINE DIHYDROCHLORIDE 5 MG PO TABS: 5.0000 mg | ORAL_TABLET | Freq: Every evening | ORAL | 0 refills | Status: DC

## 2019-02-22 NOTE — Telephone Encounter (Signed)
Lov:  08/24/18   Rtc: 6 months Pend: no pending appointment, courtesy one time refill.  Sent mychart message to schedule appointment with Dr. Verlin Fester

## 2019-02-25 ENCOUNTER — Other Ambulatory Visit: Payer: Self-pay | Admitting: *Deleted

## 2019-02-25 MED ORDER — AZELASTINE HCL 0.1 % NA SOLN: 2.0000 | Freq: Two times a day (BID) | NASAL | 12 refills | Status: DC

## 2019-02-25 NOTE — Progress Notes (Signed)
EXP 02/26/20

## 2019-02-26 ENCOUNTER — Other Ambulatory Visit: Payer: Self-pay | Admitting: *Deleted

## 2019-02-26 ENCOUNTER — Ambulatory Visit (INDEPENDENT_AMBULATORY_CARE_PROVIDER_SITE_OTHER): Payer: Medicare HMO | Admitting: *Deleted

## 2019-02-26 DIAGNOSIS — J309 Allergic rhinitis, unspecified: Secondary | ICD-10-CM

## 2019-02-26 DIAGNOSIS — J3089 Other allergic rhinitis: Secondary | ICD-10-CM | POA: Diagnosis not present

## 2019-02-27 ENCOUNTER — Other Ambulatory Visit: Payer: Self-pay | Admitting: Family Medicine

## 2019-02-27 DIAGNOSIS — J301 Allergic rhinitis due to pollen: Secondary | ICD-10-CM | POA: Diagnosis not present

## 2019-02-27 MED ORDER — METFORMIN HCL ER 500 MG PO TB24: 1000.0000 mg | ORAL_TABLET | Freq: Two times a day (BID) | ORAL | 0 refills | Status: DC

## 2019-02-27 MED ORDER — BD SWAB SINGLE USE REGULAR PADS: MEDICATED_PAD | 3 refills | Status: DC

## 2019-02-27 MED ORDER — ACCU-CHEK GUIDE CONTROL VI LIQD: 1.0000 | Freq: Once | 0 refills | Status: AC

## 2019-02-27 MED ORDER — ACCU-CHEK FASTCLIX LANCETS MISC: 3 refills | Status: DC

## 2019-02-27 MED ORDER — SIMVASTATIN 20 MG PO TABS: 20.0000 mg | ORAL_TABLET | Freq: Every day | ORAL | 1 refills | Status: DC

## 2019-02-27 MED ORDER — GLUCOSE BLOOD VI STRP: ORAL_STRIP | 3 refills | Status: DC

## 2019-02-27 MED ORDER — MELOXICAM 15 MG PO TABS: 15.0000 mg | ORAL_TABLET | Freq: Every day | ORAL | 1 refills | Status: DC

## 2019-02-27 MED ORDER — ALENDRONATE SODIUM 70 MG PO TABS: ORAL_TABLET | ORAL | 1 refills | Status: DC

## 2019-02-27 MED ORDER — ACCU-CHEK FASTCLIX LANCET KIT: 1.0000 | PACK | Freq: Once | 0 refills | Status: AC

## 2019-02-27 MED ORDER — RAMIPRIL 2.5 MG PO CAPS: 2.5000 mg | ORAL_CAPSULE | Freq: Every day | ORAL | 1 refills | Status: DC

## 2019-02-27 NOTE — Telephone Encounter (Signed)
Sent to the pharmacy by e-scribe. 

## 2019-02-28 DIAGNOSIS — J3089 Other allergic rhinitis: Secondary | ICD-10-CM | POA: Diagnosis not present

## 2019-03-05 ENCOUNTER — Ambulatory Visit (INDEPENDENT_AMBULATORY_CARE_PROVIDER_SITE_OTHER): Payer: Medicare HMO | Admitting: *Deleted

## 2019-03-05 DIAGNOSIS — J309 Allergic rhinitis, unspecified: Secondary | ICD-10-CM | POA: Diagnosis not present

## 2019-03-12 ENCOUNTER — Other Ambulatory Visit: Payer: Self-pay

## 2019-03-12 NOTE — Patient Outreach (Signed)
[place] [facility]) Care Management  03/12/2019  Kimberly Lam June 04, 1946 [id number]   Telephone Screen  Referral Date: 03/12/2019 Referral Source: [facility] Referral Reason: " member wants help with new med, hearing aides and glasses she can't afford, looking for dentist" Insurance: [employer]   Outreach attempt # 1 to patient. Spoke with patient and screening completed. Patient resides in her home along with her spouse and brother. She voices that she is independent with ADLs/IADLs. She denies any recent falls. She voices that either her spouse or herself drives to MD appts. Patient inquiring about assistance obtaining dentist. RN CM  directed patient to contact [facility] customer service to obtain list of in network providers as she voices she has dental coverage. She reports that she will need a lot of dental work and is looking for dental clinics or community assistance with dental cost. Patient also requesting resource info on helping her to obtain hearing aides and glasses as insurance does not pay much for these items. Patient has not looked into seeing what her out of pocket cost would be and encouraged her to speak with insurance provider regarding the matter.    Conditions: Per chart review, patient has PMH of DM ( A1C 6.5),GERD, Anemia, HLD and asthma. Patient requesting further education and support in managing her Diabetes. She states that she needs more information on what foods she can and not eat. She reports that she used to have a nurse call her every so often to check on her through [facility] and wants similar service again to assist her.    Medications: Patient voices she is taking about 11 meds She states that she wants to be set up with bubble pack for meds but her local pharmacy does not provide that service. Patient inquiring about assistance locating pharmacy near her that provides services. Patient states she is currently filling her med planner  daily for her meds and has a reminder set on her phone to alarm her of when to take meds. She denies any issues paying for any of her meds at this time.    Appointments: Patient followed by PCP and sees regularly.  Consent: [facility] services reviewed and discussed with patient. Verbal consent for services given.    Plan: RN CM will send [place] referral for further disease mgmt education and support. RN CM will send [place] [facility] referral for possible resources/assistance with dental clinics, hearing aides and glasses. RN CM will send [facility] pharmacy referral for possible med mgmt assistance.    Enzo Montgomery, RN,BSN,CCM [place] Management Telephonic Care Management Coordinator Direct Phone: [phone] Toll Free: [phone] Fax: [phone]

## 2019-03-13 ENCOUNTER — Other Ambulatory Visit: Payer: Self-pay

## 2019-03-14 ENCOUNTER — Telehealth: Payer: Self-pay | Admitting: Pharmacist

## 2019-03-14 ENCOUNTER — Other Ambulatory Visit: Payer: Self-pay

## 2019-03-14 NOTE — Patient Outreach (Signed)
[facility]) Care Management  03/14/2019  Roby Spalla [place] 1945/12/30 [id number]   Successful outreach regarding social work referral for dental, hearing aide, and vision resources.  Patient reports needing a significant amount of dental work. She has contacted her insurance provider regarding coverage and is not able to afford out-of-pocket expense.  Per patient, she spoke with a Customer Service Rep at [facility] approximately two weeks ago. Patient reports that she was supposed to be contacted by a dental provider that accepts payment plans but she has not been contacted and could not remember name of provider.  BSW encouraged her to call [employer] again.  BSW provided her with contact information for [facility] as they have sliding scale fees and offer payment plans. BSW also provided her with contact information for [facility] but informed her that the clinic is currently closed until further notice due to COVID-19. Patient reports being unable to afford copay for hearing aides and glasses.  BSW educated her about services through [place] Division of Services for the Deaf and Hard of Hearing.  Patient must first have a hearing test completed to pursue financial assistance through the division.  BSW provided her with contact information for [facility] which is one of the approved vendors with the division.  Patient agreed to contact them to schedule test.  BSW is unaware of resources in [facility] that assist with purchase of eye glasses.  BSW will reach out to social work colleagues for suggestions.   BSW will follow up with patient next week regarding resources discussed.   Ronn Melena, BSW Social Worker [phone]

## 2019-03-14 NOTE — Patient Outreach (Signed)
[facility]) Care Management  [place]   03/14/2019  Itzia Cunliffe [place] [birth year] [id number]  Subjective: Patient was referred for medication adherence/Pill pack by [facility] Telephonic nurse. HIPAA identifiers were obtained. Patient is a 73 year old female with multiple medical conditions including but not limited to:  Type 2 diabetes, GERD, Perennial and seasonal allergies, Moderate to persistent asthma and diabetic peripheral neuropathy.  Objective:   Encounter Medications: Outpatient Encounter Medications as of 03/14/2019  Medication Sig Note  . Accu-Chek FastClix Lancets MISC USE TO TEST BLOOD GLUCOSE 3-4 TIMES DAILY   . Alcohol Swabs (B-D SINGLE USE SWABS REGULAR) PADS USE TO TEST BLOOD GLUCOSE FOUR TIMES DAILY   . alendronate (FOSAMAX) 70 MG tablet TAKE 1 TABLET BY MOUTH 1  TIME A WEEK. TAKE WITH A  FULL GLASS OF WATER ON AN  EMPTY STOMACH.   Marland Kitchen Ascorbic Acid (VITAMIN C) 1000 MG tablet Take 1,000 mg by mouth as needed.   Marland Kitchen azelastine (ASTELIN) 0.1 % nasal spray Place 2 sprays into both nostrils 2 (two) times daily. Use in each nostril as directed   . Barberry-Oreg Grape-Goldenseal (BERBERINE COMPLEX PO) Take by mouth daily.   . Blood Glucose Monitoring Suppl (ACCU-CHEK AVIVA PLUS) w/Device KIT    . cholecalciferol (VITAMIN D) 1000 units tablet Take 1,000 Units by mouth daily.   Marland Kitchen CINNAMON PO Take 1 tablet by mouth as needed.   . Cranberry 450 MG CAPS Take 1 capsule by mouth as needed.   . Cyanocobalamin (VITAMIN B-12 CR) 1500 MCG TBCR Take 1 tablet by mouth as needed.   . Echinacea-Goldenseal (ECHINACEA COMB/GOLDEN SEAL PO) Take 1 tablet by mouth as needed. Reported on 12/16/2015 03/14/2019: PRN   . fluticasone (FLONASE) 50 MCG/ACT nasal spray Place 1 spray into both nostrils 2 (two) times daily.   Marland Kitchen gabapentin (NEURONTIN) 300 MG capsule Take 1 capsule (300 mg total) by mouth 4 (four) times daily.   Marland Kitchen glucose blood (ACCU-CHEK GUIDE) test strip USE TO TEST BLOOD  GLUCOSE FOUR TIMES DAILY   . Green Tea, Camillia sinensis, (GREEN TEA PO) Take by mouth as needed. Reported on 12/16/2015 03/14/2019: PRN  . Lactobacillus (ACIDOPHILUS PROBIOTIC PO) Take 1 tablet by mouth daily.   Marland Kitchen levocetirizine (XYZAL) 5 MG tablet Take 1 tablet (5 mg total) by mouth every evening.   . Melatonin 5 MG TABS Take 1 tablet by mouth as needed. Reported on 12/16/2015   . meloxicam (MOBIC) 15 MG tablet Take 1 tablet (15 mg total) by mouth daily.   . metFORMIN (GLUCOPHAGE-XR) 500 MG 24 hr tablet Take 2 tablets (1,000 mg total) by mouth 2 (two) times daily.   . montelukast (SINGULAIR) 10 MG tablet Take 1 tablet (10 mg total) by mouth at bedtime.   . Omega-3 Fatty Acids (OMEGA 3 PO) Take 1 tablet by mouth as needed.   Marland Kitchen omeprazole (PRILOSEC) 40 MG capsule Take 40 mg by mouth as needed.    . ondansetron (ZOFRAN) 4 MG tablet Take 1 tablet (4 mg total) by mouth every 6 (six) hours.   Vladimir Faster Glycol-Propyl Glycol (SYSTANE FREE OP) Apply 1 drop to eye daily.   . ramipril (ALTACE) 2.5 MG capsule Take 1 capsule (2.5 mg total) by mouth daily.   . simvastatin (ZOCOR) 20 MG tablet Take 1 tablet (20 mg total) by mouth daily.   Marland Kitchen Spacer/Aero-Holding Chambers (AEROCHAMBER PLUS) inhaler Use as instructed with MDI   . Stinging Nettle 2 % POWD Take 1 tablet  by mouth as needed. Reported on 12/16/2015   . traZODone (DESYREL) 50 MG tablet TAKE 1/2 TO 1 TABLET BY MOUTH AT BEDTIME AS NEEDED FOR SLEEP   . TURMERIC PO Take 800 mg by mouth as needed. Reported on 12/16/2015 03/14/2019: PRN   . [DISCONTINUED] Mometasone Furo-Formoterol Fum (DULERA IN) Inhale into the lungs.    No facility-administered encounter medications on file as of 03/14/2019.    Assessment:  Drugs sorted by system:  Neurologic/Psychologic: Gabapentin, Trazodone,   Cardiovascular: Ramipril, Simvastatin,   Pulmonary/Allergy: Azelastine, Fluticasone, Levocetirizine, Montelukast,   Gastrointestinal: Omeprazole, Ondansetron,    Endocrine: Alendronate, Metformin XR,   Pain: Meloxicam,   Vitamins/Minerals: Ascorbic Acid, Berbene complex, Cholecalciferol, Cinnamon, Cranberry, Cyanocobalamin, Echinacea-Golden Seal, Green Tea, Lactobacillus, Omega 3 Fatty Acid, Melatonin, Stinging Nettle, Turmeric,   Miscellaneous: Systane  Medication Review Findings: Patient said he was interested in pill packing. However, after further review, the patient gets her generic medications from [place] at no cost.   Patient decided she was no longer interested in pill packing after discovering that she would have to pay for her medications if they are put in pill packing. OTC products would not go in the pack.  Plan: Close patient's pharmacy case. Patient was given my contact information to reach out to me at a later date if she decided to switch her pharmacy for pill packing.  Elayne Guerin, PharmD, [place] Clinical Pharmacist [phone]

## 2019-03-18 NOTE — Progress Notes (Signed)
Exp 03/17/20 

## 2019-03-19 ENCOUNTER — Other Ambulatory Visit: Payer: Self-pay

## 2019-03-19 ENCOUNTER — Ambulatory Visit (INDEPENDENT_AMBULATORY_CARE_PROVIDER_SITE_OTHER): Payer: Medicare HMO | Admitting: *Deleted

## 2019-03-19 DIAGNOSIS — J309 Allergic rhinitis, unspecified: Secondary | ICD-10-CM

## 2019-03-19 NOTE — Patient Outreach (Signed)
[facility]) Care Management  03/19/2019  Kimberly Lam 1946/03/07 [id number]   Follow up call to patient regarding resources for financial assistance with purchase of hearing aid and eye glasses. Patient has not yet contacted [facility] to schedule hearing test.  BSW reminded her that hearing test must occur to request financial assistance for purchase of hearing aid through [place] Division of Services for the Deaf and Hard of Hearing.  Patient reported that she has not contacted them because "a lot of places are closed right now" due to COVID-19.   BSW encouraged her to call regarding current hours and schedule appointment for several months out if necessary.   BSW has reached out to [employer] regarding referral process for assistance with purchase of eye glasses.  BSW is still awaiting response.   Will follow up with patient when response is received.   Ronn Melena, BSW Social Worker [phone]

## 2019-03-20 ENCOUNTER — Ambulatory Visit (INDEPENDENT_AMBULATORY_CARE_PROVIDER_SITE_OTHER): Payer: Medicare HMO | Admitting: Allergy

## 2019-03-20 ENCOUNTER — Encounter: Payer: Self-pay | Admitting: Allergy

## 2019-03-20 ENCOUNTER — Other Ambulatory Visit: Payer: Self-pay

## 2019-03-20 ENCOUNTER — Other Ambulatory Visit: Payer: Self-pay | Admitting: *Deleted

## 2019-03-20 DIAGNOSIS — J3089 Other allergic rhinitis: Secondary | ICD-10-CM

## 2019-03-20 DIAGNOSIS — T7800XD Anaphylactic reaction due to unspecified food, subsequent encounter: Secondary | ICD-10-CM | POA: Diagnosis not present

## 2019-03-20 DIAGNOSIS — J309 Allergic rhinitis, unspecified: Secondary | ICD-10-CM

## 2019-03-20 DIAGNOSIS — J4540 Moderate persistent asthma, uncomplicated: Secondary | ICD-10-CM

## 2019-03-20 MED ORDER — MONTELUKAST SODIUM 10 MG PO TABS: 10.0000 mg | ORAL_TABLET | Freq: Every day | ORAL | 0 refills | Status: DC

## 2019-03-20 MED ORDER — AZELASTINE HCL 0.1 % NA SOLN: 2.0000 | Freq: Two times a day (BID) | NASAL | 1 refills | Status: DC

## 2019-03-20 MED ORDER — BUDESONIDE-FORMOTEROL FUMARATE 80-4.5 MCG/ACT IN AERO: 2.0000 | INHALATION_SPRAY | Freq: Two times a day (BID) | RESPIRATORY_TRACT | 0 refills | Status: DC

## 2019-03-20 MED ORDER — LEVOCETIRIZINE DIHYDROCHLORIDE 5 MG PO TABS: 5.0000 mg | ORAL_TABLET | Freq: Every evening | ORAL | 0 refills | Status: DC

## 2019-03-20 MED ORDER — EPINEPHRINE 0.3 MG/0.3ML IJ SOAJ: 0.3000 mg | Freq: Once | INTRAMUSCULAR | 1 refills | Status: AC

## 2019-03-20 MED ORDER — FLUTICASONE PROPIONATE 50 MCG/ACT NA SUSP: 1.0000 | Freq: Two times a day (BID) | NASAL | 1 refills | Status: DC

## 2019-03-20 MED ORDER — AZELASTINE HCL 0.1 % NA SOLN: 2.0000 | Freq: Two times a day (BID) | NASAL | 0 refills | Status: DC

## 2019-03-20 MED ORDER — BUDESONIDE-FORMOTEROL FUMARATE 80-4.5 MCG/ACT IN AERO: 2.0000 | INHALATION_SPRAY | Freq: Two times a day (BID) | RESPIRATORY_TRACT | 1 refills | Status: DC

## 2019-03-20 MED ORDER — LEVOCETIRIZINE DIHYDROCHLORIDE 5 MG PO TABS: 5.0000 mg | ORAL_TABLET | Freq: Every evening | ORAL | 1 refills | Status: DC

## 2019-03-20 MED ORDER — MONTELUKAST SODIUM 10 MG PO TABS: 10.0000 mg | ORAL_TABLET | Freq: Every day | ORAL | 1 refills | Status: DC

## 2019-03-20 MED ORDER — FLUTICASONE PROPIONATE 50 MCG/ACT NA SUSP: 1.0000 | Freq: Two times a day (BID) | NASAL | 0 refills | Status: DC

## 2019-03-20 NOTE — Progress Notes (Signed)
RE: Kimberly Lam MRN: [id number] DOB: 1946/06/16 Date of Telemedicine Visit: 03/20/2019  Referring provider: [name], NP Primary care provider: [name], NP  Chief Complaint: Allergic Reaction (Asthma inhalers)   Telemedicine Follow Up Visit via Telephone: I connected with [name] for a follow up on 03/21/19 by telephone and verified that I am speaking with the correct person using two identifiers.   I discussed the limitations, risks, security and privacy concerns of performing an evaluation and management service by telephone and the availability of in person appointments. I also discussed with the patient that there may be a patient responsible charge related to this service. The patient expressed understanding and agreed to proceed.  Patient is at home accompanied. Provider is at the office.  Visit start time: 3:07PM Visit end time: 3:31PM Insurance consent/check in by: Kimberly Lam Medical consent and medical assistant/nurse: Kimberly Lam  History of Present Illness: She is a 73 y.o. female, who is being followed for asthma, allergic rhinoconjunctivitis, food allergies. Her previous allergy office visit was on 08/24/2018 with Kimberly Lam, [place].   1. Perennial and seasonal allergic rhinoconjunctivitis Currently on allergy injections every 1-2 weeks for new vial, montelukast, allegra-D/zyrtec-D. The plain antihistamines do not seem to work. She does not have hypertension.  Currently on Flonase 1 spray BID and azelastine BID with good benefit. No epistaxis.  Using saline spray as needed.    2. Moderate persistent asthma, uncomplicated Patient has issues with her breathing at night. Triggers include: heat and too much exertion. She used Dulera samples but couldn't afford to refill it. It was over $100. [facility] did help her breathing. She has not been using rescue inhaler.   She is having some coughing and/or wheezing if the drainage going down the back  of throat.  3. Anaphylactic shock due to food, subsequent encounter Currently avoiding peanuts, cow's milk, soybean. No accidental ingestion.  Couldn't afford to refill the Epipen.   Assessment and Plan:  [name] is a 73 y.o. female with: Moderate persistent asthma, uncomplicated Felt better with [facility] but could not afford to purchase after samples ran out. Having some issues with her breathing at night. . Daily controller medication(s): start Symbicort 80 2 puffs twice a day and rinse mouth afterwards.  o Let us know if the cost is too much but according to your insurance this should be cheaper than Dulera.  . Prior to physical activity: May use albuterol rescue inhaler 2 puffs 5 to 15 minutes prior to strenuous physical activities. Marland Kitchen Rescue medications: May use albuterol rescue inhaler 2 puffs or nebulizer every 4 to 6 hours as needed for shortness of breath, chest tightness, coughing, and wheezing. Monitor frequency of use.   Perennial and seasonal allergic rhinoconjunctivitis Stable.  Continue environmental control measures.  Discussed that using the antihistamines with the decongestant for long term is not recommended as it can cause hypertension and rebound effects.  May use over the counter antihistamines such as Zyrtec (cetirizine), Claritin (loratadine), Allegra (fexofenadine), or Xyzal (levocetirizine) daily as needed.  Continue Flonase 1 spray twice a day and azelastine 1 spray twice a day.  Nasal saline spray (i.e., Simply Saline) or nasal saline lavage (i.e., NeilMed) is recommended as needed and prior to medicated nasal sprays.  Continue montelukast 65m daily.   Anaphylactic shock due to adverse food reaction No accidental ingestions. Patient did not pick up Epipen due to cost.   Continue to avoid peanuts, cow's milk, soybean  I have prescribed epinephrine  injectable and demonstrated proper use. For mild symptoms you can take over the counter antihistamines such as  Benadryl and monitor symptoms closely. If symptoms worsen or if you have severe symptoms including breathing issues, throat closure, significant swelling, whole body hives, severe diarrhea and vomiting, lightheadedness then inject epinephrine and seek immediate medical care afterwards.  Return in about 3 months (around 06/19/2019).  Prescriptions were sent to mail order pharmacy for 3 month supply and local pharmacy for 1 month supply.   Diagnostics: None.  Medication List:  Current Outpatient Medications  Medication Sig Dispense Refill  . Accu-Chek FastClix Lancets MISC USE TO TEST BLOOD GLUCOSE 3-4 TIMES DAILY 360 each 3  . Alcohol Swabs (B-D SINGLE USE SWABS REGULAR) PADS USE TO TEST BLOOD GLUCOSE FOUR TIMES DAILY 200 each 3  . alendronate (FOSAMAX) 70 MG tablet TAKE 1 TABLET BY MOUTH 1  TIME A WEEK. TAKE WITH A  FULL GLASS OF WATER ON AN  EMPTY STOMACH. 12 tablet 1  . Ascorbic Acid (VITAMIN C) 1000 MG tablet Take 1,000 mg by mouth as needed.    Jolyne Loa Grape-Goldenseal (BERBERINE COMPLEX PO) Take by mouth daily.    . Blood Glucose Monitoring Suppl (ACCU-CHEK AVIVA PLUS) w/Device KIT     . cholecalciferol (VITAMIN D) 1000 units tablet Take 1,000 Units by mouth daily.    Marland Kitchen CINNAMON PO Take 1 tablet by mouth as needed.    . Cranberry 450 MG CAPS Take 1 capsule by mouth as needed.    . Cyanocobalamin (VITAMIN B-12 CR) 1500 MCG TBCR Take 1 tablet by mouth as needed.    . Echinacea-Goldenseal (ECHINACEA COMB/GOLDEN SEAL PO) Take 1 tablet by mouth as needed. Reported on 12/16/2015    . gabapentin (NEURONTIN) 300 MG capsule Take 1 capsule (300 mg total) by mouth 4 (four) times daily. 360 capsule 4  . glucose blood (ACCU-CHEK GUIDE) test strip USE TO TEST BLOOD GLUCOSE FOUR TIMES DAILY 300 each 3  . Green Tea, Camillia sinensis, (GREEN TEA PO) Take by mouth as needed. Reported on 12/16/2015    . Lactobacillus (ACIDOPHILUS PROBIOTIC PO) Take 1 tablet by mouth daily.    . metFORMIN  (GLUCOPHAGE-XR) 500 MG 24 hr tablet Take 2 tablets (1,000 mg total) by mouth 2 (two) times daily. 360 tablet 0  . Omega-3 Fatty Acids (OMEGA 3 PO) Take 1 tablet by mouth as needed.    Marland Kitchen omeprazole (PRILOSEC) 40 MG capsule Take 40 mg by mouth as needed.     Vladimir Faster Glycol-Propyl Glycol (SYSTANE FREE OP) Apply 1 drop to eye daily.    . ramipril (ALTACE) 2.5 MG capsule Take 1 capsule (2.5 mg total) by mouth daily. 90 capsule 1  . simvastatin (ZOCOR) 20 MG tablet Take 1 tablet (20 mg total) by mouth daily. 90 tablet 1  . Spacer/Aero-Holding Chambers (AEROCHAMBER PLUS) inhaler Use as instructed with MDI 1 each 2  . Stinging Nettle 2 % POWD Take 1 tablet by mouth as needed. Reported on 12/16/2015    . traZODone (DESYREL) 50 MG tablet TAKE 1/2 TO 1 TABLET BY MOUTH AT BEDTIME AS NEEDED FOR SLEEP 90 tablet 1  . TURMERIC PO Take 800 mg by mouth as needed. Reported on 12/16/2015    . azelastine (ASTELIN) 0.1 % nasal spray Place 2 sprays into both nostrils 2 (two) times daily. Use in each nostril as directed 90 mL 1  . budesonide-formoterol (SYMBICORT) 80-4.5 MCG/ACT inhaler Inhale 2 puffs into the lungs 2 (two) times daily.  3 Inhaler 1  . fluticasone (FLONASE) 50 MCG/ACT nasal spray Place 1 spray into both nostrils 2 (two) times daily. 48 g 1  . levocetirizine (XYZAL) 5 MG tablet Take 1 tablet (5 mg total) by mouth every evening. 30 tablet 1  . montelukast (SINGULAIR) 10 MG tablet Take 1 tablet (10 mg total) by mouth at bedtime. 90 tablet 1   No current facility-administered medications for this visit.    Allergies: Allergies  Allergen Reactions  . Peanut-Containing Drug Products Anaphylaxis  . Milk-Related Compounds Nausea And Vomiting    Milk and cheese  . Actos [Pioglitazone] Nausea Only  . Soy Allergy Nausea Only    Pt states had allergy testing and was told allergic to soy-causes nausea but no other reactions  . Vicodin [Hydrocodone-Acetaminophen] Nausea Only   I reviewed her past medical  history, social history, family history, and environmental history and no significant changes have been reported from previous visit on 08/24/2018.  Review of Systems  Constitutional: Negative for appetite change, chills, fever and unexpected weight change.  HENT: Positive for congestion. Negative for rhinorrhea.   Eyes: Negative for itching.  Respiratory: Negative for cough, chest tightness, shortness of breath and wheezing.   Gastrointestinal: Negative for abdominal pain.  Skin: Negative for rash.  Allergic/Immunologic: Positive for environmental allergies and food allergies.  Neurological: Negative for headaches.   Objective: Physical Exam Not obtained as encounter was done via telephone.   Previous notes and tests were reviewed.  I discussed the assessment and treatment plan with the patient. The patient was provided an opportunity to ask questions and all were answered. The patient agreed with the plan and demonstrated an understanding of the instructions. After visit summary/patient instructions available via mychart.   The patient was advised to call back or seek an in-person evaluation if the symptoms worsen or if the condition fails to improve as anticipated.  I provided 24 minutes of non-face-to-face time during this encounter.  It was my pleasure to participate in [place] care today. Please feel free to contact me with any questions or concerns.   Sincerely,  Rexene Alberts, DO Allergy & Immunology  [facility] office: [phone] [facility] office: [phone]

## 2019-03-20 NOTE — Patient Instructions (Addendum)
1. Perennial and seasonal allergic rhinoconjunctivitis  Continue environmental control measures.  Discussed that using the antihistamines with the decongestant for long term is not recommended as it can cause hypertension and rebound effects.  Continue Flonase 1 spray twice a day and azelastine 1 spray twice a day.  Nasal saline spray (i.e., Simply Saline) or nasal saline lavage (i.e., NeilMed) is recommended as needed and prior to medicated nasal sprays.  Continue montelukast 10mg  daily.   May use over the counter antihistamines such as Zyrtec (cetirizine), Claritin (loratadine), Allegra (fexofenadine), or Xyzal (levocetirizine) daily as needed.   2. Moderate persistent asthma, uncomplicated . Daily controller medication(s): start Symbicort 80 2 puffs twice a day and rinse mouth afterwards.  o Let us know if the cost is too much but according to your insurance this should be cheaper than Dulera.  . Prior to physical activity: May use albuterol rescue inhaler 2 puffs 5 to 15 minutes prior to strenuous physical activities. Marland Kitchen Rescue medications: May use albuterol rescue inhaler 2 puffs or nebulizer every 4 to 6 hours as needed for shortness of breath, chest tightness, coughing, and wheezing. Monitor frequency of use.  . Asthma control goals:  o Full participation in all desired activities (may need albuterol before activity) o Albuterol use two times or less a week on average (not counting use with activity) o Cough interfering with sleep two times or less a month o Oral steroids no more than once a year o No hospitalizations  3. Anaphylactic shock due to food, subsequent encounter  Continue to avoid peanuts, cow's milk, soybean  For mild symptoms you can take over the counter antihistamines such as Benadryl and monitor symptoms closely. If symptoms worsen or if you have severe symptoms including breathing issues, throat closure, significant swelling, whole body hives, severe diarrhea  and vomiting, lightheadedness then inject epinephrine and seek immediate medical care afterwards.  I sent prescriptions to your mail order and local [facility] pharmacy.  Follow up in 3-4 months.

## 2019-03-21 ENCOUNTER — Encounter: Payer: Self-pay | Admitting: Allergy

## 2019-03-21 NOTE — Assessment & Plan Note (Signed)
Felt better with [facility] but could not afford to purchase after samples ran out. Having some issues with her breathing at night. . Daily controller medication(s): start Symbicort 80 2 puffs twice a day and rinse mouth afterwards.  o Let us know if the cost is too much but according to your insurance this should be cheaper than Dulera.  . Prior to physical activity: May use albuterol rescue inhaler 2 puffs 5 to 15 minutes prior to strenuous physical activities. Marland Kitchen Rescue medications: May use albuterol rescue inhaler 2 puffs or nebulizer every 4 to 6 hours as needed for shortness of breath, chest tightness, coughing, and wheezing. Monitor frequency of use.

## 2019-03-21 NOTE — Assessment & Plan Note (Addendum)
No accidental ingestions. Patient did not pick up Epipen due to cost.   Continue to avoid peanuts, cow's milk, soybean  I have prescribed epinephrine injectable and demonstrated proper use. For mild symptoms you can take over the counter antihistamines such as Benadryl and monitor symptoms closely. If symptoms worsen or if you have severe symptoms including breathing issues, throat closure, significant swelling, whole body hives, severe diarrhea and vomiting, lightheadedness then inject epinephrine and seek immediate medical care afterwards.

## 2019-03-21 NOTE — Assessment & Plan Note (Signed)
Stable.  Continue environmental control measures.  Discussed that using the antihistamines with the decongestant for long term is not recommended as it can cause hypertension and rebound effects.  May use over the counter antihistamines such as Zyrtec (cetirizine), Claritin (loratadine), Allegra (fexofenadine), or Xyzal (levocetirizine) daily as needed.  Continue Flonase 1 spray twice a day and azelastine 1 spray twice a day.  Nasal saline spray (i.e., Simply Saline) or nasal saline lavage (i.e., NeilMed) is recommended as needed and prior to medicated nasal sprays.  Continue montelukast 10mg  daily.

## 2019-03-23 ENCOUNTER — Encounter: Payer: Self-pay | Admitting: Adult Health

## 2019-03-23 ENCOUNTER — Encounter: Payer: Self-pay | Admitting: Family Medicine

## 2019-03-26 ENCOUNTER — Ambulatory Visit (INDEPENDENT_AMBULATORY_CARE_PROVIDER_SITE_OTHER): Payer: Medicare HMO

## 2019-03-26 ENCOUNTER — Other Ambulatory Visit: Payer: Self-pay

## 2019-03-26 DIAGNOSIS — J3089 Other allergic rhinitis: Secondary | ICD-10-CM | POA: Diagnosis not present

## 2019-03-26 NOTE — Patient Outreach (Signed)
[place] [facility]) Care Management  03/26/2019  Kimberly Lam 1946/08/11 [id number]   BSW received phone call from [name] with [employer] regarding referral for assistance with purchase of glasses.  [name] reported that the organization will be happy to assist patient with purchasing glasses and informed BSW of process for this. Patient will need to have eye exam completed at [facility] or [place].  Unfortunately, [facility] is not providing services right now due to COVID-19.  Patient is expected to pay $20 copay for eye exam unless her insurance provider is in-network and there is no copay or it is cheaper.  The remaining cost for the exam and glasses will be billed to the [employer].  BSW encouraged patient to call insurance provider to determine if [facility] is an Software engineer provider.  BSW contacted patient and provided her with above update.  BSW and patient also discussed hearing test needed for financial assistance with purchase of hearing aid through [place] Division of Services for the Deaf and Hard of Hearing.  Unfortunately, patient is also unable to get hearing test as of right now due to COVID-19.  BSW will follow up with patient next month and proceed with referral process when able.  Ronn Melena, BSW Social Worker [phone]

## 2019-03-29 ENCOUNTER — Other Ambulatory Visit: Payer: Self-pay | Admitting: Adult Health

## 2019-04-01 NOTE — Telephone Encounter (Signed)
DENIED.  THIS HAS BEEN SENT TO [facility].

## 2019-04-02 ENCOUNTER — Ambulatory Visit (INDEPENDENT_AMBULATORY_CARE_PROVIDER_SITE_OTHER): Payer: Medicare HMO | Admitting: *Deleted

## 2019-04-02 DIAGNOSIS — J309 Allergic rhinitis, unspecified: Secondary | ICD-10-CM

## 2019-04-03 ENCOUNTER — Other Ambulatory Visit: Payer: Self-pay | Admitting: Adult Health

## 2019-04-04 MED ORDER — SIMVASTATIN 20 MG PO TABS: 20.0000 mg | ORAL_TABLET | Freq: Every day | ORAL | 1 refills | Status: DC

## 2019-04-09 ENCOUNTER — Ambulatory Visit (INDEPENDENT_AMBULATORY_CARE_PROVIDER_SITE_OTHER): Payer: Medicare HMO | Admitting: *Deleted

## 2019-04-09 DIAGNOSIS — J309 Allergic rhinitis, unspecified: Secondary | ICD-10-CM

## 2019-04-11 ENCOUNTER — Other Ambulatory Visit: Payer: Self-pay

## 2019-04-11 MED ORDER — NYSTATIN 100000 UNIT/ML MT SUSP: OROMUCOSAL | 1 refills | Status: DC

## 2019-04-11 NOTE — Patient Outreach (Signed)
[place] [facility]) Care Management  04/11/2019  Parissa Chiao Shimada 1946-02-05 [id number]    1st outreach attempt to the patient for initial assessment.  No answer.  HIPAA compliant voicemail left with contact information.  Plan: RN Health Coach will send letter. [place] will make outreach attempt to the patient within thirty business days.  Lazaro Arms RN, BSN, [place] Direct Dial:  [phone]  Fax: [phone]

## 2019-04-11 NOTE — Patient Outreach (Signed)
[place] [facility]) Care Management  04/11/2019   Cyndra Feinberg Posten [birth year] [id number]      Return call for initial assessment.  HIPAA verified.  The patient was able to complete the assessment.  Social: the patient lives in thee home with her husband and brother,  She states that she is independent with ADLS/IADLS.  She denies any pain or falls.  The patient has transportation to her appointments.The patient is currently working with Social work for [employer] and spoken with [place].  The durable medical equipment in the home include accu-check glucometer and glasses.  Conditions: Per chart review and conversation with the patient her conditions include DM type II, Anemia, Hyperlipidemia, and Asthma.  The patient states that her blood sugar this morning ran 135.  She states that she checks her blood sugars 3-4 times a day.  She states that she visits her PCP twice a year.  Her most recent visit was in March and her A!C was 6.5.  She states that she monitors the carbs in her food and fluid intake.  She states that she eats to meals a day and a snack before bed.  She states that she drinks a lot.  The patient states that her and her husband walk daily either a half mile or mile for exercise.   Medications: Per chart review and speaking with the patient she is on 11 medications.  She did not voice any concern about paying for her meds.  She states that she has a med planner that she fills.  Appointments: The patient states that she was seen by her PCP in March and will follow up in august or September.  Advanced Directives:  The patient states that she has the advanced directive paper work but just needs to have them filled out.   Current Medications:  Current Outpatient Medications  Medication Sig Dispense Refill  . Accu-Chek FastClix Lancets MISC USE TO TEST BLOOD GLUCOSE 3-4 TIMES DAILY 360 each 3  . Alcohol Swabs (B-D SINGLE USE SWABS REGULAR) PADS USE TO  TEST BLOOD GLUCOSE FOUR TIMES DAILY 200 each 3  . alendronate (FOSAMAX) 70 MG tablet TAKE 1 TABLET BY MOUTH 1  TIME A WEEK. TAKE WITH A  FULL GLASS OF WATER ON AN  EMPTY STOMACH. 12 tablet 1  . Ascorbic Acid (VITAMIN C) 1000 MG tablet Take 1,000 mg by mouth as needed.    Marland Kitchen azelastine (ASTELIN) 0.1 % nasal spray Place 2 sprays into both nostrils 2 (two) times daily. Use in each nostril as directed 90 mL 1  . Barberry-Oreg Grape-Goldenseal (BERBERINE COMPLEX PO) Take by mouth daily.    . Blood Glucose Monitoring Suppl (ACCU-CHEK AVIVA PLUS) w/Device KIT     . budesonide-formoterol (SYMBICORT) 80-4.5 MCG/ACT inhaler Inhale 2 puffs into the lungs 2 (two) times daily. 3 Inhaler 1  . cholecalciferol (VITAMIN D) 1000 units tablet Take 1,000 Units by mouth daily.    Marland Kitchen CINNAMON PO Take 1 tablet by mouth as needed.    . Cranberry 450 MG CAPS Take 1 capsule by mouth as needed.    . Cyanocobalamin (VITAMIN B-12 CR) 1500 MCG TBCR Take 1 tablet by mouth as needed.    . Echinacea-Goldenseal (ECHINACEA COMB/GOLDEN SEAL PO) Take 1 tablet by mouth as needed. Reported on 12/16/2015    . fluticasone (FLONASE) 50 MCG/ACT nasal spray Place 1 spray into both nostrils 2 (two) times daily. 48 g 1  . gabapentin (NEURONTIN) 300 MG capsule Take 1  capsule (300 mg total) by mouth 4 (four) times daily. 360 capsule 4  . glucose blood (ACCU-CHEK GUIDE) test strip USE TO TEST BLOOD GLUCOSE FOUR TIMES DAILY 300 each 3  . Green Tea, Camillia sinensis, (GREEN TEA PO) Take by mouth as needed. Reported on 12/16/2015    . Lactobacillus (ACIDOPHILUS PROBIOTIC PO) Take 1 tablet by mouth daily.    Marland Kitchen levocetirizine (XYZAL) 5 MG tablet Take 1 tablet (5 mg total) by mouth every evening. 30 tablet 1  . metFORMIN (GLUCOPHAGE-XR) 500 MG 24 hr tablet Take 2 tablets (1,000 mg total) by mouth 2 (two) times daily. 360 tablet 0  . montelukast (SINGULAIR) 10 MG tablet Take 1 tablet (10 mg total) by mouth at bedtime. 90 tablet 1  . Omega-3 Fatty Acids  (OMEGA 3 PO) Take 1 tablet by mouth as needed.    Marland Kitchen omeprazole (PRILOSEC) 40 MG capsule Take 40 mg by mouth as needed.     Vladimir Faster Glycol-Propyl Glycol (SYSTANE FREE OP) Apply 1 drop to eye daily.    . ramipril (ALTACE) 2.5 MG capsule Take 1 capsule (2.5 mg total) by mouth daily. 90 capsule 1  . simvastatin (ZOCOR) 20 MG tablet Take 1 tablet (20 mg total) by mouth daily. 90 tablet 1  . Spacer/Aero-Holding Chambers (AEROCHAMBER PLUS) inhaler Use as instructed with MDI 1 each 2  . Stinging Nettle 2 % POWD Take 1 tablet by mouth as needed. Reported on 12/16/2015    . traZODone (DESYREL) 50 MG tablet TAKE 1/2 TO 1 TABLET BY MOUTH AT BEDTIME AS NEEDED FOR SLEEP 90 tablet 1  . TURMERIC PO Take 800 mg by mouth as needed. Reported on 12/16/2015    . nystatin (MYCOSTATIN) 100000 UNIT/ML suspension Swish and Swallow 5 mL 4 times daily (Patient not taking: Reported on 04/11/2019) 60 mL 1   No current facility-administered medications for this visit.     Functional Status:  In your present state of health, do you have any difficulty performing the following activities: 04/11/2019  Hearing? Y  Comment sounds like things said muffled  Vision? N  Difficulty concentrating or making decisions? N  Walking or climbing stairs? Y  Comment kness hurt her when she has to climb stairs  Dressing or bathing? N  Doing errands, shopping? N  Some recent data might be hidden    Fall/Depression Screening: Fall Risk  04/11/2019 03/12/2019 10/18/2018  Falls in the past year? 0 0 No  Number falls in past yr: - 0 -  Injury with Fall? - 0 -   PHQ 2/9 Scores 04/11/2019 03/12/2019 10/18/2018 06/13/2017  PHQ - 2 Score 0 0 0 0    Assessment: Patient will benefit from health coach outreach for disease management and support.  THN CM Care Plan Problem One     Most Recent Value  Care Plan Problem One  Knowledge deficit related to diease management  Role Documenting the Problem One  [place] for Problem  One  Active  THN Long Term Goal   In 30 days the patient will maintain her a1c around 6.8  THN Long Term Goal Start Date  04/11/19  Interventions for Problem One Long Term Goal  Reviewed cbg record, discussed food intake and importance of eating meals, reviewed medications and encouraged  medication management     Plan: [place] [facility] will provide ongoing education for patient on diabetes through phone calls and sending printed information to patient for further discussion.  RN Health  Coach will send printed information on diabetes.  RN Health Coach will send initial barriers letter, assessment, and care plan to primary care physician.  RN Health Coach will contact patient in the month of May and patient agrees to next outreach.   Lazaro Arms RN, BSN, [place] Direct Dial:  [phone]  Fax: [phone]

## 2019-04-16 ENCOUNTER — Ambulatory Visit (INDEPENDENT_AMBULATORY_CARE_PROVIDER_SITE_OTHER): Payer: Medicare HMO | Admitting: *Deleted

## 2019-04-16 DIAGNOSIS — J309 Allergic rhinitis, unspecified: Secondary | ICD-10-CM | POA: Diagnosis not present

## 2019-04-22 ENCOUNTER — Ambulatory Visit: Payer: Medicare HMO

## 2019-04-23 ENCOUNTER — Other Ambulatory Visit: Payer: Self-pay

## 2019-04-23 ENCOUNTER — Ambulatory Visit: Payer: Self-pay

## 2019-04-23 ENCOUNTER — Ambulatory Visit (INDEPENDENT_AMBULATORY_CARE_PROVIDER_SITE_OTHER): Payer: Medicare HMO | Admitting: *Deleted

## 2019-04-23 DIAGNOSIS — J309 Allergic rhinitis, unspecified: Secondary | ICD-10-CM

## 2019-04-23 NOTE — Patient Outreach (Addendum)
[place] [facility]) Care Management  04/23/2019  Kimberly Lam [place] Apr 24, 1946 [id number]   Unsuccessful outreach to patient to follow up on social work referral for vision and hearing aid resources.  BSW left voicemail message.  Will attempt to reach again within four business days.   Addendum: BSW received return call from patient.  Due to [place], patient prefers to wait on scheduling eye exam and hearing test required to receive assistance through [place] for glasses and [place] Division of Services for the Deaf and Hard of Hearing for hearing aid.   BSW will follow up with her again within the next three weeks.   Ronn Melena, BSW Social Worker [phone]

## 2019-04-26 ENCOUNTER — Ambulatory Visit: Payer: Self-pay

## 2019-04-30 ENCOUNTER — Ambulatory Visit (INDEPENDENT_AMBULATORY_CARE_PROVIDER_SITE_OTHER): Payer: Medicare HMO

## 2019-04-30 DIAGNOSIS — J309 Allergic rhinitis, unspecified: Secondary | ICD-10-CM

## 2019-05-04 ENCOUNTER — Other Ambulatory Visit: Payer: Self-pay | Admitting: Adult Health

## 2019-05-06 NOTE — Telephone Encounter (Signed)
Sent to the pharmacy by e-scribe. 

## 2019-05-08 ENCOUNTER — Telehealth: Payer: Self-pay | Admitting: *Deleted

## 2019-05-08 MED ORDER — FLUTICASONE-SALMETEROL 115-21 MCG/ACT IN AERO: 2.0000 | INHALATION_SPRAY | Freq: Two times a day (BID) | RESPIRATORY_TRACT | 5 refills | Status: DC

## 2019-05-08 NOTE — Telephone Encounter (Signed)
Patient called and stated that she has been using her Symbicort for over one month now and states that whenever she does use her Symbicort her tongue and throat swell and the roof of her mouth become sore, and that even her gums begin to bleed. Patient stated that when she goes without her inhaler for a few days her symptoms begin to improve. She has been off of her Symbicort for the last 2-3 days. Please advise. Patient did verify that she has not had any issues with breathing in terms of anaphylaxis.

## 2019-05-08 NOTE — Telephone Encounter (Signed)
Please call back patient.  Stop Symbicort.  Has she noticed any worsening of her asthma?  Did the [facility] cause these problems? She needs to be on a daily inhaler and looks like Advair is covered. I will send in to use 2 puffs twice a day with spacer and rinse mouth afterwards. If she has the same problem with Advair then stop using it and let us know. We may have to get PA for [facility] then if that one did not give her this problem.

## 2019-05-08 NOTE — Telephone Encounter (Signed)
Called patient and advised of medication change. Patient verbalized understanding.

## 2019-05-13 ENCOUNTER — Ambulatory Visit: Payer: Self-pay

## 2019-05-14 ENCOUNTER — Ambulatory Visit: Payer: Self-pay

## 2019-05-14 ENCOUNTER — Ambulatory Visit (INDEPENDENT_AMBULATORY_CARE_PROVIDER_SITE_OTHER): Payer: Medicare HMO | Admitting: *Deleted

## 2019-05-14 DIAGNOSIS — J309 Allergic rhinitis, unspecified: Secondary | ICD-10-CM

## 2019-05-15 ENCOUNTER — Other Ambulatory Visit: Payer: Self-pay

## 2019-05-15 NOTE — Patient Outreach (Signed)
[place] [facility]) Care Management  05/15/2019  Erendida Wrenn [place] 1946/01/04 [id number]  Follow up call to patient regarding social work referral for assistance with purchasing eye glasses and hearing aid.  Patient was referred some time ago but process for assistance has been delayed due to [place] restrictions.   BSW reminded patient of process for requesting hearing aid through [place] Division of Services for the Deaf and Hard of Hearing. Patient must first complete a hearing test.  BSW provided her with contact information for [facility] on [address] which is a preferred vendor of the division. BSW also reminded patient of process for requesting eye glasses through [employer]. BSW provided her with contact information for [facility] as this is where she will need to complete her eye exam and select frames.  BSW is unsure if these providers are accepting new patients due to continued COVID19 restrictions.  BSW will follow up with patient again within the next month.  Ronn Melena, BSW Social Worker [phone]

## 2019-05-16 ENCOUNTER — Other Ambulatory Visit: Payer: Self-pay

## 2019-05-16 NOTE — Patient Outreach (Signed)
[facility]) Care Management  05/16/2019   Kimberly Lam 1946/07/13 [id number]  Subjective: Successful outreach to the patient.  Two patient Identifiers obtained.  The patient state she is doing well.  Her FBS was 131.  She is still monitoring her diet and exercising.  She denies any pain or falls.  She has her next appointment to follow up on her diabetes the end of June early part of July.  Current Medications:  Current Outpatient Medications  Medication Sig Dispense Refill  . Accu-Chek FastClix Lancets MISC USE TO TEST BLOOD GLUCOSE 3-4 TIMES DAILY 360 each 3  . Alcohol Swabs (B-D SINGLE USE SWABS REGULAR) PADS USE TO TEST BLOOD GLUCOSE FOUR TIMES DAILY 200 each 3  . alendronate (FOSAMAX) 70 MG tablet TAKE 1 TABLET BY MOUTH 1  TIME A WEEK. TAKE WITH A  FULL GLASS OF WATER ON AN  EMPTY STOMACH. 12 tablet 1  . Ascorbic Acid (VITAMIN C) 1000 MG tablet Take 1,000 mg by mouth as needed.    Marland Kitchen azelastine (ASTELIN) 0.1 % nasal spray Place 2 sprays into both nostrils 2 (two) times daily. Use in each nostril as directed 90 mL 1  . Barberry-Oreg Grape-Goldenseal (BERBERINE COMPLEX PO) Take by mouth daily.    . Blood Glucose Monitoring Suppl (ACCU-CHEK AVIVA PLUS) w/Device KIT     . budesonide-formoterol (SYMBICORT) 80-4.5 MCG/ACT inhaler Inhale 2 puffs into the lungs 2 (two) times daily. 3 Inhaler 1  . cholecalciferol (VITAMIN D) 1000 units tablet Take 1,000 Units by mouth daily.    Marland Kitchen CINNAMON PO Take 1 tablet by mouth as needed.    . Cranberry 450 MG CAPS Take 1 capsule by mouth as needed.    . Cyanocobalamin (VITAMIN B-12 CR) 1500 MCG TBCR Take 1 tablet by mouth as needed.    . Echinacea-Goldenseal (ECHINACEA COMB/GOLDEN SEAL PO) Take 1 tablet by mouth as needed. Reported on 12/16/2015    . fluticasone (FLONASE) 50 MCG/ACT nasal spray Place 1 spray into both nostrils 2 (two) times daily. 48 g 1  . fluticasone-salmeterol (ADVAIR HFA) 115-21 MCG/ACT inhaler Inhale 2 puffs  into the lungs 2 (two) times daily. Use with spacer and rinse mouth afterwards. 1 Inhaler 5  . gabapentin (NEURONTIN) 300 MG capsule Take 1 capsule (300 mg total) by mouth 4 (four) times daily. 360 capsule 4  . glucose blood (ACCU-CHEK GUIDE) test strip USE TO TEST BLOOD GLUCOSE FOUR TIMES DAILY 300 each 3  . Green Tea, Camillia sinensis, (GREEN TEA PO) Take by mouth as needed. Reported on 12/16/2015    . Lactobacillus (ACIDOPHILUS PROBIOTIC PO) Take 1 tablet by mouth daily.    Marland Kitchen levocetirizine (XYZAL) 5 MG tablet Take 1 tablet (5 mg total) by mouth every evening. 30 tablet 1  . metFORMIN (GLUCOPHAGE-XR) 500 MG 24 hr tablet TAKE 2 TABLETS BY MOUTH TWICE A DAY 360 tablet 0  . montelukast (SINGULAIR) 10 MG tablet Take 1 tablet (10 mg total) by mouth at bedtime. 90 tablet 1  . Omega-3 Fatty Acids (OMEGA 3 PO) Take 1 tablet by mouth as needed.    Marland Kitchen omeprazole (PRILOSEC) 40 MG capsule Take 40 mg by mouth as needed.     Vladimir Faster Glycol-Propyl Glycol (SYSTANE FREE OP) Apply 1 drop to eye daily.    . ramipril (ALTACE) 2.5 MG capsule Take 1 capsule (2.5 mg total) by mouth daily. 90 capsule 1  . simvastatin (ZOCOR) 20 MG tablet Take 1 tablet (20 mg total) by mouth  daily. 90 tablet 1  . Spacer/Aero-Holding Chambers (AEROCHAMBER PLUS) inhaler Use as instructed with MDI 1 each 2  . Stinging Nettle 2 % POWD Take 1 tablet by mouth as needed. Reported on 12/16/2015    . traZODone (DESYREL) 50 MG tablet TAKE 1/2 TO 1 TABLET BY MOUTH AT BEDTIME AS NEEDED FOR SLEEP 90 tablet 1  . TURMERIC PO Take 800 mg by mouth as needed. Reported on 12/16/2015    . nystatin (MYCOSTATIN) 100000 UNIT/ML suspension Swish and Swallow 5 mL 4 times daily (Patient not taking: Reported on 05/16/2019) 60 mL 1   No current facility-administered medications for this visit.     Functional Status:  In your present state of health, do you have any difficulty performing the following activities: 04/11/2019  Hearing? Y  Comment sounds like  things said muffled  Vision? N  Difficulty concentrating or making decisions? N  Walking or climbing stairs? Y  Comment kness hurt her when she has to climb stairs  Dressing or bathing? N  Doing errands, shopping? N  Some recent data might be hidden    Fall/Depression Screening: Fall Risk  05/16/2019 04/11/2019 03/12/2019  Falls in the past year? 0 0 0  Number falls in past yr: - - 0  Injury with Fall? - - 0   PHQ 2/9 Scores 04/11/2019 03/12/2019 10/18/2018 06/13/2017  PHQ - 2 Score 0 0 0 0    Assessment: Patient will continue to benefit from health coach outreach for disease management and support. THN CM Care Plan Problem One     Most Recent Value  THN Long Term Goal   In 90 days the patient will maintain her a1c around 6.8  THN Long Term Goal Start Date  05/16/19  Interventions for Problem One Long Term Goal  Reviewed [facility] records, encouraged to continue monitoring diet, exercise and medication adherence       Plan: RN Health Coach will contact patient in the month of July and patient agrees to next outreach.   Lazaro Arms RN, BSN, [place] Direct Dial:  [phone]  Fax: [phone]

## 2019-05-28 ENCOUNTER — Ambulatory Visit (INDEPENDENT_AMBULATORY_CARE_PROVIDER_SITE_OTHER): Payer: Medicare HMO | Admitting: *Deleted

## 2019-05-28 DIAGNOSIS — J309 Allergic rhinitis, unspecified: Secondary | ICD-10-CM | POA: Diagnosis not present

## 2019-06-04 ENCOUNTER — Other Ambulatory Visit: Payer: Self-pay

## 2019-06-04 NOTE — Patient Outreach (Signed)
[name]) Care Management  06/04/2019  Kimberly Lam [birth year] [id number]  BSW is assisting patient and her spouse with obtaining hearing aids through [place] Division of Services for the Deaf and Hard of Hearing.  Patient reports that hearing test is scheduled at [facility] on 06/26/19. BSW is also assisting patient and spouse with getting eye glasses through [employer]  They are required to utilize [facility] for the exam and glasses.  Patient contacted [facility] today but they must be contacted by a representative from [facility] to proceed with appointments.  BSW message [name] about this.  Will follow up with patient when response is received from him.  Ronn Melena, BSW Social Worker [phone]

## 2019-06-11 ENCOUNTER — Ambulatory Visit: Payer: Self-pay

## 2019-06-11 ENCOUNTER — Other Ambulatory Visit: Payer: Self-pay

## 2019-06-11 NOTE — Patient Outreach (Signed)
[place] [facility]) Care Management  06/11/2019  Alura Olveda Cordon 17-Aug-1946 [id number]   BSW is assisting patient and her spouse with obtaining hearing aids through [place] Division of Services for the Deaf and Hard of Hearing.  Patient reports that hearing test is scheduled at [facility] on 06/26/19. BSW is also assisting patient and spouse with getting eye glasses through [employer]  They are required to utilize [facility] for the exam and glasses.  Patient contacted [facility] but they must be contacted by a representative from [facility] to proceed with appointments.  BSW messaged [name] from [employer] about this on 06/04/19.   Received the following response:  "[facility] is not yet taking referrals from [place]. We may get a few in July, but too soon to tell. I will get your clients in the system as soon as we can. I'll send out another note to you in early July." BSW attempted to contact patient today to inform her of this but had to leave a voicemail message.  Will follow up with [name] and patient at the beginning of July.  Ronn Melena, BSW Social Worker [phone]

## 2019-06-13 ENCOUNTER — Other Ambulatory Visit: Payer: Self-pay | Admitting: Adult Health

## 2019-06-14 ENCOUNTER — Ambulatory Visit: Payer: Self-pay

## 2019-06-14 ENCOUNTER — Ambulatory Visit: Payer: Medicare HMO

## 2019-06-14 NOTE — Telephone Encounter (Signed)
DENIED.  PRESCRIBED FOR 6 MONTHS ON 02/27/2019.  MESSAGE SENT TO THE PHARMACY.

## 2019-06-17 ENCOUNTER — Ambulatory Visit (INDEPENDENT_AMBULATORY_CARE_PROVIDER_SITE_OTHER): Payer: Medicare HMO | Admitting: *Deleted

## 2019-06-17 DIAGNOSIS — J309 Allergic rhinitis, unspecified: Secondary | ICD-10-CM | POA: Diagnosis not present

## 2019-06-18 ENCOUNTER — Telehealth: Payer: Self-pay | Admitting: Neurology

## 2019-06-18 ENCOUNTER — Ambulatory Visit (INDEPENDENT_AMBULATORY_CARE_PROVIDER_SITE_OTHER): Payer: Medicare HMO | Admitting: Adult Health

## 2019-06-18 ENCOUNTER — Encounter: Payer: Self-pay | Admitting: Adult Health

## 2019-06-18 ENCOUNTER — Other Ambulatory Visit: Payer: Self-pay

## 2019-06-18 DIAGNOSIS — G4700 Insomnia, unspecified: Secondary | ICD-10-CM

## 2019-06-18 DIAGNOSIS — M810 Age-related osteoporosis without current pathological fracture: Secondary | ICD-10-CM | POA: Diagnosis not present

## 2019-06-18 DIAGNOSIS — E119 Type 2 diabetes mellitus without complications: Secondary | ICD-10-CM | POA: Diagnosis not present

## 2019-06-18 DIAGNOSIS — T148XXA Other injury of unspecified body region, initial encounter: Secondary | ICD-10-CM | POA: Diagnosis not present

## 2019-06-18 MED ORDER — CYCLOBENZAPRINE HCL 5 MG PO TABS: 5.0000 mg | ORAL_TABLET | Freq: Three times a day (TID) | ORAL | 1 refills | Status: DC | PRN

## 2019-06-18 MED ORDER — TRAZODONE HCL 50 MG PO TABS: 25.0000 mg | ORAL_TABLET | Freq: Every evening | ORAL | 1 refills | Status: DC | PRN

## 2019-06-18 MED ORDER — ALENDRONATE SODIUM 70 MG PO TABS: ORAL_TABLET | ORAL | 3 refills | Status: DC

## 2019-06-18 MED ORDER — RAMIPRIL 2.5 MG PO CAPS: 2.5000 mg | ORAL_CAPSULE | Freq: Every day | ORAL | 1 refills | Status: DC

## 2019-06-18 NOTE — Progress Notes (Signed)
Virtual Visit via Video Note  I connected with Kimberly Lam on 06/18/19 at  2:30 PM EDT by a video enabled telemedicine application and verified that I am speaking with the correct person using two identifiers.  Location patient: home Location provider:work or home office Persons participating in the virtual visit: patient, provider  I discussed the limitations of evaluation and management by telemedicine and the availability of in person appointments. The patient expressed understanding and agreed to proceed.   HPI: 73 year old female who is being evaluated today for an acute complaint.  She reports that her symptoms started upon awakening 3 days ago.  Per patient she states "I think I slept wrong and when I woke up my neck was really stiff.  Since that time my low back has become really stiff as well especially in the left lower area."  She does report warm showers help as well as massage.  She has not been using any over-the-counter medications.  Is worse with quick motions, bending, twisting, and change of position.  He denies headaches, blurred vision, trauma, or issues with bowel or bladder.  She also needs medications refilled   ROS: See pertinent positives and negatives per HPI.  Past Medical History:  Diagnosis Date  . Acid reflux   . Allergic rhinitis   . Anemia   . Asthma   . Bilateral sciatica   . Chronic bilateral low back pain   . Colon polyps   . Diverticulosis   . DM type 2 (diabetes mellitus, type 2) ([place])   . Environmental allergies    flowers, pollen, trees  . GERD (gastroesophageal reflux disease)   . Hyperlipidemia   . Low back pain   . Migraine     Past Surgical History:  Procedure Laterality Date  . CATARACT EXTRACTION, BILATERAL Bilateral 2012  . CESAREAN SECTION     x 2  . DILATION AND CURETTAGE OF UTERUS    . NASAL SINUS SURGERY    . TUBAL LIGATION      Family History  Problem Relation Age of Onset  . Asthma Father   . Emphysema Father   .  Alcohol abuse Father   . Diabetes Mellitus II Brother        x 2  . Hyperlipidemia Brother   . Hypertension Brother   . Pancreatic cancer Brother   . Prostate cancer Brother   . Heart disease Maternal Grandmother        hardening of the arteries  . Heart murmur Daughter   . Glaucoma Brother   . Breast cancer Neg Hx       Current Outpatient Medications:  .  Accu-Chek FastClix Lancets MISC, USE TO TEST BLOOD GLUCOSE 3-4 TIMES DAILY, Disp: 360 each, Rfl: 3 .  Alcohol Swabs (B-D SINGLE USE SWABS REGULAR) PADS, USE TO TEST BLOOD GLUCOSE FOUR TIMES DAILY, Disp: 200 each, Rfl: 3 .  alendronate (FOSAMAX) 70 MG tablet, TAKE 1 TABLET BY MOUTH 1  TIME A WEEK. TAKE WITH A  FULL GLASS OF WATER ON AN  EMPTY STOMACH., Disp: 12 tablet, Rfl: 1 .  Ascorbic Acid (VITAMIN C) 1000 MG tablet, Take 1,000 mg by mouth as needed., Disp: , Rfl:  .  azelastine (ASTELIN) 0.1 % nasal spray, Place 2 sprays into both nostrils 2 (two) times daily. Use in each nostril as directed, Disp: 90 mL, Rfl: 1 .  Barberry-Oreg Grape-Goldenseal (BERBERINE COMPLEX PO), Take by mouth daily., Disp: , Rfl:  .  Blood Glucose Monitoring Suppl (  ACCU-CHEK AVIVA PLUS) w/Device KIT, , Disp: , Rfl:  .  budesonide-formoterol (SYMBICORT) 80-4.5 MCG/ACT inhaler, Inhale 2 puffs into the lungs 2 (two) times daily., Disp: 3 Inhaler, Rfl: 1 .  cholecalciferol (VITAMIN D) 1000 units tablet, Take 1,000 Units by mouth daily., Disp: , Rfl:  .  CINNAMON PO, Take 1 tablet by mouth as needed., Disp: , Rfl:  .  Cranberry 450 MG CAPS, Take 1 capsule by mouth as needed., Disp: , Rfl:  .  Cyanocobalamin (VITAMIN B-12 CR) 1500 MCG TBCR, Take 1 tablet by mouth as needed., Disp: , Rfl:  .  Echinacea-Goldenseal (ECHINACEA COMB/GOLDEN SEAL PO), Take 1 tablet by mouth as needed. Reported on 12/16/2015, Disp: , Rfl:  .  fluticasone (FLONASE) 50 MCG/ACT nasal spray, Place 1 spray into both nostrils 2 (two) times daily., Disp: 48 g, Rfl: 1 .  fluticasone-salmeterol  (ADVAIR HFA) 115-21 MCG/ACT inhaler, Inhale 2 puffs into the lungs 2 (two) times daily. Use with spacer and rinse mouth afterwards., Disp: 1 Inhaler, Rfl: 5 .  gabapentin (NEURONTIN) 300 MG capsule, Take 1 capsule (300 mg total) by mouth 4 (four) times daily., Disp: 360 capsule, Rfl: 4 .  glucose blood (ACCU-CHEK GUIDE) test strip, USE TO TEST BLOOD GLUCOSE FOUR TIMES DAILY, Disp: 300 each, Rfl: 3 .  Green Tea, Camillia sinensis, (GREEN TEA PO), Take by mouth as needed. Reported on 12/16/2015, Disp: , Rfl:  .  Lactobacillus (ACIDOPHILUS PROBIOTIC PO), Take 1 tablet by mouth daily., Disp: , Rfl:  .  levocetirizine (XYZAL) 5 MG tablet, Take 1 tablet (5 mg total) by mouth every evening., Disp: 30 tablet, Rfl: 1 .  metFORMIN (GLUCOPHAGE-XR) 500 MG 24 hr tablet, TAKE 2 TABLETS BY MOUTH TWICE A DAY, Disp: 360 tablet, Rfl: 0 .  montelukast (SINGULAIR) 10 MG tablet, Take 1 tablet (10 mg total) by mouth at bedtime., Disp: 90 tablet, Rfl: 1 .  nystatin (MYCOSTATIN) 100000 UNIT/ML suspension, Swish and Swallow 5 mL 4 times daily (Patient not taking: Reported on 05/16/2019), Disp: 60 mL, Rfl: 1 .  Omega-3 Fatty Acids (OMEGA 3 PO), Take 1 tablet by mouth as needed., Disp: , Rfl:  .  omeprazole (PRILOSEC) 40 MG capsule, Take 40 mg by mouth as needed. , Disp: , Rfl:  .  Polyethyl Glycol-Propyl Glycol (SYSTANE FREE OP), Apply 1 drop to eye daily., Disp: , Rfl:  .  ramipril (ALTACE) 2.5 MG capsule, Take 1 capsule (2.5 mg total) by mouth daily., Disp: 90 capsule, Rfl: 1 .  simvastatin (ZOCOR) 20 MG tablet, Take 1 tablet (20 mg total) by mouth daily., Disp: 90 tablet, Rfl: 1 .  Spacer/Aero-Holding Chambers (AEROCHAMBER PLUS) inhaler, Use as instructed with MDI, Disp: 1 each, Rfl: 2 .  Stinging Nettle 2 % POWD, Take 1 tablet by mouth as needed. Reported on 12/16/2015, Disp: , Rfl:  .  traZODone (DESYREL) 50 MG tablet, TAKE 1/2 TO 1 TABLET BY MOUTH AT BEDTIME AS NEEDED FOR SLEEP, Disp: 90 tablet, Rfl: 1 .  TURMERIC PO,  Take 800 mg by mouth as needed. Reported on 12/16/2015, Disp: , Rfl:   EXAM:  VITALS per patient if applicable:  GENERAL: alert, oriented, appears well and in no acute distress  HEENT: atraumatic, conjunttiva clear, no obvious abnormalities on inspection of external nose and ears  NECK: normal movements of the head and neck  LUNGS: on inspection no signs of respiratory distress, breathing rate appears normal, no obvious gross SOB, gasping or wheezing  CV: no obvious cyanosis  MS: moves all visible extremities without noticeable abnormality  PSYCH/NEURO: pleasant and cooperative, no obvious depression or anxiety, speech and thought processing grossly intact  ASSESSMENT AND PLAN:  Discussed the following assessment and plan: 1. Muscle strain - Consistent with muscle strain.  Prescribe low-dose Flexeril.  She was advised that this medication may cause drowsiness.  Also advised to use a heating pad while resting.  Follow-up if no improvement in the next 2 or 3 days - cyclobenzaprine (FLEXERIL) 5 MG tablet; Take 1 tablet (5 mg total) by mouth 3 (three) times daily as needed for up to 15 doses for muscle spasms.  Dispense: 15 tablet; Refill: 1  2. Insomnia, unspecified type  - traZODone (DESYREL) 50 MG tablet; Take 0.5-1 tablets (25-50 mg total) by mouth at bedtime as needed. for sleep  Dispense: 90 tablet; Refill: 1  3. Diabetes mellitus without complication (HCC)  - ramipril (ALTACE) 2.5 MG capsule; Take 1 capsule (2.5 mg total) by mouth daily.  Dispense: 90 capsule; Refill: 1  4. Age-related osteoporosis without current pathological fracture  - alendronate (FOSAMAX) 70 MG tablet; TAKE 1 TABLET BY MOUTH 1  TIME A WEEK. TAKE WITH A  FULL GLASS OF WATER ON AN  EMPTY STOMACH.  Dispense: 12 tablet; Refill: 3     I discussed the assessment and treatment plan with the patient. The patient was provided an opportunity to ask questions and all were answered. The patient agreed with the  plan and demonstrated an understanding of the instructions.   The patient was advised to call back or seek an in-person evaluation if the symptoms worsen or if the condition fails to improve as anticipated.   [name], NP

## 2019-06-18 NOTE — Telephone Encounter (Signed)
-----   Message -----  From: Grace Blight Gabrys  Sent: 06/18/2019  1:20 PM EDT  To: [facility]  Subject: Non-Urgent Medical Question             I got a crook in my neck Saturday and it is now affecting my lower back. Walking is an issue and quick movement. It's like my sciatica problem is starting over. There are areas that are tender at times.   Please give her an office visit.

## 2019-06-18 NOTE — Telephone Encounter (Signed)
I have spoken with the patient. She has discussed her symptoms with her PCP who has prescribed cyclobenzaprine 5mg .  She is also going to try using a heating pain to the areas of discomfort.  She knows to be careful to avoid skin burns with heating pads.  She would like to postpone her appt for several weeks to see if her symptoms will resolve with the above treatment plan.  She scheduled a follow up with Dr. Krista Blue on 07/10/2019.  She will call back if she feels she needs an earlier date.

## 2019-06-19 ENCOUNTER — Ambulatory Visit: Payer: Medicare HMO | Admitting: Allergy

## 2019-06-19 ENCOUNTER — Encounter: Payer: Self-pay | Admitting: Allergy

## 2019-06-19 ENCOUNTER — Other Ambulatory Visit: Payer: Self-pay

## 2019-06-19 VITALS — BP 110/70 | HR 71 | Temp 98.3°F | Resp 16 | Ht 64.0 in | Wt 129.8 lb

## 2019-06-19 DIAGNOSIS — J4540 Moderate persistent asthma, uncomplicated: Secondary | ICD-10-CM

## 2019-06-19 DIAGNOSIS — K219 Gastro-esophageal reflux disease without esophagitis: Secondary | ICD-10-CM | POA: Diagnosis not present

## 2019-06-19 DIAGNOSIS — J3489 Other specified disorders of nose and nasal sinuses: Secondary | ICD-10-CM

## 2019-06-19 DIAGNOSIS — J3089 Other allergic rhinitis: Secondary | ICD-10-CM

## 2019-06-19 DIAGNOSIS — T7800XD Anaphylactic reaction due to unspecified food, subsequent encounter: Secondary | ICD-10-CM

## 2019-06-19 MED ORDER — IPRATROPIUM BROMIDE 0.03 % NA SOLN: 2.0000 | Freq: Three times a day (TID) | NASAL | 5 refills | Status: DC | PRN

## 2019-06-19 NOTE — Assessment & Plan Note (Signed)
Past history - Felt better with [facility] but could not afford to purchase after samples ran out. Interim history - Currently on Advair due to cost but noticed that with both Advair and Symbicort some throat soreness after using it a few days in a row despite using spacer and rinsing mouth afterwards.  Today's spirometry did not show any abnormalities. ? Daily controller medication(s):stop Advair and monitor symptoms as she complains of sore throat and cost of inhaler.  ? If symptoms worsen advised her to restart Advair 115 2 puffs BID with spacer and rinse mouth afterwards. ? Continue Singulair 10mg  daily.   Prior to physical activity:May use albuterol rescue inhaler 2 puffs 5 to 15 minutes prior to strenuous physical activities.  Rescue medications:May use albuterol rescue inhaler 2 puffs or nebulizer every 4 to 6 hours as needed for shortness of breath, chest tightness, coughing, and wheezing. Monitor frequency of use.

## 2019-06-19 NOTE — Assessment & Plan Note (Addendum)
Currently on allergy immunotherapy for tree/grass/weed/feather/cockroach/cat/dog/mold/dust mites since 2016. Interim history - complaining of nasal congestion and rhinorrhea.   Patient has nasal septal perforation noted on exam today. This is most likely contributing to her rhinitis symptoms. History of prior nasal surgery as well. No recent ENT evaluation.  Refer to ENT.  Continue environmental control measures.  Discussed that using the antihistamines with the decongestant for long term is not recommended as it can cause hypertension and rebound effects.  May use over the counter antihistamines such as Zyrtec (cetirizine), Claritin (loratadine), Allegra (fexofenadine), or Xyzal (levocetirizine) daily as needed without the D.  Continue Flonase 1 spray twice a day. Demonstrated proper use.  Stop azelastine.  Start Atrovent 2 sprays in each nostril 3 times a day as needed for drainage.   Nasal saline spray (i.e., Simply Saline) or nasal saline lavage (i.e., NeilMed) is recommended as needed and prior to medicated nasal sprays.  Continue montelukast 10mg  daily.

## 2019-06-19 NOTE — Assessment & Plan Note (Signed)
Interim history - No accidental ingestions. Patient did not pick up Epipen due to cost.   Continue to avoid peanuts, cow's milk, soybean.  For mild symptoms you can take over the counter antihistamines such as Benadryl and monitor symptoms closely. If symptoms worsen or if you have severe symptoms including breathing issues, throat closure, significant swelling, whole body hives, severe diarrhea and vomiting, lightheadedness then seek immediate medical care.

## 2019-06-19 NOTE — Progress Notes (Signed)
Follow Up Note  RE: Kimberly Lam MRN: [id number] DOB: 1946/10/18 Date of Office Visit: 06/19/2019  Referring provider: [name], NP Primary care provider: [name], NP  Chief Complaint: Follow-up (asthma, issues with inhaler irritating her throat)  History of Present Illness: I had the pleasure of seeing Kimberly Lam for a follow up visit at the [facility] on 06/19/2019. She is a 73 y.o. female, who is being followed for asthma, allergic rhino conjunctivitis, food allergy. Today she is here for regular follow up visit. Her previous allergy office visit was on 03/20/2019 with Dr. Maudie Mercury.   Moderate persistent asthma, uncomplicated Patient has been issues with sore throat after using Symbicort and now on Advair which is causing the same problem.  Denies any SOB, coughing, wheezing, chest tightness, nocturnal awakenings, ER/urgent care visits or prednisone use since the last visit.  She feels like her breathing issues is mainly due to her nasal congestion and not her lungs.  Perennial and seasonal allergic rhinoconjunctivitis Currently on Flonase 2 sprays QHS, azelastine 2 sprays QAM but still having issues with nasal congestion, whistling through the nose and rhinorrhea. She is using saline rinses at times. Still taking montelukast daily and allegra daily. She thinks the antihistamines with the decongestants works better. No recent ENT evaluation.   Anaphylactic shock due to adverse food reaction Currently avoiding peanuts, cow's milk, soybean. No reactions since the last visit. Did not pick up Epipen due to cost.   Assessment and Plan: [name] is a 74 y.o. female with: Moderate persistent asthma, uncomplicated Past history - Felt better with [facility] but could not afford to purchase after samples ran out. Interim history - Currently on Advair due to cost but noticed that with both Advair and Symbicort some throat soreness after using it a few days in a row  despite using spacer and rinsing mouth afterwards.  Today's spirometry did not show any abnormalities. ? Daily controller medication(s):stop Advair and monitor symptoms as she complains of sore throat and cost of inhaler.  ? If symptoms worsen advised her to restart Advair 115 2 puffs BID with spacer and rinse mouth afterwards. ? Continue Singulair 54m daily.   Prior to physical activity:May use albuterol rescue inhaler 2 puffs 5 to 15 minutes prior to strenuous physical activities.  Rescue medications:May use albuterol rescue inhaler 2 puffs or nebulizer every 4 to 6 hours as needed for shortness of breath, chest tightness, coughing, and wheezing. Monitor frequency of use.   Perennial and seasonal allergic rhinoconjunctivitis Currently on allergy immunotherapy for tree/grass/weed/feather/cockroach/cat/dog/mold/dust mites since 2016. Interim history - complaining of nasal congestion and rhinorrhea.   Patient has nasal septal perforation noted on exam today. This is most likely contributing to her rhinitis symptoms. History of prior nasal surgery as well. No recent ENT evaluation.  Refer to ENT.  Continue environmental control measures.  Discussed that using the antihistamines with the decongestant for long term is not recommended as it can cause hypertension and rebound effects.  May use over the counter antihistamines such as Zyrtec (cetirizine), Claritin (loratadine), Allegra (fexofenadine), or Xyzal (levocetirizine) daily as needed without the D.  Continue Flonase 1 spray twice a day. Demonstrated proper use.  Stop azelastine.  Start Atrovent 2 sprays in each nostril 3 times a day as needed for drainage.   Nasal saline spray (i.e., Simply Saline) or nasal saline lavage (i.e., NeilMed) is recommended as needed and prior to medicated nasal sprays.  Continue montelukast 133mdaily.  Nasal septal perforation  Refer to ENT.  Demonstrated proper nasal spray use.    Gastroesophageal reflux disease  Continue taking omeprazole 46m daily.  Anaphylactic shock due to adverse food reaction Interim history - No accidental ingestions. Patient did not pick up Epipen due to cost.   Continue to avoid peanuts, cow's milk, soybean.  For mild symptoms you can take over the counter antihistamines such as Benadryl and monitor symptoms closely. If symptoms worsen or if you have severe symptoms including breathing issues, throat closure, significant swelling, whole body hives, severe diarrhea and vomiting, lightheadedness then seek immediate medical care.  Return in about 4 months (around 10/20/2019).  Meds ordered this encounter  Medications  . ipratropium (ATROVENT) 0.03 % nasal spray    Sig: Place 2 sprays into both nostrils 3 (three) times daily as needed for rhinitis (runny nose).    Dispense:  30 mL    Refill:  5   Diagnostics: Spirometry:  Tracings reviewed. Her effort: It was hard to get consistent efforts and there is a question as to whether this reflects a maximal maneuver. FVC: 2.11L FEV1: 1.78L, 97% predicted FEV1/FVC ratio: 84% Interpretation: No overt abnormalities noted given today's efforts.  Please see scanned spirometry results for details.  Medication List:  Current Outpatient Medications  Medication Sig Dispense Refill  . Accu-Chek FastClix Lancets MISC USE TO TEST BLOOD GLUCOSE 3-4 TIMES DAILY 360 each 3  . Alcohol Swabs (B-D SINGLE USE SWABS REGULAR) PADS USE TO TEST BLOOD GLUCOSE FOUR TIMES DAILY 200 each 3  . alendronate (FOSAMAX) 70 MG tablet TAKE 1 TABLET BY MOUTH 1  TIME A WEEK. TAKE WITH A  FULL GLASS OF WATER ON AN  EMPTY STOMACH. 12 tablet 3  . Ascorbic Acid (VITAMIN C) 1000 MG tablet Take 1,000 mg by mouth as needed.    .Jolyne LoaGrape-Goldenseal (BERBERINE COMPLEX PO) Take by mouth daily.    . Blood Glucose Monitoring Suppl (ACCU-CHEK AVIVA PLUS) w/Device KIT     . cholecalciferol (VITAMIN D) 1000 units tablet Take  1,000 Units by mouth daily.    .Marland KitchenCINNAMON PO Take 1 tablet by mouth as needed.    . Cranberry 450 MG CAPS Take 1 capsule by mouth as needed.    . Cyanocobalamin (VITAMIN B-12 CR) 1500 MCG TBCR Take 1 tablet by mouth as needed.    . cyclobenzaprine (FLEXERIL) 5 MG tablet Take 1 tablet (5 mg total) by mouth 3 (three) times daily as needed for up to 15 doses for muscle spasms. 15 tablet 1  . Echinacea-Goldenseal (ECHINACEA COMB/GOLDEN SEAL PO) Take 1 tablet by mouth as needed. Reported on 12/16/2015    . fluticasone (FLONASE) 50 MCG/ACT nasal spray Place 1 spray into both nostrils 2 (two) times daily. 48 g 1  . gabapentin (NEURONTIN) 300 MG capsule Take 1 capsule (300 mg total) by mouth 4 (four) times daily. 360 capsule 4  . glucose blood (ACCU-CHEK GUIDE) test strip USE TO TEST BLOOD GLUCOSE FOUR TIMES DAILY 300 each 3  . Green Tea, Camillia sinensis, (GREEN TEA PO) Take by mouth as needed. Reported on 12/16/2015    . Lactobacillus (ACIDOPHILUS PROBIOTIC PO) Take 1 tablet by mouth daily.    .Marland Kitchenlevocetirizine (XYZAL) 5 MG tablet Take 1 tablet (5 mg total) by mouth every evening. 30 tablet 1  . metFORMIN (GLUCOPHAGE-XR) 500 MG 24 hr tablet TAKE 2 TABLETS BY MOUTH TWICE A DAY 360 tablet 0  . montelukast (SINGULAIR) 10 MG tablet Take 1  tablet (10 mg total) by mouth at bedtime. 90 tablet 1  . Omega-3 Fatty Acids (OMEGA 3 PO) Take 1 tablet by mouth as needed.    Marland Kitchen omeprazole (PRILOSEC) 40 MG capsule Take 40 mg by mouth as needed.     Vladimir Faster Glycol-Propyl Glycol (SYSTANE FREE OP) Apply 1 drop to eye daily.    . ramipril (ALTACE) 2.5 MG capsule Take 1 capsule (2.5 mg total) by mouth daily. 90 capsule 1  . simvastatin (ZOCOR) 20 MG tablet Take 1 tablet (20 mg total) by mouth daily. 90 tablet 1  . Spacer/Aero-Holding Chambers (AEROCHAMBER PLUS) inhaler Use as instructed with MDI 1 each 2  . Stinging Nettle 2 % POWD Take 1 tablet by mouth as needed. Reported on 12/16/2015    . traZODone (DESYREL) 50 MG  tablet Take 0.5-1 tablets (25-50 mg total) by mouth at bedtime as needed. for sleep 90 tablet 1  . TURMERIC PO Take 800 mg by mouth as needed. Reported on 12/16/2015    . ipratropium (ATROVENT) 0.03 % nasal spray Place 2 sprays into both nostrils 3 (three) times daily as needed for rhinitis (runny nose). 30 mL 5  . nystatin (MYCOSTATIN) 100000 UNIT/ML suspension Swish and Swallow 5 mL 4 times daily (Patient not taking: Reported on 06/19/2019) 60 mL 1   No current facility-administered medications for this visit.    Allergies: Allergies  Allergen Reactions  . Peanut-Containing Drug Products Anaphylaxis  . Milk-Related Compounds Nausea And Vomiting    Milk and cheese  . Actos [Pioglitazone] Nausea Only  . Soy Allergy Nausea Only    Pt states had allergy testing and was told allergic to soy-causes nausea but no other reactions  . Vicodin [Hydrocodone-Acetaminophen] Nausea Only   I reviewed her past medical history, social history, family history, and environmental history and no significant changes have been reported from previous visit on 03/20/2019.  Review of Systems  Constitutional: Negative for appetite change, chills, fever and unexpected weight change.  HENT: Positive for congestion. Negative for rhinorrhea.   Eyes: Negative for itching.  Respiratory: Negative for cough, chest tightness, shortness of breath and wheezing.   Gastrointestinal: Negative for abdominal pain.  Skin: Negative for rash.  Allergic/Immunologic: Positive for environmental allergies and food allergies.  Neurological: Negative for headaches.   Objective: BP 110/70 (BP Location: Right Arm, Patient Position: Sitting)   Pulse 71   Temp 98.3 F (36.8 C)   Resp 16   Ht _0  (1.626 m)   Wt 129 lb 12.8 oz (58.9 kg)   SpO2 99%   BMI 22.28 kg/m  Body mass index is 22.28 kg/m. Physical Exam  Constitutional: She is oriented to person, place, and time. She appears well-developed and well-nourished.  HENT:  Head:  Normocephalic and atraumatic.  Right Ear: External ear normal.  Left Ear: External ear normal.  Mouth/Throat: Oropharynx is clear and moist.  Septal perforation  Eyes: Conjunctivae and EOM are normal.  Neck: Neck supple.  Cardiovascular: Normal rate, regular rhythm and normal heart sounds. Exam reveals no gallop and no friction rub.  No murmur heard. Pulmonary/Chest: Effort normal and breath sounds normal. She has no wheezes. She has no rales.  Neurological: She is alert and oriented to person, place, and time.  Skin: Skin is warm. No rash noted.  Psychiatric: She has a normal mood and affect. Her behavior is normal.  Nursing note and vitals reviewed.  Previous notes and tests were reviewed. The plan was reviewed with the patient/family,  and all questions/concerned were addressed.  It was my pleasure to see Kimberly Lam today and participate in her care. Please feel free to contact me with any questions or concerns.  Sincerely,  Rexene Alberts, DO Allergy & Immunology  [facility] office: [phone] [facility] office: [phone]

## 2019-06-19 NOTE — Assessment & Plan Note (Signed)
   Refer to ENT.  Demonstrated proper nasal spray use.

## 2019-06-19 NOTE — Patient Instructions (Addendum)
Moderate persistent asthma, uncomplicated  Today's spirometry did not show any abnormalities. ? Daily controller medication(s):stop Advair and monitor symptoms. If you notice trouble with your breathing restart and let us know. ? Continue Singulair 10mg  daily.   Prior to physical activity:May use albuterol rescue inhaler 2 puffs 5 to 15 minutes prior to strenuous physical activities.  Rescue medications:May use albuterol rescue inhaler 2 puffs or nebulizer every 4 to 6 hours as needed for shortness of breath, chest tightness, coughing, and wheezing. Monitor frequency of use.  Asthma control goals:  Full participation in all desired activities (may need albuterol before activity) Albuterol use two times or less a week on average (not counting use with activity) Cough interfering with sleep two times or less a month Oral steroids no more than once a year No hospitalizations  Perennial and seasonal allergic rhinoconjunctivitis  Continue environmental control measures.  Discussed that using the antihistamines with the decongestant for long term is not recommended as it can cause hypertension and rebound effects.  May use over the counter antihistamines such as Zyrtec (cetirizine), Claritin (loratadine), Allegra (fexofenadine), or Xyzal (levocetirizine) daily as needed without the D.  Continue Flonase 1 spray twice a day. Demonstrated proper use.  Stop azelastine  Start Atrovent 2 sprays in each nostril 3 times a day as needed for drainage.   Nasal saline spray (i.e., Simply Saline) or nasal saline lavage (i.e., NeilMed) is recommended as needed and prior to medicated nasal sprays.  Continue montelukast 10mg  daily.   Nasal perforation  Refer to ENT  Anaphylactic shock due to adverse food reaction  Continue to avoid peanuts, cow's milk, soybean  I have prescribed epinephrine injectable and demonstrated proper use. For mild symptoms you can take over the counter antihistamines  such as Benadryl and monitor symptoms closely. If symptoms worsen or if you have severe symptoms including breathing issues, throat closure, significant swelling, whole body hives, severe diarrhea and vomiting, lightheadedness then inject epinephrine and seek immediate medical care afterwards  GERD  Start taking omeprazole 40mg  daily.  Follow up in 4 months

## 2019-06-19 NOTE — Assessment & Plan Note (Signed)
   Continue taking omeprazole 40mg  daily.

## 2019-06-24 ENCOUNTER — Other Ambulatory Visit: Payer: Self-pay

## 2019-06-24 ENCOUNTER — Ambulatory Visit: Payer: Self-pay

## 2019-06-24 NOTE — Patient Outreach (Signed)
[facility]) Care Management  06/24/2019  [name] [place] 29-Jul-1946 [id number]  BSW is assisting patient and her spouse with obtaining hearing aids through [place] [facility]. Patient reports that hearing test is scheduled at [facility] on 06/26/19. BSW is also assisting patient and spouse with getting eye glasses through [employer] They are [facility] and glasses.  Incoming call from [name] with [employer]  Unfortunately, [facility] is not able to accept referrals from the [employer] this month.  [name] reported that patient and her spouse are at the top of the list and can hopefully be scheduled in August.   BSW will follow up with patient about this and hearing test scheduled for 7/8 by the end of the week.  Ronn Melena, BSW Social Worker [phone]

## 2019-06-27 ENCOUNTER — Other Ambulatory Visit: Payer: Self-pay

## 2019-06-27 ENCOUNTER — Telehealth: Payer: Self-pay

## 2019-06-27 NOTE — Patient Outreach (Signed)
[facility]) Care Management  06/27/2019  Kimberly Lam [birth year] [id number]   BSW is assisting patient and her spouse with obtaining hearing aids through [place] [facility]. Hearing test was scheduled at [facility] for 06/26/19 but patient and spouse were not comfortable going due to current status of COVID19.   BSW is also assisting patient and spouse with getting eye glasses through [employer] They are required to utilize [facility] for the exam and glasses.  Incoming call from [employer] with [employer] on 06/24/19.  Unfortunately, [facility] is not able to accept referrals from the [employer] this month.  [name] reported that patient and her spouse are at the top of the list and can hopefully be scheduled in August.  BSW informed patient of this.  Will follow up with her again next month.  Ronn Melena, BSW Social Worker [phone]

## 2019-06-27 NOTE — Telephone Encounter (Signed)
-----   Message from Billie Lade, [place] sent at 06/19/2019 12:07 PM EDT ----- Regarding: ENT Referral Dr Maudie Mercury wants this patient referred to ENT for nasal septum perforation. I hope I put it in right for you guys.

## 2019-06-27 NOTE — Telephone Encounter (Signed)
Referral has been placed to [facility]

## 2019-06-28 ENCOUNTER — Ambulatory Visit: Payer: Self-pay

## 2019-07-03 ENCOUNTER — Ambulatory Visit (INDEPENDENT_AMBULATORY_CARE_PROVIDER_SITE_OTHER): Payer: Medicare HMO | Admitting: *Deleted

## 2019-07-03 DIAGNOSIS — J309 Allergic rhinitis, unspecified: Secondary | ICD-10-CM | POA: Diagnosis not present

## 2019-07-04 ENCOUNTER — Other Ambulatory Visit: Payer: Self-pay | Admitting: Adult Health

## 2019-07-05 ENCOUNTER — Other Ambulatory Visit: Payer: Self-pay

## 2019-07-05 MED ORDER — IPRATROPIUM BROMIDE 0.03 % NA SOLN: 2.0000 | Freq: Three times a day (TID) | NASAL | 1 refills | Status: DC | PRN

## 2019-07-05 NOTE — Telephone Encounter (Signed)
Yes, every 6 months

## 2019-07-09 MED ORDER — METFORMIN HCL ER 500 MG PO TB24: ORAL_TABLET | ORAL | 0 refills | Status: DC

## 2019-07-09 NOTE — Telephone Encounter (Signed)
Medication sent to the pharmacy by e-scribe.  2 wk supply sent to [facility] [address] per pt request.  Nothing further needed.

## 2019-07-10 ENCOUNTER — Other Ambulatory Visit: Payer: Self-pay

## 2019-07-10 ENCOUNTER — Ambulatory Visit (INDEPENDENT_AMBULATORY_CARE_PROVIDER_SITE_OTHER): Payer: Medicare HMO | Admitting: Neurology

## 2019-07-10 ENCOUNTER — Encounter: Payer: Self-pay | Admitting: Neurology

## 2019-07-10 VITALS — BP 118/58 | HR 84 | Temp 98.0°F | Ht 64.0 in | Wt 131.0 lb

## 2019-07-10 DIAGNOSIS — M5416 Radiculopathy, lumbar region: Secondary | ICD-10-CM

## 2019-07-10 DIAGNOSIS — G629 Polyneuropathy, unspecified: Secondary | ICD-10-CM | POA: Insufficient documentation

## 2019-07-10 DIAGNOSIS — G6289 Other specified polyneuropathies: Secondary | ICD-10-CM

## 2019-07-10 MED ORDER — GABAPENTIN 300 MG PO CAPS: 300.0000 mg | ORAL_CAPSULE | Freq: Four times a day (QID) | ORAL | 4 refills | Status: DC

## 2019-07-10 NOTE — Progress Notes (Signed)
PATIENT: Kimberly Lam DOB: [birth year]  Chief Complaint  Patient presents with  . Neck/Low Back Pain    Her neck pain improved with cyclobenzaprine and heat.  She is still having some discomfort in her low back especially with prolonged standing.       HISTORICAL  Kimberly Lam is a 73 years old female, seen in refer by her primary care nurse practitioner [name] for evaluation of leg cramping, tremors, initial evaluation was on January 23, 2018.    She has past medical history of hypertension, diabetes, hyperlipidemia  I saw her initially in 2016 for evaluation of low back pain, radiating pain to bilateral hip, bilateral leg muscle cramping, and weakness.  She has history of low back pain since 2006, midline low back, occasionally go to posterior thigh, go down below her knee, worsening with prolonged standing, or walking,  Previous her low back pain has responded very well to epidural injection, but the benefit gradually fades away, over the years, she complains of worsening low back pain, radiating pain to bilateral lower extremity again, in addition, she complains of bilateral feet paresthesia,   She had a history of diabetes, she denies gait difficulty, no bowel and bladder incontinence, she denies bilateral upper extremity paresthesia or weakness.   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   Note from Dr. Franciso Bend, [place], [place] in 2017, she was diagnosed with degenerative spondylolisthesis L4-L5, L5-S1, neurogenic claudication, low back pain, received epidural injection in Feb 2015, benefit lasted about one year  MRI of lumbar spine December 2016, There is a transitional L6/S1 vertebral body. 8 mm of anterolisthesis of L5 upon L6/S1 of a degenerative nature due to severe facet hypertrophy.  At this level, there is mild spinal stenosis and moderately severe right foraminal narrowing. There could be compression of the right L5 nerve root.  Degenerative changes at L6/S1-S2 lead to severe right foraminal narrowing that could lead to compression of the exiting right S1 nerve root  She continue complains of chronic low back pain, today she also complains of new onset right hand shaking, that was intermittent, getting worse when she holding utensils, writing, mild involvement on the left side, she continue complains of frequent lower extremity cramping especially on the right side, difficulty to walk in the morning.  There was no family history of tremor  UPDATE Nov 5th 2019: She was given prescription of gabapentin for her bilateral feet paresthesia, 300 mg 4 times a day does help her lower extremity cramps, but complains of daytime sleepiness, but she often woke up in the middle of the night complains of left muscle cramping, difficulty going to sleep,  We again personally reviewed MRI of lumbar spine in December 2016, transitional L6-S1 vertebral body, 8 mm of anterolisthesis of L5 upon S1 with severe facet hypertrophy, mild spinal stenosis, moderate severe right foraminal narrowing, potential compression of right L5 nerve roots,  UPDATE July 10 2019: She is overall doing much better, taking gabapentin 300 mg 2 tablets before bedtime, open skip daytime dose, epidural injection few months ago has helped her as well, she exercise regularly, denies gait abnormality, denies bowel and bladder incontinence  REVIEW OF SYSTEMS: Full 14 system review of systems performed and notable only for as above  ALLERGIES: Allergies  Allergen Reactions  . Peanut-Containing Drug Products Anaphylaxis  .  Milk-Related Compounds Nausea And Vomiting    Milk and cheese  . Actos [Pioglitazone] Nausea Only  . Soy Allergy Nausea Only    Pt states had allergy testing and was told allergic to soy-causes nausea but  no other reactions  . Vicodin [Hydrocodone-Acetaminophen] Nausea Only    HOME MEDICATIONS: Current Outpatient Medications  Medication Sig Dispense Refill  . Accu-Chek FastClix Lancets MISC USE TO TEST BLOOD GLUCOSE 3-4 TIMES DAILY 360 each 3  . Alcohol Swabs (B-D SINGLE USE SWABS REGULAR) PADS USE TO TEST BLOOD GLUCOSE FOUR TIMES DAILY 200 each 3  . alendronate (FOSAMAX) 70 MG tablet TAKE 1 TABLET BY MOUTH 1  TIME A WEEK. TAKE WITH A  FULL GLASS OF WATER ON AN  EMPTY STOMACH. 12 tablet 3  . Ascorbic Acid (VITAMIN C) 1000 MG tablet Take 1,000 mg by mouth as needed.    Jolyne Loa Grape-Goldenseal (BERBERINE COMPLEX PO) Take by mouth daily.    . Blood Glucose Monitoring Suppl (ACCU-CHEK AVIVA PLUS) w/Device KIT     . cholecalciferol (VITAMIN D) 1000 units tablet Take 1,000 Units by mouth daily.    Marland Kitchen CINNAMON PO Take 1 tablet by mouth as needed.    . Cranberry 450 MG CAPS Take 1 capsule by mouth as needed.    . Cyanocobalamin (VITAMIN B-12 CR) 1500 MCG TBCR Take 1 tablet by mouth as needed.    . cyclobenzaprine (FLEXERIL) 5 MG tablet Take 1 tablet (5 mg total) by mouth 3 (three) times daily as needed for up to 15 doses for muscle spasms. 15 tablet 1  . Echinacea-Goldenseal (ECHINACEA COMB/GOLDEN SEAL PO) Take 1 tablet by mouth as needed. Reported on 12/16/2015    . fluticasone (FLONASE) 50 MCG/ACT nasal spray Place 1 spray into both nostrils 2 (two) times daily. 48 g 1  . gabapentin (NEURONTIN) 300 MG capsule Take 1 capsule (300 mg total) by mouth 4 (four) times daily. 360 capsule 4  . glucose blood (ACCU-CHEK GUIDE) test strip USE TO TEST BLOOD GLUCOSE FOUR TIMES DAILY 300 each 3  . Green Tea, Camillia sinensis, (GREEN TEA PO) Take by mouth as needed. Reported on 12/16/2015    . ipratropium (ATROVENT) 0.03 % nasal spray Place 2 sprays into both nostrils 3 (three) times daily as needed for rhinitis (runny nose). 90 mL 1  . Lactobacillus (ACIDOPHILUS PROBIOTIC PO) Take 1 tablet by mouth  daily.    Marland Kitchen levocetirizine (XYZAL) 5 MG tablet Take 1 tablet (5 mg total) by mouth every evening. 30 tablet 1  . metFORMIN (GLUCOPHAGE-XR) 500 MG 24 hr tablet TAKE 2 TABLETS TWICE DAILY 360 tablet 0  . metFORMIN (GLUCOPHAGE-XR) 500 MG 24 hr tablet Take 2 tablets twice daily 56 tablet 0  . montelukast (SINGULAIR) 10 MG tablet Take 1 tablet (10 mg total) by mouth at bedtime. 90 tablet 1  . nystatin (MYCOSTATIN) 100000 UNIT/ML suspension Swish and Swallow 5 mL 4 times daily 60 mL 1  . Omega-3 Fatty Acids (OMEGA 3 PO) Take 1 tablet by mouth as needed.    Marland Kitchen omeprazole (PRILOSEC) 40 MG capsule Take 40 mg by mouth as needed.     Vladimir Faster Glycol-Propyl Glycol (SYSTANE FREE OP) Apply 1 drop to eye daily.    . ramipril (ALTACE) 2.5 MG capsule Take 1 capsule (2.5 mg total) by mouth daily. 90 capsule 1  . simvastatin (ZOCOR) 20 MG tablet Take 1 tablet (20 mg total) by mouth daily. 90 tablet 1  . Spacer/Aero-Holding Chambers (AEROCHAMBER PLUS)  inhaler Use as instructed with MDI 1 each 2  . Stinging Nettle 2 % POWD Take 1 tablet by mouth as needed. Reported on 12/16/2015    . traZODone (DESYREL) 50 MG tablet Take 0.5-1 tablets (25-50 mg total) by mouth at bedtime as needed. for sleep 90 tablet 1  . TURMERIC PO Take 800 mg by mouth as needed. Reported on 12/16/2015     No current facility-administered medications for this visit.     PAST MEDICAL HISTORY: Past Medical History:  Diagnosis Date  . Acid reflux   . Allergic rhinitis   . Anemia   . Asthma   . Bilateral sciatica   . Chronic bilateral low back pain   . Colon polyps   . Diverticulosis   . DM type 2 (diabetes mellitus, type 2) ([place])   . Environmental allergies    flowers, pollen, trees  . GERD (gastroesophageal reflux disease)   . Hyperlipidemia   . Low back pain   . Migraine     PAST SURGICAL HISTORY: Past Surgical History:  Procedure Laterality Date  . CATARACT EXTRACTION, BILATERAL Bilateral 2012  . CESAREAN SECTION     x 2   . DILATION AND CURETTAGE OF UTERUS    . NASAL SINUS SURGERY    . TUBAL LIGATION      FAMILY HISTORY: Family History  Problem Relation Age of Onset  . Asthma Father   . Emphysema Father   . Alcohol abuse Father   . Diabetes Mellitus II Brother        x 2  . Hyperlipidemia Brother   . Hypertension Brother   . Pancreatic cancer Brother   . Prostate cancer Brother   . Heart disease Maternal Grandmother        hardening of the arteries  . Heart murmur Daughter   . Glaucoma Brother   . Breast cancer Neg Hx     SOCIAL HISTORY:  Social History   Socioeconomic History  . Marital status: Married    Spouse name: Not on file  . Number of children: 2  . Years of education: 2  . Highest education level: Not on file  Occupational History  . Occupation: Retired  Scientific laboratory technician  . Financial resource strain: Not on file  . Food insecurity    Worry: Not on file    Inability: Not on file  . Transportation needs    Medical: Not on file    Non-medical: Not on file  Tobacco Use  . Smoking status: Never Smoker  . Smokeless tobacco: Never Used  Substance and Sexual Activity  . Alcohol use: No    Alcohol/week: 0.0 standard drinks  . Drug use: No  . Sexual activity: Not on file  Lifestyle  . Physical activity    Days per week: Not on file    Minutes per session: Not on file  . Stress: Not on file  Relationships  . Social Herbalist on phone: Not on file    Gets together: Not on file    Attends religious service: Not on file    Active member of club or organization: Not on file    Attends meetings of clubs or organizations: Not on file    Relationship status: Not on file  . Intimate partner violence    Fear of current or ex partner: Not on file    Emotionally abused: Not on file    Physically abused: Not on file    Forced  sexual activity: Not on file  Other Topics Concern  . Not on file  Social History Narrative   ** Merged History Encounter **       ** Data  from: 11/23/15 Enc Dept: [facility]   Married 1 son one daughter, relocated to [place] after living in [place] for many years. She is a Writer of [employer]. No caffeine.       ** Data from: 11/25/15 Enc Dept: [facility]   Lives at home with her husband and brother. Right-handed. No caffeine use.     PHYSICAL EXAM   Vitals:   07/10/19 1256  BP: (!) 118/58  Pulse: 84  Temp: 98 F (36.7 C)  Weight: 131 lb (59.4 kg)  Height: _0  (1.626 m)    Not recorded      Body mass index is 22.49 kg/m.  PHYSICAL EXAMNIATION:  Gen: NAD, conversant, well nourised, obese, well groomed                     Cardiovascular: Regular rate rhythm, no peripheral edema, warm, nontender. Eyes: Conjunctivae clear without exudates or hemorrhage Neck: Supple, no carotid bruits. Pulmonary: Clear to auscultation bilaterally   NEUROLOGICAL EXAM:  MENTAL STATUS: Speech:    Speech is normal; fluent and spontaneous with normal comprehension.  Cognition:     Orientation to time, place and person     Normal recent and remote memory     Normal Attention span and concentration     Normal Language, naming, repeating,spontaneous speech     Fund of knowledge   CRANIAL NERVES: CN II: Visual fields are full to confrontation. Pupils are round equal and briskly reactive to light. CN III, IV, VI: extraocular movement are normal. No ptosis. CN V: Facial sensation is intact to pinprick in all 3 divisions bilaterally. Corneal responses are intact.  CN VII: Face is symmetric with normal eye closure and smile. CN VIII: Hearing is normal to rubbing fingers CN IX, X: Palate elevates symmetrically. Phonation is normal. CN XI: Head turning and shoulder shrug are intact CN XII: Tongue is midline with normal movements and no atrophy.  MOTOR: There is no pronator drift of out-stretched arms. Muscle bulk and tone are normal. Muscle strength is normal.  REFLEXES: Reflexes  are 2+ and symmetric at the biceps, triceps, knees, and absent at ankles. Plantar responses are flexor.  SENSORY: Length dependent decreased to light touch, pinprick and vibratory sensation are intact in fingers and toes.  COORDINATION: Rapid alternating movements and fine finger movements are intact. There is no dysmetria on finger-to-nose and heel-knee-shin.    GAIT/STANCE: Stable, able to stand up on tiptoe and heel   DIAGNOSTIC DATA (LABS, IMAGING, TESTING) - I reviewed patient records, labs, notes, testing and imaging myself where available.   ASSESSMENT AND PLAN  Makyla Bye is a 73 y.o. female    Chronic low back pain, radiating pain to bilateral lower extremity             Consistent with lumbar stenosis             She has no significant gait difficulty, no evidence of distal weakness,            Partial response to gabapentin, frequent muscle cramping, wake her up from sleep,  Her symptoms has much improved after changing the schedule of gabapentin gabapentin to 300 mg 2 tablets  at nighttime,  She also has insulin-dependent  diabetes, evidence of diabetic peripheral neuropathy    Marcial Pacas, M.D. Ph.D.  [facility] [address] [place], [place] [address] Ph: [phone] Fax: [phone]  CC: Referring Provider

## 2019-07-16 ENCOUNTER — Ambulatory Visit (INDEPENDENT_AMBULATORY_CARE_PROVIDER_SITE_OTHER): Payer: Medicare HMO

## 2019-07-16 DIAGNOSIS — J309 Allergic rhinitis, unspecified: Secondary | ICD-10-CM | POA: Diagnosis not present

## 2019-07-23 ENCOUNTER — Other Ambulatory Visit: Payer: Self-pay | Admitting: *Deleted

## 2019-07-23 ENCOUNTER — Ambulatory Visit (INDEPENDENT_AMBULATORY_CARE_PROVIDER_SITE_OTHER): Payer: Medicare HMO | Admitting: *Deleted

## 2019-07-23 ENCOUNTER — Telehealth: Payer: Self-pay | Admitting: Neurology

## 2019-07-23 ENCOUNTER — Other Ambulatory Visit: Payer: Self-pay | Admitting: Neurology

## 2019-07-23 DIAGNOSIS — J309 Allergic rhinitis, unspecified: Secondary | ICD-10-CM

## 2019-07-23 MED ORDER — GABAPENTIN 300 MG PO CAPS: 300.0000 mg | ORAL_CAPSULE | Freq: Four times a day (QID) | ORAL | 3 refills | Status: DC

## 2019-07-23 MED ORDER — LEVOCETIRIZINE DIHYDROCHLORIDE 5 MG PO TABS: 5.0000 mg | ORAL_TABLET | Freq: Every evening | ORAL | 1 refills | Status: DC

## 2019-07-23 NOTE — Telephone Encounter (Signed)
Script refilled and sent to [facility].

## 2019-07-23 NOTE — Telephone Encounter (Signed)
Pt is needing a refill on her gabapentin (NEURONTIN) 300 MG capsule sent to [place]

## 2019-07-24 ENCOUNTER — Ambulatory Visit: Payer: Self-pay

## 2019-07-24 ENCOUNTER — Other Ambulatory Visit: Payer: Self-pay

## 2019-07-24 NOTE — Patient Outreach (Signed)
[facility]) Care Management  07/24/2019  Girtie Wiersma [place] 28-Apr-1946 [id number]   BSW is assisting patient and her spouse with obtaining hearing aids through [place] [facility]. [facility] was [facility] for 06/26/19 but patient and spouse were not comfortable going due to current status of COVID19.   BSW is also assisting patient and spouse with getting eye glasses through [employer] They are required to utilize [facility] for the exam and glasses. Incoming call from [employer] with [employer] on 06/24/19. Unfortunately, [facility] was not able to accept referrals from the [employer] during the month of July. [name] reported that patient and her spouse are at the top of the list and can hopefully be scheduled in August. BSW attempted to contact [name] today to follow up.  Left voicemail message.  Will follow up with patient when response is received.    Ronn Melena, BSW Social Worker [phone]

## 2019-07-25 ENCOUNTER — Telehealth: Payer: Self-pay | Admitting: Adult Health

## 2019-07-25 ENCOUNTER — Other Ambulatory Visit: Payer: Self-pay

## 2019-07-25 ENCOUNTER — Other Ambulatory Visit (INDEPENDENT_AMBULATORY_CARE_PROVIDER_SITE_OTHER): Payer: Medicare HMO

## 2019-07-25 ENCOUNTER — Ambulatory Visit: Payer: Self-pay

## 2019-07-25 ENCOUNTER — Other Ambulatory Visit: Payer: Self-pay | Admitting: Family Medicine

## 2019-07-25 DIAGNOSIS — E119 Type 2 diabetes mellitus without complications: Secondary | ICD-10-CM

## 2019-07-25 DIAGNOSIS — H938X3 Other specified disorders of ear, bilateral: Secondary | ICD-10-CM | POA: Diagnosis not present

## 2019-07-25 DIAGNOSIS — L299 Pruritus, unspecified: Secondary | ICD-10-CM | POA: Diagnosis not present

## 2019-07-25 DIAGNOSIS — H6123 Impacted cerumen, bilateral: Secondary | ICD-10-CM | POA: Diagnosis not present

## 2019-07-25 DIAGNOSIS — J343 Hypertrophy of nasal turbinates: Secondary | ICD-10-CM | POA: Diagnosis not present

## 2019-07-25 DIAGNOSIS — J3489 Other specified disorders of nose and nasal sinuses: Secondary | ICD-10-CM | POA: Diagnosis not present

## 2019-07-25 LAB — HEMOGLOBIN A1C: Hgb A1c MFr Bld: 7.2 % — ABNORMAL HIGH (ref 4.6–6.5)

## 2019-07-25 NOTE — Telephone Encounter (Signed)
Patient is asking if she could get a call back letting her know if her blood type is in her chart from previous surgeries, and if not she is interested in having her blood type done.

## 2019-07-26 ENCOUNTER — Ambulatory Visit (INDEPENDENT_AMBULATORY_CARE_PROVIDER_SITE_OTHER): Payer: Medicare HMO | Admitting: Adult Health

## 2019-07-26 ENCOUNTER — Encounter: Payer: Self-pay | Admitting: Adult Health

## 2019-07-26 DIAGNOSIS — E119 Type 2 diabetes mellitus without complications: Secondary | ICD-10-CM

## 2019-07-26 NOTE — Progress Notes (Signed)
Virtual Visit via Video Note  I connected with Kimberly Lam on 07/26/19 at 11:00 AM EDT by a video enabled telemedicine application and verified that I am speaking with the correct person using two identifiers.  Location patient: home Location provider:work or home office Persons participating in the virtual visit: patient, provider  I discussed the limitations of evaluation and management by telemedicine and the availability of in person appointments. The patient expressed understanding and agreed to proceed.   HPI:  73 year old female who is being evaluated today for follow-up regarding diabetes mellitus.  Her last follow-up was in February 2020 at which time her A1c was 6.5.  She is currently maintained on metformin extended release 1000 mg twice daily.  Today she reports that she has been indulging in desserts more than she has in the past.  Her blood sugars have been slightly elevated.  Overall she is eating healthy staying active.  Her A1c today is 7.2  ROS: See pertinent positives and negatives per HPI.  Past Medical History:  Diagnosis Date  . Acid reflux   . Allergic rhinitis   . Anemia   . Asthma   . Bilateral sciatica   . Chronic bilateral low back pain   . Colon polyps   . Diverticulosis   . DM type 2 (diabetes mellitus, type 2) ([place])   . Environmental allergies    flowers, pollen, trees  . GERD (gastroesophageal reflux disease)   . Hyperlipidemia   . Low back pain   . Migraine     Past Surgical History:  Procedure Laterality Date  . CATARACT EXTRACTION, BILATERAL Bilateral 2012  . CESAREAN SECTION     x 2  . DILATION AND CURETTAGE OF UTERUS    . NASAL SINUS SURGERY    . TUBAL LIGATION      Family History  Problem Relation Age of Onset  . Asthma Father   . Emphysema Father   . Alcohol abuse Father   . Diabetes Mellitus II Brother        x 2  . Hyperlipidemia Brother   . Hypertension Brother   . Pancreatic cancer Brother   . Prostate cancer Brother    . Heart disease Maternal Grandmother        hardening of the arteries  . Heart murmur Daughter   . Glaucoma Brother   . Breast cancer Neg Hx       Current Outpatient Medications:  .  Accu-Chek FastClix Lancets MISC, USE TO TEST BLOOD GLUCOSE 3-4 TIMES DAILY, Disp: 360 each, Rfl: 3 .  Alcohol Swabs (B-D SINGLE USE SWABS REGULAR) PADS, USE TO TEST BLOOD GLUCOSE FOUR TIMES DAILY, Disp: 200 each, Rfl: 3 .  alendronate (FOSAMAX) 70 MG tablet, TAKE 1 TABLET BY MOUTH 1  TIME A WEEK. TAKE WITH A  FULL GLASS OF WATER ON AN  EMPTY STOMACH., Disp: 12 tablet, Rfl: 3 .  Ascorbic Acid (VITAMIN C) 1000 MG tablet, Take 1,000 mg by mouth as needed., Disp: , Rfl:  .  Barberry-Oreg Grape-Goldenseal (BERBERINE COMPLEX PO), Take by mouth daily., Disp: , Rfl:  .  Blood Glucose Monitoring Suppl (ACCU-CHEK AVIVA PLUS) w/Device KIT, , Disp: , Rfl:  .  cholecalciferol (VITAMIN D) 1000 units tablet, Take 1,000 Units by mouth daily., Disp: , Rfl:  .  CINNAMON PO, Take 1 tablet by mouth as needed., Disp: , Rfl:  .  Cranberry 450 MG CAPS, Take 1 capsule by mouth as needed., Disp: , Rfl:  .  Cyanocobalamin (  VITAMIN B-12 CR) 1500 MCG TBCR, Take 1 tablet by mouth as needed., Disp: , Rfl:  .  cyclobenzaprine (FLEXERIL) 5 MG tablet, Take 1 tablet (5 mg total) by mouth 3 (three) times daily as needed for up to 15 doses for muscle spasms., Disp: 15 tablet, Rfl: 1 .  Echinacea-Goldenseal (ECHINACEA COMB/GOLDEN SEAL PO), Take 1 tablet by mouth as needed. Reported on 12/16/2015, Disp: , Rfl:  .  fluticasone (FLONASE) 50 MCG/ACT nasal spray, Place 1 spray into both nostrils 2 (two) times daily., Disp: 48 g, Rfl: 1 .  gabapentin (NEURONTIN) 300 MG capsule, Take 1 capsule (300 mg total) by mouth 4 (four) times daily., Disp: 360 capsule, Rfl: 3 .  glucose blood (ACCU-CHEK GUIDE) test strip, USE TO TEST BLOOD GLUCOSE FOUR TIMES DAILY, Disp: 300 each, Rfl: 3 .  Green Tea, Camillia sinensis, (GREEN TEA PO), Take by mouth as needed.  Reported on 12/16/2015, Disp: , Rfl:  .  ipratropium (ATROVENT) 0.03 % nasal spray, Place 2 sprays into both nostrils 3 (three) times daily as needed for rhinitis (runny nose)., Disp: 90 mL, Rfl: 1 .  Lactobacillus (ACIDOPHILUS PROBIOTIC PO), Take 1 tablet by mouth daily., Disp: , Rfl:  .  levocetirizine (XYZAL) 5 MG tablet, Take 1 tablet (5 mg total) by mouth every evening., Disp: 90 tablet, Rfl: 1 .  metFORMIN (GLUCOPHAGE-XR) 500 MG 24 hr tablet, TAKE 2 TABLETS TWICE DAILY, Disp: 360 tablet, Rfl: 0 .  metFORMIN (GLUCOPHAGE-XR) 500 MG 24 hr tablet, Take 2 tablets twice daily, Disp: 56 tablet, Rfl: 0 .  montelukast (SINGULAIR) 10 MG tablet, Take 1 tablet (10 mg total) by mouth at bedtime., Disp: 90 tablet, Rfl: 1 .  nystatin (MYCOSTATIN) 100000 UNIT/ML suspension, Swish and Swallow 5 mL 4 times daily, Disp: 60 mL, Rfl: 1 .  Omega-3 Fatty Acids (OMEGA 3 PO), Take 1 tablet by mouth as needed., Disp: , Rfl:  .  omeprazole (PRILOSEC) 40 MG capsule, Take 40 mg by mouth as needed. , Disp: , Rfl:  .  Polyethyl Glycol-Propyl Glycol (SYSTANE FREE OP), Apply 1 drop to eye daily., Disp: , Rfl:  .  ramipril (ALTACE) 2.5 MG capsule, Take 1 capsule (2.5 mg total) by mouth daily., Disp: 90 capsule, Rfl: 1 .  simvastatin (ZOCOR) 20 MG tablet, Take 1 tablet (20 mg total) by mouth daily., Disp: 90 tablet, Rfl: 1 .  Spacer/Aero-Holding Chambers (AEROCHAMBER PLUS) inhaler, Use as instructed with MDI, Disp: 1 each, Rfl: 2 .  Stinging Nettle 2 % POWD, Take 1 tablet by mouth as needed. Reported on 12/16/2015, Disp: , Rfl:  .  traZODone (DESYREL) 50 MG tablet, Take 0.5-1 tablets (25-50 mg total) by mouth at bedtime as needed. for sleep, Disp: 90 tablet, Rfl: 1 .  TURMERIC PO, Take 800 mg by mouth as needed. Reported on 12/16/2015, Disp: , Rfl:   EXAM:  VITALS per patient if applicable:  GENERAL: alert, oriented, appears well and in no acute distress  HEENT: atraumatic, conjunttiva clear, no obvious abnormalities on  inspection of external nose and ears  NECK: normal movements of the head and neck  LUNGS: on inspection no signs of respiratory distress, breathing rate appears normal, no obvious gross SOB, gasping or wheezing  CV: no obvious cyanosis  MS: moves all visible extremities without noticeable abnormality  PSYCH/NEURO: pleasant and cooperative, no obvious depression or anxiety, speech and thought processing grossly intact  ASSESSMENT AND PLAN:  Discussed the following assessment and plan:  1. Diabetes mellitus without complication (  [place]) -A1c is up slightly to 7.2.  There to be no change in medications at this time.  She was advised to work on cutting back on desserts and sugary snacks.  We will follow-up this fall for her CPE and we will retest then.     I discussed the assessment and treatment plan with the patient. The patient was provided an opportunity to ask questions and all were answered. The patient agreed with the plan and demonstrated an understanding of the instructions.   The patient was advised to call back or seek an in-person evaluation if the symptoms worsen or if the condition fails to improve as anticipated.   [name], NP

## 2019-07-26 NOTE — Addendum Note (Signed)
Addended by: Apolinar Junes on: 07/26/2019 11:29 AM   Modules accepted: Level of Service

## 2019-07-30 ENCOUNTER — Other Ambulatory Visit: Payer: Self-pay

## 2019-07-30 ENCOUNTER — Ambulatory Visit: Payer: Self-pay

## 2019-07-30 ENCOUNTER — Ambulatory Visit (INDEPENDENT_AMBULATORY_CARE_PROVIDER_SITE_OTHER): Payer: Medicare HMO | Admitting: *Deleted

## 2019-07-30 DIAGNOSIS — J309 Allergic rhinitis, unspecified: Secondary | ICD-10-CM

## 2019-07-30 NOTE — Patient Outreach (Signed)
[facility]) Care Management  07/30/2019  Kimberly Lam [birth year] [id number]   BSW is assisting patient and her spouse with obtaining hearing aids through [place] [facility] and Hard of Hearing. [facility] but patient and spouse were not comfortable going due to current status of COVID19. BSW is also assisting patient and spouse with getting eye glasses through [employer]. They are required to utilize [facility] for the exam and glasses.Incoming call fromJim Birdwell with [employer] on 06/24/19. Unfortunately, [facility] was not able to accept referrals from the [employer] during the month of July. [name] reported that patient and her spouse are at the top of the list and can hopefully be scheduled in August.BSW attempted to contact [name] on 07/24/19 to follow up but had to leave voicemail message.   Received return call from [name].  [facility] is still not accepting referrals from [place] at this time.  BSW will follow up with [name] again next month. BSW attempted to contact patient today to provide update but had to leave voicemail message.   Will contact her again next month after update from [name].  Ronn Melena, BSW Social Worker [phone]

## 2019-08-06 ENCOUNTER — Ambulatory Visit (INDEPENDENT_AMBULATORY_CARE_PROVIDER_SITE_OTHER): Payer: Medicare HMO | Admitting: *Deleted

## 2019-08-06 DIAGNOSIS — J309 Allergic rhinitis, unspecified: Secondary | ICD-10-CM

## 2019-08-13 ENCOUNTER — Ambulatory Visit (INDEPENDENT_AMBULATORY_CARE_PROVIDER_SITE_OTHER): Payer: Medicare HMO | Admitting: *Deleted

## 2019-08-13 DIAGNOSIS — J309 Allergic rhinitis, unspecified: Secondary | ICD-10-CM

## 2019-08-16 ENCOUNTER — Other Ambulatory Visit: Payer: Self-pay

## 2019-08-16 NOTE — Patient Outreach (Signed)
[facility]) Care Management  08/16/2019   Kimberly Lam 1946-06-23 [id number]  Subjective: Successful outreach. The patient was verified with two identifiers. The patient states that she is doing fine.  She denies any pain or falls.  She states her blood sugar this morning was 133.  She had an office visit with [name] and her a1c went up from a 6.8 to 7.2. She states she understands why her A1c went up she has been "splurging"  eating cake and other sweets.  She has verbalized that she will cut down on the sweets.  She reports that she continues to exercise and she takes her medications as prescribed.  She will follow up on her diabetes in 6 months.  Current Medications:  Current Outpatient Medications  Medication Sig Dispense Refill  . Accu-Chek FastClix Lancets MISC USE TO TEST BLOOD GLUCOSE 3-4 TIMES DAILY 360 each 3  . Alcohol Swabs (B-D SINGLE USE SWABS REGULAR) PADS USE TO TEST BLOOD GLUCOSE FOUR TIMES DAILY 200 each 3  . alendronate (FOSAMAX) 70 MG tablet TAKE 1 TABLET BY MOUTH 1  TIME A WEEK. TAKE WITH A  FULL GLASS OF WATER ON AN  EMPTY STOMACH. 12 tablet 3  . Ascorbic Acid (VITAMIN C) 1000 MG tablet Take 1,000 mg by mouth as needed.    Jolyne Loa Grape-Goldenseal (BERBERINE COMPLEX PO) Take by mouth daily.    . Blood Glucose Monitoring Suppl (ACCU-CHEK AVIVA PLUS) w/Device KIT     . cholecalciferol (VITAMIN D) 1000 units tablet Take 1,000 Units by mouth daily.    Marland Kitchen CINNAMON PO Take 1 tablet by mouth as needed.    . Cranberry 450 MG CAPS Take 1 capsule by mouth as needed.    . Cyanocobalamin (VITAMIN B-12 CR) 1500 MCG TBCR Take 1 tablet by mouth as needed.    . Echinacea-Goldenseal (ECHINACEA COMB/GOLDEN SEAL PO) Take 1 tablet by mouth as needed. Reported on 12/16/2015    . fluticasone (FLONASE) 50 MCG/ACT nasal spray Place 1 spray into both nostrils 2 (two) times daily. 48 g 1  . gabapentin (NEURONTIN) 300 MG capsule Take 1 capsule (300 mg total)  by mouth 4 (four) times daily. 360 capsule 3  . glucose blood (ACCU-CHEK GUIDE) test strip USE TO TEST BLOOD GLUCOSE FOUR TIMES DAILY 300 each 3  . Green Tea, Camillia sinensis, (GREEN TEA PO) Take by mouth as needed. Reported on 12/16/2015    . ipratropium (ATROVENT) 0.03 % nasal spray Place 2 sprays into both nostrils 3 (three) times daily as needed for rhinitis (runny nose). 90 mL 1  . Lactobacillus (ACIDOPHILUS PROBIOTIC PO) Take 1 tablet by mouth daily.    Marland Kitchen levocetirizine (XYZAL) 5 MG tablet Take 1 tablet (5 mg total) by mouth every evening. 90 tablet 1  . metFORMIN (GLUCOPHAGE-XR) 500 MG 24 hr tablet TAKE 2 TABLETS TWICE DAILY 360 tablet 0  . montelukast (SINGULAIR) 10 MG tablet Take 1 tablet (10 mg total) by mouth at bedtime. 90 tablet 1  . nystatin (MYCOSTATIN) 100000 UNIT/ML suspension Swish and Swallow 5 mL 4 times daily 60 mL 1  . Omega-3 Fatty Acids (OMEGA 3 PO) Take 1 tablet by mouth as needed.    Marland Kitchen omeprazole (PRILOSEC) 40 MG capsule Take 40 mg by mouth as needed.     Vladimir Faster Glycol-Propyl Glycol (SYSTANE FREE OP) Apply 1 drop to eye daily.    . ramipril (ALTACE) 2.5 MG capsule Take 1 capsule (2.5 mg total) by mouth daily.  90 capsule 1  . simvastatin (ZOCOR) 20 MG tablet Take 1 tablet (20 mg total) by mouth daily. 90 tablet 1  . Stinging Nettle 2 % POWD Take 1 tablet by mouth as needed. Reported on 12/16/2015    . traZODone (DESYREL) 50 MG tablet Take 0.5-1 tablets (25-50 mg total) by mouth at bedtime as needed. for sleep 90 tablet 1  . Spacer/Aero-Holding Chambers (AEROCHAMBER PLUS) inhaler Use as instructed with MDI 1 each 2  . TURMERIC PO Take 800 mg by mouth as needed. Reported on 12/16/2015     No current facility-administered medications for this visit.     Functional Status:  In your present state of health, do you have any difficulty performing the following activities: 04/11/2019  Hearing? Y  Comment sounds like things said muffled  Vision? N  Difficulty  concentrating or making decisions? N  Walking or climbing stairs? Y  Comment kness hurt her when she has to climb stairs  Dressing or bathing? N  Doing errands, shopping? N  Some recent data might be hidden    Fall/Depression Screening: Fall Risk  08/16/2019 05/16/2019 04/11/2019  Falls in the past year? 0 0 0  Number falls in past yr: - - -  Injury with Fall? - - -   PHQ 2/9 Scores 04/11/2019 03/12/2019 10/18/2018 06/13/2017  PHQ - 2 Score 0 0 0 0    Assessment: Patient will continue to benefit from health coach outreach for disease management and support.  THN CM Care Plan Problem One     Most Recent Value  THN Long Term Goal   In 90 days the patient will maintain her a1c around 7.2  THN Long Term Goal Start Date  08/16/19  Interventions for Problem One Long Term Goal  Discussed FBS, reviewed diet and talked about "splurges" she has had with sweets. encouraged healthier food choices, encouraged continued exercise and continue keeping doctors appointments.     Plan: RN Health Coach will contact patient in the month of November and patient agrees to next outreach.   Lazaro Arms RN, BSN, [place] Direct Dial:  [phone]  Fax: [phone]

## 2019-08-28 ENCOUNTER — Ambulatory Visit (INDEPENDENT_AMBULATORY_CARE_PROVIDER_SITE_OTHER): Payer: Medicare Other | Admitting: *Deleted

## 2019-08-28 DIAGNOSIS — J309 Allergic rhinitis, unspecified: Secondary | ICD-10-CM

## 2019-09-02 ENCOUNTER — Ambulatory Visit: Payer: Self-pay

## 2019-09-02 ENCOUNTER — Other Ambulatory Visit: Payer: Self-pay

## 2019-09-02 NOTE — Patient Outreach (Signed)
[facility]) Care Management  09/02/2019  Kimberly Lam Aug 08, 1946 [id number]   BSW is assisting patient and her spouse with obtaining hearing aids through [place] [facility] the [facility] and Hard of Hearing. [facility] but patient and spouse were not comfortable going due to current status of COVID19. BSW is also assisting patient and spouse with getting eye glasses through [employer] They are required to utilize [facility] for the exam and glasses.[facility] is still not accepting referrals from [place] at this time.  [name] did report that patient and her spouse are on the top of the list to receive assistance when [facility] starts taking new patient's again.  Will follow up with [name] again next month. Attempted to contact patient today to provide update and to inquire if she still wants to pursue assistance for hearing aid.  Left voicemail message.   Will contact her again next month after update from [name].  Ronn Melena, BSW Social Worker [phone]

## 2019-09-10 ENCOUNTER — Ambulatory Visit (INDEPENDENT_AMBULATORY_CARE_PROVIDER_SITE_OTHER): Payer: Medicare Other | Admitting: *Deleted

## 2019-09-10 DIAGNOSIS — J309 Allergic rhinitis, unspecified: Secondary | ICD-10-CM | POA: Diagnosis not present

## 2019-09-19 ENCOUNTER — Encounter: Payer: Self-pay | Admitting: Family Medicine

## 2019-09-19 ENCOUNTER — Telehealth (INDEPENDENT_AMBULATORY_CARE_PROVIDER_SITE_OTHER): Payer: Medicare Other | Admitting: Family Medicine

## 2019-09-19 DIAGNOSIS — J019 Acute sinusitis, unspecified: Secondary | ICD-10-CM

## 2019-09-19 MED ORDER — AZITHROMYCIN 250 MG PO TABS: ORAL_TABLET | ORAL | 0 refills | Status: DC

## 2019-09-19 NOTE — Progress Notes (Signed)
Virtual Visit via Video Note  I connected with the patient on 09/19/19 at 10:30 AM EDT by a video enabled telemedicine application and verified that I am speaking with the correct person using two identifiers.  Location patient: home Location provider:work or home office Persons participating in the virtual visit: patient, provider  I discussed the limitations of evaluation and management by telemedicine and the availability of in person appointments. The patient expressed understanding and agreed to proceed.   HPI: Here for 2 days of stuffy head, PND, and blowing yellow mucus from the nose. No fever or headache or cough or SOB. No body aches or NVD. Taking her usual allergy medications.   ROS: See pertinent positives and negatives per HPI.  Past Medical History:  Diagnosis Date  . Acid reflux   . Allergic rhinitis   . Anemia   . Asthma   . Bilateral sciatica   . Chronic bilateral low back pain   . Colon polyps   . Diverticulosis   . DM type 2 (diabetes mellitus, type 2) ([place])   . Environmental allergies    flowers, pollen, trees  . GERD (gastroesophageal reflux disease)   . Hyperlipidemia   . Low back pain   . Migraine     Past Surgical History:  Procedure Laterality Date  . CATARACT EXTRACTION, BILATERAL Bilateral 2012  . CESAREAN SECTION     x 2  . DILATION AND CURETTAGE OF UTERUS    . NASAL SINUS SURGERY    . TUBAL LIGATION      Family History  Problem Relation Age of Onset  . Asthma Father   . Emphysema Father   . Alcohol abuse Father   . Diabetes Mellitus II Brother        x 2  . Hyperlipidemia Brother   . Hypertension Brother   . Pancreatic cancer Brother   . Prostate cancer Brother   . Heart disease Maternal Grandmother        hardening of the arteries  . Heart murmur Daughter   . Glaucoma Brother   . Breast cancer Neg Hx      Current Outpatient Medications:  .  Accu-Chek FastClix Lancets MISC, USE TO TEST BLOOD GLUCOSE 3-4 TIMES DAILY, Disp:  360 each, Rfl: 3 .  Alcohol Swabs (B-D SINGLE USE SWABS REGULAR) PADS, USE TO TEST BLOOD GLUCOSE FOUR TIMES DAILY, Disp: 200 each, Rfl: 3 .  alendronate (FOSAMAX) 70 MG tablet, TAKE 1 TABLET BY MOUTH 1  TIME A WEEK. TAKE WITH A  FULL GLASS OF WATER ON AN  EMPTY STOMACH., Disp: 12 tablet, Rfl: 3 .  Ascorbic Acid (VITAMIN C) 1000 MG tablet, Take 1,000 mg by mouth as needed., Disp: , Rfl:  .  azithromycin (ZITHROMAX) 250 MG tablet, As directed, Disp: 6 tablet, Rfl: 0 .  Barberry-Oreg Grape-Goldenseal (BERBERINE COMPLEX PO), Take by mouth daily., Disp: , Rfl:  .  Blood Glucose Monitoring Suppl (ACCU-CHEK AVIVA PLUS) w/Device KIT, , Disp: , Rfl:  .  cholecalciferol (VITAMIN D) 1000 units tablet, Take 1,000 Units by mouth daily., Disp: , Rfl:  .  CINNAMON PO, Take 1 tablet by mouth as needed., Disp: , Rfl:  .  Cranberry 450 MG CAPS, Take 1 capsule by mouth as needed., Disp: , Rfl:  .  Cyanocobalamin (VITAMIN B-12 CR) 1500 MCG TBCR, Take 1 tablet by mouth as needed., Disp: , Rfl:  .  Echinacea-Goldenseal (ECHINACEA COMB/GOLDEN SEAL PO), Take 1 tablet by mouth as needed. Reported on 12/16/2015,  Disp: , Rfl:  .  fluticasone (FLONASE) 50 MCG/ACT nasal spray, Place 1 spray into both nostrils 2 (two) times daily., Disp: 48 g, Rfl: 1 .  gabapentin (NEURONTIN) 300 MG capsule, Take 1 capsule (300 mg total) by mouth 4 (four) times daily., Disp: 360 capsule, Rfl: 3 .  glucose blood (ACCU-CHEK GUIDE) test strip, USE TO TEST BLOOD GLUCOSE FOUR TIMES DAILY, Disp: 300 each, Rfl: 3 .  Green Tea, Camillia sinensis, (GREEN TEA PO), Take by mouth as needed. Reported on 12/16/2015, Disp: , Rfl:  .  ipratropium (ATROVENT) 0.03 % nasal spray, Place 2 sprays into both nostrils 3 (three) times daily as needed for rhinitis (runny nose)., Disp: 90 mL, Rfl: 1 .  Lactobacillus (ACIDOPHILUS PROBIOTIC PO), Take 1 tablet by mouth daily., Disp: , Rfl:  .  levocetirizine (XYZAL) 5 MG tablet, Take 1 tablet (5 mg total) by mouth every  evening., Disp: 90 tablet, Rfl: 1 .  metFORMIN (GLUCOPHAGE-XR) 500 MG 24 hr tablet, TAKE 2 TABLETS TWICE DAILY, Disp: 360 tablet, Rfl: 0 .  montelukast (SINGULAIR) 10 MG tablet, Take 1 tablet (10 mg total) by mouth at bedtime., Disp: 90 tablet, Rfl: 1 .  nystatin (MYCOSTATIN) 100000 UNIT/ML suspension, Swish and Swallow 5 mL 4 times daily, Disp: 60 mL, Rfl: 1 .  Omega-3 Fatty Acids (OMEGA 3 PO), Take 1 tablet by mouth as needed., Disp: , Rfl:  .  omeprazole (PRILOSEC) 40 MG capsule, Take 40 mg by mouth as needed. , Disp: , Rfl:  .  Polyethyl Glycol-Propyl Glycol (SYSTANE FREE OP), Apply 1 drop to eye daily., Disp: , Rfl:  .  ramipril (ALTACE) 2.5 MG capsule, Take 1 capsule (2.5 mg total) by mouth daily., Disp: 90 capsule, Rfl: 1 .  simvastatin (ZOCOR) 20 MG tablet, Take 1 tablet (20 mg total) by mouth daily., Disp: 90 tablet, Rfl: 1 .  Spacer/Aero-Holding Chambers (AEROCHAMBER PLUS) inhaler, Use as instructed with MDI, Disp: 1 each, Rfl: 2 .  Stinging Nettle 2 % POWD, Take 1 tablet by mouth as needed. Reported on 12/16/2015, Disp: , Rfl:  .  traZODone (DESYREL) 50 MG tablet, Take 0.5-1 tablets (25-50 mg total) by mouth at bedtime as needed. for sleep, Disp: 90 tablet, Rfl: 1 .  TURMERIC PO, Take 800 mg by mouth as needed. Reported on 12/16/2015, Disp: , Rfl:   EXAM:  VITALS per patient if applicable:  GENERAL: alert, oriented, appears well and in no acute distress  HEENT: atraumatic, conjunttiva clear, no obvious abnormalities on inspection of external nose and ears  NECK: normal movements of the head and neck  LUNGS: on inspection no signs of respiratory distress, breathing rate appears normal, no obvious gross SOB, gasping or wheezing  CV: no obvious cyanosis  MS: moves all visible extremities without noticeable abnormality  PSYCH/NEURO: pleasant and cooperative, no obvious depression or anxiety, speech and thought processing grossly intact  ASSESSMENT AND PLAN: Sinusitis, treat  with a Zpack.  Alysia Penna, MD  Discussed the following assessment and plan:  No diagnosis found.     I discussed the assessment and treatment plan with the patient. The patient was provided an opportunity to ask questions and all were answered. The patient agreed with the plan and demonstrated an understanding of the instructions.   The patient was advised to call back or seek an in-person evaluation if the symptoms worsen or if the condition fails to improve as anticipated.

## 2019-09-20 ENCOUNTER — Other Ambulatory Visit: Payer: Self-pay

## 2019-09-24 ENCOUNTER — Ambulatory Visit (INDEPENDENT_AMBULATORY_CARE_PROVIDER_SITE_OTHER): Payer: Medicare Other

## 2019-09-24 DIAGNOSIS — J309 Allergic rhinitis, unspecified: Secondary | ICD-10-CM

## 2019-09-26 ENCOUNTER — Encounter: Payer: Self-pay | Admitting: Adult Health

## 2019-09-26 ENCOUNTER — Other Ambulatory Visit: Payer: Self-pay | Admitting: Adult Health

## 2019-09-26 ENCOUNTER — Other Ambulatory Visit: Payer: Self-pay | Admitting: Neurology

## 2019-09-26 DIAGNOSIS — E119 Type 2 diabetes mellitus without complications: Secondary | ICD-10-CM

## 2019-09-27 MED ORDER — ACCU-CHEK GUIDE VI STRP: ORAL_STRIP | 3 refills | Status: DC

## 2019-09-27 MED ORDER — SIMVASTATIN 20 MG PO TABS: 20.0000 mg | ORAL_TABLET | Freq: Every day | ORAL | 0 refills | Status: DC

## 2019-09-27 NOTE — Telephone Encounter (Signed)
DENIED.  RAMIPRIL FILLED ON 06/18/2019 FOR SIX MONTHS.  REQUEST IS EARLY.  OTHER MEDICATION SENT TO THE PHARMACY BY E-SCRIBE.

## 2019-10-01 NOTE — Progress Notes (Signed)
Vials exp 09-30-20

## 2019-10-02 MED ORDER — METFORMIN HCL ER 500 MG PO TB24: 1000.0000 mg | ORAL_TABLET | Freq: Two times a day (BID) | ORAL | 0 refills | Status: DC

## 2019-10-02 NOTE — Telephone Encounter (Signed)
Medication resent correctly to [facility].  Nothing further needed.

## 2019-10-02 NOTE — Addendum Note (Signed)
Addended by: Miles Costain T on: 10/02/2019 07:23 AM   Modules accepted: Orders

## 2019-10-03 DIAGNOSIS — J3089 Other allergic rhinitis: Secondary | ICD-10-CM | POA: Diagnosis not present

## 2019-10-04 ENCOUNTER — Other Ambulatory Visit: Payer: Self-pay

## 2019-10-04 NOTE — Patient Outreach (Signed)
[place] [facility]) Care Management  10/04/2019  Taiana Rexach [place] Mar 01, 1946 [id number]   Successful follow up call to patient today.   BSW is assisting patient and her spouse with obtaining hearing aids through [place] [facility] Services for the Deaf and Hard of Hearing. [facility] but patient and spouse were not comfortable going due to current status of COVID19. Talked with patient today to inquire if they are still interested in pursing this assistance.  Patient would still like to pursue assistance but has not rescheduled hearing test.  Provided her with contact information for [facility] again. BSW is also assisting patient and spouse with getting eye glasses through [employer] They are required to utilize [facility] for the exam and glasses.[facility] is still not accepting referrals from [place] at this time. [name] did report that patient and her spouse are on the top of the list to receive assistance when [facility] starts taking new patient's again.   Patient inquired about dental resources.  Provided her with contact information for the following: [facility], [facility]. [facility], [facility] [facility], Systems developer. Will follow up again next month regarding hearing test.  Ronn Melena, [place] Worker [phone]

## 2019-10-07 DIAGNOSIS — J301 Allergic rhinitis due to pollen: Secondary | ICD-10-CM | POA: Diagnosis not present

## 2019-10-08 DIAGNOSIS — J302 Other seasonal allergic rhinitis: Secondary | ICD-10-CM | POA: Diagnosis not present

## 2019-10-09 ENCOUNTER — Ambulatory Visit (INDEPENDENT_AMBULATORY_CARE_PROVIDER_SITE_OTHER): Payer: Medicare Other | Admitting: *Deleted

## 2019-10-09 DIAGNOSIS — J309 Allergic rhinitis, unspecified: Secondary | ICD-10-CM | POA: Diagnosis not present

## 2019-10-16 ENCOUNTER — Telehealth: Payer: Self-pay | Admitting: Adult Health

## 2019-10-16 ENCOUNTER — Other Ambulatory Visit: Payer: Self-pay | Admitting: Adult Health

## 2019-10-16 DIAGNOSIS — Z1231 Encounter for screening mammogram for malignant neoplasm of breast: Secondary | ICD-10-CM

## 2019-10-16 NOTE — Telephone Encounter (Signed)
Copied from [place] [id number]. Topic: General - Other >> Oct 16, 2019  4:24 PM Keene Breath wrote: Reason for CRM: Patient called to speak to the nurse regarding her metFORMIN (GLUCOPHAGE-XR) 500 MG 24 hr tablet, which was short of medication when she picked it up from the pharmacy.  Please call to discuss why.  CB# [phone]

## 2019-10-17 NOTE — Telephone Encounter (Signed)
Spoke to the pharmacy.  The prescription that the pt has currently is before the dose increase.  New Rx will ship out on 10/29/2019.  Nothing further needed.  Pt notified.

## 2019-10-21 ENCOUNTER — Other Ambulatory Visit: Payer: Self-pay

## 2019-10-21 ENCOUNTER — Ambulatory Visit (INDEPENDENT_AMBULATORY_CARE_PROVIDER_SITE_OTHER): Payer: Medicare Other | Admitting: Allergy

## 2019-10-21 ENCOUNTER — Ambulatory Visit: Payer: Self-pay

## 2019-10-21 ENCOUNTER — Encounter: Payer: Self-pay | Admitting: Allergy

## 2019-10-21 VITALS — BP 130/62 | HR 75 | Temp 97.6°F | Resp 18

## 2019-10-21 DIAGNOSIS — J3489 Other specified disorders of nose and nasal sinuses: Secondary | ICD-10-CM | POA: Diagnosis not present

## 2019-10-21 DIAGNOSIS — J309 Allergic rhinitis, unspecified: Secondary | ICD-10-CM

## 2019-10-21 DIAGNOSIS — T7800XD Anaphylactic reaction due to unspecified food, subsequent encounter: Secondary | ICD-10-CM

## 2019-10-21 DIAGNOSIS — K219 Gastro-esophageal reflux disease without esophagitis: Secondary | ICD-10-CM

## 2019-10-21 DIAGNOSIS — J4540 Moderate persistent asthma, uncomplicated: Secondary | ICD-10-CM | POA: Diagnosis not present

## 2019-10-21 DIAGNOSIS — J3089 Other allergic rhinitis: Secondary | ICD-10-CM

## 2019-10-21 MED ORDER — MONTELUKAST SODIUM 10 MG PO TABS: 10.0000 mg | ORAL_TABLET | Freq: Every day | ORAL | 1 refills | Status: DC

## 2019-10-21 MED ORDER — FLUTICASONE PROPIONATE 50 MCG/ACT NA SUSP: 1.0000 | Freq: Two times a day (BID) | NASAL | 1 refills | Status: DC

## 2019-10-21 MED ORDER — IPRATROPIUM BROMIDE 0.06 % NA SOLN: 2.0000 | Freq: Three times a day (TID) | NASAL | 1 refills | Status: DC | PRN

## 2019-10-21 NOTE — Assessment & Plan Note (Signed)
Past history - Felt better with [facility] but could not afford to purchase after samples ran out. Interim history - stopped all daily maintenance inhaler with no worsening symptoms. Declines spirometry today due to cost. ACT score 24.  . Daily controller medication(s):   Continue Singulair 10mg  daily.  . Prior to physical activity: May use albuterol rescue inhaler 2 puffs 5 to 15 minutes prior to strenuous physical activities. Marland Kitchen Rescue medications: May use albuterol rescue inhaler 2 puffs or nebulizer every 4 to 6 hours as needed for shortness of breath, chest tightness, coughing, and wheezing. Monitor frequency of use.  . Get spirometry at next visit.

## 2019-10-21 NOTE — Assessment & Plan Note (Signed)
   Continue taking omeprazole 40mg  daily.

## 2019-10-21 NOTE — Patient Instructions (Addendum)
Moderate persistent asthma, uncomplicated . Daily controller medication(s):   Continue Singulair 10mg  daily.  . Prior to physical activity: May use albuterol rescue inhaler 2 puffs 5 to 15 minutes prior to strenuous physical activities. Marland Kitchen Rescue medications: May use albuterol rescue inhaler 2 puffs or nebulizer every 4 to 6 hours as needed for shortness of breath, chest tightness, coughing, and wheezing. Monitor frequency of use.  . Asthma control goals:  o Full participation in all desired activities (may need albuterol before activity) o Albuterol use two times or less a week on average (not counting use with activity) o Cough interfering with sleep two times or less a month o Oral steroids no more than once a year o No hospitalizations  Perennial and seasonal allergic rhinoconjunctivitis Currently on allergy immunotherapy for tree/grass/weed/feather/cockroach/cat/dog/mold/dust mites since 2016.  Continue environmental control measures.  Continue allergy injections every 2 weeks.   May use over the counter antihistamines such as Zyrtec (cetirizine), Claritin (loratadine), Allegra (fexofenadine), or Xyzal (levocetirizine) daily as needed without the D.  Continue Flonase 1 spray twice a day.   Continue Atrovent 2 sprays in each nostril 3 times a day as needed for drainage.   Nasal saline spray (i.e., Simply Saline) or nasal saline lavage (i.e., NeilMed) is recommended as needed and prior to medicated nasal sprays.  Continue montelukast 10mg  daily.  Nasal septal perforation  Monitor symptoms.   Gastroesophageal reflux disease  Continue taking omeprazole 40mg  daily.  Anaphylactic shock due to adverse food reaction  Continue to avoid peanuts, cow's milk, soybean.  For mild symptoms you can take over the counter antihistamines such as Benadryl and monitor symptoms closely. If symptoms worsen or if you have severe symptoms including breathing issues, throat closure,  significant swelling, whole body hives, severe diarrhea and vomiting, lightheadedness then seek immediate medical care.  Follow up in 4 months or sooner if needed.

## 2019-10-21 NOTE — Progress Notes (Signed)
Follow Up Note  RE: Kimberly Lam MRN: [id number] DOB: Nov 19, 1946 Date of Office Visit: 10/21/2019  Referring provider: [name], NP Primary care provider: [name], NP  Chief Complaint: Follow-up  History of Present Illness: I had the pleasure of seeing Kimberly Lam for a follow up visit at the [facility] on 10/21/2019. She is a 73 y.o. female, who is being followed for asthma, allergic rhinoconjunctivitis, nasal septal perforation, GERD, food allergies. Today she is here for regular follow up visit. Her previous allergy office visit was on 06/19/2019 with Dr. Maudie Mercury.   Asthma: Doing well but does flare with increased exertion. No worsening symptoms since stopped Advair. Denies any SOB, wheezing, chest tightness, nocturnal awakenings, ER/urgent care visits or prednisone use since the last visit. Coughing at times from PND.   Still taking Singulair '10mg'$  daily at night.   Declines spirometry due to cost.  Perennial and seasonal allergic rhinoconjunctivitis Currently on biweekly injections and doing well. Notices headaches if she's late or when she's due.  Takes Xyzal as needed.  Still on Flonase and Atrovent.  Nasal septal perforation Patient went to see ENT and now just monitoring it.    Gastroesophageal reflux disease Stable with omeprazole '40mg'$  daily.  Anaphylactic shock due to adverse food reaction Noticed that if she is eating anything with soy, her sinuses seems to flare up. Currently avoiding peanuts, cow's milk and soy.  Tolerating almond milk. Patient has not been able to pick up Epipen due to cost.   Assessment and Plan: Kimberly Lam is a 73 y.o. female with: Perennial and seasonal allergic rhinoconjunctivitis Past history - allergy immunotherapy for tree/grass/weed/feather/cockroach/cat/dog/mold/dust mites since 2016. Interim history - stable.   Continue environmental control measures.  Continue allergy injections every 2 weeks.    May use over the counter antihistamines such as Zyrtec (cetirizine), Claritin (loratadine), Allegra (fexofenadine), or Xyzal (levocetirizine) daily as needed without the D.  Continue Flonase 1 spray twice a day.   Continue Atrovent 2 sprays in each nostril 3 times a day as needed for drainage.   Nasal saline spray (i.e., Simply Saline) or nasal saline lavage (i.e., NeilMed) is recommended as needed and prior to medicated nasal sprays.  Continue montelukast '10mg'$  daily.  Moderate persistent asthma, uncomplicated Past history - Felt better with [facility] but could not afford to purchase after samples ran out. Interim history - stopped all daily maintenance inhaler with no worsening symptoms. Declines spirometry today due to cost. ACT score 24.  . Daily controller medication(s):   Continue Singulair '10mg'$  daily.  . Prior to physical activity: May use albuterol rescue inhaler 2 puffs 5 to 15 minutes prior to strenuous physical activities. Marland Kitchen Rescue medications: May use albuterol rescue inhaler 2 puffs or nebulizer every 4 to 6 hours as needed for shortness of breath, chest tightness, coughing, and wheezing. Monitor frequency of use.  . Get spirometry at next visit.   Nasal septal perforation Saw ENT - no intervention for now.   Monitor symptoms.    Gastroesophageal reflux disease  Continue taking omeprazole '40mg'$  daily.  Anaphylactic shock due to adverse food reaction Interim history - Patient did not pick up Epipen due to cost. Soy causes increased nasal symptoms.   Continue to avoid peanuts, cow's milk, soybean.  For mild symptoms you can take over the counter antihistamines such as Benadryl and monitor symptoms closely. If symptoms worsen or if you have severe symptoms including breathing issues, throat closure, significant swelling, whole body hives,  severe diarrhea and vomiting, lightheadedness then seek immediate medical care.  Return in about 4 months (around 02/18/2020).  Meds  ordered this encounter  Medications  . fluticasone (FLONASE) 50 MCG/ACT nasal spray    Sig: Place 1 spray into both nostrils 2 (two) times daily.    Dispense:  48 g    Refill:  1  . montelukast (SINGULAIR) 10 MG tablet    Sig: Take 1 tablet (10 mg total) by mouth at bedtime.    Dispense:  90 tablet    Refill:  1  . ipratropium (ATROVENT) 0.06 % nasal spray    Sig: Place 2 sprays into both nostrils 3 (three) times daily as needed for rhinitis.    Dispense:  45 mL    Refill:  1   Diagnostics: Spirometry:  Declines due to cost.   Medication List:  Current Outpatient Medications  Medication Sig Dispense Refill  . Accu-Chek FastClix Lancets MISC USE TO TEST BLOOD GLUCOSE 3 TO 4 TIMES DAILY 400 each 3  . Alcohol Swabs (B-D SINGLE USE SWABS REGULAR) PADS USE TO TEST BLOOD GLUCOSE FOUR TIMES DAILY 200 each 3  . alendronate (FOSAMAX) 70 MG tablet TAKE 1 TABLET BY MOUTH 1  TIME A WEEK. TAKE WITH A  FULL GLASS OF WATER ON AN  EMPTY STOMACH. 12 tablet 3  . Ascorbic Acid (VITAMIN C) 1000 MG tablet Take 1,000 mg by mouth as needed.    Kimberly Lam (BERBERINE COMPLEX PO) Take by mouth daily.    . Blood Glucose Monitoring Suppl (ACCU-CHEK AVIVA PLUS) w/Device KIT     . cholecalciferol (VITAMIN D) 1000 units tablet Take 1,000 Units by mouth daily.    Marland Kitchen CINNAMON PO Take 1 tablet by mouth as needed.    . Cranberry 450 MG CAPS Take 1 capsule by mouth as needed.    . Cyanocobalamin (VITAMIN B-12 CR) 1500 MCG TBCR Take 1 tablet by mouth as needed.    . Echinacea-Goldenseal (ECHINACEA COMB/GOLDEN SEAL PO) Take 1 tablet by mouth as needed. Reported on 12/16/2015    . fluticasone (FLONASE) 50 MCG/ACT nasal spray Place 1 spray into both nostrils 2 (two) times daily. 48 g 1  . gabapentin (NEURONTIN) 300 MG capsule TAKE 1 CAPSULE BY MOUTH 4  TIMES DAILY 360 capsule 3  . glucose blood (ACCU-CHEK GUIDE) test strip USE TO TEST BLOOD GLUCOSE FOUR TIMES DAILY 400 each 3  . Green Tea, Camillia  sinensis, (GREEN TEA PO) Take by mouth as needed. Reported on 12/16/2015    . Lactobacillus (ACIDOPHILUS PROBIOTIC PO) Take 1 tablet by mouth daily.    Marland Kitchen levocetirizine (XYZAL) 5 MG tablet Take 1 tablet (5 mg total) by mouth every evening. 90 tablet 1  . metFORMIN (GLUCOPHAGE-XR) 500 MG 24 hr tablet Take 2 tablets (1,000 mg total) by mouth 2 (two) times daily. 360 tablet 0  . montelukast (SINGULAIR) 10 MG tablet Take 1 tablet (10 mg total) by mouth at bedtime. 90 tablet 1  . nystatin (MYCOSTATIN) 100000 UNIT/ML suspension Swish and Swallow 5 mL 4 times daily 60 mL 1  . Omega-3 Fatty Acids (OMEGA 3 PO) Take 1 tablet by mouth as needed.    Vladimir Faster Glycol-Propyl Glycol (SYSTANE FREE OP) Apply 1 drop to eye daily.    . ramipril (ALTACE) 2.5 MG capsule Take 1 capsule (2.5 mg total) by mouth daily. 90 capsule 1  . simvastatin (ZOCOR) 20 MG tablet Take 1 tablet (20 mg total) by mouth daily. 90 tablet  0  . Spacer/Aero-Holding Chambers (AEROCHAMBER PLUS) inhaler Use as instructed with MDI 1 each 2  . Stinging Nettle 2 % POWD Take 1 tablet by mouth as needed. Reported on 12/16/2015    . traZODone (DESYREL) 50 MG tablet Take 0.5-1 tablets (25-50 mg total) by mouth at bedtime as needed. for sleep 90 tablet 1  . TURMERIC PO Take 800 mg by mouth as needed. Reported on 12/16/2015    . ipratropium (ATROVENT) 0.06 % nasal spray Place 2 sprays into both nostrils 3 (three) times daily as needed for rhinitis. 45 mL 1   No current facility-administered medications for this visit.    Allergies: Allergies  Allergen Reactions  . Peanut-Containing Drug Products Anaphylaxis  . Milk-Related Compounds Nausea And Vomiting    Milk and cheese  . Actos [Pioglitazone] Nausea Only  . Soy Allergy Nausea Only    Pt states had allergy testing and was told allergic to soy-causes nausea but no other reactions  . Vicodin [Hydrocodone-Acetaminophen] Nausea Only   I reviewed her past medical history, social history, family  history, and environmental history and no significant changes have been reported from her previous visit.  Review of Systems  Constitutional: Negative for appetite change, chills, fever and unexpected weight change.  HENT: Positive for postnasal drip. Negative for congestion and rhinorrhea.   Eyes: Negative for itching.  Respiratory: Negative for cough, chest tightness, shortness of breath and wheezing.   Gastrointestinal: Negative for abdominal pain.  Skin: Negative for rash.  Allergic/Immunologic: Positive for environmental allergies and food allergies.  Neurological: Negative for headaches.   Objective: BP 130/62 (BP Location: Left Arm, Patient Position: Sitting, Cuff Size: Normal)   Pulse 75   Temp 97.6 F (36.4 C) (Temporal)   Resp 18   SpO2 98%  There is no height or weight on file to calculate BMI. Physical Exam  Constitutional: She is oriented to person, place, and time. She appears well-developed and well-nourished.  HENT:  Head: Normocephalic and atraumatic.  Right Ear: External ear normal.  Left Ear: External ear normal.  Mouth/Throat: Oropharynx is clear and moist.  Septal perforation  Eyes: Conjunctivae and EOM are normal.  Neck: Neck supple.  Cardiovascular: Normal rate, regular rhythm and normal heart sounds. Exam reveals no gallop and no friction rub.  No murmur heard. Pulmonary/Chest: Effort normal and breath sounds normal. She has no wheezes. She has no rales.  Neurological: She is alert and oriented to person, place, and time.  Skin: Skin is warm. No rash noted.  Psychiatric: She has a normal mood and affect. Her behavior is normal.  Nursing note and vitals reviewed.  Previous notes and tests were reviewed. The plan was reviewed with the patient/family, and all questions/concerned were addressed.  It was my pleasure to see Domenica today and participate in her care. Please feel free to contact me with any questions or concerns.  Sincerely,  Rexene Alberts, DO  Allergy & Immunology  [facility] office: [phone] [facility] office: [place] office: [phone]

## 2019-10-21 NOTE — Assessment & Plan Note (Signed)
Interim history - Patient did not pick up Epipen due to cost. Soy causes increased nasal symptoms.   Continue to avoid peanuts, cow's milk, soybean.  For mild symptoms you can take over the counter antihistamines such as Benadryl and monitor symptoms closely. If symptoms worsen or if you have severe symptoms including breathing issues, throat closure, significant swelling, whole body hives, severe diarrhea and vomiting, lightheadedness then seek immediate medical care.

## 2019-10-21 NOTE — Assessment & Plan Note (Signed)
Past history - allergy immunotherapy for tree/grass/weed/feather/cockroach/cat/dog/mold/dust mites since 2016. Interim history - stable.   Continue environmental control measures.  Continue allergy injections every 2 weeks.   May use over the counter antihistamines such as Zyrtec (cetirizine), Claritin (loratadine), Allegra (fexofenadine), or Xyzal (levocetirizine) daily as needed without the D.  Continue Flonase 1 spray twice a day.   Continue Atrovent 2 sprays in each nostril 3 times a day as needed for drainage.   Nasal saline spray (i.e., Simply Saline) or nasal saline lavage (i.e., NeilMed) is recommended as needed and prior to medicated nasal sprays.  Continue montelukast 10mg  daily.

## 2019-10-21 NOTE — Assessment & Plan Note (Signed)
Saw ENT - no intervention for now.   Monitor symptoms.   

## 2019-10-29 ENCOUNTER — Ambulatory Visit: Payer: Medicare Other | Admitting: Neurology

## 2019-11-04 ENCOUNTER — Telehealth: Payer: Self-pay | Admitting: Adult Health

## 2019-11-04 NOTE — Telephone Encounter (Signed)
Copied from [facility] [phone]. Topic: General - Other >> Nov 04, 2019 10:03 AM Keene Breath wrote: Reason for CRM: Patient would like for the nurse to call her regarding when she should come in for her 4 month diabetic appt.  Patient was not exactly sure when she should schedule her appt.  Please advise and call patient back at [phone]

## 2019-11-05 ENCOUNTER — Other Ambulatory Visit: Payer: Self-pay | Admitting: Family Medicine

## 2019-11-05 DIAGNOSIS — E119 Type 2 diabetes mellitus without complications: Secondary | ICD-10-CM

## 2019-11-05 NOTE — Telephone Encounter (Signed)
Pt now scheduled for lab appointment and follow up.  Nothing further needed.

## 2019-11-05 NOTE — Telephone Encounter (Signed)
Left a message for a return call.  CRM created. 

## 2019-11-06 ENCOUNTER — Other Ambulatory Visit: Payer: Self-pay

## 2019-11-06 ENCOUNTER — Other Ambulatory Visit (INDEPENDENT_AMBULATORY_CARE_PROVIDER_SITE_OTHER): Payer: Medicare Other

## 2019-11-06 ENCOUNTER — Ambulatory Visit (INDEPENDENT_AMBULATORY_CARE_PROVIDER_SITE_OTHER): Payer: Medicare Other

## 2019-11-06 DIAGNOSIS — E119 Type 2 diabetes mellitus without complications: Secondary | ICD-10-CM | POA: Diagnosis not present

## 2019-11-06 DIAGNOSIS — J309 Allergic rhinitis, unspecified: Secondary | ICD-10-CM | POA: Diagnosis not present

## 2019-11-06 LAB — HEMOGLOBIN A1C: Hgb A1c MFr Bld: 7 % — ABNORMAL HIGH (ref 4.6–6.5)

## 2019-11-07 ENCOUNTER — Telehealth (INDEPENDENT_AMBULATORY_CARE_PROVIDER_SITE_OTHER): Payer: Medicare Other | Admitting: Adult Health

## 2019-11-07 DIAGNOSIS — E119 Type 2 diabetes mellitus without complications: Secondary | ICD-10-CM

## 2019-11-07 NOTE — Progress Notes (Signed)
Virtual Visit via Telephone Note  I connected with Kimberly Lam on 11/07/19 at  1:00 PM EST by telephone and verified that I am speaking with the correct person using two identifiers.   I discussed the limitations, risks, security and privacy concerns of performing an evaluation and management service by telephone and the availability of in person appointments. I also discussed with the patient that there may be a patient responsible charge related to this service. The patient expressed understanding and agreed to proceed.  Location patient: home Location provider: work or home office Participants present for the call: patient, provider Patient did not have a visit in the prior 7 days to address this/these issue(s).   History of Present Illness: 73 year old female who is being evaluated today for follow-up regarding diabetes mellitus.  She is currently maintained on metformin 1000 mg extended release twice daily.  Her last A1c was 7.2.  She has been working on diet but continues to snack in the evening times.  Her snacks often include carbs and sugars.  She is walking on a regular basis.  She reports that her blood sugars are sometimes above 200 right after eating but they eventually retreat back to the 1 40-1 50 range.  She denies hypoglycemia.  Observations/Objective: Patient sounds cheerful and well on the phone. I do not appreciate any SOB. Speech and thought processing are grossly intact. Patient reported vitals:  Assessment and Plan: 1. Diabetes mellitus without complication (HCC) -123456 is 7.0.  She has had improvement.  Continue with current dose of Metformin.  She was advised to make smarter snack choices and eat earlier in the evening.  She will follow-up in 3 months for her CPE at which time we can retest.   Follow Up Instructions:  I did not refer this patient for an OV in the next 24 hours for this/these issue(s).  I discussed the assessment and treatment plan with the  patient. The patient was provided an opportunity to ask questions and all were answered. The patient agreed with the plan and demonstrated an understanding of the instructions.   The patient was advised to call back or seek an in-person evaluation if the symptoms worsen or if the condition fails to improve as anticipated.  I provided 12 minutes of non-face-to-face time during this encounter.   [name], NP

## 2019-11-07 NOTE — Addendum Note (Signed)
Addended by: Apolinar Junes on: 11/07/2019 01:12 PM   Modules accepted: Level of Service

## 2019-11-09 ENCOUNTER — Other Ambulatory Visit: Payer: Self-pay | Admitting: Allergy

## 2019-11-09 ENCOUNTER — Other Ambulatory Visit: Payer: Self-pay | Admitting: Family Medicine

## 2019-11-09 ENCOUNTER — Other Ambulatory Visit: Payer: Self-pay | Admitting: Adult Health

## 2019-11-09 DIAGNOSIS — J309 Allergic rhinitis, unspecified: Secondary | ICD-10-CM

## 2019-11-11 ENCOUNTER — Ambulatory Visit: Payer: Self-pay

## 2019-11-11 ENCOUNTER — Other Ambulatory Visit: Payer: Self-pay

## 2019-11-11 NOTE — Patient Outreach (Signed)
[facility]) Care Management  11/11/2019  Kimberly Lam 1946-10-09 [id number]   Attempted to follow up with patient today regarding social work referral for assistance with purchase of eye glasses and hearing aid. Still waiting to hear back from [name] with [place] regarding assistance with eye glasses.  During previous outreach, [name] did report that patient and her spouse are on the top of the list to receive assistance when [facility] starts taking new patient's again.  Have talked with patient several times about hearing aid assistance through [place] [facility] of Hearing. Patient must first get hearing test.  She has been hesitant to schedule appointment due to possible exposure to [place].  Attempted to contact her today to determine if hearing test has occurred but had to leave message.  Will attempt to reach her again within four business days if no return call.  Ronn Melena, BSW Social Worker [phone]

## 2019-11-11 NOTE — Telephone Encounter (Signed)
SENT TO THE PHARMACY BY E-SCRIBE FOR 90 DAYS. 

## 2019-11-12 ENCOUNTER — Ambulatory Visit (INDEPENDENT_AMBULATORY_CARE_PROVIDER_SITE_OTHER): Payer: Medicare Other

## 2019-11-12 DIAGNOSIS — J309 Allergic rhinitis, unspecified: Secondary | ICD-10-CM

## 2019-11-18 ENCOUNTER — Ambulatory Visit: Payer: Medicare Other

## 2019-11-18 ENCOUNTER — Other Ambulatory Visit: Payer: Self-pay | Admitting: *Deleted

## 2019-11-18 ENCOUNTER — Ambulatory Visit: Payer: Medicare HMO

## 2019-11-18 NOTE — Patient Outreach (Signed)
[facility]) Care Management  11/18/2019  Omega Rathmann Vasconez 1946/05/23 [id number]   RN Health Coach attempted follow up outreach call to patient.  Patient was unavailable. HIPPA compliance voicemail message left with return callback number.  Plan: RN will call patient again within 30 days.  [facility] Care Management [phone]

## 2019-11-19 ENCOUNTER — Other Ambulatory Visit: Payer: Self-pay

## 2019-11-19 ENCOUNTER — Ambulatory Visit (INDEPENDENT_AMBULATORY_CARE_PROVIDER_SITE_OTHER): Payer: Medicare Other | Admitting: *Deleted

## 2019-11-19 ENCOUNTER — Ambulatory Visit: Payer: Self-pay

## 2019-11-19 DIAGNOSIS — J309 Allergic rhinitis, unspecified: Secondary | ICD-10-CM | POA: Diagnosis not present

## 2019-11-19 NOTE — Patient Outreach (Signed)
[facility]) Care Management  11/19/2019  Khalisha Gramajo [place] August 23, 1946 [id number]   Second unsuccessful attempt to follow up with patient today regarding social work referral for assistance with purchase of eye glasses and hearing aid. Still waiting to hear back from [name] with [facility] regarding assistance with eye glasses.  During previous outreach, [name] did report that patient and her spouse are on the top of the list to receive assistance when [facility] starts taking new patient's again. Have talked with patient several times about hearing aid assistance through [place] [facility] the [facility] and Hard of Hearing. Patient must first get hearing test.  She has been hesitant to schedule appointment due to possible exposure to [place].  Attempted to contact her today to determine if hearing test has occurred but had to leave message.  Will attempt to reach her again within four business days if no return call.  Mailed unsuccessful outreach letter.  Ronn Melena, BSW Social Worker [phone]

## 2019-11-20 ENCOUNTER — Other Ambulatory Visit: Payer: Self-pay

## 2019-11-20 NOTE — Patient Outreach (Signed)
[facility]) Care Management  11/20/2019  Kimberly Lam 1946-06-22 [id number]   Received return call from patient today.Patient originally referred  for assistance with purchase of eye glasses and hearing aid; spouse requesting assistance as well. Still waiting to hear back from [name] with [facility] regarding assistance with eye glasses. During previous outreach, [name] did report that patient and her spouse are on the top of the list to receive assistance when [facility] starts taking new patient's again. Have talked with patient several times about hearing aid assistance [place] [facility] and Hard of Hearing.Patient must first get hearing test. She has been hesitant to schedule appointment due to possible exposure to [place]. As of today, hearing test has still not been scheduled.  Patient reports that spouse has been using amplifier which seems to be working well.  Patient stated that dental needs are biggest concern at this point.  Patient has been provided with potential resources.  Going to close social work case at this time since patient is not currently pursuing assistance through [facility].  Will, however, contact patient when [name] with [facility] confirms that patient and spouse can receive assistance with purchase of glasses.  Ronn Melena, BSW Social Worker [phone]

## 2019-11-20 NOTE — Progress Notes (Signed)
PATIENT: Dave Mannes Domingos DOB: [birth year]  REASON FOR VISIT: follow up HISTORY FROM: patient  HISTORY OF PRESENT ILLNESS: Today 11/21/19  HISTORY   Velicia Kiwana Deblasi is a 73 years old female, seen in refer by her primary care nurse practitioner [name] for evaluation of leg cramping, tremors, initial evaluation was on January 23, 2018.    She has past medical history of hypertension, diabetes, hyperlipidemia  I saw her initially in 2016 for evaluation of low back pain, radiating pain to bilateral hip, bilateral leg muscle cramping, and weakness.  She has history of low back pain since 2006, midline low back, occasionally go to posterior thigh, go down below her knee, worsening with prolonged standing, or walking,  Previous her low back pain has responded very well to epidural injection, but the benefit gradually fades away, over the years, she complains of worsening low back pain, radiating pain to bilateral lower extremity again, in addition, she complains of bilateral feet paresthesia,   She had a history of diabetes, she denies gait difficulty, no bowel and bladder incontinence, she denies bilateral upper extremity paresthesia or weakness.   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   Note from Dr. Franciso Bend, [place], [place] in 2017, she was diagnosed with degenerative spondylolisthesis L4-L5, L5-S1, neurogenic claudication, low back pain, received epidural injection in Feb 2015, benefit lasted about one year  MRI of lumbar spine December 2016, There is a transitional L6/S1 vertebral body. 8 mm of anterolisthesis of L5 upon L6/S1 of a degenerative nature due to severe facet hypertrophy. At this level, there is mild spinal stenosis and moderately severe right foraminal narrowing. There could be  compression of the right L5 nerve root. Degenerative changes at L6/S1-S2 lead to severe right foraminal narrowing that could lead to compression of the exiting right S1 nerve root  She continue complains of chronic low back pain, today she also complains of new onset right hand shaking, that was intermittent, getting worse when she holding utensils, writing, mild involvement on the left side, she continue complains of frequent lower extremity cramping especially on the right side, difficulty to walk in the morning.  There was no family history of tremor  UPDATE Nov 5th 2019: She was given prescription of gabapentin for her bilateral feet paresthesia, 300 mg 4 times a day does help her lower extremity cramps, but complains of daytime sleepiness, but she often woke up in the middle of the night complains of left muscle cramping, difficulty going to sleep,  We again personally reviewed MRI of lumbar spine in December 2016, transitional L6-S1 vertebral body, 8 mm of anterolisthesis of L5 upon S1 with severe facet hypertrophy, mild spinal stenosis, moderate severe right foraminal narrowing, potential compression of right L5 nerve roots,  UPDATE July 10 2019: She is overall doing much better, taking gabapentin 300 mg 2 tablets before bedtime, open skip daytime dose, epidural injection few months ago has helped her as well, she exercise regularly, denies gait abnormality, denies bowel and bladder incontinence  Update November 21, 2019 SS: She overall continues to do very well.  Apparently, she rotates her gabapentin dosing, says she takes gabapentin 300 mg, 2 capsules at least twice a day, at most she may take 4 times a day.  She adjust the regimen depending on her leg cramps.  She also has some right  hip pain.  She knows that her low back gets to bothering her, she will do stretching, and she is able to control it well.  She is going to relocate to [place], but may still come here if she can't find  another neurologist.   REVIEW OF SYSTEMS: Out of a complete 14 system review of symptoms, the patient complains only of the following symptoms, and all other reviewed systems are negative.  Back pain, joint pain, joint swelling, neck stiffness, insomnia  ALLERGIES: Allergies  Allergen Reactions  . Peanut-Containing Drug Products Anaphylaxis  . Milk-Related Compounds Nausea And Vomiting    Milk and cheese  . Actos [Pioglitazone] Nausea Only  . Soy Allergy Nausea Only    Pt states had allergy testing and was told allergic to soy-causes nausea but no other reactions  . Vicodin [Hydrocodone-Acetaminophen] Nausea Only    HOME MEDICATIONS: Outpatient Medications Prior to Visit  Medication Sig Dispense Refill  . Accu-Chek FastClix Lancets MISC USE TO TEST BLOOD GLUCOSE 3 TO 4 TIMES DAILY 400 each 3  . Alcohol Swabs (B-D SINGLE USE SWABS REGULAR) PADS USE TO TEST BLOOD GLUCOSE FOUR TIMES DAILY 200 each 3  . alendronate (FOSAMAX) 70 MG tablet TAKE 1 TABLET BY MOUTH 1  TIME A WEEK. TAKE WITH A  FULL GLASS OF WATER ON AN  EMPTY STOMACH. 12 tablet 3  . Ascorbic Acid (VITAMIN C) 1000 MG tablet Take 1,000 mg by mouth as needed.    Jolyne Loa Grape-Goldenseal (BERBERINE COMPLEX PO) Take by mouth daily.    . Blood Glucose Monitoring Suppl (ACCU-CHEK AVIVA PLUS) w/Device KIT     . cholecalciferol (VITAMIN D) 1000 units tablet Take 1,000 Units by mouth daily.    Marland Kitchen CINNAMON PO Take 1 tablet by mouth as needed.    . Cranberry 450 MG CAPS Take 1 capsule by mouth as needed.    . Cyanocobalamin (VITAMIN B-12 CR) 1500 MCG TBCR Take 1 tablet by mouth as needed.    . Echinacea-Goldenseal (ECHINACEA COMB/GOLDEN SEAL PO) Take 1 tablet by mouth as needed. Reported on 12/16/2015    . fluticasone (FLONASE) 50 MCG/ACT nasal spray Place 1 spray into both nostrils 2 (two) times daily. 48 g 1  . gabapentin (NEURONTIN) 300 MG capsule TAKE 1 CAPSULE BY MOUTH 4  TIMES DAILY 360 capsule 3  . glucose blood (ACCU-CHEK  GUIDE) test strip USE TO TEST BLOOD GLUCOSE FOUR TIMES DAILY 400 each 3  . Green Tea, Camillia sinensis, (GREEN TEA PO) Take by mouth as needed. Reported on 12/16/2015    . ipratropium (ATROVENT) 0.06 % nasal spray INSTILL 2 SPRAYS IN BOTH  NOSTRILS 3 TIMES DAILY AS  NEEDED FOR RHINITIS 90 mL 0  . Lactobacillus (ACIDOPHILUS PROBIOTIC PO) Take 1 tablet by mouth daily.    Marland Kitchen levocetirizine (XYZAL) 5 MG tablet TAKE 1 TABLET BY MOUTH  EVERY EVENING 90 tablet 1  . metFORMIN (GLUCOPHAGE-XR) 500 MG 24 hr tablet TAKE 2 TABLETS BY MOUTH  TWICE DAILY 360 tablet 0  . montelukast (SINGULAIR) 10 MG tablet Take 1 tablet (10 mg total) by mouth at bedtime. 90 tablet 1  . Omega-3 Fatty Acids (OMEGA 3 PO) Take 1 tablet by mouth as needed.    Vladimir Faster Glycol-Propyl Glycol (SYSTANE FREE OP) Apply 1 drop to eye daily.    . ramipril (ALTACE) 2.5 MG capsule Take 1 capsule (2.5 mg total) by mouth daily. 90 capsule 1  . simvastatin (ZOCOR) 20 MG tablet Take 1 tablet (20  mg total) by mouth daily. 90 tablet 0  . Spacer/Aero-Holding Chambers (AEROCHAMBER PLUS) inhaler Use as instructed with MDI 1 each 2  . Stinging Nettle 2 % POWD Take 1 tablet by mouth as needed. Reported on 12/16/2015    . traZODone (DESYREL) 50 MG tablet Take 0.5-1 tablets (25-50 mg total) by mouth at bedtime as needed. for sleep 90 tablet 1  . TURMERIC PO Take 800 mg by mouth as needed. Reported on 12/16/2015    . nystatin (MYCOSTATIN) 100000 UNIT/ML suspension Swish and Swallow 5 mL 4 times daily 60 mL 1   No facility-administered medications prior to visit.     PAST MEDICAL HISTORY: Past Medical History:  Diagnosis Date  . Acid reflux   . Allergic rhinitis   . Anemia   . Asthma   . Bilateral sciatica   . Chronic bilateral low back pain   . Colon polyps   . Diverticulosis   . DM type 2 (diabetes mellitus, type 2) ([place])   . Environmental allergies    flowers, pollen, trees  . GERD (gastroesophageal reflux disease)   . Hyperlipidemia    . Low back pain   . Migraine     PAST SURGICAL HISTORY: Past Surgical History:  Procedure Laterality Date  . CATARACT EXTRACTION, BILATERAL Bilateral 2012  . CESAREAN SECTION     x 2  . DILATION AND CURETTAGE OF UTERUS    . NASAL SINUS SURGERY    . TUBAL LIGATION      FAMILY HISTORY: Family History  Problem Relation Age of Onset  . Asthma Father   . Emphysema Father   . Alcohol abuse Father   . Diabetes Mellitus II Brother        x 2  . Hyperlipidemia Brother   . Hypertension Brother   . Pancreatic cancer Brother   . Prostate cancer Brother   . Heart disease Maternal Grandmother        hardening of the arteries  . Heart murmur Daughter   . Glaucoma Brother   . Breast cancer Neg Hx     SOCIAL HISTORY: Social History   Socioeconomic History  . Marital status: Married    Spouse name: Not on file  . Number of children: 2  . Years of education: 45  . Highest education level: Not on file  Occupational History  . Occupation: Retired  Scientific laboratory technician  . Financial resource strain: Not on file  . Food insecurity    Worry: Not on file    Inability: Not on file  . Transportation needs    Medical: Not on file    Non-medical: Not on file  Tobacco Use  . Smoking status: Never Smoker  . Smokeless tobacco: Never Used  Substance and Sexual Activity  . Alcohol use: No    Alcohol/week: 0.0 standard drinks  . Drug use: No  . Sexual activity: Not on file  Lifestyle  . Physical activity    Days per week: Not on file    Minutes per session: Not on file  . Stress: Not on file  Relationships  . Social Herbalist on phone: Not on file    Gets together: Not on file    Attends religious service: Not on file    Active member of club or organization: Not on file    Attends meetings of clubs or organizations: Not on file    Relationship status: Not on file  . Intimate partner violence  Fear of current or ex partner: Not on file    Emotionally abused: Not on file     Physically abused: Not on file    Forced sexual activity: Not on file  Other Topics Concern  . Not on file  Social History Narrative   ** Merged History Encounter **       ** Data from: 11/23/15 Enc Dept: [facility]   Married 1 son one daughter, relocated to [place] after living in [place] for many years. She is a Writer of [employer]. No caffeine.       ** Data from: 11/25/15 Enc Dept: [facility]   Lives at home with her husband and brother. Right-handed. No caffeine use.   PHYSICAL EXAM  Vitals:   11/21/19 1258  BP: 111/65  Pulse: 83  Weight: 135 lb 6.4 oz (61.4 kg)  Height: _0  (1.626 m)   Body mass index is 23.24 kg/m.  Generalized: Well developed, in no acute distress   Neurological examination  Mentation: Alert oriented to time, place, history taking. Follows all commands speech and language fluent Cranial nerve II-XII: Pupils were equal round reactive to light. Extraocular movements were full, visual field were full on confrontational test. Facial sensation and strength were normal. Head turning and shoulder shrug  were normal and symmetric. Motor: The motor testing reveals 5 over 5 strength of all 4 extremities. Good symmetric motor tone is noted throughout.  Sensory: Sensory testing is intact to soft touch on all 4 extremities. No evidence of extinction is noted.  Coordination: Cerebellar testing reveals good finger-nose-finger and heel-to-shin bilaterally.  Gait and station: Gait is normal. Tandem gait is normal.  Able to walk on heels and tiptoe. Reflexes: Deep tendon reflexes are symmetric and normal bilaterally.   DIAGNOSTIC DATA (LABS, IMAGING, TESTING) - I reviewed patient records, labs, notes, testing and imaging myself where available.  Lab Results  Component Value Date   WBC 2.9 (L) 10/18/2018   HGB 14.3 10/22/2018   HCT 42.0 10/22/2018   MCV 92.2 10/18/2018   PLT 192.0 10/18/2018      Component Value  Date/Time   NA 142 10/22/2018 2033   NA 140 04/12/2017   K 4.3 10/22/2018 2033   CL 105 10/22/2018 2033   CO2 31 10/18/2018 1213   GLUCOSE 119 (H) 10/22/2018 2033   BUN 10 10/22/2018 2033   BUN 11 04/12/2017   CREATININE 0.70 10/22/2018 2033   CALCIUM 10.3 10/18/2018 1213   PROT 7.2 10/18/2018 1213   ALBUMIN 4.7 10/18/2018 1213   AST 14 10/18/2018 1213   ALT 14 10/18/2018 1213   ALKPHOS 36 (L) 10/18/2018 1213   BILITOT 0.4 10/18/2018 1213   Lab Results  Component Value Date   CHOL 166 10/18/2018   HDL 68.20 10/18/2018   LDLCALC 79 10/18/2018   TRIG 91.0 10/18/2018   CHOLHDL 2 10/18/2018   Lab Results  Component Value Date   HGBA1C 7.0 (H) 11/06/2019   No results found for: VITAMINB12 Lab Results  Component Value Date   TSH 1.53 10/18/2018   ASSESSMENT AND PLAN 73 y.o. year old female  has a past medical history of Acid reflux, Allergic rhinitis, Anemia, Asthma, Bilateral sciatica, Chronic bilateral low back pain, Colon polyps, Diverticulosis, DM type 2 (diabetes mellitus, type 2) ([place]), Environmental allergies, GERD (gastroesophageal reflux disease), Hyperlipidemia, Low back pain, and Migraine. here with:  1.  Chronic low back pain, radiating pain to bilateral lower extremities -Consistent  with lumbar stenosis -Overall, continues to do well, no significant gait abnormality or weakness -Continue gabapentin 300 mg capsule, says she is taking 600 mg at minimum twice a day, at most 4 times a day, depending on leg cramps -She has surprisingly not been running out of her medication, dose on file is gabapentin 300 mg, 1 capsule 4 times a day, was filled in October 2020 -She may relocate to [place], I gave her 13-monthfollow-up here in case she cannot establish with another neurology group  I spent 15 minutes with the patient. 50% of this time was spent discussing her plan of care.  SButler Denmark AGNP-C, DNP 11/21/2019, 1:10 PM [facility] [address]  [place]ort [place] [phone]

## 2019-11-21 ENCOUNTER — Ambulatory Visit: Payer: Medicare Other | Admitting: Neurology

## 2019-11-21 ENCOUNTER — Ambulatory Visit: Payer: Self-pay

## 2019-11-21 ENCOUNTER — Other Ambulatory Visit: Payer: Self-pay

## 2019-11-21 ENCOUNTER — Encounter: Payer: Self-pay | Admitting: Neurology

## 2019-11-21 VITALS — BP 111/65 | HR 83 | Ht 64.0 in | Wt 135.4 lb

## 2019-11-21 DIAGNOSIS — M5416 Radiculopathy, lumbar region: Secondary | ICD-10-CM

## 2019-11-21 NOTE — Patient Instructions (Addendum)
Continue the gabapentin with the current regimen you are using  Follow-up here in 8 months

## 2019-11-25 ENCOUNTER — Encounter: Payer: Self-pay | Admitting: Adult Health

## 2019-11-25 ENCOUNTER — Other Ambulatory Visit: Payer: Self-pay | Admitting: Family Medicine

## 2019-11-25 DIAGNOSIS — G4700 Insomnia, unspecified: Secondary | ICD-10-CM

## 2019-11-26 MED ORDER — TRAZODONE HCL 50 MG PO TABS: 25.0000 mg | ORAL_TABLET | Freq: Every evening | ORAL | 0 refills | Status: DC | PRN

## 2019-12-05 ENCOUNTER — Telehealth: Payer: Self-pay

## 2019-12-05 ENCOUNTER — Ambulatory Visit (INDEPENDENT_AMBULATORY_CARE_PROVIDER_SITE_OTHER): Payer: Medicare Other

## 2019-12-05 DIAGNOSIS — J309 Allergic rhinitis, unspecified: Secondary | ICD-10-CM | POA: Diagnosis not present

## 2019-12-05 MED ORDER — AZELASTINE HCL 0.15 % NA SOLN: NASAL | 5 refills | Status: DC

## 2019-12-05 NOTE — Telephone Encounter (Signed)
Patient notified and verbalize understanding.

## 2019-12-05 NOTE — Telephone Encounter (Signed)
Dr. Maudie Mercury please contact patient regarding her nasal spray she is confused. . Patient was upset because she was wanting a refill on her Azelastine nasal spray, but this medication was denied. Patient was given Atrovent to replace the medication. Patient states she had never received the medication of Atrovent and has been on her Azelastine. She said it is her choice on which medication she wants to be on because she has not had any issues with this medication. She wants this medication of Azelastine be [facility] as well as Atrovent cause she wants to choose to see which medication works well. There was already a script of Atrovent sent to the pharmacy on 11/11/2019 and she said it went to [facility] for [facility] day and she doesn't want it and not going to get it. Please advise on which medication she is currently on. Azelastine was D/C on 06/19/2019 due to side effects but patient denies any?

## 2019-12-05 NOTE — Telephone Encounter (Signed)
Please call patient.  She told me in her July visit that azelastine was not working so that's when we switched to Atrovent.  I sent in Azelastine again as she prefers it. She may use 1-2 sprays per nostril twice a day as needed for sinus drainage.  Hold the Atrovent nasal spray as that's for sinus drainage too. I wouldn't recommend using both at the same time.

## 2019-12-05 NOTE — Addendum Note (Signed)
Addended by: Garnet Sierras on: 12/05/2019 03:56 PM   Modules accepted: Orders

## 2019-12-06 ENCOUNTER — Ambulatory Visit: Payer: Medicare Other

## 2019-12-10 ENCOUNTER — Ambulatory Visit (INDEPENDENT_AMBULATORY_CARE_PROVIDER_SITE_OTHER): Payer: Medicare Other

## 2019-12-10 DIAGNOSIS — J309 Allergic rhinitis, unspecified: Secondary | ICD-10-CM | POA: Diagnosis not present

## 2019-12-18 ENCOUNTER — Other Ambulatory Visit: Payer: Self-pay | Admitting: *Deleted

## 2019-12-18 ENCOUNTER — Ambulatory Visit (INDEPENDENT_AMBULATORY_CARE_PROVIDER_SITE_OTHER): Payer: Medicare Other | Admitting: *Deleted

## 2019-12-18 DIAGNOSIS — J309 Allergic rhinitis, unspecified: Secondary | ICD-10-CM

## 2019-12-18 NOTE — Patient Outreach (Signed)
[facility]) Care Management  12/18/2019  Kimberly Lam 1946-03-30 UK:[id number]   RN Health Coach telephone call to patient.  Hipaa compliance verified. Per patient she is in the process of moving. Patient requested that I call her back in a few days.   Plan: RN will call back within 30 days [place] Management [phone]

## 2019-12-24 ENCOUNTER — Ambulatory Visit (INDEPENDENT_AMBULATORY_CARE_PROVIDER_SITE_OTHER): Payer: Medicare Other

## 2019-12-24 DIAGNOSIS — J309 Allergic rhinitis, unspecified: Secondary | ICD-10-CM

## 2019-12-31 ENCOUNTER — Other Ambulatory Visit: Payer: Self-pay | Admitting: Adult Health

## 2020-01-07 ENCOUNTER — Ambulatory Visit (INDEPENDENT_AMBULATORY_CARE_PROVIDER_SITE_OTHER): Payer: Medicare Other

## 2020-01-07 DIAGNOSIS — J309 Allergic rhinitis, unspecified: Secondary | ICD-10-CM

## 2020-01-08 ENCOUNTER — Other Ambulatory Visit: Payer: Self-pay | Admitting: Adult Health

## 2020-01-08 ENCOUNTER — Other Ambulatory Visit: Payer: Self-pay | Admitting: Allergy

## 2020-01-08 DIAGNOSIS — E119 Type 2 diabetes mellitus without complications: Secondary | ICD-10-CM

## 2020-01-08 DIAGNOSIS — M810 Age-related osteoporosis without current pathological fracture: Secondary | ICD-10-CM

## 2020-01-08 DIAGNOSIS — G4700 Insomnia, unspecified: Secondary | ICD-10-CM

## 2020-01-14 ENCOUNTER — Ambulatory Visit (INDEPENDENT_AMBULATORY_CARE_PROVIDER_SITE_OTHER): Payer: Medicare Other

## 2020-01-14 ENCOUNTER — Other Ambulatory Visit: Payer: Self-pay | Admitting: *Deleted

## 2020-01-14 DIAGNOSIS — J309 Allergic rhinitis, unspecified: Secondary | ICD-10-CM

## 2020-01-14 NOTE — Patient Outreach (Signed)
[facility]) Care Management  [place]  01/14/2020   Kimberly Lam Jan 20, 1946 [id number] RN Health Coach telephone call to patient.  Hipaa compliance verified. Patient was busy and asked for call back.  RN Health Coach telephone call to patient.  Hipaa compliance verified. Per patient she is doing good. They had just moved to a new home. Per patient she has been check her blood sugars 3-4 times a day. Patient is exercising daily. Per patient sometimes they do get food from take out fast food. RN discussed healthy choices. Patient does eat snacks. RN discussed healthy low carb snacks. Patient has agreed to follow up outreach calls.  Encounter Medications:  Outpatient Encounter Medications as of 01/14/2020  Medication Sig Note  . Accu-Chek FastClix Lancets MISC USE TO TEST BLOOD GLUCOSE 3 TO 4 TIMES DAILY   . Alcohol Swabs (B-D SINGLE USE SWABS REGULAR) PADS USE TO TEST BLOOD GLUCOSE FOUR TIMES DAILY   . alendronate (FOSAMAX) 70 MG tablet TAKE 1 TABLET BY MOUTH 1  TIME A WEEK. TAKE WITH A  FULL GLASS OF WATER ON AN  EMPTY STOMACH.   Marland Kitchen Ascorbic Acid (VITAMIN C) 1000 MG tablet Take 1,000 mg by mouth as needed. 04/11/2019: Patient states that she takes it daily   . Azelastine HCl 0.15 % SOLN Use 1-2 sprays per nostril twice a day as needed for sinus drainage.   Jolyne Loa Grape-Goldenseal (BERBERINE COMPLEX PO) Take by mouth daily.   . Blood Glucose Monitoring Suppl (ACCU-CHEK AVIVA PLUS) w/Device KIT    . cholecalciferol (VITAMIN D) 1000 units tablet Take 1,000 Units by mouth daily.   Marland Kitchen CINNAMON PO Take 1 tablet by mouth as needed.   . Cranberry 450 MG CAPS Take 1 capsule by mouth as needed. 04/11/2019: Patient takes it daily   . Cyanocobalamin (VITAMIN B-12 CR) 1500 MCG TBCR Take 1 tablet by mouth as needed. 04/11/2019: Patient takes once daily   . Echinacea-Goldenseal (ECHINACEA COMB/GOLDEN SEAL PO) Take 1 tablet by mouth as needed. Reported on 12/16/2015  03/14/2019: PRN   . fluticasone (FLONASE) 50 MCG/ACT nasal spray Place 1 spray into both nostrils 2 (two) times daily.   Marland Kitchen gabapentin (NEURONTIN) 300 MG capsule TAKE 1 CAPSULE BY MOUTH 4  TIMES DAILY   . glucose blood (ACCU-CHEK GUIDE) test strip USE TO TEST BLOOD GLUCOSE FOUR TIMES DAILY   . Green Tea, Camillia sinensis, (GREEN TEA PO) Take by mouth as needed. Reported on 12/16/2015 03/14/2019: PRN  . ipratropium (ATROVENT) 0.06 % nasal spray USE 2 SPRAYS IN BOTH  NOSTRILS 3 TIMES DAILY AS  NEEDED FOR RHINITIS   . Lactobacillus (ACIDOPHILUS PROBIOTIC PO) Take 1 tablet by mouth daily.   Marland Kitchen levocetirizine (XYZAL) 5 MG tablet TAKE 1 TABLET BY MOUTH  EVERY EVENING   . metFORMIN (GLUCOPHAGE-XR) 500 MG 24 hr tablet TAKE 2 TABLETS BY MOUTH  TWICE DAILY   . montelukast (SINGULAIR) 10 MG tablet Take 1 tablet (10 mg total) by mouth at bedtime.   . Omega-3 Fatty Acids (OMEGA 3 PO) Take 1 tablet by mouth as needed.   Vladimir Faster Glycol-Propyl Glycol (SYSTANE FREE OP) Apply 1 drop to eye daily.   . ramipril (ALTACE) 2.5 MG capsule TAKE 1 CAPSULE BY MOUTH  DAILY   . simvastatin (ZOCOR) 20 MG tablet TAKE 1 TABLET BY MOUTH  DAILY   . Spacer/Aero-Holding Chambers (AEROCHAMBER PLUS) inhaler Use as instructed with MDI   . Stinging Nettle 2 % POWD Take 1  tablet by mouth as needed. Reported on 12/16/2015   . traZODone (DESYREL) 50 MG tablet TAKE 1/2 TO 1 TABLET BY  MOUTH AT BEDTIME AS NEEDED. FOR SLEEP   . TURMERIC PO Take 800 mg by mouth as needed. Reported on 12/16/2015 04/11/2019: Patient takes dialy   No facility-administered encounter medications on file as of 01/14/2020.    Functional Status:  In your present state of health, do you have any difficulty performing the following activities: 01/14/2020 04/11/2019  Hearing? Y Y  Comment sometimes sounds muffled sounds like things said muffled  Vision? N N  Difficulty concentrating or making decisions? N N  Walking or climbing stairs? Y Y  Comment pain in knees  when she hasto climb alot of stairs kness hurt her when she has to climb stairs  Dressing or bathing? N N  Doing errands, shopping? N N  Preparing Food and eating ? N -  Using the Toilet? N -  In the past six months, have you accidently leaked urine? N -  Do you have problems with loss of bowel control? N -  Managing your Medications? N -  Managing your Finances? N -  Housekeeping or managing your Housekeeping? N -  Some recent data might be hidden    Fall/Depression Screening: Fall Risk  01/14/2020 08/16/2019 05/16/2019  Falls in the past year? 0 0 0  Number falls in past yr: 0 - -  Injury with Fall? 0 - -  Follow up Falls evaluation completed - -   PHQ 2/9 Scores 01/14/2020 04/11/2019 03/12/2019 10/18/2018 06/13/2017  PHQ - 2 Score 0 0 0 0 0   THN CM Care Plan Problem One     Most Recent Value  Care Plan Problem One  Knowledge deficit related to diease management  Role Documenting the Problem One  [place] for Problem One  Active  THN Long Term Goal   Patient will see a decrease in A1C  from 7.0 within the next 90 days  THN Long Term Goal Start Date  01/14/20  Interventions for Problem One Long Term Goal  RN discussed what the patient A1C is.RN discussedd healthy eating and monitoring snack intake. RN discussed exercise. RN will follow up with further discussion  THN CM Short Term Goal #1   Patient will verbalize having a better understanding of what snacks to eat for healthy choices within the next 90 days  THN CM Short Term Goal #1 Start Date  01/14/20  Interventions for Short Term Goal #1  RN discussed with patient about eating healthy low carb snackes. RN sent a list of low carb snacks to choose from. RN will follow up with further discussion  THN CM Short Term Goal #2   Patient will verbalize having a better understanding of healthy diabetic food choices to make when dining out within the next 90 days  THN CM Short Term Goal #2 Start Date  01/14/20  Interventions  for Short Term Goal #2  RN discussedd how often they get food ffrom fast food takeout. RN sent a list of healthy diabetic food choices . RN will follow up with further discussion      Assessment:  A1C 7.0 Patient is checking blood sugars 3-4 x day Patient would like a new meter Patient appetite is good Patient is exercising daily Patient will benefit from [place] telephonic outreach for education and support for diabetes self management.  Plan:  RN discussed healthy snacks RN sent a  list of low carb snacks RN discussed dining out at [place] sent a list of healthy choices from fast food restaurants RN sent request to NP for new meter and epi pen   RN will follow up within the month of April  [place] Management [phone]

## 2020-01-20 ENCOUNTER — Ambulatory Visit: Payer: Medicare Other

## 2020-01-20 DIAGNOSIS — J3089 Other allergic rhinitis: Secondary | ICD-10-CM | POA: Diagnosis not present

## 2020-01-20 NOTE — Progress Notes (Signed)
VIALS EXP 01-19-21 

## 2020-01-21 DIAGNOSIS — J301 Allergic rhinitis due to pollen: Secondary | ICD-10-CM | POA: Diagnosis not present

## 2020-01-22 DIAGNOSIS — J3081 Allergic rhinitis due to animal (cat) (dog) hair and dander: Secondary | ICD-10-CM | POA: Diagnosis not present

## 2020-01-28 ENCOUNTER — Ambulatory Visit (INDEPENDENT_AMBULATORY_CARE_PROVIDER_SITE_OTHER): Payer: Medicare Other

## 2020-01-28 DIAGNOSIS — J309 Allergic rhinitis, unspecified: Secondary | ICD-10-CM | POA: Diagnosis not present

## 2020-02-10 ENCOUNTER — Encounter: Payer: Self-pay | Admitting: Adult Health

## 2020-02-11 ENCOUNTER — Ambulatory Visit (INDEPENDENT_AMBULATORY_CARE_PROVIDER_SITE_OTHER): Payer: Medicare Other

## 2020-02-11 ENCOUNTER — Other Ambulatory Visit: Payer: Self-pay

## 2020-02-11 DIAGNOSIS — J309 Allergic rhinitis, unspecified: Secondary | ICD-10-CM | POA: Diagnosis not present

## 2020-02-12 ENCOUNTER — Encounter: Payer: Self-pay | Admitting: Adult Health

## 2020-02-12 ENCOUNTER — Encounter: Payer: Self-pay | Admitting: Internal Medicine

## 2020-02-12 ENCOUNTER — Ambulatory Visit (INDEPENDENT_AMBULATORY_CARE_PROVIDER_SITE_OTHER): Payer: Medicare Other | Admitting: Adult Health

## 2020-02-12 VITALS — BP 118/60 | Temp 97.6°F | Wt 130.0 lb

## 2020-02-12 DIAGNOSIS — M810 Age-related osteoporosis without current pathological fracture: Secondary | ICD-10-CM

## 2020-02-12 DIAGNOSIS — G4700 Insomnia, unspecified: Secondary | ICD-10-CM | POA: Diagnosis not present

## 2020-02-12 DIAGNOSIS — E1169 Type 2 diabetes mellitus with other specified complication: Secondary | ICD-10-CM | POA: Diagnosis not present

## 2020-02-12 DIAGNOSIS — Z1211 Encounter for screening for malignant neoplasm of colon: Secondary | ICD-10-CM

## 2020-02-12 DIAGNOSIS — E119 Type 2 diabetes mellitus without complications: Secondary | ICD-10-CM | POA: Diagnosis not present

## 2020-02-12 DIAGNOSIS — Z0000 Encounter for general adult medical examination without abnormal findings: Secondary | ICD-10-CM

## 2020-02-12 DIAGNOSIS — E785 Hyperlipidemia, unspecified: Secondary | ICD-10-CM

## 2020-02-12 MED ORDER — ACCU-CHEK GUIDE VI STRP: ORAL_STRIP | 3 refills | Status: DC

## 2020-02-12 MED ORDER — ACCU-CHEK GUIDE W/DEVICE KIT: 1.0000 | PACK | Freq: Once | 0 refills | Status: DC

## 2020-02-12 MED ORDER — ACCU-CHEK GUIDE W/DEVICE KIT: 1.0000 | PACK | Freq: Once | 0 refills | Status: AC

## 2020-02-12 NOTE — Progress Notes (Signed)
Subjective:    Patient ID: Kimberly Lam, female    DOB: [birth year], 74 y.o.   MRN: [id number]  HPI Patient presents for yearly preventative medicine examination. She is a pleasant 74 year old female who  has a past medical history of Acid reflux, Allergic rhinitis, Anemia, Asthma, Bilateral sciatica, Chronic bilateral low back pain, Colon polyps, Diverticulosis, DM type 2 (diabetes mellitus, type 2) ([place]), Environmental allergies, GERD (gastroesophageal reflux disease), Hyperlipidemia, Low back pain, and Migraine.  DM -she takes Metformin 500 mg extended release twice daily.  She takes ramipril 2.5 mg for kidney protection.  She does monitor her blood sugars at home and per her log her average reading is in the 130's.  She denies issues with hypoglycemia. Lab Results  Component Value Date   HGBA1C 7.0 (H) 11/06/2019   Hyperlipidemia -currently well controlled in the past on simvastatin 20 mg daily.  She denies myalgia or fatigue Lab Results  Component Value Date   CHOL 166 10/18/2018   HDL 68.20 10/18/2018   LDLCALC 79 10/18/2018   TRIG 91.0 10/18/2018   CHOLHDL 2 10/18/2018   Osteoporosis -takes Fosamax weekly and vitamin D 1000 mg daily. .  Her last bone density screen was in 2018 which showed a T score of -2.0 in the spine and -1.9 in the right femur neck.  Insomnia - takes Trazodone QHS PRN   All immunizations and health maintenance protocols were reviewed with the patient and needed orders were placed.  She is due for PPSV 23 and seasonal flu vaccination.   Appropriate screening laboratory values were ordered for the patient including screening of hyperlipidemia, renal function and hepatic function.  Medication reconciliation,  past medical history, social history, problem list and allergies were reviewed in detail with the patient  Goals were established with regard to weight loss, exercise, and  diet in compliance with medications  End of life planning was  discussed.  She is due for routine screening colonoscopy    Review of Systems  Constitutional: Negative.   HENT: Negative.   Eyes: Negative.   Respiratory: Negative.   Cardiovascular: Negative.   Gastrointestinal: Negative.   Endocrine: Negative.   Genitourinary: Negative.   Musculoskeletal: Negative.   Skin: Negative.   Allergic/Immunologic: Negative.   Neurological: Negative.   Hematological: Negative.   Psychiatric/Behavioral: Negative.    Past Medical History:  Diagnosis Date  . Acid reflux   . Allergic rhinitis   . Anemia   . Asthma   . Bilateral sciatica   . Chronic bilateral low back pain   . Colon polyps   . Diverticulosis   . DM type 2 (diabetes mellitus, type 2) ([place])   . Environmental allergies    flowers, pollen, trees  . GERD (gastroesophageal reflux disease)   . Hyperlipidemia   . Low back pain   . Migraine     Social History   Socioeconomic History  . Marital status: Married    Spouse name: Not on file  . Number of children: 2  . Years of education: 88  . Highest education level: Not on file  Occupational History  . Occupation: Retired  Tobacco Use  . Smoking status: Never Smoker  . Smokeless tobacco: Never Used  Substance and Sexual Activity  . Alcohol use: No    Alcohol/week: 0.0 standard drinks  . Drug use: No  . Sexual activity: Not on file  Other Topics Concern  . Not on file  Social History Narrative   **  Merged History Encounter **       ** Data from: 11/23/15 Enc Dept: [facility]   Married 1 son one daughter, relocated to [place] after living in [place] for many years. She is a Writer of [employer]. No caffeine.       ** Data from: 11/25/15 Enc Dept: [facility]   Lives at home with her husband and brother. Right-handed. No caffeine use.   Social Determinants of Health   Financial Resource Strain:   . Difficulty of Paying Living Expenses: Not on file  Food Insecurity: No Food  Insecurity  . Worried About Charity fundraiser in the Last Year: Never true  . Ran Out of Food in the Last Year: Never true  Transportation Needs: No Transportation Needs  . Lack of Transportation (Medical): No  . Lack of Transportation (Non-Medical): No  Physical Activity:   . Days of Exercise per Week: Not on file  . Minutes of Exercise per Session: Not on file  Stress:   . Feeling of Stress : Not on file  Social Connections:   . Frequency of Communication with Friends and Family: Not on file  . Frequency of Social Gatherings with Friends and Family: Not on file  . Attends Religious Services: Not on file  . Active Member of Clubs or Organizations: Not on file  . Attends Archivist Meetings: Not on file  . Marital Status: Not on file  Intimate Partner Violence:   . Fear of Current or Ex-Partner: Not on file  . Emotionally Abused: Not on file  . Physically Abused: Not on file  . Sexually Abused: Not on file    Past Surgical History:  Procedure Laterality Date  . CATARACT EXTRACTION, BILATERAL Bilateral 2012  . CESAREAN SECTION     x 2  . DILATION AND CURETTAGE OF UTERUS    . NASAL SINUS SURGERY    . TUBAL LIGATION      Family History  Problem Relation Age of Onset  . Asthma Father   . Emphysema Father   . Alcohol abuse Father   . Diabetes Mellitus II Brother        x 2  . Hyperlipidemia Brother   . Hypertension Brother   . Pancreatic cancer Brother   . Prostate cancer Brother   . Heart disease Maternal Grandmother        hardening of the arteries  . Heart murmur Daughter   . Glaucoma Brother   . Breast cancer Neg Hx     Allergies  Allergen Reactions  . Peanut-Containing Drug Products Anaphylaxis  . Milk-Related Compounds Nausea And Vomiting    Milk and cheese  . Actos [Pioglitazone] Nausea Only  . Soy Allergy Nausea Only    Pt states had allergy testing and was told allergic to soy-causes nausea but no other reactions  . Vicodin  [Hydrocodone-Acetaminophen] Nausea Only    Current Outpatient Medications on File Prior to Visit  Medication Sig Dispense Refill  . Accu-Chek FastClix Lancets MISC USE TO TEST BLOOD GLUCOSE 3 TO 4 TIMES DAILY 400 each 3  . Alcohol Swabs (B-D SINGLE USE SWABS REGULAR) PADS USE TO TEST BLOOD GLUCOSE FOUR TIMES DAILY 200 each 3  . alendronate (FOSAMAX) 70 MG tablet TAKE 1 TABLET BY MOUTH 1  TIME A WEEK. TAKE WITH A  FULL GLASS OF WATER ON AN  EMPTY STOMACH. 12 tablet 3  . Ascorbic Acid (VITAMIN C) 1000 MG tablet Take  1,000 mg by mouth as needed.    . Azelastine HCl 0.15 % SOLN Use 1-2 sprays per nostril twice a day as needed for sinus drainage. 30 mL 5  . Barberry-Oreg Grape-Goldenseal (BERBERINE COMPLEX PO) Take by mouth daily.    . Blood Glucose Monitoring Suppl (ACCU-CHEK AVIVA PLUS) w/Device KIT     . cholecalciferol (VITAMIN D) 1000 units tablet Take 1,000 Units by mouth daily.    Marland Kitchen CINNAMON PO Take 1 tablet by mouth as needed.    . Cranberry 450 MG CAPS Take 1 capsule by mouth as needed.    . Cyanocobalamin (VITAMIN B-12 CR) 1500 MCG TBCR Take 1 tablet by mouth as needed.    . Echinacea-Goldenseal (ECHINACEA COMB/GOLDEN SEAL PO) Take 1 tablet by mouth as needed. Reported on 12/16/2015    . fluticasone (FLONASE) 50 MCG/ACT nasal spray Place 1 spray into both nostrils 2 (two) times daily. 48 g 1  . gabapentin (NEURONTIN) 300 MG capsule TAKE 1 CAPSULE BY MOUTH 4  TIMES DAILY 360 capsule 3  . glucose blood (ACCU-CHEK GUIDE) test strip USE TO TEST BLOOD GLUCOSE FOUR TIMES DAILY 400 each 3  . Green Tea, Camillia sinensis, (GREEN TEA PO) Take by mouth as needed. Reported on 12/16/2015    . ipratropium (ATROVENT) 0.06 % nasal spray USE 2 SPRAYS IN BOTH  NOSTRILS 3 TIMES DAILY AS  NEEDED FOR RHINITIS 90 mL 0  . Lactobacillus (ACIDOPHILUS PROBIOTIC PO) Take 1 tablet by mouth daily.    Marland Kitchen levocetirizine (XYZAL) 5 MG tablet TAKE 1 TABLET BY MOUTH  EVERY EVENING 90 tablet 1  . metFORMIN (GLUCOPHAGE-XR)  500 MG 24 hr tablet TAKE 2 TABLETS BY MOUTH  TWICE DAILY 360 tablet 3  . montelukast (SINGULAIR) 10 MG tablet Take 1 tablet (10 mg total) by mouth at bedtime. 90 tablet 1  . Omega-3 Fatty Acids (OMEGA 3 PO) Take 1 tablet by mouth as needed.    Vladimir Faster Glycol-Propyl Glycol (SYSTANE FREE OP) Apply 1 drop to eye daily.    . ramipril (ALTACE) 2.5 MG capsule TAKE 1 CAPSULE BY MOUTH  DAILY 90 capsule 3  . simvastatin (ZOCOR) 20 MG tablet TAKE 1 TABLET BY MOUTH  DAILY 90 tablet 3  . Spacer/Aero-Holding Chambers (AEROCHAMBER PLUS) inhaler Use as instructed with MDI 1 each 2  . Stinging Nettle 2 % POWD Take 1 tablet by mouth as needed. Reported on 12/16/2015    . traZODone (DESYREL) 50 MG tablet TAKE 1/2 TO 1 TABLET BY  MOUTH AT BEDTIME AS NEEDED. FOR SLEEP 90 tablet 3  . TURMERIC PO Take 800 mg by mouth as needed. Reported on 12/16/2015     No current facility-administered medications on file prior to visit.    BP 118/60   Temp 97.6 F (36.4 C) (Temporal)   Wt 130 lb (59 kg)   BMI 22.31 kg/m       Objective:   Physical Exam Vitals and nursing note reviewed.  Constitutional:      General: She is not in acute distress.    Appearance: Normal appearance. She is well-developed. She is not ill-appearing.  HENT:     Head: Normocephalic and atraumatic.     Right Ear: Tympanic membrane, ear canal and external ear normal. There is no impacted cerumen.     Left Ear: Tympanic membrane, ear canal and external ear normal. There is no impacted cerumen.     Nose: Nose normal. No congestion or rhinorrhea.     Mouth/Throat:  Mouth: Mucous membranes are moist.     Pharynx: Oropharynx is clear. No oropharyngeal exudate or posterior oropharyngeal erythema.  Eyes:     General:        Right eye: No discharge.        Left eye: No discharge.     Extraocular Movements: Extraocular movements intact.     Conjunctiva/sclera: Conjunctivae normal.     Pupils: Pupils are equal, round, and reactive to  light.  Neck:     Thyroid: No thyromegaly.     Vascular: No carotid bruit.     Trachea: No tracheal deviation.  Cardiovascular:     Rate and Rhythm: Normal rate and regular rhythm.     Pulses: Normal pulses.     Heart sounds: Normal heart sounds. No murmur. No friction rub. No gallop.   Pulmonary:     Effort: Pulmonary effort is normal. No respiratory distress.     Breath sounds: Normal breath sounds. No stridor. No wheezing, rhonchi or rales.  Chest:     Chest wall: No tenderness.  Abdominal:     General: Abdomen is flat. Bowel sounds are normal. There is no distension.     Palpations: Abdomen is soft. There is no mass.     Tenderness: There is no abdominal tenderness. There is no right CVA tenderness, left CVA tenderness, guarding or rebound.     Hernia: No hernia is present.  Musculoskeletal:        General: No swelling, tenderness, deformity or signs of injury. Normal range of motion.     Cervical back: Normal range of motion and neck supple.     Right lower leg: No edema.     Left lower leg: No edema.  Lymphadenopathy:     Cervical: No cervical adenopathy.  Skin:    General: Skin is warm and dry.     Coloration: Skin is not jaundiced or pale.     Findings: No bruising, erythema, lesion or rash.  Neurological:     General: No focal deficit present.     Mental Status: She is alert and oriented to person, place, and time.     Cranial Nerves: No cranial nerve deficit.     Sensory: No sensory deficit.     Motor: No weakness.     Coordination: Coordination normal.     Gait: Gait normal.     Deep Tendon Reflexes: Reflexes normal.  Psychiatric:        Mood and Affect: Mood normal.        Behavior: Behavior normal.        Thought Content: Thought content normal.        Judgment: Judgment normal.       Assessment & Plan:  1. Routine general medical examination at a health care facility - Continue to stay active and eat healthy  - Follow up in one year or sooner if  needed - CBC with Differential/Platelet - Comprehensive metabolic panel - Hemoglobin A1c - Lipid panel - TSH  2. Diabetes mellitus without complication ([place]) - Consider adding glipizide - Follow up in three months  - CBC with Differential/Platelet - Comprehensive metabolic panel - Hemoglobin A1c - Lipid panel - TSH  3. Hyperlipidemia associated with type 2 diabetes mellitus ([place]) - Consider increase in statin  - CBC with Differential/Platelet - Comprehensive metabolic panel - Hemoglobin A1c - Lipid panel - TSH  4. Insomnia, unspecified type - Continue with trazodone PRN  - CBC with Differential/Platelet - Comprehensive metabolic  panel - Hemoglobin A1c - Lipid panel - TSH  5. Age-related osteoporosis without current pathological fracture  - Vitamin D, 25-hydroxy - DG Bone Density; Future  6. Colon cancer screening  - Ambulatory referral to Gastroenterology   [name], NP

## 2020-02-12 NOTE — Addendum Note (Signed)
Addended by: Miles Costain T on: 02/12/2020 03:24 PM   Modules accepted: Orders

## 2020-02-12 NOTE — Patient Instructions (Signed)
It was great seeing you today   Someone from gastroenterology will call you to schedule your colonoscopy   Call the breast center and schedule your bone density screen   We will follow up with you regarding your blood work

## 2020-02-13 ENCOUNTER — Other Ambulatory Visit: Payer: Self-pay

## 2020-02-13 ENCOUNTER — Other Ambulatory Visit: Payer: Medicare Other

## 2020-02-13 LAB — CBC WITH DIFFERENTIAL/PLATELET
Basophils Absolute: 0 10*3/uL (ref 0.0–0.1)
Basophils Relative: 1 % (ref 0.0–3.0)
Eosinophils Absolute: 0.1 10*3/uL (ref 0.0–0.7)
Eosinophils Relative: 3.1 % (ref 0.0–5.0)
HCT: 39.8 % (ref 36.0–46.0)
Hemoglobin: 13.5 g/dL (ref 12.0–15.0)
Lymphocytes Relative: 40.9 % (ref 12.0–46.0)
Lymphs Abs: 1.2 10*3/uL (ref 0.7–4.0)
MCHC: 34 g/dL (ref 30.0–36.0)
MCV: 92 fl (ref 78.0–100.0)
Monocytes Absolute: 0.4 10*3/uL (ref 0.1–1.0)
Monocytes Relative: 13.4 % — ABNORMAL HIGH (ref 3.0–12.0)
Neutro Abs: 1.2 10*3/uL — ABNORMAL LOW (ref 1.4–7.7)
Neutrophils Relative %: 41.6 % — ABNORMAL LOW (ref 43.0–77.0)
Platelets: 213 10*3/uL (ref 150.0–400.0)
RBC: 4.33 Mil/uL (ref 3.87–5.11)
RDW: 13.5 % (ref 11.5–15.5)
WBC: 2.9 10*3/uL — ABNORMAL LOW (ref 4.0–10.5)

## 2020-02-13 LAB — COMPREHENSIVE METABOLIC PANEL
ALT: 15 U/L (ref 0–35)
AST: 15 U/L (ref 0–37)
Albumin: 4.2 g/dL (ref 3.5–5.2)
Alkaline Phosphatase: 52 U/L (ref 39–117)
BUN: 12 mg/dL (ref 6–23)
CO2: 30 mEq/L (ref 19–32)
Calcium: 10 mg/dL (ref 8.4–10.5)
Chloride: 101 mEq/L (ref 96–112)
Creatinine, Ser: 0.87 mg/dL (ref 0.40–1.20)
GFR: 77.35 mL/min (ref >60.00)
Glucose, Bld: 141 mg/dL — ABNORMAL HIGH (ref 70–99)
Potassium: 4.7 mEq/L (ref 3.5–5.1)
Sodium: 137 mEq/L (ref 135–145)
Total Bilirubin: 0.4 mg/dL (ref 0.2–1.2)
Total Protein: 7 g/dL (ref 6.0–8.3)

## 2020-02-13 LAB — LIPID PANEL
Cholesterol: 159 mg/dL (ref 0–200)
HDL: 58.5 mg/dL (ref >39.00)
LDL Cholesterol: 78 mg/dL (ref 0–99)
NonHDL: 100.26
Total CHOL/HDL Ratio: 3
Triglycerides: 111 mg/dL (ref 0.0–149.0)
VLDL: 22.2 mg/dL (ref 0.0–40.0)

## 2020-02-13 LAB — TSH: TSH: 2.59 u[IU]/mL (ref 0.35–4.50)

## 2020-02-13 LAB — VITAMIN D 25 HYDROXY (VIT D DEFICIENCY, FRACTURES): VITD: 70.08 ng/mL (ref 30.00–100.00)

## 2020-02-14 LAB — HEMOGLOBIN A1C: Hgb A1c MFr Bld: 7.4 % — ABNORMAL HIGH (ref 4.6–6.5)

## 2020-02-14 IMAGING — XA Imaging study
2 series · 2 of 2 positions shown · non-contrast
Comparison: No recent imaging is been performed.

CLINICAL DATA: Lumbosacral spondylosis without myelopathy.
BILATERAL radicular symptoms.

FLUOROSCOPY TIME:  24 seconds corresponding to a Dose Area Product
of 31.97 Gy*m2

[Series 2: ortho standard · 1 of 1 slices shown (1 of 2)]
[im 1/1]
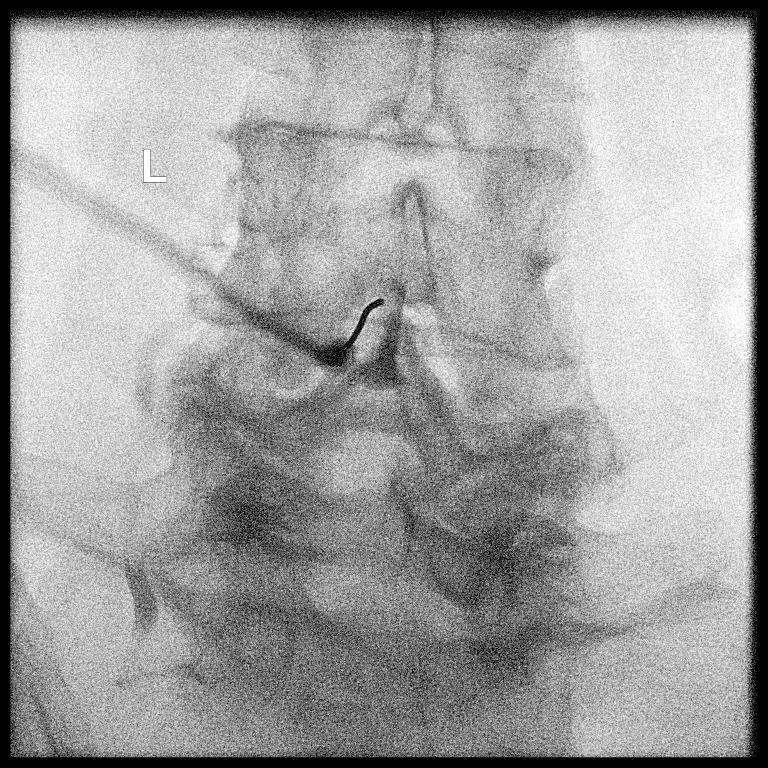

[Series 3: ortho standard · 1 of 1 slices shown (2 of 2)]
[im 1/1]
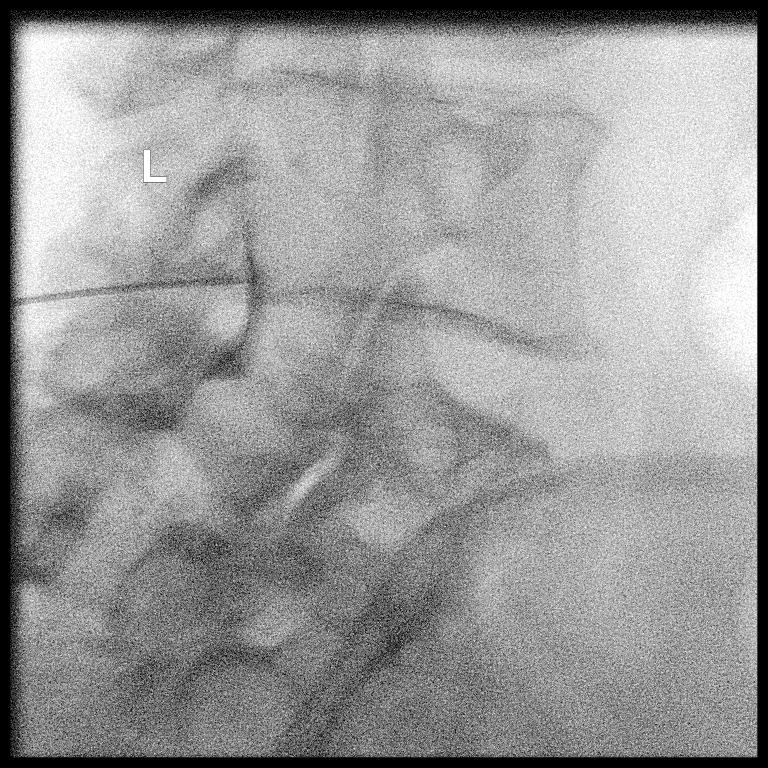

[2 of 2 positions shown; findings below may reference images not displayed]

There is an MRI
from 12/03/2015. Transitional anatomy is present.

PROCEDURE:
The procedure, risks, benefits, and alternatives were explained to
the patient. Questions regarding the procedure were encouraged and
answered. The patient understands and consents to the procedure.
Timeout was performed.

Lateral fluoroscopy today shows progression of anterolisthesis at
the L5-L6 level when compared with prior MR over 3 years ago.
Without recent imaging, attempts at epidural injection could not be
performed at this level. Epidural was performed at the level above.

LUMBAR EPIDURAL INJECTION:

An interlaminar approach was performed on LEFT/midline at L4-5. The
overlying skin was cleansed and anesthetized. A 20 gauge epidural
needle was advanced using loss-of-resistance technique.

DIAGNOSTIC EPIDURAL INJECTION:

Injection of Isovue-M 200 shows a good epidural pattern with spread
above and below the level of needle placement, primarily on the
LEFT/midline no vascular opacification is seen.

THERAPEUTIC EPIDURAL INJECTION:

120.2 mg of Depo-Medrol mixed with 2 mL 1% lidocaine were instilled.
The procedure was well-tolerated, and the patient was discharged
thirty minutes following the injection in good condition.

COMPLICATIONS:
None
IMPRESSION: Technically successful epidural injection on the LEFT L4-5 #1.

## 2020-02-19 ENCOUNTER — Ambulatory Visit: Payer: Medicare Other | Admitting: Allergy

## 2020-02-19 NOTE — Progress Notes (Signed)
I have reviewed and agreed above plan. 

## 2020-02-19 NOTE — Progress Notes (Deleted)
Follow Up Note  RE: Kimberly Lam MRN: [id number] DOB: [birth year] Date of Office Visit: 02/19/2020  Referring provider: [name], NP Primary care provider: [name], NP  Chief Complaint: No chief complaint on file.  History of Present Illness: I had the pleasure of seeing Kimberly Lam for a follow up visit at the [facility] on 02/19/2020. She is a 74 y.o. female, who is being followed for allergic rhinoconjunctivitis, asthma, nasal septum perforation, GERD, food allergies. Her previous allergy office visit was on 10/21/2019 with Dr. Maudie Mercury. Today is a regular follow up visit.  Perennial and seasonal allergic rhinoconjunctivitis Past history - allergy immunotherapy for tree/grass/weed/feather/cockroach/cat/dog/mold/dust mites since 2016. Interim history - stable.   Continue environmental control measures.  Continue allergy injections every 2 weeks.   May use over the counter antihistamines such as Zyrtec (cetirizine), Claritin (loratadine), Allegra (fexofenadine), or Xyzal (levocetirizine) daily as needed without the D.  Continue Flonase 1 spray twice a day.   Continue Atrovent 2 sprays in each nostril 3 times a day as needed for drainage.   Nasal saline spray (i.e., Simply Saline) or nasal saline lavage (i.e., NeilMed) is recommended as needed and prior to medicated nasal sprays.  Continue montelukast 10mg  daily.  Moderate persistent asthma, uncomplicated Past history - Felt better with [facility] but could not afford to purchase after samples ran out. Interim history - stopped all daily maintenance inhaler with no worsening symptoms. Declines spirometry today due to cost. ACT score 24.   Daily controller medication(s):  Continue Singulair 10mg  daily.   Prior to physical activity:May use albuterol rescue inhaler 2 puffs 5 to 15 minutes prior to strenuous physical activities.  Rescue medications:May use albuterol rescue inhaler 2 puffs or  nebulizer every 4 to 6 hours as needed for shortness of breath, chest tightness, coughing, and wheezing. Monitor frequency of use.   Get spirometry at next visit.   Nasal septal perforation Saw ENT - no intervention for now.   Monitor symptoms.    Gastroesophageal reflux disease  Continue taking omeprazole 40mg  daily.  Anaphylactic shock due to adverse food reaction Interim history - Patient did not pick up Epipen due to cost. Soy causes increased nasal symptoms.   Continue to avoid peanuts, cow's milk, soybean.  For mild symptoms you can take over the counter antihistamines such as Benadryl and monitor symptoms closely. If symptoms worsen or if you have severe symptoms including breathing issues, throat closure, significant swelling, whole body hives, severe diarrhea and vomiting, lightheadedness then seek immediate medical care.  Return in about 4 months (around 02/18/2020).  Assessment and Plan: Kimberly Lam is a 74 y.o. female with: No problem-specific Assessment & Plan notes found for this encounter.  No follow-ups on file.  No orders of the defined types were placed in this encounter.  Lab Orders  No laboratory test(s) ordered today    Diagnostics: Spirometry:  Tracings reviewed. Her effort: {Blank single:19197::"Good reproducible efforts.","It was hard to get consistent efforts and there is a question as to whether this reflects a maximal maneuver.","Poor effort, data can not be interpreted."} FVC: ***L FEV1: ***L, ***% predicted FEV1/FVC ratio: ***% Interpretation: {Blank single:19197::"Spirometry consistent with mild obstructive disease","Spirometry consistent with moderate obstructive disease","Spirometry consistent with severe obstructive disease","Spirometry consistent with possible restrictive disease","Spirometry consistent with mixed obstructive and restrictive disease","Spirometry uninterpretable due to technique","Spirometry consistent with normal pattern","No overt  abnormalities noted given today's efforts"}.  Please see scanned spirometry results for details.  Skin Testing: {Blank single:19197::"Select foods","Environmental  allergy panel","Environmental allergy panel and select foods","Food allergy panel","None","Deferred due to recent antihistamines use"}. Positive test to: ***. Negative test to: ***.  Results discussed with patient/family.   Medication List:  Current Outpatient Medications  Medication Sig Dispense Refill  . Alcohol Swabs (B-D SINGLE USE SWABS REGULAR) PADS USE TO TEST BLOOD GLUCOSE FOUR TIMES DAILY 200 each 3  . alendronate (FOSAMAX) 70 MG tablet TAKE 1 TABLET BY MOUTH 1  TIME A WEEK. TAKE WITH A  FULL GLASS OF WATER ON AN  EMPTY STOMACH. 12 tablet 3  . Ascorbic Acid (VITAMIN C) 1000 MG tablet Take 1,000 mg by mouth as needed.    . Azelastine HCl 0.15 % SOLN Use 1-2 sprays per nostril twice a day as needed for sinus drainage. 30 mL 5  . Barberry-Oreg Grape-Goldenseal (BERBERINE COMPLEX PO) Take by mouth daily.    . cholecalciferol (VITAMIN D) 1000 units tablet Take 1,000 Units by mouth daily.    Marland Kitchen CINNAMON PO Take 1 tablet by mouth as needed.    . Cranberry 450 MG CAPS Take 1 capsule by mouth as needed.    . Cyanocobalamin (VITAMIN B-12 CR) 1500 MCG TBCR Take 1 tablet by mouth as needed.    . Echinacea-Goldenseal (ECHINACEA COMB/GOLDEN SEAL PO) Take 1 tablet by mouth as needed. Reported on 12/16/2015    . fluticasone (FLONASE) 50 MCG/ACT nasal spray Place 1 spray into both nostrils 2 (two) times daily. 48 g 1  . gabapentin (NEURONTIN) 300 MG capsule TAKE 1 CAPSULE BY MOUTH 4  TIMES DAILY 360 capsule 3  . glucose blood (ACCU-CHEK GUIDE) test strip USE TO TEST BLOOD GLUCOSE FOUR TIMES DAILY 400 each 3  . Green Tea, Camillia sinensis, (GREEN TEA PO) Take by mouth as needed. Reported on 12/16/2015    . ipratropium (ATROVENT) 0.06 % nasal spray USE 2 SPRAYS IN BOTH  NOSTRILS 3 TIMES DAILY AS  NEEDED FOR RHINITIS 90 mL 0  . Lactobacillus  (ACIDOPHILUS PROBIOTIC PO) Take 1 tablet by mouth daily.    Marland Kitchen levocetirizine (XYZAL) 5 MG tablet TAKE 1 TABLET BY MOUTH  EVERY EVENING 90 tablet 1  . metFORMIN (GLUCOPHAGE-XR) 500 MG 24 hr tablet TAKE 2 TABLETS BY MOUTH  TWICE DAILY 360 tablet 3  . montelukast (SINGULAIR) 10 MG tablet Take 1 tablet (10 mg total) by mouth at bedtime. 90 tablet 1  . Omega-3 Fatty Acids (OMEGA 3 PO) Take 1 tablet by mouth as needed.    Vladimir Faster Glycol-Propyl Glycol (SYSTANE FREE OP) Apply 1 drop to eye daily.    . ramipril (ALTACE) 2.5 MG capsule TAKE 1 CAPSULE BY MOUTH  DAILY 90 capsule 3  . simvastatin (ZOCOR) 20 MG tablet TAKE 1 TABLET BY MOUTH  DAILY 90 tablet 3  . Spacer/Aero-Holding Chambers (AEROCHAMBER PLUS) inhaler Use as instructed with MDI 1 each 2  . Stinging Nettle 2 % POWD Take 1 tablet by mouth as needed. Reported on 12/16/2015    . traZODone (DESYREL) 50 MG tablet TAKE 1/2 TO 1 TABLET BY  MOUTH AT BEDTIME AS NEEDED. FOR SLEEP 90 tablet 3  . TURMERIC PO Take 800 mg by mouth as needed. Reported on 12/16/2015    . VENTOLIN HFA 108 (90 Base) MCG/ACT inhaler Inhale 2 puffs into the lungs every 6 (six) hours as needed for wheezing or shortness of breath.     No current facility-administered medications for this visit.   Allergies: Allergies  Allergen Reactions  . Peanut-Containing Drug Products Anaphylaxis  .  Milk-Related Compounds Nausea And Vomiting    Milk and cheese  . Actos [Pioglitazone] Nausea Only  . Soy Allergy Nausea Only    Pt states had allergy testing and was told allergic to soy-causes nausea but no other reactions  . Vicodin [Hydrocodone-Acetaminophen] Nausea Only   I reviewed her past medical history, social history, family history, and environmental history and no significant changes have been reported from her previous visit.  Review of Systems  Constitutional: Negative for appetite change, chills, fever and unexpected weight change.  HENT: Positive for postnasal drip.  Negative for congestion and rhinorrhea.   Eyes: Negative for itching.  Respiratory: Negative for cough, chest tightness, shortness of breath and wheezing.   Gastrointestinal: Negative for abdominal pain.  Skin: Negative for rash.  Allergic/Immunologic: Positive for environmental allergies and food allergies.  Neurological: Negative for headaches.   Objective: There were no vitals taken for this visit. There is no height or weight on file to calculate BMI. Physical Exam  Constitutional: She is oriented to person, place, and time. She appears well-developed and well-nourished.  HENT:  Head: Normocephalic and atraumatic.  Right Ear: External ear normal.  Left Ear: External ear normal.  Mouth/Throat: Oropharynx is clear and moist.  Septal perforation  Eyes: Conjunctivae and EOM are normal.  Cardiovascular: Normal rate, regular rhythm and normal heart sounds. Exam reveals no gallop and no friction rub.  No murmur heard. Pulmonary/Chest: Effort normal and breath sounds normal. She has no wheezes. She has no rales.  Musculoskeletal:     Cervical back: Neck supple.  Neurological: She is alert and oriented to person, place, and time.  Skin: Skin is warm. No rash noted.  Psychiatric: She has a normal mood and affect. Her behavior is normal.  Nursing note and vitals reviewed.  Previous notes and tests were reviewed. The plan was reviewed with the patient/family, and all questions/concerned were addressed.  It was my pleasure to see Anela today and participate in her care. Please feel free to contact me with any questions or concerns.  Sincerely,  Rexene Alberts, DO Allergy & Immunology  [facility] office: [phone] [facility] office: [place] office: [phone]

## 2020-02-20 ENCOUNTER — Ambulatory Visit (INDEPENDENT_AMBULATORY_CARE_PROVIDER_SITE_OTHER): Payer: Medicare Other | Admitting: Podiatry

## 2020-02-20 ENCOUNTER — Other Ambulatory Visit: Payer: Self-pay

## 2020-02-20 ENCOUNTER — Encounter: Payer: Self-pay | Admitting: Podiatry

## 2020-02-20 VITALS — Temp 98.0°F

## 2020-02-20 DIAGNOSIS — M79675 Pain in left toe(s): Secondary | ICD-10-CM | POA: Diagnosis not present

## 2020-02-20 DIAGNOSIS — M79674 Pain in right toe(s): Secondary | ICD-10-CM | POA: Diagnosis not present

## 2020-02-20 DIAGNOSIS — B351 Tinea unguium: Secondary | ICD-10-CM | POA: Diagnosis not present

## 2020-02-20 DIAGNOSIS — L600 Ingrowing nail: Secondary | ICD-10-CM | POA: Diagnosis not present

## 2020-02-20 NOTE — Patient Instructions (Signed)

## 2020-02-20 NOTE — Progress Notes (Signed)
Subjective:   Patient ID: Kimberly Lam, female   DOB: 74 y.o.   MRN: [id number]   HPI Patient presents with 2 different problems with 1 being thickened dystrophic nailbeds 1-5 both feet that are worked on periodically and the second being the medial border of the left hallux being ingrown and painful and she is concerned about being able to wear shoe gear and whether or not this should be fixed permanently and treats it as a separate problem.  Patient does not smoke neuro   ROS      Objective:  Physical Exam  Vascular status intact with patient found to have mild diminishment sharp dull vibratory.  I noted on the medial border left hallux is incurvated and it is mildly sore but I did not note erythema edema or any kind of drainage at the current time.  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  I debrided all nailbeds 1-5 both feet with no iatrogenic bleeding currently

## 2020-02-25 ENCOUNTER — Ambulatory Visit (INDEPENDENT_AMBULATORY_CARE_PROVIDER_SITE_OTHER): Payer: Medicare Other

## 2020-02-25 DIAGNOSIS — J309 Allergic rhinitis, unspecified: Secondary | ICD-10-CM | POA: Diagnosis not present

## 2020-02-28 ENCOUNTER — Ambulatory Visit: Payer: Medicare Other

## 2020-03-02 ENCOUNTER — Other Ambulatory Visit: Payer: Self-pay

## 2020-03-02 ENCOUNTER — Ambulatory Visit (AMBULATORY_SURGERY_CENTER): Payer: Self-pay | Admitting: *Deleted

## 2020-03-02 VITALS — Temp 97.5°F | Ht 64.0 in | Wt 130.0 lb

## 2020-03-02 DIAGNOSIS — Z1211 Encounter for screening for malignant neoplasm of colon: Secondary | ICD-10-CM

## 2020-03-02 DIAGNOSIS — Z01818 Encounter for other preprocedural examination: Secondary | ICD-10-CM

## 2020-03-02 NOTE — Progress Notes (Signed)
egg or soy allergy per patient of GI upset, nausea- no anaphylactic issues  - eats cakes and pies but only small amounts but no issues   issues with past sedation with any surgeries  or procedures, no intubation problems - hard to sedate per pt - with c section in the past not sedated and had to increase sedation - phx of PONV  No diet pills per patient No home 02 use per patient  No blood thinners per patient  Pt denies issues with constipation  No A fib or A flutter  EMMI video sent to pt's e mail   Due to the COVID-19 pandemic we are asking patients to follow these guidelines. Please only bring one care partner. Please be aware that your care partner may wait in the car in the parking lot or if they feel like they will be too hot to wait in the car, they may wait in the lobby on the 4th floor. All care partners are required to wear a mask the entire time (we do not have any that we can provide them), they need to practice social distancing, and we will do a Covid check for all patient's and care partners when you arrive. Also we will check their temperature and your temperature. If the care partner waits in their car they need to stay in the parking lot the entire time and we will call them on their cell phone when the patient is ready for discharge so they can bring the car to the front of the building. Also all patient's will need to wear a mask into building.

## 2020-03-04 ENCOUNTER — Ambulatory Visit (INDEPENDENT_AMBULATORY_CARE_PROVIDER_SITE_OTHER): Payer: Medicare Other | Admitting: Allergy

## 2020-03-04 ENCOUNTER — Other Ambulatory Visit: Payer: Self-pay

## 2020-03-04 ENCOUNTER — Ambulatory Visit (INDEPENDENT_AMBULATORY_CARE_PROVIDER_SITE_OTHER): Payer: Medicare Other

## 2020-03-04 ENCOUNTER — Ambulatory Visit: Payer: Self-pay

## 2020-03-04 ENCOUNTER — Encounter: Payer: Self-pay | Admitting: Allergy

## 2020-03-04 VITALS — BP 112/60 | HR 67 | Temp 97.4°F | Resp 16 | Ht 64.0 in | Wt 133.8 lb

## 2020-03-04 DIAGNOSIS — T7800XD Anaphylactic reaction due to unspecified food, subsequent encounter: Secondary | ICD-10-CM

## 2020-03-04 DIAGNOSIS — J3089 Other allergic rhinitis: Secondary | ICD-10-CM

## 2020-03-04 DIAGNOSIS — Z1159 Encounter for screening for other viral diseases: Secondary | ICD-10-CM

## 2020-03-04 DIAGNOSIS — J3489 Other specified disorders of nose and nasal sinuses: Secondary | ICD-10-CM | POA: Diagnosis not present

## 2020-03-04 DIAGNOSIS — K219 Gastro-esophageal reflux disease without esophagitis: Secondary | ICD-10-CM | POA: Diagnosis not present

## 2020-03-04 DIAGNOSIS — J4540 Moderate persistent asthma, uncomplicated: Secondary | ICD-10-CM

## 2020-03-04 DIAGNOSIS — J309 Allergic rhinitis, unspecified: Secondary | ICD-10-CM

## 2020-03-04 LAB — SARS CORONAVIRUS 2 (TAT 6-24 HRS): SARS Coronavirus 2: NEGATIVE

## 2020-03-04 NOTE — Assessment & Plan Note (Signed)
Improves with better diet control.   Continue taking omeprazole 40mg  daily as needed.

## 2020-03-04 NOTE — Assessment & Plan Note (Signed)
Past history - soy caused increased rhinitis symptoms. Interim history - Patient did not pick up Epipen due to cost. No reactions since last visit.   Continue to avoid peanuts, cow's milk, soybean.  For mild symptoms you can take over the counter antihistamines such as Benadryl and monitor symptoms closely. If symptoms worsen or if you have severe symptoms including breathing issues, throat closure, significant swelling, whole body hives, severe diarrhea and vomiting, lightheadedness then seek immediate medical care.

## 2020-03-04 NOTE — Assessment & Plan Note (Signed)
Past history - Felt better with [facility] but could not afford to purchase after samples ran out. Interim history - doing well with below regimen and has not had to use albuterol at all.   ACT score 24.   Today's spirometry was normal.  Continue Singulair 10mg  daily.   May use albuterol rescue inhaler 2 puffs or nebulizer every 4 to 6 hours as needed for shortness of breath, chest tightness, coughing, and wheezing. May use albuterol rescue inhaler 2 puffs 5 to 15 minutes prior to strenuous physical activities. Monitor frequency of use.

## 2020-03-04 NOTE — Patient Instructions (Addendum)
Perennial and seasonal allergic rhinoconjunctivitis  Continue environmental control measures.  Continue allergy injections every 2 weeks.   Continue xyzal 5mg  at night.  May use over the counter antihistamines such as Zyrtec (cetirizine), Claritin (loratadine), Allegra (fexofenadine) as needed in the morning.    Continue Flonase 1-2 sprays twice a day.   Continue Atrovent 2 sprays in each nostril 3 times a day as needed for drainage.   Nasal saline spray (i.e., Simply Saline) or nasal saline lavage (i.e., NeilMed) is recommended as needed and prior to medicated nasal sprays.  Continue montelukast 10mg  daily.  Moderate persistent asthma Daily controller medication(s):  Continue Singulair 10mg  daily.   May use albuterol rescue inhaler 2 puffs or nebulizer every 4 to 6 hours as needed for shortness of breath, chest tightness, coughing, and wheezing. May use albuterol rescue inhaler 2 puffs 5 to 15 minutes prior to strenuous physical activities. Monitor frequency of use.   Nasal septal perforation  Monitor symptoms.    Gastroesophageal reflux disease  Continue taking omeprazole 40mg  daily as needed.  Anaphylactic shock due to adverse food reaction  Continue to avoid peanuts, cow's milk, soybean.  For mild symptoms you can take over the counter antihistamines such as Benadryl and monitor symptoms closely. If symptoms worsen or if you have severe symptoms including breathing issues, throat closure, significant swelling, whole body hives, severe diarrhea and vomiting, lightheadedness then seek immediate medical care.  Follow up in 6 months or sooner if needed.

## 2020-03-04 NOTE — Assessment & Plan Note (Signed)
Saw ENT - no intervention for now.   Monitor symptoms.   

## 2020-03-04 NOTE — Assessment & Plan Note (Signed)
Past history - allergy immunotherapy for tree/grass/weed/feather/cockroach/cat/dog/mold/dust mites since 2016. Interim history - stable, tried to increase time between injections but get headaches.   Continue environmental control measures.  Continue allergy injections every 2 weeks.   Continue Xyzal 5mg  at night.  May use over the counter antihistamines such as Zyrtec (cetirizine), Claritin (loratadine), Allegra (fexofenadine) as needed in the morning.    Continue Flonase 1-2 sprays twice a day.   Continue Atrovent 2 sprays in each nostril 3 times a day as needed for drainage.   Nasal saline spray (i.e., Simply Saline) or nasal saline lavage (i.e., NeilMed) is recommended as needed and prior to medicated nasal sprays.  Continue montelukast 10mg  daily.

## 2020-03-04 NOTE — Progress Notes (Signed)
Follow Up Note  RE: Kimberly Lam MRN: [id number] DOB: May 25, 1946 Date of Office Visit: 03/04/2020  Referring provider: [name], NP Primary care provider: [name], NP  Chief Complaint: Asthma  History of Present Illness: I had the pleasure of seeing Kimberly Lam for a follow up visit at [facility] on 03/04/2020. She is a 74 y.o. female, who is being followed for allergic rhinoconjunctivitis, asthma, nasal septal perforation, GERD, food allergies. Her previous allergy office visit was on 10/21/2019 with Dr. Maudie Lam. Today is a regular follow up visit.  Perennial and seasonal allergic rhinoconjunctivitis Currently on allergy injections every 2 weeks and doing well.  Notices headaches at times when she comes for her injection more than 2 weeks apart.  Currently on Xyzal daily at night, Singulair 10 mg at night and takes another OTC antihistamines in the morning. Taking Flonase 2 spray BID, Atrovent 2 sprays BID and sometimes switches with azelastine nasal spray. No nosebleeds. Patient is content with above regimen.  Moderate persistent asthma, uncomplicated Gets shortness of breath with exertion at times. Otherwise denies any coughing, wheezing, chest tightness, nocturnal awakenings, ER/urgent care visits or prednisone use since the last visit. No inhaler use since the last visit.  Gastroesophageal reflux disease Takes omeprazole 40mg  as needed.  Symptoms are better controlled when she watches her diet.  Anaphylactic shock due to adverse food reaction Currently avoiding peanuts, cow's milk, soybean. No accidental ingestions and unable to afford the epinephrine injectable device.  Patient is getting a colonoscopy on Monday.  Assessment and Plan: Kimberly Lam is a 75 y.o. female with: Moderate persistent asthma, uncomplicated Past history - Felt better with [facility] but could not afford to purchase after samples ran out. Interim history - doing well  with below regimen and has not had to use albuterol at all.   ACT score 24.   Today's spirometry was normal.  Continue Singulair 10mg  daily.   May use albuterol rescue inhaler 2 puffs or nebulizer every 4 to 6 hours as needed for shortness of breath, chest tightness, coughing, and wheezing. May use albuterol rescue inhaler 2 puffs 5 to 15 minutes prior to strenuous physical activities. Monitor frequency of use.   Perennial and seasonal allergic rhinoconjunctivitis Past history - allergy immunotherapy for tree/grass/weed/feather/cockroach/cat/dog/mold/dust mites since 2016. Interim history - stable, tried to increase time between injections but get headaches.   Continue environmental control measures.  Continue allergy injections every 2 weeks.   Continue Xyzal 5mg  at night.  May use over the counter antihistamines such as Zyrtec (cetirizine), Claritin (loratadine), Allegra (fexofenadine) as needed in the morning.    Continue Flonase 1-2 sprays twice a day.   Continue Atrovent 2 sprays in each nostril 3 times a day as needed for drainage.   Nasal saline spray (i.e., Simply Saline) or nasal saline lavage (i.e., NeilMed) is recommended as needed and prior to medicated nasal sprays.  Continue montelukast 10mg  daily.  Gastroesophageal reflux disease Improves with better diet control.   Continue taking omeprazole 40mg  daily as needed.   Nasal septal perforation Saw ENT - no intervention for now.   Monitor symptoms.    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 Interim history - Patient did not pick up Epipen due to cost. No reactions since last visit.   Continue to avoid peanuts, cow's milk, soybean.  For mild symptoms you can take over the counter antihistamines such as Benadryl and monitor  symptoms closely. If symptoms worsen or if you have severe symptoms including breathing issues, throat closure, significant  swelling, whole body hives, severe diarrhea and vomiting, lightheadedness then seek immediate medical care.  Return in about 6 months (around 09/04/2020).  Diagnostics: Spirometry:  Tracings reviewed. Her effort: It was hard to get consistent efforts and there is a question as to whether this reflects a maximal maneuver. FVC: 2.35L FEV1: 1.79L, 99% predicted FEV1/FVC ratio: 76% Interpretation: Spirometry consistent with normal pattern.  Please see scanned spirometry results for details.  Medication List:  Current Outpatient Medications  Medication Sig Dispense Refill  . Alcohol Swabs (B-D SINGLE USE SWABS REGULAR) PADS USE TO TEST BLOOD GLUCOSE FOUR TIMES DAILY 200 each 3  . alendronate (FOSAMAX) 70 MG tablet TAKE 1 TABLET BY MOUTH 1  TIME A WEEK. TAKE WITH A  FULL GLASS OF WATER ON AN  EMPTY STOMACH. 12 tablet 3  . Ascorbic Acid (VITAMIN C) 1000 MG tablet Take 1,000 mg by mouth as needed.    . Azelastine HCl 0.15 % SOLN Use 1-2 sprays per nostril twice a day as needed for sinus drainage. 30 mL 5  . Barberry-Oreg Grape-Goldenseal (BERBERINE COMPLEX PO) Take by mouth daily.    . Calcium Carbonate-Vitamin D (CALCIUM 600+D PO) Take by mouth.    . cholecalciferol (VITAMIN D) 1000 units tablet Take 1,000 Units by mouth daily.    Marland Kitchen CINNAMON PO Take 1 tablet by mouth as needed.    . Cranberry 450 MG CAPS Take 1 capsule by mouth as needed.    . Cyanocobalamin (VITAMIN B-12 CR) 1500 MCG TBCR Take 1 tablet by mouth as needed.    . Echinacea-Goldenseal (ECHINACEA COMB/GOLDEN SEAL PO) Take 1 tablet by mouth as needed. Reported on 12/16/2015    . EPINEPHrine (EPIPEN 2-PAK) 0.3 mg/0.3 mL IJ SOAJ injection Inject 0.3 mg into the muscle as needed for anaphylaxis.    . fluticasone (FLONASE) 50 MCG/ACT nasal spray Place 1 spray into both nostrils 2 (two) times daily. 48 g 1  . gabapentin (NEURONTIN) 300 MG capsule TAKE 1 CAPSULE BY MOUTH 4  TIMES DAILY 360 capsule 3  . glucose blood (ACCU-CHEK GUIDE) test  strip USE TO TEST BLOOD GLUCOSE FOUR TIMES DAILY 400 each 3  . Green Tea, Camillia sinensis, (GREEN TEA PO) Take by mouth as needed. Reported on 12/16/2015    . ipratropium (ATROVENT) 0.06 % nasal spray USE 2 SPRAYS IN BOTH  NOSTRILS 3 TIMES DAILY AS  NEEDED FOR RHINITIS 90 mL 0  . Lactobacillus (ACIDOPHILUS PROBIOTIC PO) Take 1 tablet by mouth daily.    Marland Kitchen levocetirizine (XYZAL) 5 MG tablet TAKE 1 TABLET BY MOUTH  EVERY EVENING 90 tablet 1  . MELATONIN ER PO Take by mouth.    . metFORMIN (GLUCOPHAGE-XR) 500 MG 24 hr tablet TAKE 2 TABLETS BY MOUTH  TWICE DAILY 360 tablet 3  . montelukast (SINGULAIR) 10 MG tablet Take 1 tablet (10 mg total) by mouth at bedtime. 90 tablet 1  . Omega-3 Fatty Acids (OMEGA 3 PO) Take 1 tablet by mouth as needed.    Marland Kitchen POTASSIUM PO Take by mouth.    . ramipril (ALTACE) 2.5 MG capsule TAKE 1 CAPSULE BY MOUTH  DAILY 90 capsule 3  . simvastatin (ZOCOR) 20 MG tablet TAKE 1 TABLET BY MOUTH  DAILY 90 tablet 3  . traZODone (DESYREL) 50 MG tablet TAKE 1/2 TO 1 TABLET BY  MOUTH AT BEDTIME AS NEEDED. FOR SLEEP 90 tablet 3  .  VENTOLIN HFA 108 (90 Base) MCG/ACT inhaler Inhale 2 puffs into the lungs every 6 (six) hours as needed for wheezing or shortness of breath.    Marland Kitchen omeprazole (PRILOSEC) 40 MG capsule Take 40 mg by mouth daily.    Vladimir Faster Glycol-Propyl Glycol (SYSTANE FREE OP) Apply 1 drop to eye daily. Uses thera work for dr eyes per pt    . Spacer/Aero-Holding Chambers (AEROCHAMBER PLUS) inhaler Use as instructed with MDI 1 each 2  . Stinging Nettle 2 % POWD Take 1 tablet by mouth as needed. Reported on 12/16/2015    . TURMERIC PO Take 800 mg by mouth as needed. Reported on 12/16/2015     No current facility-administered medications for this visit.   Allergies: Allergies  Allergen Reactions  . Peanut-Containing Drug Products Anaphylaxis  . Eggs Or Egg-Derived Products Nausea Only    Nausea only- can eat cakes with eggs   . Milk-Related Compounds Nausea And Vomiting      Milk and cheese  . Actos [Pioglitazone] Nausea Only  . Soy Allergy Nausea Only    Pt states had allergy testing and was told allergic to soy-causes nausea but no other reactions  . Vicodin [Hydrocodone-Acetaminophen] Nausea Only   I reviewed her past medical history, social history, family history, and environmental history and no significant changes have been reported from her previous visit.  Review of Systems  Constitutional: Negative for appetite change, chills, fever and unexpected weight change.  HENT: Positive for congestion and postnasal drip. Negative for rhinorrhea.   Eyes: Negative for itching.  Respiratory: Negative for cough, chest tightness, shortness of breath and wheezing.   Gastrointestinal: Negative for abdominal pain.  Skin: Negative for rash.  Allergic/Immunologic: Positive for environmental allergies and food allergies.  Neurological: Negative for headaches.   Objective: BP 112/60 (BP Location: Left Arm, Patient Position: Sitting, Cuff Size: Normal)   Pulse 67   Temp (!) 97.4 F (36.3 C) (Temporal)   Resp 16   Ht 5\' 4"  (1.626 m)   Wt 133 lb 12.8 oz (60.7 kg)   SpO2 98%   BMI 22.97 kg/m  Body mass index is 22.97 kg/m. Physical Exam  Constitutional: She is oriented to person, place, and time. She appears well-developed and well-nourished.  HENT:  Head: Normocephalic and atraumatic.  Right Ear: External ear normal.  Left Ear: External ear normal.  Mouth/Throat: Oropharynx is clear and moist.  Septal perforation  Eyes: Conjunctivae and EOM are normal.  Cardiovascular: Normal rate, regular rhythm and normal heart sounds. Exam reveals no gallop and no friction rub.  No murmur heard. Pulmonary/Chest: Effort normal and breath sounds normal. She has no wheezes. She has no rales.  Musculoskeletal:     Cervical back: Neck supple.  Neurological: She is alert and oriented to person, place, and time.  Skin: Skin is warm. No rash noted.  Psychiatric: She has a  normal mood and affect. Her behavior is normal.  Nursing note and vitals reviewed.  Previous notes and tests were reviewed. The plan was reviewed with the patient/family, and all questions/concerned were addressed.  It was my pleasure to see Kimberly Lam today and participate in her care. Please feel free to contact me with any questions or concerns.  Sincerely,  Rexene Alberts, DO Allergy & Immunology  [facility] office: [phone] [facility] office: [place] office: [phone]

## 2020-03-05 ENCOUNTER — Encounter: Payer: Self-pay | Admitting: Gastroenterology

## 2020-03-06 ENCOUNTER — Encounter: Payer: Self-pay | Admitting: Adult Health

## 2020-03-09 ENCOUNTER — Encounter: Payer: Self-pay | Admitting: Internal Medicine

## 2020-03-09 ENCOUNTER — Other Ambulatory Visit: Payer: Self-pay

## 2020-03-09 ENCOUNTER — Ambulatory Visit (AMBULATORY_SURGERY_CENTER): Payer: Medicare Other | Admitting: Internal Medicine

## 2020-03-09 VITALS — BP 118/57 | HR 92 | Temp 97.7°F | Resp 15 | Ht 64.0 in | Wt 130.0 lb

## 2020-03-09 DIAGNOSIS — K6389 Other specified diseases of intestine: Secondary | ICD-10-CM | POA: Diagnosis not present

## 2020-03-09 DIAGNOSIS — Z1211 Encounter for screening for malignant neoplasm of colon: Secondary | ICD-10-CM

## 2020-03-09 DIAGNOSIS — K635 Polyp of colon: Secondary | ICD-10-CM | POA: Diagnosis not present

## 2020-03-09 DIAGNOSIS — D124 Benign neoplasm of descending colon: Secondary | ICD-10-CM

## 2020-03-09 MED ORDER — FLEET ENEMA 7-19 GM/118ML RE ENEM
1.0000 | ENEMA | Freq: Once | RECTAL | Status: AC
  Administered 2020-03-09: 1 via RECTAL

## 2020-03-09 MED ORDER — SODIUM CHLORIDE 0.9 % IV SOLN: 500.0000 mL | Freq: Once | INTRAVENOUS | Status: DC

## 2020-03-09 NOTE — Op Note (Signed)
[facility] Patient Name: Kimberly Lam Procedure Date: 03/09/2020 1:46 PM MRN: [id number] Endoscopist: Gatha Mayer , MD Age: 74 Referring MD:  Date of Birth: [birth year] Gender: Female Account #: [id number] Procedure:                Colonoscopy Indications:              Screening for colorectal malignant neoplasm Medicines:                Propofol per Anesthesia, Monitored Anesthesia Care Procedure:                Pre-Anesthesia Assessment:                           - Prior to the procedure, a History and Physical                            was performed, and patient medications and                            allergies were reviewed. The patient's tolerance of                            previous anesthesia was also reviewed. The risks                            and benefits of the procedure and the sedation                            options and risks were discussed with the patient.                            All questions were answered, and informed consent                            was obtained. Prior Anticoagulants: The patient has                            taken no previous anticoagulant or antiplatelet                            agents. ASA Grade Assessment: II - A patient with                            mild systemic disease. After reviewing the risks                            and benefits, the patient was deemed in                            satisfactory condition to undergo the procedure.                           After obtaining informed consent, the colonoscope  was passed under direct vision. Throughout the                            procedure, the patient's blood pressure, pulse, and                            oxygen saturations were monitored continuously. The                            Colonoscope was introduced through the anus and                            advanced to the the cecum, identified by   appendiceal orifice and ileocecal valve. The                            colonoscopy was performed without difficulty. The                            patient tolerated the procedure well. The quality                            of the bowel preparation was good. The ileocecal                            valve, appendiceal orifice, and rectum were                            photographed. The bowel preparation used was                            Miralax via split dose instruction. Scope In: 2:00:10 PM Scope Out: 2:20:15 PM Scope Withdrawal Time: 0 hours 14 minutes 21 seconds  Total Procedure Duration: 0 hours 20 minutes 5 seconds  Findings:                 The perianal and digital rectal examinations were                            normal.                           A diminutive polyp was found in the descending                            colon. The polyp was sessile. The polyp was removed                            with a cold snare. Resection and retrieval were                            complete. Verification of patient identification                            for the specimen was done. Estimated  blood loss was                            minimal.                           The exam was otherwise without abnormality on                            direct and retroflexion views. Complications:            No immediate complications. Estimated Blood Loss:     Estimated blood loss was minimal. Impression:               - One diminutive polyp in the descending colon,                            removed with a cold snare. Resected and retrieved.                           - The examination was otherwise normal on direct                            and retroflexion views. Recommendation:           - Patient has a contact number available for                            emergencies. The signs and symptoms of potential                            delayed complications were discussed with the                             patient. Return to normal activities tomorrow.                            Written discharge instructions were provided to the                            patient.                           - Resume previous diet.                           - Continue present medications.                           - No recommendation at this time regarding repeat                            colonoscopy due to age. Gatha Mayer, MD 03/09/2020 2:25:50 PM This report has been signed electronically.

## 2020-03-09 NOTE — Progress Notes (Signed)
Called to room to assist during endoscopic procedure.  Patient ID and intended procedure confirmed with present staff. Received instructions for my participation in the procedure from the performing physician.  

## 2020-03-09 NOTE — Patient Instructions (Addendum)
I found and removed one tiny polyp. It looks benign.  I doubt I will recommend another routine colonoscopy but will let you know once I see the pathology analysis of the polyp.  I appreciate the opportunity to care for you. Gatha Mayer, MD, [facility]  Resume previous diet Continue current medications Await pathology results  YOU HAD AN ENDOSCOPIC PROCEDURE TODAY AT [facility]:   Refer to the procedure report that was given to you for any specific questions about what was found during the examination.  If the procedure report does not answer your questions, please call your gastroenterologist to clarify.  If you requested that your care partner not be given the details of your procedure findings, then the procedure report has been included in a sealed envelope for you to review at your convenience later.  YOU SHOULD EXPECT: Some feelings of bloating in the abdomen. Passage of more gas than usual.  Walking can help get rid of the air that was put into your GI tract during the procedure and reduce the bloating. If you had a lower endoscopy (such as a colonoscopy or flexible sigmoidoscopy) you may notice spotting of blood in your stool or on the toilet paper. If you underwent a bowel prep for your procedure, you may not have a normal bowel movement for a few days.  Please Note:  You might notice some irritation and congestion in your nose or some drainage.  This is from the oxygen used during your procedure.  There is no need for concern and it should clear up in a day or so.  SYMPTOMS TO REPORT IMMEDIATELY:   Following lower endoscopy (colonoscopy or flexible sigmoidoscopy):  Excessive amounts of blood in the stool  Significant tenderness or worsening of abdominal pains  Swelling of the abdomen that is new, acute  Fever of 100F or higher   For urgent or emergent issues, a gastroenterologist can be reached at any hour by calling [phone]. Do not use MyChart messaging  for urgent concerns.    DIET:  We do recommend a small meal at first, but then you may proceed to your regular diet.  Drink plenty of fluids but you should avoid alcoholic beverages for 24 hours.  ACTIVITY:  You should plan to take it easy for the rest of today and you should NOT DRIVE or use heavy machinery until tomorrow (because of the sedation medicines used during the test).    FOLLOW UP: Our staff will call the number listed on your records 48-72 hours following your procedure to check on you and address any questions or concerns that you may have regarding the information given to you following your procedure. If we do not reach you, we will leave a message.  We will attempt to reach you two times.  During this call, we will ask if you have developed any symptoms of COVID 19. If you develop any symptoms (ie: fever, flu-like symptoms, shortness of breath, cough etc.) before then, please call [phone].  If you test positive for Covid 19 in the 2 weeks post procedure, please call and report this information to [place].    If any biopsies were taken you will be contacted by phone or by letter within the next 1-3 weeks.  Please call us at [phone] if you have not heard about the biopsies in 3 weeks.    SIGNATURES/CONFIDENTIALITY: You and/or your care partner have signed paperwork which will be entered into your electronic medical  record.  These signatures attest to the fact that that the information above on your After Visit Summary has been reviewed and is understood.  Full responsibility of the confidentiality of this discharge information lies with you and/or your care-partner. 

## 2020-03-09 NOTE — Progress Notes (Signed)
Temp by LC  Vitals by KA   Pt's states no medical or surgical changes since previsit or office visit.

## 2020-03-09 NOTE — Progress Notes (Signed)
A/ox3, pleased with MAC, report to RN 

## 2020-03-11 ENCOUNTER — Telehealth: Payer: Self-pay

## 2020-03-11 NOTE — Telephone Encounter (Signed)
  Follow up Call-  Call back number 03/09/2020  Post procedure Call Back phone  # [phone]  Permission to leave phone message Yes  Some recent data might be hidden     Patient questions:  Do you have a fever, pain , or abdominal swelling? No. Pain Score  0 *  Have you tolerated food without any problems? Yes.    Have you been able to return to your normal activities? Yes.    Do you have any questions about your discharge instructions: Diet   No. Medications  No. Follow up visit  No.  Do you have questions or concerns about your Care? No.  Actions: * If pain score is 4 or above: No action needed, pain <4.  1. Have you developed a fever since your procedure? no  2.   Have you had an respiratory symptoms (SOB or cough) since your procedure? no  3.   Have you tested positive for COVID 19 since your procedure no  4.   Have you had any family members/close contacts diagnosed with the COVID 19 since your procedure?  no   If yes to any of these questions please route to Joylene John, RN and Erenest Rasher, RN

## 2020-03-16 MED ORDER — ACCU-CHEK GUIDE ME W/DEVICE KIT: 1.0000 | PACK | Freq: Once | 0 refills | Status: AC

## 2020-03-16 MED ORDER — ACCU-CHEK GUIDE VI STRP: ORAL_STRIP | 3 refills | Status: DC

## 2020-03-18 ENCOUNTER — Encounter: Payer: Self-pay | Admitting: Internal Medicine

## 2020-03-18 ENCOUNTER — Encounter: Payer: Self-pay | Admitting: Adult Health

## 2020-03-19 ENCOUNTER — Encounter: Payer: Self-pay | Admitting: Adult Health

## 2020-03-19 NOTE — Telephone Encounter (Signed)
Pt spoke to her insurance and was told that Tommi Rumps needs to void the [place] and they are faxing over papers for him to sign for the new meter and lancets plus strips.

## 2020-03-24 ENCOUNTER — Ambulatory Visit (INDEPENDENT_AMBULATORY_CARE_PROVIDER_SITE_OTHER): Payer: Medicare Other

## 2020-03-24 DIAGNOSIS — J309 Allergic rhinitis, unspecified: Secondary | ICD-10-CM

## 2020-03-25 MED ORDER — ONETOUCH VERIO VI STRP: ORAL_STRIP | 3 refills | Status: DC

## 2020-03-25 MED ORDER — ONETOUCH VERIO FLEX SYSTEM W/DEVICE KIT: 1.0000 | PACK | Freq: Once | 0 refills | Status: DC

## 2020-03-25 MED ORDER — ONETOUCH DELICA PLUS LANCET30G MISC: 1.0000 | Freq: Four times a day (QID) | 3 refills | Status: DC

## 2020-03-31 ENCOUNTER — Encounter: Payer: Self-pay | Admitting: Adult Health

## 2020-03-31 ENCOUNTER — Ambulatory Visit (INDEPENDENT_AMBULATORY_CARE_PROVIDER_SITE_OTHER): Payer: Medicare Other

## 2020-03-31 DIAGNOSIS — J309 Allergic rhinitis, unspecified: Secondary | ICD-10-CM | POA: Diagnosis not present

## 2020-04-06 ENCOUNTER — Encounter: Payer: Self-pay | Admitting: Neurology

## 2020-04-06 ENCOUNTER — Ambulatory Visit: Payer: Medicare Other | Admitting: Neurology

## 2020-04-06 ENCOUNTER — Ambulatory Visit
Admission: RE | Admit: 2020-04-06 | Discharge: 2020-04-06 | Disposition: A | Discharge status: Home / Self Care | Payer: Medicare Other | Source: Ambulatory Visit | Attending: Neurology | Admitting: Neurology

## 2020-04-06 ENCOUNTER — Other Ambulatory Visit: Payer: Self-pay

## 2020-04-06 VITALS — BP 114/67 | HR 76 | Temp 97.7°F | Ht 64.0 in | Wt 135.0 lb

## 2020-04-06 DIAGNOSIS — M25551 Pain in right hip: Secondary | ICD-10-CM | POA: Insufficient documentation

## 2020-04-06 DIAGNOSIS — M5416 Radiculopathy, lumbar region: Secondary | ICD-10-CM | POA: Diagnosis not present

## 2020-04-06 NOTE — Progress Notes (Signed)
PATIENT: Kimberly Lam DOB: Apr 07, 1946  REASON FOR VISIT: follow up HISTORY FROM: patient  HISTORY OF PRESENT ILLNESS: Today 04/06/20  HISTORY Kimberly Lam a 74 years old female, seen in refer by her primary care nurse practitioner [name] for evaluation of leg cramping, tremors, initial evaluation was on January 23, 2018.   She has past medical history of hypertension, diabetes, hyperlipidemia  I saw her initially in 2016 for evaluation of low back pain, radiating pain to bilateral hip, bilateral leg muscle cramping, and weakness.  She has history of low back pain since 2006, midline low back, occasionally go to posterior thigh, go down below her knee, worsening with prolonged standing, or walking,  Previous her low back pain has responded very well to epidural injection, but the benefit gradually fades away, over the years, she complains of worsening low back pain, radiating pain to bilateral lower extremity again, in addition, she complains of bilateral feet paresthesia,   She had a history of diabetes, she denies gait difficulty, no bowel and bladder incontinence, she denies bilateral upper extremity paresthesia or weakness.   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   Note from Dr. Franciso Bend, [facility], [place] in 2017, she was diagnosed with degenerative spondylolisthesis L4-L5, L5-S1, neurogenic claudication, low back pain, received epidural injection in Feb 2015, benefit lasted about one year  MRI of lumbar spine December 2016, There is a transitional L6/S1 vertebral body. 8 mm of anterolisthesis of L5 upon L6/S1 of a degenerative nature due to severe facet hypertrophy. At this level, there is mild spinal stenosis and moderately severe right foraminal narrowing. There could be  compression of the right L5 nerve root. Degenerative changes at L6/S1-S2 lead to severe right foraminal narrowing that could lead to compression of the exiting right S1 nerve root  She continue complains of chronic low back pain, today she also complains of new onset right hand shaking, that was intermittent, getting worse when she holding utensils, writing, mild involvement on the left side, she continue complains of frequent lower extremity cramping especially on the right side, difficulty to walk in the morning. There was no family history of tremor  UPDATE Nov 5th 2019: She was given prescription of gabapentin for her bilateral feet paresthesia, 300 mg 4 times a day does help her lower extremity cramps, but complains of daytime sleepiness, but she often woke up in the middle of the night complains of left muscle cramping, difficulty going to sleep,  We again personally reviewed MRI of lumbar spine in December 2016, transitional L6-S1 vertebral body, 8 mm of anterolisthesis of L5 upon S1 with severe facet hypertrophy, mild spinal stenosis, moderate severe right foraminal narrowing, potential compression of right L5 nerve roots,  UPDATE July 10 2019: She is overall doing much better, taking gabapentin 300 mg 2 tablets before bedtime, open skip daytime dose, epidural injection few months ago has helped her as well, she exercise regularly, denies gait abnormality, denies bowel and bladder incontinence  Update November 21, 2019 SS: She overall continues to do very well.  Apparently, she rotates her gabapentin dosing, says she takes gabapentin 300 mg, 2 capsules at least twice a day, at most she may take 4 times a day.  She adjust the regimen depending on her leg cramps.  She also has some right hip pain.  She knows  that her low back gets to bothering her, she will do stretching, and she is able to control it well.  She is going to relocate to [place], but may still come here if she can't find  another neurologist.    Update April 06, 2020 SS: Here today to discuss new problem of right hip pain.  Indicates since Friday, right hip/pelvic pain, with weightbearing and turning.  She denies fall or injury.  She denies pain down the legs, no numbness or weakness.  She denies any urinary symptoms.  She remains on gabapentin 600 mg twice a day for cramping in her legs.  Indicates this pain is not typical of her chronic low back pain, which is usually in the center of her back.  She presents today unaccompanied. She has not tried tylenol or ibuprofen.  REVIEW OF SYSTEMS: Out of a complete 14 system review of symptoms, the patient complains only of the following symptoms, and all other reviewed systems are negative.  Hip pain  ALLERGIES: Allergies  Allergen Reactions  . Peanut-Containing Drug Products Anaphylaxis  . Eggs Or Egg-Derived Products Nausea Only    Nausea only- can eat cakes with eggs   . Milk-Related Compounds Nausea And Vomiting    Milk and cheese  . Actos [Pioglitazone] Nausea Only  . Soy Allergy Nausea Only    Pt states had allergy testing and was told allergic to soy-causes nausea but no other reactions  . Vicodin [Hydrocodone-Acetaminophen] Nausea Only    HOME MEDICATIONS: Outpatient Medications Prior to Visit  Medication Sig Dispense Refill  . Alcohol Swabs (B-D SINGLE USE SWABS REGULAR) PADS USE TO TEST BLOOD GLUCOSE FOUR TIMES DAILY 200 each 3  . alendronate (FOSAMAX) 70 MG tablet TAKE 1 TABLET BY MOUTH 1  TIME A WEEK. TAKE WITH A  FULL GLASS OF WATER ON AN  EMPTY STOMACH. 12 tablet 3  . Ascorbic Acid (VITAMIN C) 1000 MG tablet Take 1,000 mg by mouth as needed.    . Azelastine HCl 0.15 % SOLN Use 1-2 sprays per nostril twice a day as needed for sinus drainage. 30 mL 5  . Barberry-Oreg Grape-Goldenseal (BERBERINE COMPLEX PO) Take by mouth daily.    . Calcium Carbonate-Vitamin D (CALCIUM 600+D PO) Take by mouth.    . cholecalciferol (VITAMIN D) 1000 units tablet Take  1,000 Units by mouth daily.    Marland Kitchen CINNAMON PO Take 1 tablet by mouth as needed.    . Cranberry 450 MG CAPS Take 1 capsule by mouth as needed.    . Cyanocobalamin (VITAMIN B-12 CR) 1500 MCG TBCR Take 1 tablet by mouth as needed.    . Echinacea-Goldenseal (ECHINACEA COMB/GOLDEN SEAL PO) Take 1 tablet by mouth as needed. Reported on 12/16/2015    . EPINEPHrine (EPIPEN 2-PAK) 0.3 mg/0.3 mL IJ SOAJ injection Inject 0.3 mg into the muscle as needed for anaphylaxis.    . fluticasone (FLONASE) 50 MCG/ACT nasal spray Place 1 spray into both nostrils 2 (two) times daily. 48 g 1  . gabapentin (NEURONTIN) 300 MG capsule TAKE 1 CAPSULE BY MOUTH 4  TIMES DAILY 360 capsule 3  . Green Tea, Camillia sinensis, (GREEN TEA PO) Take by mouth as needed. Reported on 12/16/2015    . ipratropium (ATROVENT) 0.06 % nasal spray USE 2 SPRAYS IN BOTH  NOSTRILS 3 TIMES DAILY AS  NEEDED FOR RHINITIS 90 mL 0  . Lactobacillus (ACIDOPHILUS PROBIOTIC PO) Take 1 tablet by mouth daily.    . Lancets (ONETOUCH DELICA PLUS Q000111Q)  MISC 1 each by Does not apply route in the morning, at noon, in the evening, and at bedtime. 400 each 3  . levocetirizine (XYZAL) 5 MG tablet TAKE 1 TABLET BY MOUTH  EVERY EVENING 90 tablet 1  . MELATONIN ER PO Take by mouth.    . metFORMIN (GLUCOPHAGE-XR) 500 MG 24 hr tablet TAKE 2 TABLETS BY MOUTH  TWICE DAILY 360 tablet 3  . montelukast (SINGULAIR) 10 MG tablet Take 1 tablet (10 mg total) by mouth at bedtime. 90 tablet 1  . Omega-3 Fatty Acids (OMEGA 3 PO) Take 1 tablet by mouth as needed.    Marland Kitchen omeprazole (PRILOSEC) 40 MG capsule Take 40 mg by mouth daily.    Glory Rosebush VERIO test strip USE TO TEST BLOOD GLUCOSE FOUR TIMES DAILY 400 each 3  . Polyethyl Glycol-Propyl Glycol (SYSTANE FREE OP) Apply 1 drop to eye daily. Uses thera work for dr eyes per pt    . POTASSIUM PO Take by mouth.    . ramipril (ALTACE) 2.5 MG capsule TAKE 1 CAPSULE BY MOUTH  DAILY 90 capsule 3  . simvastatin (ZOCOR) 20 MG tablet  TAKE 1 TABLET BY MOUTH  DAILY 90 tablet 3  . Spacer/Aero-Holding Chambers (AEROCHAMBER PLUS) inhaler Use as instructed with MDI 1 each 2  . Stinging Nettle 2 % POWD Take 1 tablet by mouth as needed. Reported on 12/16/2015    . traZODone (DESYREL) 50 MG tablet TAKE 1/2 TO 1 TABLET BY  MOUTH AT BEDTIME AS NEEDED. FOR SLEEP 90 tablet 3  . TURMERIC PO Take 800 mg by mouth as needed. Reported on 12/16/2015    . VENTOLIN HFA 108 (90 Base) MCG/ACT inhaler Inhale 2 puffs into the lungs every 6 (six) hours as needed for wheezing or shortness of breath.     No facility-administered medications prior to visit.    PAST MEDICAL HISTORY: Past Medical History:  Diagnosis Date  . Acid reflux   . Allergic rhinitis   . Allergy   . Anemia   . Arthritis    per MD- pt states no s/s of this   . Asthma   . Bilateral sciatica   . Cataract    removed both eyes   . Chronic bilateral low back pain   . Colon polyps   . Diverticulosis   . DM type 2 (diabetes mellitus, type 2) ([place])   . Environmental allergies    flowers, pollen, trees  . GERD (gastroesophageal reflux disease)   . Hyperlipidemia   . Low back pain   . Migraine    past hx- control as allergies are controlled   . Osteoporosis   . PONV (postoperative nausea and vomiting)     PAST SURGICAL HISTORY: Past Surgical History:  Procedure Laterality Date  . CATARACT EXTRACTION, BILATERAL Bilateral 2012  . CESAREAN SECTION     x 2  . COLONOSCOPY     > 10 yrs ago- unsure if removed polyps as was told sev.different things after this colon   . DENTAL SURGERY     molars removed   . DILATION AND CURETTAGE OF UTERUS    . NASAL SINUS SURGERY    . TUBAL LIGATION      FAMILY HISTORY: Family History  Problem Relation Age of Onset  . Asthma Father   . Emphysema Father   . Alcohol abuse Father   . Diabetes Mellitus II Brother        x 2  . Hyperlipidemia Brother   .  Hypertension Brother   . Pancreatic cancer Brother   . Prostate cancer  Brother   . Diabetes Brother   . Heart disease Maternal Grandmother        hardening of the arteries  . Heart murmur Daughter   . Glaucoma Brother   . Breast cancer Neg Hx   . Colon cancer Neg Hx   . Colon polyps Neg Hx   . Esophageal cancer Neg Hx   . Rectal cancer Neg Hx   . Stomach cancer Neg Hx     SOCIAL HISTORY: Social History   Socioeconomic History  . Marital status: Married    Spouse name: Not on file  . Number of children: 2  . Years of education: 3  . Highest education level: Not on file  Occupational History  . Occupation: Retired  Tobacco Use  . Smoking status: Never Smoker  . Smokeless tobacco: Never Used  Substance and Sexual Activity  . Alcohol use: No    Alcohol/week: 0.0 standard drinks  . Drug use: No  . Sexual activity: Not on file  Other Topics Concern  . Not on file  Social History Narrative      Married 1 son one daughter, relocated to [place] after living in [place] for many years. She is a Writer of [employer]   No caffeine.          Lives at home with her husband and brother.   Right-handed.   No caffeine use.   Social Determinants of Health   Financial Resource Strain:   . Difficulty of Paying Living Expenses:   Food Insecurity: No Food Insecurity  . Worried About Charity fundraiser in the Last Year: Never true  . Ran Out of Food in the Last Year: Never true  Transportation Needs: No Transportation Needs  . Lack of Transportation (Medical): No  . Lack of Transportation (Non-Medical): No  Physical Activity:   . Days of Exercise per Week:   . Minutes of Exercise per Session:   Stress:   . Feeling of Stress :   Social Connections:   . Frequency of Communication with Friends and Family:   . Frequency of Social Gatherings with Friends and Family:   . Attends Religious Services:   . Active Member of Clubs or Organizations:   . Attends Archivist Meetings:   Marland Kitchen Marital Status:   Intimate  Partner Violence:   . Fear of Current or Ex-Partner:   . Emotionally Abused:   Marland Kitchen Physically Abused:   . Sexually Abused:     PHYSICAL EXAM  Vitals:   04/06/20 1325  BP: 114/67  Pulse: 76  Temp: 97.7 F (36.5 C)  Weight: 135 lb (61.2 kg)  Height: 5\' 4"  (1.626 m)   Body mass index is 23.17 kg/m.  Generalized: Well developed, in no acute distress   Neurological examination  Mentation: Alert oriented to time, place, history taking. Follows all commands speech and language fluent Cranial nerve II-XII: Pupils were equal round reactive to light. Extraocular movements were full, visual field were full on confrontational test. Facial sensation and strength were normal. Head turning and shoulder shrug  were normal and symmetric. Motor: The motor testing reveals 5 over 5 strength of all 4 extremities. Good symmetric motor tone is noted throughout. No weakness with right hip flexion was noted Sensory: Sensory testing is intact to soft touch on all 4 extremities. No evidence of extinction is noted.  Coordination: Cerebellar  testing reveals good finger-nose-finger and heel-to-shin bilaterally.  Gait and station: Gait is antalgic on the right with weightbearing, pace is slowed, no assistive device, tandem gait was slightly unsteady. Reflexes: Deep tendon reflexes are symmetric and normal bilaterally.   DIAGNOSTIC DATA (LABS, IMAGING, TESTING) - I reviewed patient records, labs, notes, testing and imaging myself where available.  Lab Results  Component Value Date   WBC 2.9 (L) 02/13/2020   HGB 13.5 02/13/2020   HCT 39.8 02/13/2020   MCV 92.0 02/13/2020   PLT 213.0 02/13/2020      Component Value Date/Time   NA 137 02/13/2020 0901   NA 140 04/12/2017 0000   K 4.7 02/13/2020 0901   CL 101 02/13/2020 0901   CO2 30 02/13/2020 0901   GLUCOSE 141 (H) 02/13/2020 0901   BUN 12 02/13/2020 0901   BUN 11 04/12/2017 0000   CREATININE 0.87 02/13/2020 0901   CALCIUM 10.0 02/13/2020 0901    PROT 7.0 02/13/2020 0901   ALBUMIN 4.2 02/13/2020 0901   AST 15 02/13/2020 0901   ALT 15 02/13/2020 0901   ALKPHOS 52 02/13/2020 0901   BILITOT 0.4 02/13/2020 0901   Lab Results  Component Value Date   CHOL 159 02/13/2020   HDL 58.50 02/13/2020   LDLCALC 78 02/13/2020   TRIG 111.0 02/13/2020   CHOLHDL 3 02/13/2020   Lab Results  Component Value Date   HGBA1C 7.4 (H) 02/13/2020   No results found for: VITAMINB12 Lab Results  Component Value Date   TSH 2.59 02/13/2020    ASSESSMENT AND PLAN 74 y.o. year old female  has a past medical history of Acid reflux, Allergic rhinitis, Allergy, Anemia, Arthritis, Asthma, Bilateral sciatica, Cataract, Chronic bilateral low back pain, Colon polyps, Diverticulosis, DM type 2 (diabetes mellitus, type 2) ([place]), Environmental allergies, GERD (gastroesophageal reflux disease), Hyperlipidemia, Low back pain, Migraine, Osteoporosis, and PONV (postoperative nausea and vomiting). here with:  1.  Chronic low back pain, radiating pain to bilateral lower extremities 2.  Acute right hip/pelvic pain -Indicates pain is different from typical chronic low back pain, on exam, is nontender to touch, gait is antalgic on the right with weightbearing -Will send for x-ray right hip/pelvis, depending in findings, may offer meloxicam, possibly increase gabapentin -No urinary symptoms at this time  -Encouraged alternating ice/heat, may need to see PCP if etiology is not back related  -Keep follow-up appointment in August, indicates undecided if relocating to [place] or not  I spent 30 minutes of face-to-face and non-face-to-face time with patient.  This included previsit chart review, lab review, study review, order entry, electronic health record documentation, patient education.  Butler Denmark, AGNP-C, DNP 04/06/2020, 2:02 PM [facility] [address] [place], [place] [address] [phone]

## 2020-04-06 NOTE — Patient Instructions (Addendum)
I will order xray of your hip/pelvis Will update once the xray returns, depending on findings may need to see PCP Try ice/heat to the area

## 2020-04-07 ENCOUNTER — Telehealth: Payer: Self-pay | Admitting: Neurology

## 2020-04-07 ENCOUNTER — Ambulatory Visit (INDEPENDENT_AMBULATORY_CARE_PROVIDER_SITE_OTHER): Payer: Medicare Other

## 2020-04-07 DIAGNOSIS — J309 Allergic rhinitis, unspecified: Secondary | ICD-10-CM

## 2020-04-07 MED ORDER — MELOXICAM 7.5 MG PO TABS: 7.5000 mg | ORAL_TABLET | Freq: Every day | ORAL | 1 refills | Status: DC

## 2020-04-07 NOTE — Telephone Encounter (Signed)
I called the patient. X-ray of the right hip and pelvis was negative, no acute fracture or dislocation, no significant arthritic changes.   Reviewed previous MRI of lumbar spine with Dr. Krista Blue, felt symptoms of the right hip pain could be coming from the back.  She will increase gabapentin 600 mg 3 times a day, start meloxicam 7.5 mg daily, likely only for a few weeks to get through painful period.  If not better in 1 month, patient is to contact me, can consider repeating MRI of lumbar spine, even nerve conduction study.

## 2020-04-10 ENCOUNTER — Other Ambulatory Visit: Payer: Self-pay | Admitting: Family Medicine

## 2020-04-10 MED ORDER — ONETOUCH DELICA LANCING DEV MISC: 1.0000 | Freq: Once | 1 refills | Status: DC

## 2020-04-10 NOTE — Telephone Encounter (Signed)
SENT TO THE PHARMACY BY E-SCRIBE. 

## 2020-04-13 ENCOUNTER — Other Ambulatory Visit: Payer: Self-pay | Admitting: *Deleted

## 2020-04-13 ENCOUNTER — Telehealth: Payer: Self-pay

## 2020-04-13 NOTE — Telephone Encounter (Signed)
Received referral to assist patient in obtaining OneTouch Verio test strips.   Message from referral message: "Patient needs strips for new meter and having... This patient CBG meter stopped working. She called the DR and prescription sent to pharmacy  for a new meter. The one [facility] sent was One touch verio flex. They didn't send the patient any strips. The old meter the pharmacy don't have anymore so she had to get an order for the new one. Now couldn't send strips to the new meter. When she tried to get some they said they couldn't send any out until 04/28. Because they had sent strips to the other meter. Unknown when she would receive them. The patient tried to get some from [facility] and was told she would have to pay for them. Now my patient is unable to check her blood sugars.  Per patient this is unacceptable and she did file a complaint. Can we get her any strips quicker?"  Contacted [facility] and was told the same information. Earliest day test strips can be filled is; 4/28 due to "refill" being too soon and potentially received them around May 4-5th. After discussing with them, it is not a refill since it is a new meter brand, they were not able to provide further information why.   Was given pharmacy helpdesk number:  [phone], and they provided the same information. If patient fills it locally, she will have a copay. However, I was able to get an override with the mail order and requesting urgent shipping. Was told they have made fill a priority and patient should receive by this Friday or Saturday. If patient is wanting earlier date, she will have to pay $12.50.

## 2020-04-13 NOTE — Patient Outreach (Signed)
Kimberly Lam) Care Management  [place]  04/13/2020   Kimberly Lam Kimberly Lam, Kimberly Lam [phone]   RN Health Coach telephone call to patient.  [employer] compliance verified. Patient A1C has increased from 7.0 to 7.4. Per patient she had gotten off her diet a little and had cookies in the evenings. Patient meter had broken. Patient received new meter with no strips. Patient having difficulty receiving strips. Patient is exercising by walking. Patient has agreed to follow up outreach calls.    Encounter Medications:  Outpatient Encounter Medications as of 04/13/2020  Medication Sig Note  . Alcohol Swabs (B-D SINGLE USE SWABS REGULAR) PADS USE TO TEST BLOOD GLUCOSE FOUR TIMES DAILY   . alendronate (FOSAMAX) 70 MG tablet TAKE 1 TABLET BY MOUTH 1  TIME A WEEK. TAKE WITH A  FULL GLASS OF WATER ON AN  EMPTY STOMACH.   Marland Kitchen Ascorbic Acid (VITAMIN C) 1000 MG tablet Take 1,000 mg by mouth as needed. 04/11/2019: Patient states that she takes it daily   . Azelastine HCl 0.15 % SOLN Use 1-2 sprays per nostril twice a day as needed for sinus drainage.   Kimberly Lam Grape-Goldenseal (BERBERINE COMPLEX PO) Take by mouth daily.   . Calcium Carbonate-Vitamin D (CALCIUM 600+D PO) Take by mouth.   . cholecalciferol (VITAMIN D) 1000 units tablet Take 1,000 Units by mouth daily.   Marland Kitchen CINNAMON PO Take 1 tablet by mouth as needed.   . Cranberry 450 MG CAPS Take 1 capsule by mouth as needed. 04/11/2019: Patient takes it daily   . Cyanocobalamin (VITAMIN B-12 CR) 1500 MCG TBCR Take 1 tablet by mouth as needed. 04/11/2019: Patient takes once daily   . Echinacea-Goldenseal (ECHINACEA COMB/GOLDEN SEAL PO) Take 1 tablet by mouth as needed. Reported on 12/16/2015 03/14/2019: PRN   . EPINEPHrine (EPIPEN 2-PAK) 0.3 mg/0.3 mL IJ SOAJ injection Inject 0.3 mg into the muscle as needed for anaphylaxis.   . fluticasone (FLONASE) 50 MCG/ACT nasal spray Place 1 spray into both nostrils 2 (two) times daily.   Marland Kitchen  gabapentin (NEURONTIN) 300 MG capsule TAKE 1 CAPSULE BY MOUTH 4  TIMES DAILY   . Green Tea, Camillia sinensis, (GREEN TEA PO) Take by mouth as needed. Reported on 12/16/2015 03/14/2019: PRN  . ipratropium (ATROVENT) 0.06 % nasal spray USE 2 SPRAYS IN BOTH  NOSTRILS 3 TIMES DAILY AS  NEEDED FOR RHINITIS   . Lactobacillus (ACIDOPHILUS PROBIOTIC PO) Take 1 tablet by mouth daily.   . Lancets (ONETOUCH DELICA PLUS [id number]) MISC 1 each by Does not apply route in the morning, at noon, in the evening, and at bedtime.   Marland Kitchen levocetirizine (XYZAL) 5 MG tablet TAKE 1 TABLET BY MOUTH  EVERY EVENING   . MELATONIN ER PO Take by mouth.   . meloxicam (MOBIC) 7.5 MG tablet Take 1 tablet (7.5 mg total) by mouth daily.   . metFORMIN (GLUCOPHAGE-XR) 500 MG 24 hr tablet TAKE 2 TABLETS BY MOUTH  TWICE DAILY   . montelukast (SINGULAIR) 10 MG tablet Take 1 tablet (10 mg total) by mouth at bedtime.   . Omega-3 Fatty Acids (OMEGA 3 PO) Take 1 tablet by mouth as needed.   Marland Kitchen omeprazole (PRILOSEC) 40 MG capsule Take 40 mg by mouth daily.   Kimberly Lam VERIO test strip USE TO TEST BLOOD GLUCOSE FOUR TIMES DAILY   . Polyethyl Glycol-Propyl Glycol (SYSTANE FREE OP) Apply 1 drop to eye daily. Uses thera work for dr eyes per pt   . POTASSIUM  PO Take by mouth.   . ramipril (ALTACE) 2.5 MG capsule TAKE 1 CAPSULE BY MOUTH  DAILY   . simvastatin (ZOCOR) 20 MG tablet TAKE 1 TABLET BY MOUTH  DAILY   . Spacer/Aero-Holding Chambers (AEROCHAMBER PLUS) inhaler Use as instructed with MDI   . Stinging Nettle 2 % POWD Take 1 tablet by mouth as needed. Reported on 12/16/2015   . traZODone (DESYREL) 50 MG tablet TAKE 1/2 TO 1 TABLET BY  MOUTH AT BEDTIME AS NEEDED. FOR SLEEP   . TURMERIC PO Take 800 mg by mouth as needed. Reported on 12/16/2015 04/11/2019: Patient takes dialy  . VENTOLIN HFA 108 (90 Base) MCG/ACT inhaler Inhale 2 puffs into the lungs every 6 (six) hours as needed for wheezing or shortness of breath.    No facility-administered  encounter medications on file as of 04/13/2020.    Functional Status:  In your present state of health, do you have any difficulty performing the following activities: 01/14/2020  Hearing? Y  Comment sometimes sounds muffled  Vision? N  Difficulty concentrating or making decisions? N  Walking or climbing stairs? Y  Comment pain in knees when she hasto climb alot of stairs  Dressing or bathing? N  Doing errands, shopping? N  Preparing Food and eating ? N  Using the Toilet? N  In the past six months, have you accidently leaked urine? N  Do you have problems with loss of bowel control? N  Managing your Medications? N  Managing your Finances? N  Housekeeping or managing your Housekeeping? N  Some recent data might be hidden    Fall/Depression Screening: Fall Risk  01/14/2020 08/16/2019 05/16/2019  Falls in the past year? 0 0 0  Number falls in past yr: 0 - -  Injury with Fall? 0 - -  Follow up Falls evaluation completed - -   PHQ 2/9 Scores 01/14/2020 04/11/2019 03/12/2019 10/18/2018 06/13/2017  PHQ - 2 Score 0 0 0 0 0   THN CM Care Plan Problem One     Most Recent Value  Care Plan Problem One  Knowledge deficit related to diease management  Role Documenting the Problem One  Kimberly Lam for Problem One  Active  THN Long Term Goal   Patient will see a decrease in A1C  from 7.4 within the next 90 days  THN Long Term Goal Start Date  04/13/20  Interventions for Problem One Long Term Goal  RN discussed with patient he A1C has went up from 7.0 to 7.5. RN reiterated adhering to diet and eating healthy snacks. RN will follow up with further discussion.  THN CM Short Term Goal #1   Patient will verbalize having a better understanding of what snacks to eat for healthy choices within the next 90 days  THN CM Short Term Goal #1 Start Date  04/13/20  Interventions for Short Term Goal #1  RN reiterated with patient about eating healthy snacks. Patient received list of snacks. RN will  follow up with further discussion  THN CM Short Term Goal #2   Patient will verbalize having a better understanding of healthy diabetic food choices to make when dining out within the next 90 days  THN CM Short Term Goal #2 Met Date  04/13/20      Assessment:  A1C increased 7.4 from 7.0 Patient followed up with podiatrist Patient has new one touch verio flex meter Patient having difficulty getting strips for new meter Patient wears compression hose daily  Plan:  Referred to [place] RN discussed increase in A1C RN reiterated healthy eating RN reiterated healthy snacks RN will follow up within the month of July  Kimberly Lam Kimberly Lam Management [phone]

## 2020-04-14 ENCOUNTER — Ambulatory Visit (INDEPENDENT_AMBULATORY_CARE_PROVIDER_SITE_OTHER): Payer: Medicare Other

## 2020-04-14 DIAGNOSIS — J309 Allergic rhinitis, unspecified: Secondary | ICD-10-CM

## 2020-04-17 NOTE — Telephone Encounter (Signed)
Contacted patient to follow up on OneTouch Verio test strips.  Patient reported she did received them yesterday, 4/29.   Closing encounter.

## 2020-04-21 ENCOUNTER — Ambulatory Visit (INDEPENDENT_AMBULATORY_CARE_PROVIDER_SITE_OTHER): Payer: Medicare Other

## 2020-04-21 DIAGNOSIS — J309 Allergic rhinitis, unspecified: Secondary | ICD-10-CM | POA: Diagnosis not present

## 2020-04-27 ENCOUNTER — Ambulatory Visit
Admission: RE | Admit: 2020-04-27 | Discharge: 2020-04-27 | Disposition: A | Discharge status: Home / Self Care | Payer: Medicare Other | Source: Ambulatory Visit | Attending: Adult Health | Admitting: Adult Health

## 2020-04-27 ENCOUNTER — Encounter: Payer: Self-pay | Admitting: Adult Health

## 2020-04-27 ENCOUNTER — Other Ambulatory Visit: Payer: Self-pay

## 2020-04-27 DIAGNOSIS — M810 Age-related osteoporosis without current pathological fracture: Secondary | ICD-10-CM

## 2020-04-27 DIAGNOSIS — Z780 Asymptomatic menopausal state: Secondary | ICD-10-CM | POA: Diagnosis not present

## 2020-04-27 DIAGNOSIS — Z1231 Encounter for screening mammogram for malignant neoplasm of breast: Secondary | ICD-10-CM | POA: Diagnosis not present

## 2020-04-27 DIAGNOSIS — M8589 Other specified disorders of bone density and structure, multiple sites: Secondary | ICD-10-CM | POA: Diagnosis not present

## 2020-04-28 NOTE — Progress Notes (Signed)
I have reviewed and agreed above plan. 

## 2020-04-29 ENCOUNTER — Encounter: Payer: Self-pay | Admitting: Adult Health

## 2020-04-30 ENCOUNTER — Ambulatory Visit: Payer: Medicare Other | Attending: Internal Medicine

## 2020-04-30 DIAGNOSIS — Z23 Encounter for immunization: Secondary | ICD-10-CM

## 2020-04-30 NOTE — Progress Notes (Signed)
   Covid-19 Vaccination Clinic  Name:  Michae Plymire    MRN: [id number] DOB: Jul 19, 1946  04/30/2020  Ms. Pelphrey was observed post Covid-19 immunization for 30 minutes based on pre-vaccination screening without incident. She was provided with Vaccine Information Sheet and instruction to access the V-Safe system.   Ms. Reddington was instructed to call 911 with any severe reactions post vaccine: Marland Kitchen Difficulty breathing  . Swelling of face and throat  . A fast heartbeat  . A bad rash all over body  . Dizziness and weakness   Immunizations Administered    Name Date Dose VIS Date Route   Pfizer COVID-19 Vaccine 04/30/2020 12:36 PM 0.3 mL 02/12/2019 Intramuscular   Manufacturer: [place]   Lot: [id number]   [place]: [id number]

## 2020-05-05 ENCOUNTER — Ambulatory Visit (INDEPENDENT_AMBULATORY_CARE_PROVIDER_SITE_OTHER): Payer: Medicare Other

## 2020-05-05 DIAGNOSIS — J309 Allergic rhinitis, unspecified: Secondary | ICD-10-CM

## 2020-05-07 ENCOUNTER — Other Ambulatory Visit: Payer: Self-pay | Admitting: Allergy

## 2020-05-07 DIAGNOSIS — J309 Allergic rhinitis, unspecified: Secondary | ICD-10-CM

## 2020-05-07 NOTE — Telephone Encounter (Signed)
Benefit verification is in progress

## 2020-05-12 ENCOUNTER — Other Ambulatory Visit: Payer: Self-pay

## 2020-05-13 ENCOUNTER — Ambulatory Visit: Payer: Medicare Other | Admitting: Adult Health

## 2020-05-13 ENCOUNTER — Ambulatory Visit (INDEPENDENT_AMBULATORY_CARE_PROVIDER_SITE_OTHER): Payer: Medicare Other | Admitting: Adult Health

## 2020-05-13 ENCOUNTER — Encounter: Payer: Self-pay | Admitting: Adult Health

## 2020-05-13 VITALS — BP 106/56 | Temp 98.4°F | Wt 130.0 lb

## 2020-05-13 DIAGNOSIS — E119 Type 2 diabetes mellitus without complications: Secondary | ICD-10-CM

## 2020-05-13 LAB — POCT GLYCOSYLATED HEMOGLOBIN (HGB A1C): HbA1c, POC (controlled diabetic range): 6.7 % (ref 0.0–7.0)

## 2020-05-13 NOTE — Progress Notes (Deleted)
Subjective:    Patient ID: Kimberly Lam, female    DOB: 1946/10/01, 74 y.o.   MRN: [id number]  HPI 74 year old female who  has a past medical history of Acid reflux, Allergic rhinitis, Allergy, Anemia, Arthritis, Asthma, Bilateral sciatica, Cataract, Chronic bilateral low back pain, Colon polyps, Diverticulosis, DM type 2 (diabetes mellitus, type 2) ([place]), Environmental allergies, GERD (gastroesophageal reflux disease), Hyperlipidemia, Low back pain, Migraine, Osteoporosis, and PONV (postoperative nausea and vomiting).  She presents to the office today for 17-month follow-up regarding diabetes mellitus.  She is currently prescribed Metformin 1000 mg twice daily.  She does monitor her blood sugars at home and reports readings in the 130s.  She has not had any episodes of hypoglycemia.  During her appointment in February 2021 her A1c had increased from 7.0-7.41.  She was advised to work on lifestyle modifications in the lieu of adding an additional agent  Lab Results  Component Value Date   HGBA1C 7.4 (H) 02/13/2020     Review of Systems See HPI   Past Medical History:  Diagnosis Date  . Acid reflux   . Allergic rhinitis   . Allergy   . Anemia   . Arthritis    per MD- pt states no s/s of this   . Asthma   . Bilateral sciatica   . Cataract    removed both eyes   . Chronic bilateral low back pain   . Colon polyps   . Diverticulosis   . DM type 2 (diabetes mellitus, type 2) ([place])   . Environmental allergies    flowers, pollen, trees  . GERD (gastroesophageal reflux disease)   . Hyperlipidemia   . Low back pain   . Migraine    past hx- control as allergies are controlled   . Osteoporosis   . PONV (postoperative nausea and vomiting)     Social History   Socioeconomic History  . Marital status: Married    Spouse name: Not on file  . Number of children: 2  . Years of education: 39  . Highest education level: Not on file  Occupational History  . Occupation: Retired   Tobacco Use  . Smoking status: Never Smoker  . Smokeless tobacco: Never Used  Substance and Sexual Activity  . Alcohol use: No    Alcohol/week: 0.0 standard drinks  . Drug use: No  . Sexual activity: Not on file  Other Topics Concern  . Not on file  Social History Narrative      Married 1 son one daughter, relocated to [place] after living in [place] for many years. She is a Writer of [employer].   No caffeine.          Lives at home with her husband and brother.   Right-handed.   No caffeine use.   Social Determinants of Health   Financial Resource Strain:   . Difficulty of Paying Living Expenses:   Food Insecurity: No Food Insecurity  . Worried About Charity fundraiser in the Last Year: Never true  . Ran Out of Food in the Last Year: Never true  Transportation Needs: No Transportation Needs  . Lack of Transportation (Medical): No  . Lack of Transportation (Non-Medical): No  Physical Activity:   . Days of Exercise per Week:   . Minutes of Exercise per Session:   Stress:   . Feeling of Stress :   Social Connections:   . Frequency of Communication with  Friends and Family:   . Frequency of Social Gatherings with Friends and Family:   . Attends Religious Services:   . Active Member of Clubs or Organizations:   . Attends Archivist Meetings:   Marland Kitchen Marital Status:   Intimate Partner Violence:   . Fear of Current or Ex-Partner:   . Emotionally Abused:   Marland Kitchen Physically Abused:   . Sexually Abused:     Past Surgical History:  Procedure Laterality Date  . CATARACT EXTRACTION, BILATERAL Bilateral 2012  . CESAREAN SECTION     x 2  . COLONOSCOPY     > 10 yrs ago- unsure if removed polyps as was told sev.different things after this colon   . DENTAL SURGERY     molars removed   . DILATION AND CURETTAGE OF UTERUS    . NASAL SINUS SURGERY    . TUBAL LIGATION      Family History  Problem Relation Age of Onset  . Asthma Father   .  Emphysema Father   . Alcohol abuse Father   . Diabetes Mellitus II Brother        x 2  . Hyperlipidemia Brother   . Hypertension Brother   . Pancreatic cancer Brother   . Prostate cancer Brother   . Diabetes Brother   . Heart disease Maternal Grandmother        hardening of the arteries  . Heart murmur Daughter   . Glaucoma Brother   . Breast cancer Neg Hx   . Colon cancer Neg Hx   . Colon polyps Neg Hx   . Esophageal cancer Neg Hx   . Rectal cancer Neg Hx   . Stomach cancer Neg Hx     Allergies  Allergen Reactions  . Peanut-Containing Drug Products Anaphylaxis  . Eggs Or Egg-Derived Products Nausea Only    Nausea only- can eat cakes with eggs   . Milk-Related Compounds Nausea And Vomiting    Milk and cheese  . Actos [Pioglitazone] Nausea Only  . Soy Allergy Nausea Only    Pt states had allergy testing and was told allergic to soy-causes nausea but no other reactions  . Vicodin [Hydrocodone-Acetaminophen] Nausea Only    Current Outpatient Medications on File Prior to Visit  Medication Sig Dispense Refill  . Alcohol Swabs (B-D SINGLE USE SWABS REGULAR) PADS USE TO TEST BLOOD GLUCOSE FOUR TIMES DAILY 200 each 3  . alendronate (FOSAMAX) 70 MG tablet TAKE 1 TABLET BY MOUTH 1  TIME A WEEK. TAKE WITH A  FULL GLASS OF WATER ON AN  EMPTY STOMACH. 12 tablet 3  . Ascorbic Acid (VITAMIN C) 1000 MG tablet Take 1,000 mg by mouth as needed.    . Azelastine HCl 0.15 % SOLN Use 1-2 sprays per nostril twice a day as needed for sinus drainage. 30 mL 5  . Barberry-Oreg Grape-Goldenseal (BERBERINE COMPLEX PO) Take by mouth daily.    . Calcium Carbonate-Vitamin D (CALCIUM 600+D PO) Take by mouth.    . cholecalciferol (VITAMIN D) 1000 units tablet Take 1,000 Units by mouth daily.    Marland Kitchen CINNAMON PO Take 1 tablet by mouth as needed.    . Cranberry 450 MG CAPS Take 1 capsule by mouth as needed.    . Cyanocobalamin (VITAMIN B-12 CR) 1500 MCG TBCR Take 1 tablet by mouth as needed.    .  Echinacea-Goldenseal (ECHINACEA COMB/GOLDEN SEAL PO) Take 1 tablet by mouth as needed. Reported on 12/16/2015    . EPINEPHrine (EPIPEN  2-PAK) 0.3 mg/0.3 mL IJ SOAJ injection Inject 0.3 mg into the muscle as needed for anaphylaxis.    . fluticasone (FLONASE) 50 MCG/ACT nasal spray Place 1 spray into both nostrils 2 (two) times daily. 48 g 1  . gabapentin (NEURONTIN) 300 MG capsule TAKE 1 CAPSULE BY MOUTH 4  TIMES DAILY 360 capsule 3  . Green Tea, Camillia sinensis, (GREEN TEA PO) Take by mouth as needed. Reported on 12/16/2015    . ipratropium (ATROVENT) 0.06 % nasal spray USE 2 SPRAYS IN BOTH  NOSTRILS 3 TIMES DAILY AS  NEEDED FOR RHINITIS 90 mL 0  . Lactobacillus (ACIDOPHILUS PROBIOTIC PO) Take 1 tablet by mouth daily.    . Lancets (ONETOUCH DELICA PLUS [id number]) MISC 1 each by Does not apply route in the morning, at noon, in the evening, and at bedtime. 400 each 3  . levocetirizine (XYZAL) 5 MG tablet TAKE 1 TABLET BY MOUTH  EVERY EVENING 90 tablet 1  . MELATONIN ER PO Take by mouth.    . meloxicam (MOBIC) 7.5 MG tablet Take 1 tablet (7.5 mg total) by mouth daily. 30 tablet 1  . metFORMIN (GLUCOPHAGE-XR) 500 MG 24 hr tablet TAKE 2 TABLETS BY MOUTH  TWICE DAILY 360 tablet 3  . montelukast (SINGULAIR) 10 MG tablet TAKE 1 TABLET BY MOUTH AT  BEDTIME 90 tablet 1  . Omega-3 Fatty Acids (OMEGA 3 PO) Take 1 tablet by mouth as needed.    Marland Kitchen omeprazole (PRILOSEC) 40 MG capsule Take 40 mg by mouth daily.    Glory Rosebush VERIO test strip USE TO TEST BLOOD GLUCOSE FOUR TIMES DAILY 400 each 3  . Polyethyl Glycol-Propyl Glycol (SYSTANE FREE OP) Apply 1 drop to eye daily. Uses thera work for dr eyes per pt    . POTASSIUM PO Take by mouth.    . ramipril (ALTACE) 2.5 MG capsule TAKE 1 CAPSULE BY MOUTH  DAILY 90 capsule 3  . simvastatin (ZOCOR) 20 MG tablet TAKE 1 TABLET BY MOUTH  DAILY 90 tablet 3  . Spacer/Aero-Holding Chambers (AEROCHAMBER PLUS) inhaler Use as instructed with MDI 1 each 2  . Stinging Nettle 2  % POWD Take 1 tablet by mouth as needed. Reported on 12/16/2015    . traZODone (DESYREL) 50 MG tablet TAKE 1/2 TO 1 TABLET BY  MOUTH AT BEDTIME AS NEEDED. FOR SLEEP 90 tablet 3  . TURMERIC PO Take 800 mg by mouth as needed. Reported on 12/16/2015    . VENTOLIN HFA 108 (90 Base) MCG/ACT inhaler Inhale 2 puffs into the lungs every 6 (six) hours as needed for wheezing or shortness of breath.     No current facility-administered medications on file prior to visit.    There were no vitals taken for this visit.      Objective:   Physical Exam Vitals and nursing note reviewed.  Constitutional:      Appearance: Normal appearance.  Cardiovascular:     Rate and Rhythm: Normal rate and regular rhythm.     Pulses: Normal pulses.     Heart sounds: Normal heart sounds.  Pulmonary:     Effort: Pulmonary effort is normal.     Breath sounds: Normal breath sounds.  Skin:    General: Skin is warm and dry.     Capillary Refill: Capillary refill takes less than 2 seconds.  Neurological:     General: No focal deficit present.     Mental Status: She is alert and oriented to person, place, and time.  Psychiatric:        Mood and Affect: Mood normal.        Behavior: Behavior normal.        Thought Content: Thought content normal.        Judgment: Judgment normal.       Assessment & Plan:

## 2020-05-13 NOTE — Progress Notes (Signed)
Subjective:    Patient ID: Kimberly Lam, female    DOB: 1946/12/11, 74 y.o.   MRN: [id number]  HPI  She presents to the office today for 8-month follow-up regarding diabetes mellitus.  She is currently prescribed Metformin 1000 mg twice daily.  She does monitor her blood sugars at home and reports readings in the 130s.  She has not had any episodes of hypoglycemia.  During her appointment in February 2021 her A1c had increased from 7.0-7.4.  She was advised to work on lifestyle modifications in the lieu of adding an additional agent  Lab Results  Component Value Date   HGBA1C 7.4 (H) 02/13/2020   Today she reports that she has been modifying her diet and is exercising. Her blood sugars for the most part are in the 110-130's with a few in the 200's after a carb heavy diet. She has not had any issues with hypoglycemia  Review of Systems See HPI   Past Medical History:  Diagnosis Date  . Acid reflux   . Allergic rhinitis   . Allergy   . Anemia   . Arthritis    per MD- pt states no s/s of this   . Asthma   . Bilateral sciatica   . Cataract    removed both eyes   . Chronic bilateral low back pain   . Colon polyps   . Diverticulosis   . DM type 2 (diabetes mellitus, type 2) ([place])   . Environmental allergies    flowers, pollen, trees  . GERD (gastroesophageal reflux disease)   . Hyperlipidemia   . Low back pain   . Migraine    past hx- control as allergies are controlled   . Osteoporosis   . PONV (postoperative nausea and vomiting)     Social History   Socioeconomic History  . Marital status: Married    Spouse name: Not on file  . Number of children: 2  . Years of education: 41  . Highest education level: Not on file  Occupational History  . Occupation: Retired  Tobacco Use  . Smoking status: Never Smoker  . Smokeless tobacco: Never Used  Substance and Sexual Activity  . Alcohol use: No    Alcohol/week: 0.0 standard drinks  . Drug use: No  . Sexual  activity: Not on file  Other Topics Concern  . Not on file  Social History Narrative      Married 1 son one daughter, relocated to [place] after living in [place] for many years. She is a Writer of [employer]   No caffeine.          Lives at home with her husband and brother.   Right-handed.   No caffeine use.   Social Determinants of Health   Financial Resource Strain:   . Difficulty of Paying Living Expenses:   Food Insecurity: No Food Insecurity  . Worried About Charity fundraiser in the Last Year: Never true  . Ran Out of Food in the Last Year: Never true  Transportation Needs: No Transportation Needs  . Lack of Transportation (Medical): No  . Lack of Transportation (Non-Medical): No  Physical Activity:   . Days of Exercise per Week:   . Minutes of Exercise per Session:   Stress:   . Feeling of Stress :   Social Connections:   . Frequency of Communication with Friends and Family:   . Frequency of Social Gatherings with Friends and Family:   .  Attends Religious Services:   . Active Member of Clubs or Organizations:   . Attends Archivist Meetings:   Marland Kitchen Marital Status:   Intimate Partner Violence:   . Fear of Current or Ex-Partner:   . Emotionally Abused:   Marland Kitchen Physically Abused:   . Sexually Abused:     Past Surgical History:  Procedure Laterality Date  . CATARACT EXTRACTION, BILATERAL Bilateral 2012  . CESAREAN SECTION     x 2  . COLONOSCOPY     > 10 yrs ago- unsure if removed polyps as was told sev.different things after this colon   . DENTAL SURGERY     molars removed   . DILATION AND CURETTAGE OF UTERUS    . NASAL SINUS SURGERY    . TUBAL LIGATION      Family History  Problem Relation Age of Onset  . Asthma Father   . Emphysema Father   . Alcohol abuse Father   . Diabetes Mellitus II Brother        x 2  . Hyperlipidemia Brother   . Hypertension Brother   . Pancreatic cancer Brother   . Prostate cancer Brother    . Diabetes Brother   . Heart disease Maternal Grandmother        hardening of the arteries  . Heart murmur Daughter   . Glaucoma Brother   . Breast cancer Neg Hx   . Colon cancer Neg Hx   . Colon polyps Neg Hx   . Esophageal cancer Neg Hx   . Rectal cancer Neg Hx   . Stomach cancer Neg Hx     Allergies  Allergen Reactions  . Peanut-Containing Drug Products Anaphylaxis  . Eggs Or Egg-Derived Products Nausea Only    Nausea only- can eat cakes with eggs   . Milk-Related Compounds Nausea And Vomiting    Milk and cheese  . Actos [Pioglitazone] Nausea Only  . Soy Allergy Nausea Only    Pt states had allergy testing and was told allergic to soy-causes nausea but no other reactions  . Vicodin [Hydrocodone-Acetaminophen] Nausea Only    Current Outpatient Medications on File Prior to Visit  Medication Sig Dispense Refill  . Alcohol Swabs (B-D SINGLE USE SWABS REGULAR) PADS USE TO TEST BLOOD GLUCOSE FOUR TIMES DAILY 200 each 3  . alendronate (FOSAMAX) 70 MG tablet TAKE 1 TABLET BY MOUTH 1  TIME A WEEK. TAKE WITH A  FULL GLASS OF WATER ON AN  EMPTY STOMACH. 12 tablet 3  . Ascorbic Acid (VITAMIN C) 1000 MG tablet Take 1,000 mg by mouth as needed.    . Azelastine HCl 0.15 % SOLN Use 1-2 sprays per nostril twice a day as needed for sinus drainage. 30 mL 5  . Barberry-Oreg Grape-Goldenseal (BERBERINE COMPLEX PO) Take by mouth daily.    . Calcium Carbonate-Vitamin D (CALCIUM 600+D PO) Take by mouth.    . cholecalciferol (VITAMIN D) 1000 units tablet Take 1,000 Units by mouth daily.    Marland Kitchen CINNAMON PO Take 1 tablet by mouth as needed.    . Cranberry 450 MG CAPS Take 1 capsule by mouth as needed.    . Cyanocobalamin (VITAMIN B-12 CR) 1500 MCG TBCR Take 1 tablet by mouth as needed.    . Echinacea-Goldenseal (ECHINACEA COMB/GOLDEN SEAL PO) Take 1 tablet by mouth as needed. Reported on 12/16/2015    . EPINEPHrine (EPIPEN 2-PAK) 0.3 mg/0.3 mL IJ SOAJ injection Inject 0.3 mg into the muscle as needed  for anaphylaxis.    Marland Kitchen  fluticasone (FLONASE) 50 MCG/ACT nasal spray Place 1 spray into both nostrils 2 (two) times daily. 48 g 1  . gabapentin (NEURONTIN) 300 MG capsule TAKE 1 CAPSULE BY MOUTH 4  TIMES DAILY 360 capsule 3  . Green Tea, Camillia sinensis, (GREEN TEA PO) Take by mouth as needed. Reported on 12/16/2015    . ipratropium (ATROVENT) 0.06 % nasal spray USE 2 SPRAYS IN BOTH  NOSTRILS 3 TIMES DAILY AS  NEEDED FOR RHINITIS 90 mL 0  . Lactobacillus (ACIDOPHILUS PROBIOTIC PO) Take 1 tablet by mouth daily.    . Lancets (ONETOUCH DELICA PLUS [id number]) MISC 1 each by Does not apply route in the morning, at noon, in the evening, and at bedtime. 400 each 3  . levocetirizine (XYZAL) 5 MG tablet TAKE 1 TABLET BY MOUTH  EVERY EVENING 90 tablet 1  . MELATONIN ER PO Take by mouth.    . meloxicam (MOBIC) 7.5 MG tablet Take 1 tablet (7.5 mg total) by mouth daily. 30 tablet 1  . metFORMIN (GLUCOPHAGE-XR) 500 MG 24 hr tablet TAKE 2 TABLETS BY MOUTH  TWICE DAILY 360 tablet 3  . montelukast (SINGULAIR) 10 MG tablet TAKE 1 TABLET BY MOUTH AT  BEDTIME 90 tablet 1  . Omega-3 Fatty Acids (OMEGA 3 PO) Take 1 tablet by mouth as needed.    Marland Kitchen omeprazole (PRILOSEC) 40 MG capsule Take 40 mg by mouth daily.    Glory Rosebush VERIO test strip USE TO TEST BLOOD GLUCOSE FOUR TIMES DAILY 400 each 3  . Polyethyl Glycol-Propyl Glycol (SYSTANE FREE OP) Apply 1 drop to eye daily. Uses thera work for dr eyes per pt    . POTASSIUM PO Take by mouth.    . ramipril (ALTACE) 2.5 MG capsule TAKE 1 CAPSULE BY MOUTH  DAILY 90 capsule 3  . simvastatin (ZOCOR) 20 MG tablet TAKE 1 TABLET BY MOUTH  DAILY 90 tablet 3  . Spacer/Aero-Holding Chambers (AEROCHAMBER PLUS) inhaler Use as instructed with MDI 1 each 2  . Stinging Nettle 2 % POWD Take 1 tablet by mouth as needed. Reported on 12/16/2015    . traZODone (DESYREL) 50 MG tablet TAKE 1/2 TO 1 TABLET BY  MOUTH AT BEDTIME AS NEEDED. FOR SLEEP 90 tablet 3  . TURMERIC PO Take 800 mg by mouth  as needed. Reported on 12/16/2015    . VENTOLIN HFA 108 (90 Base) MCG/ACT inhaler Inhale 2 puffs into the lungs every 6 (six) hours as needed for wheezing or shortness of breath.     No current facility-administered medications on file prior to visit.    BP (!) 106/56   Temp 98.4 F (36.9 C)   Wt 130 lb (59 kg)   BMI 22.31 kg/m       Objective:   Physical Exam Vitals and nursing note reviewed.  Constitutional:      Appearance: Normal appearance.  Skin:    General: Skin is warm and dry.  Neurological:     General: No focal deficit present.     Mental Status: She is alert and oriented to person, place, and time.  Psychiatric:        Mood and Affect: Mood normal.        Behavior: Behavior normal.        Thought Content: Thought content normal.        Judgment: Judgment normal.       Assessment & Plan:  1. Diabetes mellitus without complication (HCC)  - POCT A1C- 6.7  -  Has improved and at goal. Continue with Metformin and lifestyle modifications  - Follow up in 6 months or sooner if needed  [name], NP

## 2020-05-19 ENCOUNTER — Ambulatory Visit (INDEPENDENT_AMBULATORY_CARE_PROVIDER_SITE_OTHER): Payer: Medicare Other

## 2020-05-19 ENCOUNTER — Telehealth: Payer: Self-pay | Admitting: *Deleted

## 2020-05-19 DIAGNOSIS — J309 Allergic rhinitis, unspecified: Secondary | ICD-10-CM

## 2020-05-19 NOTE — Telephone Encounter (Signed)
Cost for prolia is $270.  [facility] [employer] telephone number given to patient.  Patient to call back if interested.

## 2020-05-25 ENCOUNTER — Ambulatory Visit: Payer: Medicare Other | Attending: Internal Medicine

## 2020-05-25 DIAGNOSIS — Z23 Encounter for immunization: Secondary | ICD-10-CM

## 2020-05-25 NOTE — Progress Notes (Signed)
   Covid-19 Vaccination Clinic  Name:  Kimberly Lam    MRN: [id number] DOB: 02/23/1946  05/25/2020  [name] was observed post Covid-19 immunization for 15 minutes without incident. She was provided with Vaccine Information Sheet and instruction to access the V-Safe system.   [name] was instructed to call 911 with any severe reactions post vaccine: Marland Kitchen Difficulty breathing  . Swelling of face and throat  . A fast heartbeat  . A bad rash all over body  . Dizziness and weakness   Immunizations Administered    Name Date Dose VIS Date Route   Pfizer COVID-19 Vaccine 05/25/2020 12:26 PM 0.3 mL 02/12/2019 Intramuscular   Manufacturer: [place]   Lot: [id number]   [place]: [id number]

## 2020-06-02 ENCOUNTER — Ambulatory Visit (INDEPENDENT_AMBULATORY_CARE_PROVIDER_SITE_OTHER): Payer: Medicare Other

## 2020-06-02 DIAGNOSIS — J309 Allergic rhinitis, unspecified: Secondary | ICD-10-CM | POA: Diagnosis not present

## 2020-06-16 ENCOUNTER — Ambulatory Visit (INDEPENDENT_AMBULATORY_CARE_PROVIDER_SITE_OTHER): Payer: Medicare Other

## 2020-06-16 DIAGNOSIS — J309 Allergic rhinitis, unspecified: Secondary | ICD-10-CM | POA: Diagnosis not present

## 2020-06-24 DIAGNOSIS — H04123 Dry eye syndrome of bilateral lacrimal glands: Secondary | ICD-10-CM | POA: Diagnosis not present

## 2020-07-01 NOTE — Progress Notes (Signed)
EXP 07/01/21

## 2020-07-02 ENCOUNTER — Ambulatory Visit (INDEPENDENT_AMBULATORY_CARE_PROVIDER_SITE_OTHER): Payer: Medicare Other

## 2020-07-02 DIAGNOSIS — J309 Allergic rhinitis, unspecified: Secondary | ICD-10-CM

## 2020-07-06 DIAGNOSIS — J3089 Other allergic rhinitis: Secondary | ICD-10-CM | POA: Diagnosis not present

## 2020-07-07 DIAGNOSIS — J301 Allergic rhinitis due to pollen: Secondary | ICD-10-CM | POA: Diagnosis not present

## 2020-07-08 DIAGNOSIS — J3081 Allergic rhinitis due to animal (cat) (dog) hair and dander: Secondary | ICD-10-CM | POA: Diagnosis not present

## 2020-07-13 ENCOUNTER — Other Ambulatory Visit: Payer: Self-pay | Admitting: *Deleted

## 2020-07-13 NOTE — Patient Outreach (Signed)
[facility]) Care Management  [place]  07/13/2020   Kimberly Lam 20-Oct-1946 [id number]  RN Health Coach telephone call to patient.  [employer] compliance verified. Per patient she is doing good. Patient A1C is 6.7 from 7.4. Per patient her appetite is good. Patient is exercising daily by walking. Patient does not have a medical alert system. Patient has agreed to follow up outreach calls.  Encounter Medications:  Outpatient Encounter Medications as of 07/13/2020  Medication Sig Note  . Alcohol Swabs (B-D SINGLE USE SWABS REGULAR) PADS USE TO TEST BLOOD GLUCOSE FOUR TIMES DAILY   . alendronate (FOSAMAX) 70 MG tablet TAKE 1 TABLET BY MOUTH 1  TIME A WEEK. TAKE WITH A  FULL GLASS OF WATER ON AN  EMPTY STOMACH.   Marland Kitchen Ascorbic Acid (VITAMIN C) 1000 MG tablet Take 1,000 mg by mouth as needed. 04/11/2019: Patient states that she takes it daily   . Azelastine HCl 0.15 % SOLN Use 1-2 sprays per nostril twice a day as needed for sinus drainage.   Jolyne Loa Grape-Goldenseal (BERBERINE COMPLEX PO) Take by mouth daily.   . Calcium Carbonate-Vitamin D (CALCIUM 600+D PO) Take by mouth.   . cholecalciferol (VITAMIN D) 1000 units tablet Take 1,000 Units by mouth daily.   Marland Kitchen CINNAMON PO Take 1 tablet by mouth as needed.   . Cranberry 450 MG CAPS Take 1 capsule by mouth as needed. 04/11/2019: Patient takes it daily   . Cyanocobalamin (VITAMIN B-12 CR) 1500 MCG TBCR Take 1 tablet by mouth as needed. 04/11/2019: Patient takes once daily   . Echinacea-Goldenseal (ECHINACEA COMB/GOLDEN SEAL PO) Take 1 tablet by mouth as needed. Reported on 12/16/2015 03/14/2019: PRN   . EPINEPHrine (EPIPEN 2-PAK) 0.3 mg/0.3 mL IJ SOAJ injection Inject 0.3 mg into the muscle as needed for anaphylaxis.   . fluticasone (FLONASE) 50 MCG/ACT nasal spray Place 1 spray into both nostrils 2 (two) times daily.   Marland Kitchen gabapentin (NEURONTIN) 300 MG capsule TAKE 1 CAPSULE BY MOUTH 4  TIMES DAILY   . Green Tea,  Camillia sinensis, (GREEN TEA PO) Take by mouth as needed. Reported on 12/16/2015 03/14/2019: PRN  . ipratropium (ATROVENT) 0.06 % nasal spray USE 2 SPRAYS IN BOTH  NOSTRILS 3 TIMES DAILY AS  NEEDED FOR RHINITIS   . Lactobacillus (ACIDOPHILUS PROBIOTIC PO) Take 1 tablet by mouth daily.   . Lancets (ONETOUCH DELICA PLUS [id number]) MISC 1 each by Does not apply route in the morning, at noon, in the evening, and at bedtime.   Marland Kitchen levocetirizine (XYZAL) 5 MG tablet TAKE 1 TABLET BY MOUTH  EVERY EVENING   . MELATONIN ER PO Take by mouth.   . meloxicam (MOBIC) 7.5 MG tablet Take 1 tablet (7.5 mg total) by mouth daily.   . metFORMIN (GLUCOPHAGE-XR) 500 MG 24 hr tablet TAKE 2 TABLETS BY MOUTH  TWICE DAILY   . montelukast (SINGULAIR) 10 MG tablet TAKE 1 TABLET BY MOUTH AT  BEDTIME   . Omega-3 Fatty Acids (OMEGA 3 PO) Take 1 tablet by mouth as needed.   Marland Kitchen omeprazole (PRILOSEC) 40 MG capsule Take 40 mg by mouth daily.   Glory Rosebush VERIO test strip USE TO TEST BLOOD GLUCOSE FOUR TIMES DAILY   . Polyethyl Glycol-Propyl Glycol (SYSTANE FREE OP) Apply 1 drop to eye daily. Uses thera work for dr eyes per pt   . POTASSIUM PO Take by mouth.   . ramipril (ALTACE) 2.5 MG capsule TAKE 1 CAPSULE BY MOUTH  DAILY   . simvastatin (ZOCOR) 20 MG tablet TAKE 1 TABLET BY MOUTH  DAILY   . Spacer/Aero-Holding Chambers (AEROCHAMBER PLUS) inhaler Use as instructed with MDI   . Stinging Nettle 2 % POWD Take 1 tablet by mouth as needed. Reported on 12/16/2015   . traZODone (DESYREL) 50 MG tablet TAKE 1/2 TO 1 TABLET BY  MOUTH AT BEDTIME AS NEEDED. FOR SLEEP   . TURMERIC PO Take 800 mg by mouth as needed. Reported on 12/16/2015 04/11/2019: Patient takes dialy  . VENTOLIN HFA 108 (90 Base) MCG/ACT inhaler Inhale 2 puffs into the lungs every 6 (six) hours as needed for wheezing or shortness of breath.    No facility-administered encounter medications on file as of 07/13/2020.    Functional Status:  In your present state of health,  do you have any difficulty performing the following activities: 01/14/2020  Hearing? Y  Comment sometimes sounds muffled  Vision? N  Difficulty concentrating or making decisions? N  Walking or climbing stairs? Y  Comment pain in knees when she hasto climb alot of stairs  Dressing or bathing? N  Doing errands, shopping? N  Preparing Food and eating ? N  Using the Toilet? N  In the past six months, have you accidently leaked urine? N  Do you have problems with loss of bowel control? N  Managing your Medications? N  Managing your Finances? N  Housekeeping or managing your Housekeeping? N  Some recent data might be hidden    Fall/Depression Screening: Fall Risk  01/14/2020 08/16/2019 05/16/2019  Falls in the past year? 0 0 0  Number falls in past yr: 0 - -  Injury with Fall? 0 - -  Follow up Falls evaluation completed - -   PHQ 2/9 Scores 01/14/2020 04/11/2019 03/12/2019 10/18/2018 06/13/2017  PHQ - 2 Score 0 0 0 0 0   Goals Addressed              This Visit's Progress   .  Diabetes Patient stated goal (pt-stated)        CARE PLAN ENTRY (see longtitudinal plan of care for additional care plan information)  Objective:  Lab Results  Component Value Date   HGBA1C 6.7 05/13/2020 .   Lab Results  Component Value Date   CREATININE 0.87 02/13/2020   CREATININE 0.70 10/22/2018   CREATININE 0.89 10/18/2018 .   Marland Kitchen No results found for: EGFR  Current Barriers:  Marland Kitchen Knowledge Deficits related to basic Diabetes pathophysiology and self care/management  Case Manager Clinical Goal(s):  Over the next 90 days, patient will demonstrate improved adherence to prescribed treatment plan for diabetes self care/management as evidenced by: Maintaining A1C at 6.7 or < . Verbalize daily monitoring and recording of CBG within 90 days . Verbalize adherence to ADA/ carb modified diet within the next 90 days . Verbalize exercise 3 days/week . Verbalize adherence to prescribed medication regimen within  the next 90 days   Interventions:  . Provided education to patient about basic DM disease process . Discussed plans with patient for ongoing care management follow up and provided patient with direct contact information for care management team . Advised patient on obtaining a medical alert system . Provided patient with educational material on Healthy Meal Planning  Patient Self Care Activities:  . Self administers oral medications as prescribed . Attends all scheduled provider appointments . Checks blood sugars as prescribed and utilize hyper and hypoglycemia protocol as needed . Adheres to prescribed ADA/carb modified .  Call for Medical alert system  [place] will outreach within the month of October        Assessment:  A1C is 6.7 from 7.4 Appetite good Does not have a medical alert system Patient is trying to eat healthy  Plan:  RN provided encouragement for reaching goal of A1C of 6.7 RN discussed medical alert system RN sent [facility] medical alert system number to obtain a system RN sent [place] educational material RN will follow up outreach within the month of October  Darric Plante [place] Management [phone]

## 2020-07-15 ENCOUNTER — Ambulatory Visit (INDEPENDENT_AMBULATORY_CARE_PROVIDER_SITE_OTHER): Payer: Medicare Other

## 2020-07-15 DIAGNOSIS — J309 Allergic rhinitis, unspecified: Secondary | ICD-10-CM

## 2020-07-16 ENCOUNTER — Other Ambulatory Visit: Payer: Self-pay | Admitting: Neurology

## 2020-07-21 ENCOUNTER — Ambulatory Visit: Payer: Medicare Other | Admitting: Neurology

## 2020-07-21 NOTE — Progress Notes (Deleted)
PATIENT: Kimberly Lam DOB: [birth year]  REASON FOR VISIT: follow up HISTORY FROM: patient  HISTORY OF PRESENT ILLNESS: Today 07/21/20  HISTORY  Kimberly Seebeck Prudenis a 74 years old female, seen in refer by her primary care nurse practitioner [name] for evaluation of leg cramping, tremors, initial evaluation was on January 23, 2018.   She has past medical history of hypertension, diabetes, hyperlipidemia  I saw her initially in 2016 for evaluation of low back pain, radiating pain to bilateral hip, bilateral leg muscle cramping, and weakness.  She has history of low back pain since 2006, midline low back, occasionally go to posterior thigh, go down below her knee, worsening with prolonged standing, or walking,  Previous her low back pain has responded very well to epidural injection, but the benefit gradually fades away, over the years, she complains of worsening low back pain, radiating pain to bilateral lower extremity again, in addition, she complains of bilateral feet paresthesia,   She had a history of diabetes, she denies gait difficulty, no bowel and bladder incontinence, she denies bilateral upper extremity paresthesia or weakness.   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   Note from Dr. Franciso Bend, [facility], [place] in 2017, she was diagnosed with degenerative spondylolisthesis L4-L5, L5-S1, neurogenic claudication, low back pain, received epidural injection in Feb 2015, benefit lasted about one year  MRI of lumbar spine December 2016, There is a transitional L6/S1 vertebral body. 8 mm of anterolisthesis of L5 upon L6/S1 of a degenerative nature due to severe facet hypertrophy. At this level, there is mild spinal stenosis and moderately severe right foraminal narrowing. There could be  compression of the right L5 nerve root. Degenerative changes at L6/S1-S2 lead to severe right foraminal narrowing that could lead to compression of the exiting right S1 nerve root  She continue complains of chronic low back pain, today she also complains of new onset right hand shaking, that was intermittent, getting worse when she holding utensils, writing, mild involvement on the left side, she continue complains of frequent lower extremity cramping especially on the right side, difficulty to walk in the morning. There was no family history of tremor  UPDATE Nov 5th 2019: She was given prescription of gabapentin for her bilateral feet paresthesia, 300 mg 4 times a day does help her lower extremity cramps, but complains of daytime sleepiness, but she often woke up in the middle of the night complains of left muscle cramping, difficulty going to sleep,  We again personally reviewed MRI of lumbar spine in December 2016, transitional L6-S1 vertebral body, 8 mm of anterolisthesis of L5 upon S1 with severe facet hypertrophy, mild spinal stenosis, moderate severe right foraminal narrowing, potential compression of right L5 nerve roots,  UPDATE July 10 2019: She is overall doing much better, taking gabapentin 300 mg 2 tablets before bedtime, open skip daytime dose, epidural injection few months ago has helped her as well, she exercise regularly, denies gait abnormality, denies bowel and bladder incontinence  Update December 3, 2020SS:She overall continues to do very well. Apparently, she rotates hergabapentin dosing, says she takes gabapentin 300 mg, 2 capsules at least twice a day, at most she may take 4 times a day. She adjust the regimen depending on her leg cramps. She also has some right hip pain. She knows that her low back gets to  bothering her, she will do stretching, and she is able to control it well. She is going to relocate to [place], but may still come here if she can't find  another neurologist.   Update April 06, 2020 SS: Here today to discuss new problem of right hip pain.  Indicates since Friday, right hip/pelvic pain, with weightbearing and turning.  She denies fall or injury.  She denies pain down the legs, no numbness or weakness.  She denies any urinary symptoms.  She remains on gabapentin 600 mg twice a day for cramping in her legs.  Indicates this pain is not typical of her chronic low back pain, which is usually in the center of her back.  She presents today unaccompanied. She has not tried tylenol or ibuprofen.  Update July 21, 2020 SS: X-ray of the right hip and pelvis was negative, no acute fracture or dislocation, no significant arthritic changes, right hip pain could be coming from the back, gabapentin increased 600 mg 3 times a day, meloxicam 7.5 mg daily  REVIEW OF SYSTEMS: Out of a complete 14 system review of symptoms, the patient complains only of the following symptoms, and all other reviewed systems are negative.  ALLERGIES: Allergies  Allergen Reactions  . Peanut-Containing Drug Products Anaphylaxis  . Eggs Or Egg-Derived Products Nausea Only    Nausea only- can eat cakes with eggs   . Milk-Related Compounds Nausea And Vomiting    Milk and cheese  . Actos [Pioglitazone] Nausea Only  . Soy Allergy Nausea Only    Pt states had allergy testing and was told allergic to soy-causes nausea but no other reactions  . Vicodin [Hydrocodone-Acetaminophen] Nausea Only    HOME MEDICATIONS: Outpatient Medications Prior to Visit  Medication Sig Dispense Refill  . Alcohol Swabs (B-D SINGLE USE SWABS REGULAR) PADS USE TO TEST BLOOD GLUCOSE FOUR TIMES DAILY 200 each 3  . alendronate (FOSAMAX) 70 MG tablet TAKE 1 TABLET BY MOUTH 1  TIME A WEEK. TAKE WITH A  FULL GLASS OF WATER ON AN  EMPTY STOMACH. 12 tablet 3  . Ascorbic Acid (VITAMIN C) 1000 MG tablet Take 1,000 mg by mouth as needed.    . Azelastine HCl 0.15 % SOLN Use 1-2 sprays per nostril twice a  day as needed for sinus drainage. 30 mL 5  . Barberry-Oreg Grape-Goldenseal (BERBERINE COMPLEX PO) Take by mouth daily.    . Calcium Carbonate-Vitamin D (CALCIUM 600+D PO) Take by mouth.    . cholecalciferol (VITAMIN D) 1000 units tablet Take 1,000 Units by mouth daily.    Marland Kitchen CINNAMON PO Take 1 tablet by mouth as needed.    . Cranberry 450 MG CAPS Take 1 capsule by mouth as needed.    . Cyanocobalamin (VITAMIN B-12 CR) 1500 MCG TBCR Take 1 tablet by mouth as needed.    . Echinacea-Goldenseal (ECHINACEA COMB/GOLDEN SEAL PO) Take 1 tablet by mouth as needed. Reported on 12/16/2015    . EPINEPHrine (EPIPEN 2-PAK) 0.3 mg/0.3 mL IJ SOAJ injection Inject 0.3 mg into the muscle as needed for anaphylaxis.    . fluticasone (FLONASE) 50 MCG/ACT nasal spray Place 1 spray into both nostrils 2 (two) times daily. 48 g 1  . gabapentin (NEURONTIN) 300 MG capsule TAKE 1 CAPSULE BY MOUTH 4  TIMES DAILY 360 capsule 3  . Green Tea, Camillia sinensis, (GREEN TEA PO) Take by mouth as needed. Reported on 12/16/2015    . ipratropium (ATROVENT) 0.06 % nasal spray USE 2 SPRAYS IN BOTH  NOSTRILS 3 TIMES DAILY AS  NEEDED FOR RHINITIS 90 mL 0  . Lactobacillus (ACIDOPHILUS PROBIOTIC PO) Take 1 tablet by mouth daily.    . Lancets (ONETOUCH DELICA PLUS UTMLYY50P) MISC 1 each by Does not apply route in the morning, at noon, in the evening, and at bedtime. 400 each 3  . levocetirizine (XYZAL) 5 MG tablet TAKE 1 TABLET BY MOUTH  EVERY EVENING 90 tablet 1  . MELATONIN ER PO Take by mouth.    . meloxicam (MOBIC) 7.5 MG tablet Take 1 tablet (7.5 mg total) by mouth daily. 30 tablet 1  . metFORMIN (GLUCOPHAGE-XR) 500 MG 24 hr tablet TAKE 2 TABLETS BY MOUTH  TWICE DAILY 360 tablet 3  . montelukast (SINGULAIR) 10 MG tablet TAKE 1 TABLET BY MOUTH AT  BEDTIME 90 tablet 1  . Omega-3 Fatty Acids (OMEGA 3 PO) Take 1 tablet by mouth as needed.    Marland Kitchen omeprazole (PRILOSEC) 40 MG capsule Take 40 mg by mouth daily.    Glory Rosebush VERIO test strip  USE TO TEST BLOOD GLUCOSE FOUR TIMES DAILY 400 each 3  . Polyethyl Glycol-Propyl Glycol (SYSTANE FREE OP) Apply 1 drop to eye daily. Uses thera work for dr eyes per pt    . POTASSIUM PO Take by mouth.    . ramipril (ALTACE) 2.5 MG capsule TAKE 1 CAPSULE BY MOUTH  DAILY 90 capsule 3  . simvastatin (ZOCOR) 20 MG tablet TAKE 1 TABLET BY MOUTH  DAILY 90 tablet 3  . Spacer/Aero-Holding Chambers (AEROCHAMBER PLUS) inhaler Use as instructed with MDI 1 each 2  . Stinging Nettle 2 % POWD Take 1 tablet by mouth as needed. Reported on 12/16/2015    . traZODone (DESYREL) 50 MG tablet TAKE 1/2 TO 1 TABLET BY  MOUTH AT BEDTIME AS NEEDED. FOR SLEEP 90 tablet 3  . TURMERIC PO Take 800 mg by mouth as needed. Reported on 12/16/2015    . VENTOLIN HFA 108 (90 Base) MCG/ACT inhaler Inhale 2 puffs into the lungs every 6 (six) hours as needed for wheezing or shortness of breath.     No facility-administered medications prior to visit.    PAST MEDICAL HISTORY: Past Medical History:  Diagnosis Date  . Acid reflux   . Allergic rhinitis   . Allergy   . Anemia   . Arthritis    per MD- pt states no s/s of this   . Asthma   . Bilateral sciatica   . Cataract    removed both eyes   . Chronic bilateral low back pain   . Colon polyps   . Diverticulosis   . DM type 2 (diabetes mellitus, type 2) ([place])   . Environmental allergies    flowers, pollen, trees  . GERD (gastroesophageal reflux disease)   . Hyperlipidemia   . Low back pain   . Migraine    past hx- control as allergies are controlled   . Osteoporosis   . PONV (postoperative nausea and vomiting)     PAST SURGICAL HISTORY: Past Surgical History:  Procedure Laterality Date  . CATARACT EXTRACTION, BILATERAL Bilateral 2012  . CESAREAN SECTION     x 2  . COLONOSCOPY     > 10 yrs ago- unsure if removed polyps as was told sev.different things after this colon   . DENTAL SURGERY     molars removed   . DILATION AND CURETTAGE OF UTERUS    . NASAL SINUS  SURGERY    . TUBAL LIGATION  FAMILY HISTORY: Family History  Problem Relation Age of Onset  . Asthma Father   . Emphysema Father   . Alcohol abuse Father   . Diabetes Mellitus II Brother        x 2  . Hyperlipidemia Brother   . Hypertension Brother   . Pancreatic cancer Brother   . Prostate cancer Brother   . Diabetes Brother   . Heart disease Maternal Grandmother        hardening of the arteries  . Heart murmur Daughter   . Glaucoma Brother   . Breast cancer Neg Hx   . Colon cancer Neg Hx   . Colon polyps Neg Hx   . Esophageal cancer Neg Hx   . Rectal cancer Neg Hx   . Stomach cancer Neg Hx     SOCIAL HISTORY: Social History   Socioeconomic History  . Marital status: Married    Spouse name: Not on file  . Number of children: 2  . Years of education: 13  . Highest education level: Not on file  Occupational History  . Occupation: Retired  Tobacco Use  . Smoking status: Never Smoker  . Smokeless tobacco: Never Used  Vaping Use  . Vaping Use: Never used  Substance and Sexual Activity  . Alcohol use: No    Alcohol/week: 0.0 standard drinks  . Drug use: No  . Sexual activity: Not on file  Other Topics Concern  . Not on file  Social History Narrative      Married 1 son one daughter, relocated to [place] after living in [place] for many years. She is a Writer of [employer]   No caffeine.          Lives at home with her husband and brother.   Right-handed.   No caffeine use.   Social Determinants of Health   Financial Resource Strain:   . Difficulty of Paying Living Expenses:   Food Insecurity: No Food Insecurity  . Worried About Charity fundraiser in the Last Year: Never true  . Ran Out of Food in the Last Year: Never true  Transportation Needs: No Transportation Needs  . Lack of Transportation (Medical): No  . Lack of Transportation (Non-Medical): No  Physical Activity:   . Days of Exercise per Week:   . Minutes of  Exercise per Session:   Stress:   . Feeling of Stress :   Social Connections:   . Frequency of Communication with Friends and Family:   . Frequency of Social Gatherings with Friends and Family:   . Attends Religious Services:   . Active Member of Clubs or Organizations:   . Attends Archivist Meetings:   Marland Kitchen Marital Status:   Intimate Partner Violence:   . Fear of Current or Ex-Partner:   . Emotionally Abused:   Marland Kitchen Physically Abused:   . Sexually Abused:       PHYSICAL EXAM  There were no vitals filed for this visit. There is no height or weight on file to calculate BMI.  Generalized: Well developed, in no acute distress   Neurological examination  Mentation: Alert oriented to time, place, history taking. Follows all commands speech and language fluent Cranial nerve II-XII: Pupils were equal round reactive to light. Extraocular movements were full, visual field were full on confrontational test. Facial sensation and strength were normal. Uvula tongue midline. Head turning and shoulder shrug  were normal and symmetric. Motor: The motor testing reveals 5  over 5 strength of all 4 extremities. Good symmetric motor tone is noted throughout.  Sensory: Sensory testing is intact to soft touch on all 4 extremities. No evidence of extinction is noted.  Coordination: Cerebellar testing reveals good finger-nose-finger and heel-to-shin bilaterally.  Gait and station: Gait is normal. Tandem gait is normal. Romberg is negative. No drift is seen.  Reflexes: Deep tendon reflexes are symmetric and normal bilaterally.   DIAGNOSTIC DATA (LABS, IMAGING, TESTING) - I reviewed patient records, labs, notes, testing and imaging myself where available.  Lab Results  Component Value Date   WBC 2.9 (L) 02/13/2020   HGB 13.5 02/13/2020   HCT 39.8 02/13/2020   MCV 92.0 02/13/2020   PLT 213.0 02/13/2020      Component Value Date/Time   NA 137 02/13/2020 0901   NA 140 04/12/2017 0000   K 4.7  02/13/2020 0901   CL 101 02/13/2020 0901   CO2 30 02/13/2020 0901   GLUCOSE 141 (H) 02/13/2020 0901   BUN 12 02/13/2020 0901   BUN 11 04/12/2017 0000   CREATININE 0.87 02/13/2020 0901   CALCIUM 10.0 02/13/2020 0901   PROT 7.0 02/13/2020 0901   ALBUMIN 4.2 02/13/2020 0901   AST 15 02/13/2020 0901   ALT 15 02/13/2020 0901   ALKPHOS 52 02/13/2020 0901   BILITOT 0.4 02/13/2020 0901   Lab Results  Component Value Date   CHOL 159 02/13/2020   HDL 58.50 02/13/2020   LDLCALC 78 02/13/2020   TRIG 111.0 02/13/2020   CHOLHDL 3 02/13/2020   Lab Results  Component Value Date   HGBA1C 6.7 05/13/2020   No results found for: VITAMINB12 Lab Results  Component Value Date   TSH 2.59 02/13/2020      ASSESSMENT AND PLAN 74 y.o. year old female  has a past medical history of Acid reflux, Allergic rhinitis, Allergy, Anemia, Arthritis, Asthma, Bilateral sciatica, Cataract, Chronic bilateral low back pain, Colon polyps, Diverticulosis, DM type 2 (diabetes mellitus, type 2) ([place]), Environmental allergies, GERD (gastroesophageal reflux disease), Hyperlipidemia, Low back pain, Migraine, Osteoporosis, and PONV (postoperative nausea and vomiting). here with ***   I spent 15 minutes with the patient. 50% of this time was spent   Butler Denmark, [place], DNP 07/21/2020, 5:56 AM [facility] [address], [place], [place] [address] [phone]

## 2020-07-28 ENCOUNTER — Ambulatory Visit (INDEPENDENT_AMBULATORY_CARE_PROVIDER_SITE_OTHER): Payer: Medicare Other

## 2020-07-28 DIAGNOSIS — J309 Allergic rhinitis, unspecified: Secondary | ICD-10-CM

## 2020-08-01 ENCOUNTER — Other Ambulatory Visit: Payer: Self-pay | Admitting: Neurology

## 2020-08-04 ENCOUNTER — Ambulatory Visit (INDEPENDENT_AMBULATORY_CARE_PROVIDER_SITE_OTHER): Payer: Medicare Other

## 2020-08-04 DIAGNOSIS — J309 Allergic rhinitis, unspecified: Secondary | ICD-10-CM | POA: Diagnosis not present

## 2020-08-11 ENCOUNTER — Ambulatory Visit (INDEPENDENT_AMBULATORY_CARE_PROVIDER_SITE_OTHER): Payer: Medicare Other

## 2020-08-11 DIAGNOSIS — J309 Allergic rhinitis, unspecified: Secondary | ICD-10-CM | POA: Diagnosis not present

## 2020-08-18 ENCOUNTER — Ambulatory Visit (INDEPENDENT_AMBULATORY_CARE_PROVIDER_SITE_OTHER): Payer: Medicare Other

## 2020-08-18 DIAGNOSIS — J309 Allergic rhinitis, unspecified: Secondary | ICD-10-CM | POA: Diagnosis not present

## 2020-08-20 ENCOUNTER — Encounter: Payer: Self-pay | Admitting: Adult Health

## 2020-08-25 ENCOUNTER — Ambulatory Visit (INDEPENDENT_AMBULATORY_CARE_PROVIDER_SITE_OTHER): Payer: Medicare Other

## 2020-08-25 DIAGNOSIS — J309 Allergic rhinitis, unspecified: Secondary | ICD-10-CM

## 2020-09-03 ENCOUNTER — Ambulatory Visit (INDEPENDENT_AMBULATORY_CARE_PROVIDER_SITE_OTHER): Payer: Medicare Other

## 2020-09-03 ENCOUNTER — Other Ambulatory Visit: Payer: Self-pay

## 2020-09-03 DIAGNOSIS — Z0000 Encounter for general adult medical examination without abnormal findings: Secondary | ICD-10-CM

## 2020-09-03 NOTE — Patient Instructions (Signed)
Kimberly Lam , Thank you for taking time to come for your Medicare Wellness Visit. I appreciate your ongoing commitment to your health goals. Please review the following plan we discussed and let me know if I can assist you in the future.   Screening recommendations/referrals: Colonoscopy: No longer required Mammogram: Up to date, next due 04/27/2021 Bone Density: No longer required Recommended yearly ophthalmology/optometry visit for glaucoma screening and checkup Recommended yearly dental visit for hygiene and checkup  Vaccinations: Influenza vaccine: Patient declined  Pneumococcal vaccine: Patient declined Pneumovax 23 Tdap vaccine: Currently due, you may receive at your next office visit Shingles vaccine: Currently due for Shingrix, if you would like to receive please discuss with your pharmacy and receive vaccine at your pharmacy    Advanced directives: Advance directive discussed with you today. Even though you declined this today please call our office should you change your mind and we can give you the proper paperwork for you to fill out.   Conditions/risks identified: None  Next appointment: 09/06/2021 @ 2:00 PM with Nurse [place] for Medicare Wellness   Preventive Care 73 Years and Older, Female Preventive care refers to lifestyle choices and visits with your health care provider that can promote health and wellness. What does preventive care include?  A yearly physical exam. This is also called an annual well check.  Dental exams once or twice a year.  Routine eye exams. Ask your health care provider how often you should have your eyes checked.  Personal lifestyle choices, including:  Daily care of your teeth and gums.  Regular physical activity.  Eating a healthy diet.  Avoiding tobacco and drug use.  Limiting alcohol use.  Practicing safe sex.  Taking low-dose aspirin every day.  Taking vitamin and mineral supplements as recommended by your  health care provider. What happens during an annual well check? The services and screenings done by your health care provider during your annual well check will depend on your age, overall health, lifestyle risk factors, and family history of disease. Counseling  Your health care provider may ask you questions about your:  Alcohol use.  Tobacco use.  Drug use.  Emotional well-being.  Home and relationship well-being.  Sexual activity.  Eating habits.  History of falls.  Memory and ability to understand (cognition).  Work and work Statistician.  Reproductive health. Screening  You may have the following tests or measurements:  Height, weight, and BMI.  Blood pressure.  Lipid and cholesterol levels. These may be checked every 5 years, or more frequently if you are over 24 years old.  Skin check.  Lung cancer screening. You may have this screening every year starting at age 73 if you have a 30-pack-year history of smoking and currently smoke or have quit within the past 15 years.  Fecal occult blood test (FOBT) of the stool. You may have this test every year starting at age 73.  Flexible sigmoidoscopy or colonoscopy. You may have a sigmoidoscopy every 5 years or a colonoscopy every 10 years starting at age 73.  Hepatitis C blood test.  Hepatitis B blood test.  Sexually transmitted disease (STD) testing.  Diabetes screening. This is done by checking your blood sugar (glucose) after you have not eaten for a while (fasting). You may have this done every 1-3 years.  Bone density scan. This is done to screen for osteoporosis. You may have this done starting at age 70.  Mammogram. This may be done every 1-2 years. Talk  to your health care provider about how often you should have regular mammograms. Talk with your health care provider about your test results, treatment options, and if necessary, the need for more tests. Vaccines  Your health care provider may recommend  certain vaccines, such as:  Influenza vaccine. This is recommended every year.  Tetanus, diphtheria, and acellular pertussis (Tdap, Td) vaccine. You may need a Td booster every 10 years.  Zoster vaccine. You may need this after age 1.  Pneumococcal 13-valent conjugate (PCV13) vaccine. One dose is recommended after age 45.  Pneumococcal polysaccharide (PPSV23) vaccine. One dose is recommended after age 71. Talk to your health care provider about which screenings and vaccines you need and how often you need them. This information is not intended to replace advice given to you by your health care provider. Make sure you discuss any questions you have with your health care provider. Document Released: 01/01/2016 Document Revised: 08/24/2016 Document Reviewed: 10/06/2015 Elsevier Interactive Patient Education  2017 [employer] Prevention in the Home Falls can cause injuries. They can happen to people of all ages. There are many things you can do to make your home safe and to help prevent falls. What can I do on the outside of my home?  Regularly fix the edges of walkways and driveways and fix any cracks.  Remove anything that might make you trip as you walk through a door, such as a raised step or threshold.  Trim any bushes or trees on the path to your home.  Use bright outdoor lighting.  Clear any walking paths of anything that might make someone trip, such as rocks or tools.  Regularly check to see if handrails are loose or broken. Make sure that both sides of any steps have handrails.  Any raised decks and porches should have guardrails on the edges.  Have any leaves, snow, or ice cleared regularly.  Use sand or salt on walking paths during winter.  Clean up any spills in your garage right away. This includes oil or grease spills. What can I do in the bathroom?  Use night lights.  Install grab bars by the toilet and in the tub and shower. Do not use towel bars as  grab bars.  Use non-skid mats or decals in the tub or shower.  If you need to sit down in the shower, use a plastic, non-slip stool.  Keep the floor dry. Clean up any water that spills on the floor as soon as it happens.  Remove soap buildup in the tub or shower regularly.  Attach bath mats securely with double-sided non-slip rug tape.  Do not have throw rugs and other things on the floor that can make you trip. What can I do in the bedroom?  Use night lights.  Make sure that you have a light by your bed that is easy to reach.  Do not use any sheets or blankets that are too big for your bed. They should not hang down onto the floor.  Have a firm chair that has side arms. You can use this for support while you get dressed.  Do not have throw rugs and other things on the floor that can make you trip. What can I do in the kitchen?  Clean up any spills right away.  Avoid walking on wet floors.  Keep items that you use a lot in easy-to-reach places.  If you need to reach something above you, use a strong step stool that  has a grab bar.  Keep electrical cords out of the [place].  Do not use floor polish or wax that makes floors slippery. If you must use wax, use non-skid floor wax.  Do not have throw rugs and other things on the floor that can make you trip. What can I do with my stairs?  Do not leave any items on the stairs.  Make sure that there are handrails on both sides of the stairs and use them. Fix handrails that are broken or loose. Make sure that handrails are as long as the stairways.  Check any carpeting to make sure that it is firmly attached to the stairs. Fix any carpet that is loose or worn.  Avoid having throw rugs at the top or bottom of the stairs. If you do have throw rugs, attach them to the floor with carpet tape.  Make sure that you have a light switch at the top of the stairs and the bottom of the stairs. If you do not have them, ask someone to add them  for you. What else can I do to help prevent falls?  Wear shoes that:  Do not have high heels.  Have rubber bottoms.  Are comfortable and fit you well.  Are closed at the toe. Do not wear sandals.  If you use a stepladder:  Make sure that it is fully opened. Do not climb a closed stepladder.  Make sure that both sides of the stepladder are locked into place.  Ask someone to hold it for you, if possible.  Clearly mark and make sure that you can see:  Any grab bars or handrails.  First and last steps.  Where the edge of each step is.  Use tools that help you move around (mobility aids) if they are needed. These include:  Canes.  Walkers.  Scooters.  Crutches.  Turn on the lights when you go into a dark area. Replace any light bulbs as soon as they burn out.  Set up your furniture so you have a clear path. Avoid moving your furniture around.  If any of your floors are uneven, fix them.  If there are any pets around you, be aware of where they are.  Review your medicines with your doctor. Some medicines can make you feel dizzy. This can increase your chance of falling. Ask your doctor what other things that you can do to help prevent falls. This information is not intended to replace advice given to you by your health care provider. Make sure you discuss any questions you have with your health care provider. Document Released: 10/01/2009 Document Revised: 05/12/2016 Document Reviewed: 01/09/2015 Elsevier Interactive Patient Education  2017 [employer].

## 2020-09-03 NOTE — Progress Notes (Signed)
Subjective:   Kimberly Lam is a 74 y.o. female who presents for Medicare Annual (Subsequent) preventive examination.  I connected with Kimberly Lam today by telephone and verified that I am speaking with the correct person using two identifiers. Location patient: home Location provider: work Persons participating in the virtual visit: patient, provider.   I discussed the limitations, risks, security and privacy concerns of performing an evaluation and management service by telephone and the availability of in person appointments. I also discussed with the patient that there may be a patient responsible charge related to this service. The patient expressed understanding and verbally consented to this telephonic visit.    Interactive audio and video telecommunications were attempted between this provider and patient, however failed, due to patient having technical difficulties OR patient did not have access to video capability.  We continued and completed visit with audio only.     Review of Systems    N/A Cardiac Risk Factors include: advanced age (>98men, >43 women);diabetes mellitus;hypertension;dyslipidemia     Objective:    Today's Vitals   There is no height or weight on file to calculate BMI.  Advanced Directives 09/03/2020 07/13/2020 01/14/2020 04/11/2019 03/12/2019 09/21/2015  Does Patient Have a Medical Advance Directive? No No No No No No  Does patient want to make changes to medical advance directive? - - No - Patient declined - - -  Would patient like information on creating a medical advance directive? No - Patient declined No - Patient declined - No - Guardian declined No - Patient declined -    Current Medications (verified) Outpatient Encounter Medications as of 09/03/2020  Medication Sig  . Alcohol Swabs (B-D SINGLE USE SWABS REGULAR) PADS USE TO TEST BLOOD GLUCOSE FOUR TIMES DAILY  . alendronate (FOSAMAX) 70 MG tablet TAKE 1 TABLET BY MOUTH 1  TIME A WEEK. TAKE  WITH A  FULL GLASS OF WATER ON AN  EMPTY STOMACH.  Marland Kitchen Ascorbic Acid (VITAMIN C) 1000 MG tablet Take 1,000 mg by mouth as needed.  . Azelastine HCl 0.15 % SOLN Use 1-2 sprays per nostril twice a day as needed for sinus drainage.  Jolyne Loa Grape-Goldenseal (BERBERINE COMPLEX PO) Take by mouth daily.  . Calcium Carbonate-Vitamin D (CALCIUM 600+D PO) Take by mouth.  . cholecalciferol (VITAMIN D) 1000 units tablet Take 1,000 Units by mouth daily.  Marland Kitchen CINNAMON PO Take 1 tablet by mouth as needed.  . Cranberry 450 MG CAPS Take 1 capsule by mouth as needed.  . Cyanocobalamin (VITAMIN B-12 CR) 1500 MCG TBCR Take 1 tablet by mouth as needed.  . Echinacea-Goldenseal (ECHINACEA COMB/GOLDEN SEAL PO) Take 1 tablet by mouth as needed. Reported on 12/16/2015  . EPINEPHrine (EPIPEN 2-PAK) 0.3 mg/0.3 mL IJ SOAJ injection Inject 0.3 mg into the muscle as needed for anaphylaxis.  . fluticasone (FLONASE) 50 MCG/ACT nasal spray Place 1 spray into both nostrils 2 (two) times daily.  Marland Kitchen gabapentin (NEURONTIN) 300 MG capsule Take 2 capsules (600 mg total) by mouth 3 (three) times daily.  Nyoka Cowden Tea, Camillia sinensis, (GREEN TEA PO) Take by mouth as needed. Reported on 12/16/2015  . ipratropium (ATROVENT) 0.06 % nasal spray USE 2 SPRAYS IN BOTH  NOSTRILS 3 TIMES DAILY AS  NEEDED FOR RHINITIS  . Lactobacillus (ACIDOPHILUS PROBIOTIC PO) Take 1 tablet by mouth daily.  . Lancets (ONETOUCH DELICA PLUS [id number]) MISC 1 each by Does not apply route in the morning, at noon, in the evening, and at bedtime.  Marland Kitchen  levocetirizine (XYZAL) 5 MG tablet TAKE 1 TABLET BY MOUTH  EVERY EVENING  . MELATONIN ER PO Take by mouth.  . meloxicam (MOBIC) 7.5 MG tablet Take 1 tablet (7.5 mg total) by mouth daily.  . metFORMIN (GLUCOPHAGE-XR) 500 MG 24 hr tablet TAKE 2 TABLETS BY MOUTH  TWICE DAILY  . montelukast (SINGULAIR) 10 MG tablet TAKE 1 TABLET BY MOUTH AT  BEDTIME  . Omega-3 Fatty Acids (OMEGA 3 PO) Take 1 tablet by mouth as needed.    Marland Kitchen omeprazole (PRILOSEC) 40 MG capsule Take 40 mg by mouth daily.  Glory Rosebush VERIO test strip USE TO TEST BLOOD GLUCOSE FOUR TIMES DAILY  . Polyethyl Glycol-Propyl Glycol (SYSTANE FREE OP) Apply 1 drop to eye daily. Uses thera work for dr eyes per pt  . POTASSIUM PO Take by mouth.  . ramipril (ALTACE) 2.5 MG capsule TAKE 1 CAPSULE BY MOUTH  DAILY  . simvastatin (ZOCOR) 20 MG tablet TAKE 1 TABLET BY MOUTH  DAILY  . Spacer/Aero-Holding Chambers (AEROCHAMBER PLUS) inhaler Use as instructed with MDI  . Stinging Nettle 2 % POWD Take 1 tablet by mouth as needed. Reported on 12/16/2015  . traZODone (DESYREL) 50 MG tablet TAKE 1/2 TO 1 TABLET BY  MOUTH AT BEDTIME AS NEEDED. FOR SLEEP  . TURMERIC PO Take 800 mg by mouth as needed. Reported on 12/16/2015  . VENTOLIN HFA 108 (90 Base) MCG/ACT inhaler Inhale 2 puffs into the lungs every 6 (six) hours as needed for wheezing or shortness of breath.   No facility-administered encounter medications on file as of 09/03/2020.    Allergies (verified) Peanut-containing drug products, Eggs or egg-derived products, Milk-related compounds, Actos [pioglitazone], Soy allergy, and Vicodin [hydrocodone-acetaminophen]   History: Past Medical History:  Diagnosis Date  . Acid reflux   . Allergic rhinitis   . Allergy   . Anemia   . Arthritis    per MD- pt states no s/s of this   . Asthma   . Bilateral sciatica   . Cataract    removed both eyes   . Chronic bilateral low back pain   . Colon polyps   . Diverticulosis   . DM type 2 (diabetes mellitus, type 2) ([place])   . Environmental allergies    flowers, pollen, trees  . GERD (gastroesophageal reflux disease)   . Hyperlipidemia   . Low back pain   . Migraine    past hx- control as allergies are controlled   . Osteoporosis   . PONV (postoperative nausea and vomiting)    Past Surgical History:  Procedure Laterality Date  . CATARACT EXTRACTION, BILATERAL Bilateral 2012  . CESAREAN SECTION     x 2  .  COLONOSCOPY     > 10 yrs ago- unsure if removed polyps as was told sev.different things after this colon   . DENTAL SURGERY     molars removed   . DILATION AND CURETTAGE OF UTERUS    . NASAL SINUS SURGERY    . TUBAL LIGATION     Family History  Problem Relation Age of Onset  . Asthma Father   . Emphysema Father   . Alcohol abuse Father   . Diabetes Mellitus II Brother        x 2  . Hyperlipidemia Brother   . Hypertension Brother   . Pancreatic cancer Brother   . Prostate cancer Brother   . Diabetes Brother   . Heart disease Maternal Grandmother  hardening of the arteries  . Heart murmur Daughter   . Glaucoma Brother   . Breast cancer Neg Hx   . Colon cancer Neg Hx   . Colon polyps Neg Hx   . Esophageal cancer Neg Hx   . Rectal cancer Neg Hx   . Stomach cancer Neg Hx    Social History   Socioeconomic History  . Marital status: Married    Spouse name: Not on file  . Number of children: 2  . Years of education: 72  . Highest education level: Not on file  Occupational History  . Occupation: Retired  Tobacco Use  . Smoking status: Never Smoker  . Smokeless tobacco: Never Used  Vaping Use  . Vaping Use: Never used  Substance and Sexual Activity  . Alcohol use: No    Alcohol/week: 0.0 standard drinks  . Drug use: No  . Sexual activity: Not on file  Other Topics Concern  . Not on file  Social History Narrative      Married 1 son one daughter, relocated to [place] after living in [place] for many years. She is a Writer of [employer].   No caffeine.          Lives at home with her husband and brother.   Right-handed.   No caffeine use.   Social Determinants of Health   Financial Resource Strain: Low Risk   . Difficulty of Paying Living Expenses: Not hard at all  Food Insecurity: No Food Insecurity  . Worried About Charity fundraiser in the Last Year: Never true  . Ran Out of Food in the Last Year: Never true  Transportation  Needs: No Transportation Needs  . Lack of Transportation (Medical): No  . Lack of Transportation (Non-Medical): No  Physical Activity: Insufficiently Active  . Days of Exercise per Week: 3 days  . Minutes of Exercise per Session: 30 min  Stress: No Stress Concern Present  . Feeling of Stress : Not at all  Social Connections: Moderately Integrated  . Frequency of Communication with Friends and Family: More than three times a week  . Frequency of Social Gatherings with Friends and Family: Once a week  . Attends Religious Services: More than 4 times per year  . Active Member of Clubs or Organizations: No  . Attends Archivist Meetings: Never  . Marital Status: Married    Tobacco Counseling Counseling given: Not Answered   Clinical Intake:  Pre-visit preparation completed: Yes  Pain : No/denies pain     Nutritional Risks: None Diabetes: Yes (Patient states checks glucose daily 3-4x per day) CBG done?: No Did pt. bring in CBG monitor from home?: No  How often do you need to have someone help you when you read instructions, pamphlets, or other written materials from your doctor or pharmacy?: 1 - Never What is the last grade level you completed in school?: College  Diabetic?yes  Interpreter Needed?: No  Information entered by :: [place] of Daily Living In your present state of health, do you have any difficulty performing the following activities: 09/03/2020 01/14/2020  Hearing? Tempie Donning  Comment has had hearing test and does need hearing aids sometimes sounds muffled  Vision? N N  Difficulty concentrating or making decisions? N N  Walking or climbing stairs? N Y  Comment - pain in knees when she hasto climb alot of stairs  Dressing or bathing? N N  Doing errands, shopping? N  N  Preparing Food and eating ? N N  Using the Toilet? N N  In the past six months, have you accidently leaked urine? N N  Do you have problems with loss of bowel control? N N    Managing your Medications? N N  Managing your Finances? N N  Housekeeping or managing your Housekeeping? N N  Some recent data might be hidden    Patient Care Team: [name], NP as PCP - General (Family Medicine) [place], Eppie Gibson, RN as [place] any recent [place] you may have received from other than [facility] providers in the past year (date may be approximate).     Assessment:   This is a routine wellness examination for Maren.  Hearing/Vision screen  Hearing Screening   '125Hz'$  $[place]'250Hz'Yu[place]d$'500Hz'$'1000Hz'$'2000Hz'$'3000Hz'$'4000Hz'$'6000Hz'$'8000Hz'$   Right ear:           Left ear:           Vision Screening Comments: Patient states gets eye examined yearly    Dietary issues and exercise activities discussed: Current Exercise Habits: Home exercise routine, Type of exercise: walking, Time (Minutes): 30, Frequency (Times/Week): 3, Weekly Exercise (Minutes/Week): 90, Intensity: Mild, Exercise limited by: None identified  Goals    .  Diabetes Patient stated goal (pt-stated)      CARE PLAN ENTRY (see longtitudinal plan of care for additional care plan information)  Objective:  Lab Results  Component Value Date   HGBA1C 6.7 05/13/2020 .   Lab Results  Component Value Date   CREATININE 0.87 02/13/2020   CREATININE 0.70 10/22/2018   CREATININE 0.89 10/18/2018 .   Marland Kitchen No results found for: EGFR  Current Barriers:  Marland Kitchen Knowledge Deficits related to basic Diabetes pathophysiology and self care/management  Case Manager Clinical Goal(s):  Over the next 90 days, patient will demonstrate improved adherence to prescribed treatment plan for diabetes self care/management as evidenced by: Maintaining A1C at 6.7 or < . Verbalize daily monitoring and recording of CBG within 90 days . Verbalize adherence to ADA/ carb modified diet within the next 90 days . Verbalize exercise 3 days/week . Verbalize adherence to prescribed medication regimen within the  next 90 days   Interventions:  . Provided education to patient about basic DM disease process . Discussed plans with patient for ongoing care management follow up and provided patient with direct contact information for care management team . Advised patient on obtaining a medical alert system . Provided patient with educational material on Healthy Meal Planning  Patient Self Care Activities:  . Self administers oral medications as prescribed . Attends all scheduled provider appointments . Checks blood sugars as prescribed and utilize hyper and hypoglycemia protocol as needed . Adheres to prescribed ADA/carb modified . Call for Medical alert system  [place] will outreach within the month of October      .  Exercise 150 min/wk Moderate Activity      Depression Screen PHQ 2/9 Scores 09/03/2020 01/14/2020 04/11/2019 03/12/2019 10/18/2018 06/13/2017  PHQ - 2 Score 0 0 0 0 0 0  PHQ- 9 Score 0 - - - - -    Fall Risk Fall Risk  09/03/2020 01/14/2020 08/16/2019 05/16/2019 04/11/2019  Falls in the past year? 1 0 0 0 0  Number falls in past yr: 0 0 - - -  Injury with Fall? 0 0 - - -  Risk for fall due to : History of fall(s) - - - -  Follow up Falls evaluation completed;Falls prevention discussed Falls evaluation completed - - -    Any stairs in or around the home? Yes  If so, are there any without handrails? No  Home free of loose throw rugs in walkways, pet beds, electrical cords, etc? Yes  Adequate lighting in your home to reduce risk of falls? Yes   ASSISTIVE DEVICES UTILIZED TO PREVENT FALLS:  Life alert? No  Use of a cane, walker or w/c? No  Grab bars in the bathroom? Yes  Shower chair or bench in shower? No  Elevated toilet seat or a handicapped toilet? No     Cognitive Function:  Cognitive screening not indicated based on direct observation      Immunizations Immunization History  Administered Date(s) Administered  . PFIZER SARS-COV-2 Vaccination 04/30/2020,  05/25/2020  . PPD Test 05/13/2009, 05/27/2009, 05/06/2011  . Pneumococcal Conjugate-13 01/27/2014    TDAP status: Due, Education has been provided regarding the importance of this vaccine. Advised may receive this vaccine at local pharmacy or Health Dept. Aware to provide a copy of the vaccination record if obtained from local pharmacy or Health Dept. Verbalized acceptance and understanding. Flu Vaccine status: Declined, Education has been provided regarding the importance of this vaccine but patient still declined. Advised may receive this vaccine at local pharmacy or Health Dept. Aware to provide a copy of the vaccination record if obtained from local pharmacy or Health Dept. Verbalized acceptance and understanding. Pneumococcal vaccine status: Declined,  Education has been provided regarding the importance of this vaccine but patient still declined. Advised may receive this vaccine at local pharmacy or Health Dept. Aware to provide a copy of the vaccination record if obtained from local pharmacy or Health Dept. Verbalized acceptance and understanding.  Covid-19 vaccine status: Completed vaccines  Qualifies for Shingles Vaccine? Yes   Zostavax completed No   Shingrix Completed?: No.    Education has been provided regarding the importance of this vaccine. Patient has been advised to call insurance company to determine out of pocket expense if they have not yet received this vaccine. Advised may also receive vaccine at local pharmacy or Health Dept. Verbalized acceptance and understanding.  Screening Tests Health Maintenance  Topic Date Due  . FOOT EXAM  10/19/2019  . INFLUENZA VACCINE  Never done  . OPHTHALMOLOGY EXAM  08/11/2020  . TETANUS/TDAP  02/11/2021 (Originally 09/02/1965)  . PNA vac Low Risk Adult (2 of 2 - PPSV23) 02/11/2021 (Originally 01/27/2015)  . HEMOGLOBIN A1C  11/13/2020  . MAMMOGRAM  04/27/2022  . COLONOSCOPY  03/09/2030  . DEXA SCAN  Completed  . COVID-19 Vaccine  Completed   . Hepatitis C Screening  Completed    Health Maintenance  Health Maintenance Due  Topic Date Due  . FOOT EXAM  10/19/2019  . INFLUENZA VACCINE  Never done  . OPHTHALMOLOGY EXAM  08/11/2020    Colorectal cancer screening: No longer required.  Mammogram status: Completed 04/27/2020. Repeat every year Bone Density status: Completed 04/27/2020. Results reflect: Bone density results: OSTEOPENIA. Repeat every 2 years.  Lung Cancer Screening: (Low Dose CT Chest recommended if Age 33-80 years, 30 pack-year currently smoking OR have quit w/in 15years.) does not qualify.   Lung Cancer Screening Referral: N/A  Additional Screening:  Hepatitis C Screening: does qualify; Completed 04/27/2020  Vision Screening: Recommended annual ophthalmology exams for early detection of glaucoma and other disorders of the eye. Is the patient up to date with their annual eye exam?  Yes  Who is  the provider or what is the name of the office in which the patient attends annual eye exams? [facility] If pt is not established with a provider, would they like to be referred to a provider to establish care? No .   Dental Screening: Recommended annual dental exams for proper oral hygiene  Community Resource Referral / Chronic Care Management: CRR required this visit?  No   CCM required this visit?  No      Plan:     I have personally reviewed and noted the following in the patient's chart:   . Medical and social history . Use of alcohol, tobacco or illicit drugs  . Current medications and supplements . Functional ability and status . Nutritional status . Physical activity . Advanced directives . List of other physicians . Hospitalizations, surgeries, and ER visits in previous 12 months . Vitals . Screenings to include cognitive, depression, and falls . Referrals and appointments  In addition, I have reviewed and discussed with patient certain preventive protocols, quality metrics, and  best practice recommendations. A written personalized care plan for preventive services as well as general preventive health recommendations were provided to patient.     Ofilia Neas, LPN   9/75/3005   Nurse Notes: None

## 2020-09-04 DIAGNOSIS — H16223 Keratoconjunctivitis sicca, not specified as Sjogren's, bilateral: Secondary | ICD-10-CM | POA: Diagnosis not present

## 2020-09-04 DIAGNOSIS — E119 Type 2 diabetes mellitus without complications: Secondary | ICD-10-CM | POA: Diagnosis not present

## 2020-09-04 DIAGNOSIS — H33311 Horseshoe tear of retina without detachment, right eye: Secondary | ICD-10-CM | POA: Diagnosis not present

## 2020-09-04 LAB — HM DIABETES EYE EXAM

## 2020-09-06 NOTE — Progress Notes (Signed)
Follow Up Note  RE: Kimberly Lam MRN: [id number] DOB: 28-Dec-1945 Date of Office Visit: 09/07/2020  Referring provider: [name], NP Primary care provider: [name], NP  Chief Complaint: Asthma  History of Present Illness: I had the pleasure of seeing Kimberly Lam for a follow up visit at the [facility] [place] on 09/07/2020. She is a 74 y.o. female, who is being followed for asthma, allergic rhinoconjunctivitis on AIT, GERD, nasal septal perforation and food allergy. Her previous allergy office visit was on 03/04/2020 with Dr. Maudie Mercury. Today is a regular follow up visit.  Moderate persistent asthma is reported as controlled with montelukast 10 mg once a day and albuterol as needed.  She reports that she has not had to use her albuterol any since her last office visit.  She denies any coughing, wheezing, tightness in chest, shortness of breath, and nocturnal awakenings.  Since her last office visit she has not required any systemic steroids due to breathing problems or made any trips to the emergency room or urgent care.  She did mention that in the past when she has used inhalers in the past that it did cause her mouth to get sores and become white.  She is uncertain if her albuterol inhaler causes this, because she has not used her albuterol.  Perennial and seasonal allergic rhinitis is reported as moderately controlled with allergy injections every 2 weeks, fluticasone nasal spray, ipratropium nasal spray, montelukast 10 mg once a day, saline rinse, and over-the-counter nettle.  She reports occasional clear rhinorrhea, nasal congestion, postnasal drip, and itchy dry eyes.  She reports that the over-the-counter nettle that she takes helps with her clear rhinorrhea.  She also uses Systane eyedrops which help with her itchy dry eyes.  She feels that her allergy injections help control her symptoms and she denies any large local reactions.  She did mention that she notices as  it gets closer in time for her to get her allergy injections she will start to get a headache.  Reflux is reported as controlled by her diet.  She has not had to take omeprazole 40 mg in a while.  She continues to avoid peanuts, cows milk and soy being without any accidental ingestion.  She did not ever pick up her EpiPen due to cost.  Current medications are as listed in the chart.  Assessment and Plan: Kimberly Lam is a 74 y.o. female with: Moderate persistent asthma, uncomplicated   Past history - Felt better with [facility] but could not afford to purchase after samples ran out. Reports that inhalers cause her mouth to get sores and become white Interim history - doing well with below regimen and has not had to use albuterol at all.   ACT score 25.   Today's spirometry was normal.  Continue Singulair 10mg  daily.  May use albuterol rescue inhaler 2 puffs or nebulizer every 4 to 6 hours as needed for shortness of breath, chest tightness, coughing, and wheezing. May use albuterol rescue inhaler 2 puffs 5 to 15 minutes prior to strenuous physical activities. Monitor frequency of use.  Perennial and seasonal allergic rhinoconjunctivitis Past history - allergy immunotherapy for tree/grass/weed/feather/cockroach/cat/dog/mold/dust mites since 2016. Interim history - stable, tried to increase time between injections but get headaches.   Continue environmental control measures.  Continue allergy injections every 2 weeks.   May use over the counter antihistamines such as Zyrtec (cetirizine), Claritin (loratadine), Allegra (fexofenadine) as needed in the morning.    Continue  Flonase 1-2 sprays twice a day.   Continue Atrovent 2 sprays in each nostril 3 times a day as needed for drainage.   Nasal saline spray (i.e., Simply Saline) or nasal saline lavage (i.e., NeilMed) is recommended as needed and prior to medicated nasal sprays.  Continue montelukast 10mg  daily.  Gastroesophageal reflux  disease Improved with better diet control.   Continue taking omeprazole 40mg  daily as needed.  Nasal septal perforation Saw ENT - no intervention for now.   Monitor symptoms.    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 Interim history - Patient did not pick up Epipen due to cost. No reactions since last visit.   Continue to avoid peanuts, cow's milk, soybean.  For mild symptoms you can take over the counter antihistamines such as Benadryl and monitor symptoms closely. If symptoms worsen or if you have severe symptoms including breathing issues, throat closure, significant swelling, whole body hives, severe diarrhea and vomiting, lightheadedness then seek immediate medical care.  Auvi Q 0.3 mg sample given with demonstration  Return in about 6 months (around 03/07/2021), or if symptoms worsen or fail to improve.  Diagnostics: Spirometry:  Tracings reviewed. Her effort: It was hard to get consistent efforts and there is a question as to whether this reflects a maximal maneuver. FVC: 2.09L FEV1: 1.79L, 101% predicted FEV1/FVC ratio: 86% Interpretation: Spirometry consistent with normal pattern.  Please see scanned spirometry results for details.  Skin Testing: none completed today  Medication List:  Current Outpatient Medications  Medication Sig Dispense Refill  . fluticasone (FLONASE) 50 MCG/ACT nasal spray Place 1 spray into both nostrils 2 (two) times daily. 48 g 1  . ipratropium (ATROVENT) 0.06 % nasal spray USE 2 SPRAYS IN BOTH  NOSTRILS 3 TIMES DAILY AS  NEEDED FOR RHINITIS 90 mL 0  . VENTOLIN HFA 108 (90 Base) MCG/ACT inhaler Inhale 2 puffs into the lungs every 6 (six) hours as needed for wheezing or shortness of breath.    . Alcohol Swabs (B-D SINGLE USE SWABS REGULAR) PADS USE TO TEST BLOOD GLUCOSE FOUR TIMES DAILY 200 each 3  . alendronate (FOSAMAX) 70 MG tablet TAKE 1 TABLET BY MOUTH 1  TIME A WEEK. TAKE WITH  A  FULL GLASS OF WATER ON AN  EMPTY STOMACH. 12 tablet 3  . Ascorbic Acid (VITAMIN C) 1000 MG tablet Take 1,000 mg by mouth as needed.    . Azelastine HCl 0.15 % SOLN Use 1-2 sprays per nostril twice a day as needed for sinus drainage. 30 mL 5  . Barberry-Oreg Grape-Goldenseal (BERBERINE COMPLEX PO) Take by mouth daily.    . Calcium Carbonate-Vitamin D (CALCIUM 600+D PO) Take by mouth.    . cholecalciferol (VITAMIN D) 1000 units tablet Take 1,000 Units by mouth daily.    Marland Kitchen CINNAMON PO Take 1 tablet by mouth as needed.    . Cranberry 450 MG CAPS Take 1 capsule by mouth as needed.    . Cyanocobalamin (VITAMIN B-12 CR) 1500 MCG TBCR Take 1 tablet by mouth as needed.    . Echinacea-Goldenseal (ECHINACEA COMB/GOLDEN SEAL PO) Take 1 tablet by mouth as needed. Reported on 12/16/2015    . EPINEPHrine (EPIPEN 2-PAK) 0.3 mg/0.3 mL IJ SOAJ injection Inject 0.3 mg into the muscle as needed for anaphylaxis. (Patient not taking: Reported on 09/07/2020)    . gabapentin (NEURONTIN) 300 MG capsule Take 2 capsules (600 mg total) by mouth 3 (three) times daily. 540 capsule  0  . Green Tea, Camillia sinensis, (GREEN TEA PO) Take by mouth as needed. Reported on 12/16/2015    . Lactobacillus (ACIDOPHILUS PROBIOTIC PO) Take 1 tablet by mouth daily.    . Lancets (ONETOUCH DELICA PLUS [id number]) MISC 1 each by Does not apply route in the morning, at noon, in the evening, and at bedtime. 400 each 3  . levocetirizine (XYZAL) 5 MG tablet TAKE 1 TABLET BY MOUTH  EVERY EVENING 90 tablet 1  . MELATONIN ER PO Take by mouth.    . meloxicam (MOBIC) 7.5 MG tablet Take 1 tablet (7.5 mg total) by mouth daily. 30 tablet 1  . metFORMIN (GLUCOPHAGE-XR) 500 MG 24 hr tablet TAKE 2 TABLETS BY MOUTH  TWICE DAILY 360 tablet 3  . montelukast (SINGULAIR) 10 MG tablet TAKE 1 TABLET BY MOUTH AT  BEDTIME 90 tablet 1  . Omega-3 Fatty Acids (OMEGA 3 PO) Take 1 tablet by mouth as needed.    Marland Kitchen omeprazole (PRILOSEC) 40 MG capsule Take 40 mg by mouth  daily.    Glory Rosebush VERIO test strip USE TO TEST BLOOD GLUCOSE FOUR TIMES DAILY 400 each 3  . Polyethyl Glycol-Propyl Glycol (SYSTANE FREE OP) Apply 1 drop to eye daily. Uses thera work for dr eyes per pt    . POTASSIUM PO Take by mouth.    . ramipril (ALTACE) 2.5 MG capsule TAKE 1 CAPSULE BY MOUTH  DAILY 90 capsule 3  . simvastatin (ZOCOR) 20 MG tablet TAKE 1 TABLET BY MOUTH  DAILY 90 tablet 3  . Spacer/Aero-Holding Chambers (AEROCHAMBER PLUS) inhaler Use as instructed with MDI 1 each 2  . Stinging Nettle 2 % POWD Take 1 tablet by mouth as needed. Reported on 12/16/2015    . traZODone (DESYREL) 50 MG tablet TAKE 1/2 TO 1 TABLET BY  MOUTH AT BEDTIME AS NEEDED. FOR SLEEP 90 tablet 3  . TURMERIC PO Take 800 mg by mouth as needed. Reported on 12/16/2015     No current facility-administered medications for this visit.   Allergies: Allergies  Allergen Reactions  . Peanut-Containing Drug Products Anaphylaxis  . Eggs Or Egg-Derived Products Nausea Only    Nausea only- can eat cakes with eggs   . Milk-Related Compounds Nausea And Vomiting    Milk and cheese  . Actos [Pioglitazone] Nausea Only  . Soy Allergy Nausea Only    Pt states had allergy testing and was told allergic to soy-causes nausea but no other reactions  . Vicodin [Hydrocodone-Acetaminophen] Nausea Only   I reviewed her past medical history, social history, family history, and environmental history and no significant changes have been reported from her previous visit.  Review of Systems  Constitutional: Negative for appetite change, chills, fever and unexpected weight change.  HENT: Negative for congestion, postnasal drip and rhinorrhea.   Eyes: Negative for itching.  Respiratory: Negative for cough, chest tightness, shortness of breath and wheezing.   Cardiovascular: Negative for chest pain.  Gastrointestinal: Negative for abdominal pain.  Genitourinary: Negative for difficulty urinating and dysuria.  Skin: Negative for  rash.  Allergic/Immunologic: Positive for environmental allergies and food allergies.  Neurological: Negative for headaches.   Objective: BP (!) 102/54   Pulse 79   Temp 97.9 F (36.6 C) (Temporal)   Resp 16   Wt 131 lb 9.6 oz (59.7 kg)   SpO2 97%   BMI 22.59 kg/m  Body mass index is 22.59 kg/m. Physical Exam Vitals and nursing note reviewed.  Constitutional:  Appearance: Normal appearance. She is well-developed.  HENT:     Head: Normocephalic and atraumatic.     Comments: Pharynx normal. Eyes normal. Ears unable to visualize right tympanic membrane due to cerumen. Left ear normal. Nose: septal perforation noted    Right Ear: Ear canal and external ear normal.     Left Ear: Tympanic membrane, ear canal and external ear normal.     Mouth/Throat:     Mouth: Mucous membranes are moist.     Pharynx: Oropharynx is clear.  Eyes:     Conjunctiva/sclera: Conjunctivae normal.  Cardiovascular:     Rate and Rhythm: Normal rate and regular rhythm.     Heart sounds: Normal heart sounds. No murmur heard.  No friction rub. No gallop.   Pulmonary:     Effort: Pulmonary effort is normal.     Breath sounds: Normal breath sounds. No wheezing or rales.     Comments: Lungs clear to auscultation Musculoskeletal:     Cervical back: Neck supple.  Skin:    General: Skin is warm.     Findings: No rash.  Neurological:     Mental Status: She is alert and oriented to person, place, and time.  Psychiatric:        Mood and Affect: Mood normal.        Behavior: Behavior normal.        Thought Content: Thought content normal.        Judgment: Judgment normal.    Previous notes and tests were reviewed. The plan was reviewed with the patient/family, and all questions/concerned were addressed.  It was my pleasure to see Navjot today and participate in her care. Please feel free to contact me with any questions or concerns.  Thank you for the opportunity to care for this patient. Please let me  know if you have any questions.  Althea Charon, FNP [facility]   I performed a history and physical examination of the patient and discussed her management with the Nurse Practitioner. I reviewed the Nurse Practitioner's note and agree with the documented findings and plan of care. The note in its entirety was edited by myself, including the physical exam, assessment, and plan.   Rexene Alberts, DO [facility] [place] office: [phone] [facility] office: [place] office: [phone]

## 2020-09-07 ENCOUNTER — Other Ambulatory Visit: Payer: Self-pay

## 2020-09-07 ENCOUNTER — Ambulatory Visit (INDEPENDENT_AMBULATORY_CARE_PROVIDER_SITE_OTHER): Payer: Medicare Other | Admitting: Allergy

## 2020-09-07 ENCOUNTER — Ambulatory Visit: Payer: Self-pay

## 2020-09-07 ENCOUNTER — Encounter: Payer: Self-pay | Admitting: Allergy

## 2020-09-07 VITALS — BP 102/54 | HR 79 | Temp 97.9°F | Resp 16 | Wt 131.6 lb

## 2020-09-07 DIAGNOSIS — J3489 Other specified disorders of nose and nasal sinuses: Secondary | ICD-10-CM | POA: Diagnosis not present

## 2020-09-07 DIAGNOSIS — J4540 Moderate persistent asthma, uncomplicated: Secondary | ICD-10-CM

## 2020-09-07 DIAGNOSIS — J3089 Other allergic rhinitis: Secondary | ICD-10-CM

## 2020-09-07 DIAGNOSIS — T7800XD Anaphylactic reaction due to unspecified food, subsequent encounter: Secondary | ICD-10-CM | POA: Diagnosis not present

## 2020-09-07 DIAGNOSIS — J309 Allergic rhinitis, unspecified: Secondary | ICD-10-CM

## 2020-09-07 DIAGNOSIS — K219 Gastro-esophageal reflux disease without esophagitis: Secondary | ICD-10-CM

## 2020-09-07 NOTE — Assessment & Plan Note (Addendum)
Past history - soy caused increased rhinitis symptoms. Interim history - Patient did not pick up Epipen due to cost. No reactions since last visit.   Continue to avoid peanuts, cow's milk, soybean.  For mild symptoms you can take over the counter antihistamines such as Benadryl and monitor symptoms closely. If symptoms worsen or if you have severe symptoms including breathing issues, throat closure, significant swelling, whole body hives, severe diarrhea and vomiting, lightheadedness then seek immediate medical care.  Auvi Q 0.3 mg sample given with demonstration

## 2020-09-07 NOTE — Assessment & Plan Note (Addendum)
Saw ENT - no intervention for now.   Monitor symptoms.

## 2020-09-07 NOTE — Patient Instructions (Signed)
Perennial and seasonal allergic rhinoconjunctivitis  Continue environmental control measures.  Continue allergy injections every 2 weeks.   May use over the counter antihistamines such as Zyrtec (cetirizine), Claritin (loratadine), Allegra (fexofenadine) as needed in the morning.    Continue Flonase 1-2 sprays twice a day.   Continue Atrovent 2 sprays in each nostril 3 times a day as needed for drainage.   Nasal saline spray (i.e., Simply Saline) or nasal saline lavage (i.e., NeilMed) is recommended as needed and prior to medicated nasal sprays.  Continue montelukast 10mg  daily.  Moderate persistent asthma Daily controller medication(s):  Continue Singulair 10mg  daily.   May use albuterol rescue inhaler 2 puffs or nebulizer every 4 to 6 hours as needed for shortness of breath, chest tightness, coughing, and wheezing. May use albuterol rescue inhaler 2 puffs 5 to 15 minutes prior to strenuous physical activities. Monitor frequency of use.   Nasal septal perforation  Monitor symptoms.    Gastroesophageal reflux disease  Continue taking omeprazole 40mg  daily as needed.  Anaphylactic shock due to adverse food reaction  Continue to avoid peanuts, cow's milk, soybean.  For mild symptoms you can take over the counter antihistamines such as Benadryl and monitor symptoms closely. If symptoms worsen or if you have severe symptoms including breathing issues, throat closure, significant swelling, whole body hives, severe diarrhea and vomiting, lightheadedness then seek immediate medical care.  Follow up in 6 months or sooner if needed.

## 2020-09-07 NOTE — Assessment & Plan Note (Addendum)
Past history - allergy immunotherapy for tree/grass/weed/feather/cockroach/cat/dog/mold/dust mites since 2016. Interim history - stable, tried to increase time between injections but get headaches.   Continue environmental control measures.  Continue allergy injections every 2 weeks.   May use over the counter antihistamines such as Zyrtec (cetirizine), Claritin (loratadine), Allegra (fexofenadine) as needed in the morning.    Continue Flonase 1-2 sprays twice a day.   Continue Atrovent 2 sprays in each nostril 3 times a day as needed for drainage.   Nasal saline spray (i.e., Simply Saline) or nasal saline lavage (i.e., NeilMed) is recommended as needed and prior to medicated nasal sprays.  Continue montelukast 10mg  daily.

## 2020-09-07 NOTE — Assessment & Plan Note (Addendum)
Improved with better diet control.   Continue taking omeprazole 40mg  daily as needed.

## 2020-09-07 NOTE — Assessment & Plan Note (Signed)
Past history - Felt better with [facility] but could not afford to purchase after samples ran out. Reports that inhalers cause her mouth to get sores and become white Interim history - doing well with below regimen and has not had to use albuterol at all.   ACT score 25.   Today's spirometry was normal.  Continue Singulair 10mg  daily.  May use albuterol rescue inhaler 2 puffs or nebulizer every 4 to 6 hours as needed for shortness of breath, chest tightness, coughing, and wheezing. May use albuterol rescue inhaler 2 puffs 5 to 15 minutes prior to strenuous physical activities. Monitor frequency of use.

## 2020-09-09 ENCOUNTER — Encounter: Payer: Self-pay | Admitting: Adult Health

## 2020-09-17 ENCOUNTER — Other Ambulatory Visit: Payer: Self-pay | Admitting: Adult Health

## 2020-09-17 ENCOUNTER — Other Ambulatory Visit: Payer: Self-pay | Admitting: Family Medicine

## 2020-09-17 ENCOUNTER — Other Ambulatory Visit: Payer: Self-pay | Admitting: Neurology

## 2020-09-17 ENCOUNTER — Other Ambulatory Visit: Payer: Self-pay | Admitting: Allergy

## 2020-09-17 DIAGNOSIS — J309 Allergic rhinitis, unspecified: Secondary | ICD-10-CM

## 2020-09-18 NOTE — Telephone Encounter (Signed)
Called patient to inform her that her refills have been sent.

## 2020-09-20 ENCOUNTER — Other Ambulatory Visit: Payer: Self-pay | Admitting: Adult Health

## 2020-09-20 ENCOUNTER — Other Ambulatory Visit: Payer: Self-pay | Admitting: Allergy

## 2020-09-20 ENCOUNTER — Other Ambulatory Visit: Payer: Self-pay | Admitting: Neurology

## 2020-09-20 DIAGNOSIS — E119 Type 2 diabetes mellitus without complications: Secondary | ICD-10-CM

## 2020-09-20 DIAGNOSIS — J309 Allergic rhinitis, unspecified: Secondary | ICD-10-CM

## 2020-09-21 MED ORDER — ONETOUCH VERIO FLEX SYSTEM W/DEVICE KIT: PACK | 0 refills | Status: DC

## 2020-09-21 NOTE — Addendum Note (Signed)
Addended by: Nathanial Millman E on: 09/21/2020 12:36 PM   Modules accepted: Orders

## 2020-09-22 ENCOUNTER — Ambulatory Visit (INDEPENDENT_AMBULATORY_CARE_PROVIDER_SITE_OTHER): Payer: Medicare Other | Admitting: *Deleted

## 2020-09-22 DIAGNOSIS — J309 Allergic rhinitis, unspecified: Secondary | ICD-10-CM

## 2020-09-27 ENCOUNTER — Other Ambulatory Visit: Payer: Self-pay | Admitting: Neurology

## 2020-09-29 ENCOUNTER — Ambulatory Visit: Payer: Self-pay

## 2020-10-06 ENCOUNTER — Ambulatory Visit (INDEPENDENT_AMBULATORY_CARE_PROVIDER_SITE_OTHER): Payer: Medicare Other

## 2020-10-06 DIAGNOSIS — J309 Allergic rhinitis, unspecified: Secondary | ICD-10-CM | POA: Diagnosis not present

## 2020-10-12 ENCOUNTER — Other Ambulatory Visit: Payer: Self-pay | Admitting: *Deleted

## 2020-10-12 ENCOUNTER — Encounter: Payer: Self-pay | Admitting: *Deleted

## 2020-10-12 NOTE — Patient Outreach (Signed)
[place] [facility]) Care Management  [place]  10/12/2020   Kimberly Lam 08/21/1946 [phone]  RN Health Coach telephone call to patient.  [employer] compliance verified. Patient A1C is 6.7. Patient is working to keep A1C at her goal range. Patient stated she does eat a little bread and some starches. Patient is taking medications as prescribed. Patient does not take the flu shot. She will plan to schedule COVID booster in December. Patient will be changing insurances the 1st of the year to [facility].  Encounter Medications:  Outpatient Encounter Medications as of 10/12/2020  Medication Sig Note  . Alcohol Swabs (B-D SINGLE USE SWABS REGULAR) PADS USE TO TEST BLOOD GLUCOSE FOUR TIMES DAILY   . alendronate (FOSAMAX) 70 MG tablet TAKE 1 TABLET BY MOUTH 1  TIME A WEEK. TAKE WITH A  FULL GLASS OF WATER ON AN  EMPTY STOMACH.   Marland Kitchen Ascorbic Acid (VITAMIN C) 1000 MG tablet Take 1,000 mg by mouth as needed. 04/11/2019: Patient states that she takes it daily   . Azelastine HCl 0.15 % SOLN Use 1-2 sprays per nostril twice a day as needed for sinus drainage.   Kimberly Lam (BERBERINE COMPLEX PO) Take by mouth daily.   . Blood Glucose Monitoring Suppl ([place]) w/Device KIT USE AS DIRECTED   . Calcium Carbonate-Vitamin D (CALCIUM 600+D PO) Take by mouth.   . cholecalciferol (VITAMIN D) 1000 units tablet Take 1,000 Units by mouth daily.   Marland Kitchen CINNAMON PO Take 1 tablet by mouth as needed.   . Cranberry 450 MG CAPS Take 1 capsule by mouth as needed. 04/11/2019: Patient takes it daily   . Cyanocobalamin (VITAMIN B-12 CR) 1500 MCG TBCR Take 1 tablet by mouth as needed. 04/11/2019: Patient takes once daily   . Echinacea-Goldenseal (ECHINACEA COMB/GOLDEN SEAL PO) Take 1 tablet by mouth as needed. Reported on 12/16/2015 03/14/2019: PRN   . EPINEPHrine (EPIPEN 2-PAK) 0.3 mg/0.3 mL IJ SOAJ injection Inject 0.3 mg into the muscle as needed for anaphylaxis.  (Patient not taking: Reported on 09/07/2020)   . fluticasone (FLONASE) 50 MCG/ACT nasal spray USE 1 SPRAY IN BOTH  NOSTRILS TWICE DAILY   . gabapentin (NEURONTIN) 300 MG capsule Take 2 capsules (600 mg total) by mouth 3 (three) times daily.   Nyoka Cowden Tea, Camillia sinensis, (GREEN TEA PO) Take by mouth as needed. Reported on 12/16/2015 03/14/2019: PRN  . ipratropium (ATROVENT) 0.06 % nasal spray USE 2 SPRAYS IN BOTH  NOSTRILS 3 TIMES DAILY AS  NEEDED FOR RHINITIS   . Lactobacillus (ACIDOPHILUS PROBIOTIC PO) Take 1 tablet by mouth daily.   Elmore Guise Devices [facility] DELICA PLUS LANCING) MISC USE AS DIRECTED   . Lancets (ONETOUCH DELICA PLUS [id number]) MISC 1 each by Does not apply route in the morning, at noon, in the evening, and at bedtime.   Marland Kitchen levocetirizine (XYZAL) 5 MG tablet TAKE 1 TABLET BY MOUTH IN  THE EVENING   . MELATONIN ER PO Take by mouth.   . meloxicam (MOBIC) 7.5 MG tablet Take 1 tablet (7.5 mg total) by mouth daily.   . metFORMIN (GLUCOPHAGE-XR) 500 MG 24 hr tablet TAKE 2 TABLETS BY MOUTH  TWICE DAILY   . montelukast (SINGULAIR) 10 MG tablet TAKE 1 TABLET BY MOUTH AT  BEDTIME   . Omega-3 Fatty Acids (OMEGA 3 PO) Take 1 tablet by mouth as needed.   Marland Kitchen omeprazole (PRILOSEC) 40 MG capsule Take 40 mg by mouth daily.   Marland Kitchen  ONETOUCH VERIO test strip USE TO TEST BLOOD GLUCOSE FOUR TIMES DAILY   . Polyethyl Glycol-Propyl Glycol (SYSTANE FREE OP) Apply 1 drop to eye daily. Uses thera work for dr eyes per pt   . POTASSIUM PO Take by mouth.   . ramipril (ALTACE) 2.5 MG capsule TAKE 1 CAPSULE BY MOUTH  DAILY   . simvastatin (ZOCOR) 20 MG tablet TAKE 1 TABLET BY MOUTH  DAILY   . Spacer/Aero-Holding Chambers (AEROCHAMBER PLUS) inhaler Use as instructed with MDI   . Stinging Nettle 2 % POWD Take 1 tablet by mouth as needed. Reported on 12/16/2015   . traZODone (DESYREL) 50 MG tablet TAKE 1/2 TO 1 TABLET BY  MOUTH AT BEDTIME AS NEEDED. FOR SLEEP   . TURMERIC PO Take 800 mg by mouth as needed.  Reported on 12/16/2015 04/11/2019: Patient takes dialy  . VENTOLIN HFA 108 (90 Base) MCG/ACT inhaler Inhale 2 puffs into the lungs every 6 (six) hours as needed for wheezing or shortness of breath.    No facility-administered encounter medications on file as of 10/12/2020.    Functional Status:  In your present state of health, do you have any difficulty performing the following activities: 09/03/2020 01/14/2020  Hearing? Kimberly Lam  Comment has had hearing test and does need hearing aids sometimes sounds muffled  Vision? N N  Difficulty concentrating or making decisions? N N  Walking or climbing stairs? N Y  Comment - pain in knees when she hasto climb alot of stairs  Dressing or bathing? N N  Doing errands, shopping? N N  Preparing Food and eating ? N N  Using the Toilet? N N  In the past six months, have you accidently leaked urine? N N  Do you have problems with loss of bowel control? N N  Managing your Medications? N N  Managing your Finances? N N  Housekeeping or managing your Housekeeping? N N  Some recent data might be hidden    Fall/Depression Screening: Fall Risk  10/12/2020 09/03/2020 01/14/2020  Falls in the past year? 0 1 0  Number falls in past yr: 0 0 0  Injury with Fall? 0 0 0  Risk for fall due to : - History of fall(s) -  Follow up Falls evaluation completed Falls evaluation completed;Falls prevention discussed Falls evaluation completed   PHQ 2/9 Scores 09/03/2020 01/14/2020 04/11/2019 03/12/2019 10/18/2018 06/13/2017  PHQ - 2 Score 0 0 0 0 0 0  PHQ- 9 Score 0 - - - - -    Assessment:  Goals Addressed              This Visit's Progress   .  (THN)(Obtain Eye Exam        Follow Up Date 01/18/2021   - schedule appointment with eye doctor    Why is this important?   Eye check-ups are important when you have diabetes.  Vision loss can be prevented.    Notes:     .  (THN)Diabetes Patient stated goal (pt-stated)        CARE PLAN ENTRY (see longtitudinal plan of  care for additional care plan information)  Objective:  Lab Results  Component Value Date   HGBA1C 6.7 05/13/2020 .   Lab Results  Component Value Date   CREATININE 0.87 02/13/2020   CREATININE 0.70 10/22/2018   CREATININE 0.89 10/18/2018 .   Marland Kitchen No results found for: EGFR  Current Barriers:  Marland Kitchen Knowledge Deficits related to basic Diabetes pathophysiology and self care/management  Case Manager Clinical Goal(s):  Over the next 90 days, patient will demonstrate improved adherence to prescribed treatment plan for diabetes self care/management as evidenced by: Maintaining A1C at 6.7 or < . Verbalize daily monitoring and recording of CBG within 90 days . Verbalize adherence to ADA/ carb modified diet within the next 90 days . Verbalize exercise 3 days/week . Verbalize adherence to prescribed medication regimen within the next 90 days   Interventions:  . Provided education to patient about basic DM disease process . Discussed plans with patient for ongoing care management follow up and provided patient with direct contact information for care management team . Advised patient on obtaining a medical alert system . Provided patient with educational material on Healthy Meal Planning  Patient Self Care Activities:  . Self administers oral medications as prescribed . Attends all scheduled provider appointments . Checks blood sugars as prescribed and utilize hyper and hypoglycemia protocol as needed . Adheres to prescribed ADA/carb modified . Call for Medical alert system  [place] will outreach within the month of October      .  (THN)Learn More About My Health        Follow Up Date 01/18/2021   - ask questions - bring a list of my medicines to the visit - speak up when I don't understand    Why is this important?   The best way to learn about your health and care is by talking to the doctor and nurse.  They will answer your questions and give you information in the way that  you like best.    Notes:     .  (THN)Monitor and Manage My Blood Sugar        Follow Up Date 01/18/2021   - check blood sugar at prescribed times - check blood sugar if I feel it is too high or too low - take the blood sugar log to all doctor visits - take the blood sugar meter to all doctor visits    Why is this important?   Checking your blood sugar at home helps to keep it from getting very high or very low.  Writing the results in a diary or log helps the doctor know how to care for you.  Your blood sugar log should have the time, date and the results.  Also, write down the amount of insulin or other medicine that you take.  Other information, like what you ate, exercise done and how you were feeling, will also be helpful.     Notes:     .  (THN)Perform Foot Care        Follow Up Date [id number]   - check feet daily for cuts, sores or redness - trim toenails straight across - wash and dry feet carefully every day - wear comfortable, cotton socks - wear comfortable, well-fitting shoes    Why is this important?   Good foot care is very important when you have diabetes.  There are many things you can do to keep your feet healthy and catch a problem early.    Notes:     .  (THN)Set My Target A1C        Follow Up Date 01/18/2021   - set target A1C    Why is this important?   Your target A1C is decided together by you and your doctor.  It is based on several things like your age and other health issues.    Notes:  A1C  target set at 6.7. Patient has reached now trying to maintain    .  Exercise 150 min/wk Moderate Activity         Plan:  Patient will maintain A1C at goal of 6.7 Patient will decrease starches and bread intake Patient will follow up in December for COVID vaccine booster Update assessment sent to PCP Patient will be changing insurance plan [phone] to [place] will schedule for next outreach in January  Moksh Loomer Hinds  Management [phone]   .

## 2020-10-12 NOTE — Patient Instructions (Signed)
Goals Addressed              This Visit's Progress   .  (THN)(Obtain Eye Exam        Follow Up Date 01/18/2021   - schedule appointment with eye doctor    Why is this important?   Eye check-ups are important when you have diabetes.  Vision loss can be prevented.    Notes:     .  (THN)Diabetes Patient stated goal (pt-stated)        CARE PLAN ENTRY (see longtitudinal plan of care for additional care plan information)  Objective:  Lab Results  Component Value Date   HGBA1C 6.7 05/13/2020 .   Lab Results  Component Value Date   CREATININE 0.87 02/13/2020   CREATININE 0.70 10/22/2018   CREATININE 0.89 10/18/2018 .   Marland Kitchen No results found for: EGFR  Current Barriers:  Marland Kitchen Knowledge Deficits related to basic Diabetes pathophysiology and self care/management  Case Manager Clinical Goal(s):  Over the next 90 days, patient will demonstrate improved adherence to prescribed treatment plan for diabetes self care/management as evidenced by: Maintaining A1C at 6.7 or < . Verbalize daily monitoring and recording of CBG within 90 days . Verbalize adherence to ADA/ carb modified diet within the next 90 days . Verbalize exercise 3 days/week . Verbalize adherence to prescribed medication regimen within the next 90 days   Interventions:  . Provided education to patient about basic DM disease process . Discussed plans with patient for ongoing care management follow up and provided patient with direct contact information for care management team . Advised patient on obtaining a medical alert system . Provided patient with educational material on Healthy Meal Planning  Patient Self Care Activities:  . Self administers oral medications as prescribed . Attends all scheduled provider appointments . Checks blood sugars as prescribed and utilize hyper and hypoglycemia protocol as needed . Adheres to prescribed ADA/carb modified . Call for Medical alert system  [place] will outreach  within the month of October      .  (THN)Learn More About My Health        Follow Up Date 01/18/2021   - ask questions - bring a list of my medicines to the visit - speak up when I don't understand    Why is this important?   The best way to learn about your health and care is by talking to the doctor and nurse.  They will answer your questions and give you information in the way that you like best.    Notes:     .  (THN)Monitor and Manage My Blood Sugar        Follow Up Date 01/18/2021   - check blood sugar at prescribed times - check blood sugar if I feel it is too high or too low - take the blood sugar log to all doctor visits - take the blood sugar meter to all doctor visits    Why is this important?   Checking your blood sugar at home helps to keep it from getting very high or very low.  Writing the results in a diary or log helps the doctor know how to care for you.  Your blood sugar log should have the time, date and the results.  Also, write down the amount of insulin or other medicine that you take.  Other information, like what you ate, exercise done and how you were feeling, will also be helpful.  Notes:     .  (THN)Perform Foot Care        Follow Up Date [id number]   - check feet daily for cuts, sores or redness - trim toenails straight across - wash and dry feet carefully every day - wear comfortable, cotton socks - wear comfortable, well-fitting shoes    Why is this important?   Good foot care is very important when you have diabetes.  There are many things you can do to keep your feet healthy and catch a problem early.    Notes:     .  (THN)Set My Target A1C        Follow Up Date 01/18/2021   - set target A1C    Why is this important?   Your target A1C is decided together by you and your doctor.  It is based on several things like your age and other health issues.    Notes:  A1C target set at 6.7. Patient has reached now trying to maintain     .  Exercise 150 min/wk Moderate Activity

## 2020-10-13 ENCOUNTER — Other Ambulatory Visit: Payer: Self-pay | Admitting: Neurology

## 2020-10-21 ENCOUNTER — Ambulatory Visit (INDEPENDENT_AMBULATORY_CARE_PROVIDER_SITE_OTHER): Payer: Medicare Other | Admitting: *Deleted

## 2020-10-21 DIAGNOSIS — J309 Allergic rhinitis, unspecified: Secondary | ICD-10-CM | POA: Diagnosis not present

## 2020-10-22 ENCOUNTER — Ambulatory Visit: Payer: Medicare Other | Admitting: Neurology

## 2020-10-22 NOTE — Progress Notes (Deleted)
PATIENT: Kimberly Lam DOB: [birth year]  REASON FOR VISIT: follow up HISTORY FROM: patient  HISTORY OF PRESENT ILLNESS: Today 10/22/20  HISTORY Kimberly Middlekauff Prudenis a 74 years old female, seen in refer by her primary care nurse practitioner [name] for evaluation of leg cramping, tremors, initial evaluation was on January 23, 2018.   She has past medical history of hypertension, diabetes, hyperlipidemia  I saw her initially in 2016 for evaluation of low back pain, radiating pain to bilateral hip, bilateral leg muscle cramping, and weakness.  She has history of low back pain since 2006, midline low back, occasionally go to posterior thigh, go down below her knee, worsening with prolonged standing, or walking,  Previous her low back pain has responded very well to epidural injection, but the benefit gradually fades away, over the years, she complains of worsening low back pain, radiating pain to bilateral lower extremity again, in addition, she complains of bilateral feet paresthesia,   She had a history of diabetes, she denies gait difficulty, no bowel and bladder incontinence, she denies bilateral upper extremity paresthesia or weakness.   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   Note from Dr. Franciso Bend, [facility], [place] in 2017, she was diagnosed with degenerative spondylolisthesis L4-L5, L5-S1, neurogenic claudication, low back pain, received epidural injection in Feb 2015, benefit lasted about one year  MRI of lumbar spine December 2016, There is a transitional L6/S1 vertebral body. 8 mm of anterolisthesis of L5 upon L6/S1 of a degenerative nature due to severe facet hypertrophy. At this level, there is mild spinal stenosis and moderately severe right foraminal narrowing. There could be  compression of the right L5 nerve root. Degenerative changes at L6/S1-S2 lead to severe right foraminal narrowing that could lead to compression of the exiting right S1 nerve root  She continue complains of chronic low back pain, today she also complains of new onset right hand shaking, that was intermittent, getting worse when she holding utensils, writing, mild involvement on the left side, she continue complains of frequent lower extremity cramping especially on the right side, difficulty to walk in the morning. There was no family history of tremor  UPDATE Nov 5th 2019: She was given prescription of gabapentin for her bilateral feet paresthesia, 300 mg 4 times a day does help her lower extremity cramps, but complains of daytime sleepiness, but she often woke up in the middle of the night complains of left muscle cramping, difficulty going to sleep,  We again personally reviewed MRI of lumbar spine in December 2016, transitional L6-S1 vertebral body, 8 mm of anterolisthesis of L5 upon S1 with severe facet hypertrophy, mild spinal stenosis, moderate severe right foraminal narrowing, potential compression of right L5 nerve roots,  UPDATE July 10 2019: She is overall doing much better, taking gabapentin 300 mg 2 tablets before bedtime, open skip daytime dose, epidural injection few months ago has helped her as well, she exercise regularly, denies gait abnormality, denies bowel and bladder incontinence  Update December 3, 2020SS:She overall continues to do very well. Apparently, she rotates hergabapentin dosing, says she takes gabapentin 300 mg, 2 capsules at least twice a day, at most she may take 4 times a day. She adjust the regimen depending on her leg cramps. She also has some right hip pain. She knows that her low back gets to bothering  her, she will do stretching, and she is able to control it well. She is going to relocate to [place], but may still come here if she can't find  another neurologist.   Update April 06, 2020 SS: Here today to discuss new problem of right hip pain.  Indicates since Friday, right hip/pelvic pain, with weightbearing and turning.  She denies fall or injury.  She denies pain down the legs, no numbness or weakness.  She denies any urinary symptoms.  She remains on gabapentin 600 mg twice a day for cramping in her legs.  Indicates this pain is not typical of her chronic low back pain, which is usually in the center of her back.  She presents today unaccompanied. She has not tried tylenol or ibuprofen.   Update October 22, 2020 SS: At last visit, complained of right hip pain, x-ray was negative, symptoms felt to be coming from the lumbar spine, gabapentin increased to 600 mg 3 times a day, added meloxicam 7.5 mg daily  REVIEW OF SYSTEMS: Out of a complete 14 system review of symptoms, the patient complains only of the following symptoms, and all other reviewed systems are negative.  ALLERGIES: Allergies  Allergen Reactions  . Peanut-Containing Drug Products Anaphylaxis  . Eggs Or Egg-Derived Products Nausea Only    Nausea only- can eat cakes with eggs   . Milk-Related Compounds Nausea And Vomiting    Milk and cheese  . Actos [Pioglitazone] Nausea Only  . Soy Allergy Nausea Only    Pt states had allergy testing and was told allergic to soy-causes nausea but no other reactions  . Vicodin [Hydrocodone-Acetaminophen] Nausea Only    HOME MEDICATIONS: Outpatient Medications Prior to Visit  Medication Sig Dispense Refill  . Alcohol Swabs (B-D SINGLE USE SWABS REGULAR) PADS USE TO TEST BLOOD GLUCOSE FOUR TIMES DAILY 200 each 3  . alendronate (FOSAMAX) 70 MG tablet TAKE 1 TABLET BY MOUTH 1  TIME A WEEK. TAKE WITH A  FULL GLASS OF WATER ON AN  EMPTY STOMACH. 12 tablet 3  . Ascorbic Acid (VITAMIN C) 1000 MG tablet Take 1,000 mg by mouth as needed.    . Azelastine HCl 0.15 % SOLN Use 1-2 sprays per nostril twice a day as needed for sinus drainage.  30 mL 5  . Barberry-Oreg Grape-Goldenseal (BERBERINE COMPLEX PO) Take by mouth daily.    . Blood Glucose Monitoring Suppl (ONETOUCH VERIO FLEX SYSTEM) w/Device KIT USE AS DIRECTED 1 kit 0  . Calcium Carbonate-Vitamin D (CALCIUM 600+D PO) Take by mouth.    . cholecalciferol (VITAMIN D) 1000 units tablet Take 1,000 Units by mouth daily.    Marland Kitchen CINNAMON PO Take 1 tablet by mouth as needed.    . Cranberry 450 MG CAPS Take 1 capsule by mouth as needed.    . Cyanocobalamin (VITAMIN B-12 CR) 1500 MCG TBCR Take 1 tablet by mouth as needed.    . Echinacea-Goldenseal (ECHINACEA COMB/GOLDEN SEAL PO) Take 1 tablet by mouth as needed. Reported on 12/16/2015    . EPINEPHrine (EPIPEN 2-PAK) 0.3 mg/0.3 mL IJ SOAJ injection Inject 0.3 mg into the muscle as needed for anaphylaxis. (Patient not taking: Reported on 09/07/2020)    . fluticasone (FLONASE) 50 MCG/ACT nasal spray USE 1 SPRAY IN BOTH  NOSTRILS TWICE DAILY 48 g 1  . gabapentin (NEURONTIN) 300 MG capsule Take 2 capsules (600 mg total) by mouth 3 (three) times daily. 540 capsule 0  . Green Tea, Camillia sinensis, (GREEN TEA PO) Take by  mouth as needed. Reported on 12/16/2015    . ipratropium (ATROVENT) 0.06 % nasal spray USE 2 SPRAYS IN BOTH  NOSTRILS 3 TIMES DAILY AS  NEEDED FOR RHINITIS 90 mL 3  . Lactobacillus (ACIDOPHILUS PROBIOTIC PO) Take 1 tablet by mouth daily.    Elmore Gu[employer] [facility] DELICA PLUS LANCING) MISC USE AS DIRECTED 1 each 1  . Lancets (ONETOUCH DELICA PLUS [id number]) MISC 1 each by Does not apply route in the morning, at noon, in the evening, and at bedtime. 400 each 3  . levocetirizine (XYZAL) 5 MG tablet TAKE 1 TABLET BY MOUTH IN  THE EVENING 90 tablet 1  . MELATONIN ER PO Take by mouth.    . meloxicam (MOBIC) 7.5 MG tablet Take 1 tablet (7.5 mg total) by mouth daily. 30 tablet 1  . metFORMIN (GLUCOPHAGE-XR) 500 MG 24 hr tablet TAKE 2 TABLETS BY MOUTH  TWICE DAILY 360 tablet 3  . montelukast (SINGULAIR) 10 MG tablet TAKE 1 TABLET BY  MOUTH AT  BEDTIME 90 tablet 3  . Omega-3 Fatty Acids (OMEGA 3 PO) Take 1 tablet by mouth as needed.    Marland Kitchen omeprazole (PRILOSEC) 40 MG capsule Take 40 mg by mouth daily.    Glory Rosebush VERIO test strip USE TO TEST BLOOD GLUCOSE FOUR TIMES DAILY 400 each 3  . Polyethyl Glycol-Propyl Glycol (SYSTANE FREE OP) Apply 1 drop to eye daily. Uses thera work for dr eyes per pt    . POTASSIUM PO Take by mouth.    . ramipril (ALTACE) 2.5 MG capsule TAKE 1 CAPSULE BY MOUTH  DAILY 90 capsule 3  . simvastatin (ZOCOR) 20 MG tablet TAKE 1 TABLET BY MOUTH  DAILY 90 tablet 3  . Spacer/Aero-Holding Chambers (AEROCHAMBER PLUS) inhaler Use as instructed with MDI 1 each 2  . Stinging Nettle 2 % POWD Take 1 tablet by mouth as needed. Reported on 12/16/2015    . traZODone (DESYREL) 50 MG tablet TAKE 1/2 TO 1 TABLET BY  MOUTH AT BEDTIME AS NEEDED. FOR SLEEP 90 tablet 3  . TURMERIC PO Take 800 mg by mouth as needed. Reported on 12/16/2015    . VENTOLIN HFA 108 (90 Base) MCG/ACT inhaler Inhale 2 puffs into the lungs every 6 (six) hours as needed for wheezing or shortness of breath.     No facility-administered medications prior to visit.    PAST MEDICAL HISTORY: Past Medical History:  Diagnosis Date  . Acid reflux   . Allergic rhinitis   . Allergy   . Anemia   . Arthritis    per MD- pt states no s/s of this   . Asthma   . Bilateral sciatica   . Cataract    removed both eyes   . Chronic bilateral low back pain   . Colon polyps   . Diverticulosis   . DM type 2 (diabetes mellitus, type 2) ([place])   . Environmental allergies    flowers, pollen, trees  . GERD (gastroesophageal reflux disease)   . Hyperlipidemia   . Low back pain   . Migraine    past hx- control as allergies are controlled   . Osteoporosis   . PONV (postoperative nausea and vomiting)     PAST SURGICAL HISTORY: Past Surgical History:  Procedure Laterality Date  . CATARACT EXTRACTION, BILATERAL Bilateral 2012  . CESAREAN SECTION     x 2  .  COLONOSCOPY     > 10 yrs ago- unsure if removed polyps as was told  sev.different things after this colon   . DENTAL SURGERY     molars removed   . DILATION AND CURETTAGE OF UTERUS    . NASAL SINUS SURGERY    . TUBAL LIGATION      FAMILY HISTORY: Family History  Problem Relation Age of Onset  . Asthma Father   . Emphysema Father   . Alcohol abuse Father   . Diabetes Mellitus II Brother        x 2  . Hyperlipidemia Brother   . Hypertension Brother   . Pancreatic cancer Brother   . Prostate cancer Brother   . Diabetes Brother   . Heart disease Maternal Grandmother        hardening of the arteries  . Heart murmur Daughter   . Glaucoma Brother   . Breast cancer Neg Hx   . Colon cancer Neg Hx   . Colon polyps Neg Hx   . Esophageal cancer Neg Hx   . Rectal cancer Neg Hx   . Stomach cancer Neg Hx     SOCIAL HISTORY: Social History   Socioeconomic History  . Marital status: Married    Spouse name: Not on file  . Number of children: 2  . Years of education: 53  . Highest education level: Not on file  Occupational History  . Occupation: Retired  Tobacco Use  . Smoking status: Never Smoker  . Smokeless tobacco: Never Used  Vaping Use  . Vaping Use: Never used  Substance and Sexual Activity  . Alcohol use: No    Alcohol/week: 0.0 standard drinks  . Drug use: No  . Sexual activity: Not on file  Other Topics Concern  . Not on file  Social History Narrative      Married 1 son one daughter, relocated to [place] after living in [place] for many years. She is a Writer of [employer]   No caffeine.          Lives at home with her husband and brother.   Right-handed.   No caffeine use.   Social Determinants of Health   Financial Resource Strain: Low Risk   . Difficulty of Paying Living Expenses: Not hard at all  Food Insecurity: No Food Insecurity  . Worried About Charity fundraiser in the Last Year: Never true  . Ran Out of Food in the  Last Year: Never true  Transportation Needs: No Transportation Needs  . Lack of Transportation (Medical): No  . Lack of Transportation (Non-Medical): No  Physical Activity: Insufficiently Active  . Days of Exercise per Week: 3 days  . Minutes of Exercise per Session: 30 min  Stress: No Stress Concern Present  . Feeling of Stress : Not at all  Social Connections: Moderately Integrated  . Frequency of Communication with Friends and Family: More than three times a week  . Frequency of Social Gatherings with Friends and Family: Once a week  . Attends Religious Services: More than 4 times per year  . Active Member of Clubs or Organizations: No  . Attends Archivist Meetings: Never  . Marital Status: Married  Human resources officer Violence: Not At Risk  . Fear of Current or Ex-Partner: No  . Emotionally Abused: No  . Physically Abused: No  . Sexually Abused: No      PHYSICAL EXAM  There were no vitals filed for this visit. There is no height or weight on file to calculate BMI.  Generalized: Well developed,  in no acute distress   Neurological examination  Mentation: Alert oriented to time, place, history taking. Follows all commands speech and language fluent Cranial nerve II-XII: Pupils were equal round reactive to light. Extraocular movements were full, visual field were full on confrontational test. Facial sensation and strength were normal. Uvula tongue midline. Head turning and shoulder shrug  were normal and symmetric. Motor: The motor testing reveals 5 over 5 strength of all 4 extremities. Good symmetric motor tone is noted throughout.  Sensory: Sensory testing is intact to soft touch on all 4 extremities. No evidence of extinction is noted.  Coordination: Cerebellar testing reveals good finger-nose-finger and heel-to-shin bilaterally.  Gait and station: Gait is normal. Tandem gait is normal. Romberg is negative. No drift is seen.  Reflexes: Deep tendon reflexes are  symmetric and normal bilaterally.   DIAGNOSTIC DATA (LABS, IMAGING, TESTING) - I reviewed patient records, labs, notes, testing and imaging myself where available.  Lab Results  Component Value Date   WBC 2.9 (L) 02/13/2020   HGB 13.5 02/13/2020   HCT 39.8 02/13/2020   MCV 92.0 02/13/2020   PLT 213.0 02/13/2020      Component Value Date/Time   NA 137 02/13/2020 0901   NA 140 04/12/2017 0000   K 4.7 02/13/2020 0901   CL 101 02/13/2020 0901   CO2 30 02/13/2020 0901   GLUCOSE 141 (H) 02/13/2020 0901   BUN 12 02/13/2020 0901   BUN 11 04/12/2017 0000   CREATININE 0.87 02/13/2020 0901   CALCIUM 10.0 02/13/2020 0901   PROT 7.0 02/13/2020 0901   ALBUMIN 4.2 02/13/2020 0901   AST 15 02/13/2020 0901   ALT 15 02/13/2020 0901   ALKPHOS 52 02/13/2020 0901   BILITOT 0.4 02/13/2020 0901   Lab Results  Component Value Date   CHOL 159 02/13/2020   HDL 58.50 02/13/2020   LDLCALC 78 02/13/2020   TRIG 111.0 02/13/2020   CHOLHDL 3 02/13/2020   Lab Results  Component Value Date   HGBA1C 6.7 05/13/2020   No results found for: VITAMINB12 Lab Results  Component Value Date   TSH 2.59 02/13/2020      ASSESSMENT AND PLAN 74 y.o. year old female  has a past medical history of Acid reflux, Allergic rhinitis, Allergy, Anemia, Arthritis, Asthma, Bilateral sciatica, Cataract, Chronic bilateral low back pain, Colon polyps, Diverticulosis, DM type 2 (diabetes mellitus, type 2) ([place]), Environmental allergies, GERD (gastroesophageal reflux disease), Hyperlipidemia, Low back pain, Migraine, Osteoporosis, and PONV (postoperative nausea and vomiting). here with:  1.  Chronic low back pain, pain radiating to bilateral lower extremities 2.  Right hip/pelvic pain -X-ray right hip and pelvis was negative, no acute fracture or dislocation, no significant arthritic change, felt hip pain could be coming from the lumbar spine    I spent 15 minutes with the patient. 50% of this time was spent   Butler Denmark, [place], DNP 10/22/2020, 5:56 AM [facility] [address] [place], [place] [address] [phone]

## 2020-11-03 ENCOUNTER — Ambulatory Visit (INDEPENDENT_AMBULATORY_CARE_PROVIDER_SITE_OTHER): Payer: Medicare Other

## 2020-11-03 DIAGNOSIS — J309 Allergic rhinitis, unspecified: Secondary | ICD-10-CM | POA: Diagnosis not present

## 2020-11-10 ENCOUNTER — Ambulatory Visit (INDEPENDENT_AMBULATORY_CARE_PROVIDER_SITE_OTHER): Payer: Medicare Other

## 2020-11-10 DIAGNOSIS — J309 Allergic rhinitis, unspecified: Secondary | ICD-10-CM | POA: Diagnosis not present

## 2020-11-10 NOTE — Progress Notes (Signed)
EXP 11/16/21

## 2020-11-11 ENCOUNTER — Encounter: Payer: Self-pay | Admitting: Adult Health

## 2020-11-11 ENCOUNTER — Other Ambulatory Visit: Payer: Self-pay

## 2020-11-11 ENCOUNTER — Ambulatory Visit (INDEPENDENT_AMBULATORY_CARE_PROVIDER_SITE_OTHER): Payer: Medicare Other | Admitting: Adult Health

## 2020-11-11 VITALS — BP 110/62 | HR 85 | Temp 98.9°F | Wt 132.0 lb

## 2020-11-11 DIAGNOSIS — E119 Type 2 diabetes mellitus without complications: Secondary | ICD-10-CM | POA: Diagnosis not present

## 2020-11-11 LAB — POCT GLYCOSYLATED HEMOGLOBIN (HGB A1C): Hemoglobin A1C: 6.6 % — AB (ref 4.0–5.6)

## 2020-11-11 MED ORDER — FREESTYLE LIBRE 2 SENSOR MISC: 3 refills | Status: DC

## 2020-11-11 NOTE — Progress Notes (Signed)
Subjective:    Patient ID: Kimberly Lam, female    DOB: 11-10-1946, 74 y.o.   MRN: [id number]  HPI 74 year old female who  has a past medical history of Acid reflux, Allergic rhinitis, Allergy, Anemia, Arthritis, Asthma, Bilateral sciatica, Cataract, Chronic bilateral low back pain, Colon polyps, Diverticulosis, DM type 2 (diabetes mellitus, type 2) ([place]), Environmental allergies, GERD (gastroesophageal reflux disease), Hyperlipidemia, Low back pain, Migraine, Osteoporosis, and PONV (postoperative nausea and vomiting).  She presents to the office today for 85-monthfollow-up regarding diabetes mellitus.  He is currently prescribed Metformin 1000 mg XR  twice daily.  She does monitor her blood sugars at home and reports readings in the 130s.  She has not had any episodes of hypoglycemia.  Lab Results  Component Value Date   HGBA1C 6.6 (A) 11/11/2020   Wt Readings from Last 3 Encounters:  11/11/20 132 lb (59.9 kg)  09/07/20 131 lb 9.6 oz (59.7 kg)  05/13/20 130 lb (59 kg)   She is interested in seeing how much the lUzbekistansystem is with her insurance.    Review of Systems See HPI   Past Medical History:  Diagnosis Date   Acid reflux    Allergic rhinitis    Allergy    Anemia    Arthritis    per MD- pt states no s/s of this    Asthma    Bilateral sciatica    Cataract    removed both eyes    Chronic bilateral low back pain    Colon polyps    Diverticulosis    DM type 2 (diabetes mellitus, type 2) ([facility])    Environmental allergies    flowers, pollen, trees   GERD (gastroesophageal reflux disease)    Hyperlipidemia    Low back pain    Migraine    past hx- control as allergies are controlled    Osteoporosis    PONV (postoperative nausea and vomiting)     Social History   Socioeconomic History   Marital status: Married    Spouse name: Not on file   Number of children: 2   Years of education: 16   Highest education level: Not on file    Occupational History   Occupation: Retired  Tobacco Use   Smoking status: Never Smoker   Smokeless tobacco: Never Used  VScientific laboratory technicianUse: Never used  Substance and Sexual Activity   Alcohol use: No    Alcohol/week: 0.0 standard drinks   Drug use: No   Sexual activity: Not on file  Other Topics Concern   Not on file  Social History Narrative      Mar[place] 1 son one daughter, [place] 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   No caffeine.          Lives at home with her husband and brother.   Right-handed.   No caffeine use.   Social Determinants of Health   Financial Resource Strain: Low Risk    Difficulty of Paying Living Expenses: Not hard at all  Food Insecurity: No Food Insecurity   Worried About RCharity fundraiserin the Last Year: Never true   RCarlislein the Last Year: Never true  Transportation Needs: No Transportation Needs   Lack of Transportation (Medical): No   Lack of Transportation (Non-Medical): No  Physical Activity: Insufficiently Active   Days of Exercise  per Week: 3 days   Minutes of Exercise per Session: 30 min  Stress: No Stress Concern Present   Feeling of Stress : Not at all  Social Connections: Moderately Integrated   Frequency of Communication with Friends and Family: More than three times a week   Frequency of Social Gatherings with Friends and Family: Once a week   Attends Religious Services: More than 4 times per year   Active Member of [employer] or Organizations: No   Attends Music therapist: Never   Marital Status: Married  Human resources officer Violence: Not At Risk   Fear of Current or Ex-Partner: No   Emotionally Abused: No   Physically Abused: No   Sexually Abused: No    Past Surgical History:  Procedure Laterality Date   CATARACT EXTRACTION, BILATERAL Bilateral 2012   CESAREAN SECTION     x 2   COLONOSCOPY      > 10 yrs ago- unsure if removed polyps as was told sev.different things after this colon    DENTAL SURGERY     molars removed    DILATION AND CURETTAGE OF UTERUS     NASAL SINUS SURGERY     TUBAL LIGATION      Family History  Problem Relation Age of Onset   Asthma Father    Emphysema Father    Alcohol abuse Father    Diabetes Mellitus II Brother        x 2   Hyperlipidemia Brother    Hypertension Brother    Pancreatic cancer Brother    Prostate cancer Brother    Diabetes Brother    Heart disease Maternal Grandmother        hardening of the arteries   Heart murmur Daughter    Glaucoma Brother    Breast cancer Neg Hx    Colon cancer Neg Hx    Colon polyps Neg Hx    Esophageal cancer Neg Hx    Rectal cancer Neg Hx    Stomach cancer Neg Hx     Allergies  Allergen Reactions   Peanut-Containing Drug Products Anaphylaxis   Eggs Or Egg-Derived Products Nausea Only    Nausea only- can eat cakes with eggs    Milk-Related Compounds Nausea And Vomiting    Milk and cheese   Actos [Pioglitazone] Nausea Only   Soy Allergy Nausea Only    Pt states had allergy testing and was told allergic to soy-causes nausea but no other reactions   Vicodin [Hydrocodone-Acetaminophen] Nausea Only    Current Outpatient Medications on File Prior to Visit  Medication Sig Dispense Refill   alendronate (FOSAMAX) 70 MG tablet TAKE 1 TABLET BY MOUTH 1  TIME A WEEK. TAKE WITH A  FULL GLASS OF WATER ON AN  EMPTY STOMACH. 12 tablet 3   Ascorbic Acid (VITAMIN C) 1000 MG tablet Take 1,000 mg by mouth as needed.     Azelastine HCl 0.15 % SOLN Use 1-2 sprays per nostril twice a day as needed for sinus drainage. 30 mL 5   Barberry-Oreg Grape-Goldenseal (BERBERINE COMPLEX PO) Take by mouth daily.     Blood Glucose Monitoring Suppl (ONETOUCH VERIO FLEX SYSTEM) w/Device KIT USE AS DIRECTED 1 kit 0   Calcium Carbonate-Vitamin D (CALCIUM 600+D PO) Take by mouth.      cholecalciferol (VITAMIN D) 1000 units tablet Take 1,000 Units by mouth daily.     CINNAMON PO Take 1 tablet by mouth as needed.     Cranberry 450 MG CAPS  Take 1 capsule by mouth as needed.     Cyanocobalamin (VITAMIN B-12 CR) 1500 MCG TBCR Take 1 tablet by mouth as needed.     Echinacea-Goldenseal (ECHINACEA COMB/GOLDEN SEAL PO) Take 1 tablet by mouth as needed. Reported on 12/16/2015     fluticasone (FLONASE) 50 MCG/ACT nasal spray USE 1 SPRAY IN BOTH  NOSTRILS TWICE DAILY 48 g 1   gabapentin (NEURONTIN) 300 MG capsule Take 2 capsules (600 mg total) by mouth 3 (three) times daily. 540 capsule 0   Green Tea, Camillia sinensis, (GREEN TEA PO) Take by mouth as needed. Reported on 12/16/2015     ipratropium (ATROVENT) 0.06 % nasal spray USE 2 SPRAYS IN BOTH  NOSTRILS 3 TIMES DAILY AS  NEEDED FOR RHINITIS 90 mL 3   levocetirizine (XYZAL) 5 MG tablet TAKE 1 TABLET BY MOUTH IN  THE EVENING 90 tablet 1   MELATONIN ER PO Take by mouth.     meloxicam (MOBIC) 7.5 MG tablet Take 1 tablet (7.5 mg total) by mouth daily. 30 tablet 1   metFORMIN (GLUCOPHAGE-XR) 500 MG 24 hr tablet TAKE 2 TABLETS BY MOUTH  TWICE DAILY 360 tablet 3   montelukast (SINGULAIR) 10 MG tablet TAKE 1 TABLET BY MOUTH AT  BEDTIME 90 tablet 3   Omega-3 Fatty Acids (OMEGA 3 PO) Take 1 tablet by mouth as needed.     omeprazole (PRILOSEC) 40 MG capsule Take 40 mg by mouth daily.     ONETOUCH VERIO test strip USE TO TEST BLOOD GLUCOSE FOUR TIMES DAILY 400 each 3   Polyethyl Glycol-Propyl Glycol (SYSTANE FREE OP) Apply 1 drop to eye daily. Uses thera work for dr eyes per pt     POTASSIUM PO Take by mouth.     ramipril (ALTACE) 2.5 MG capsule TAKE 1 CAPSULE BY MOUTH  DAILY 90 capsule 3   simvastatin (ZOCOR) 20 MG tablet TAKE 1 TABLET BY MOUTH  DAILY 90 tablet 3   Spacer/Aero-Holding Chambers (AEROCHAMBER PLUS) inhaler Use as instructed with MDI 1 each 2   Stinging Nettle 2 % POWD Take 1 tablet by mouth as needed.  Reported on 12/16/2015     traZODone (DESYREL) 50 MG tablet TAKE 1/2 TO 1 TABLET BY  MOUTH AT BEDTIME AS NEEDED. FOR SLEEP 90 tablet 3   TURMERIC PO Take 800 mg by mouth as needed. Reported on 12/16/2015     VENTOLIN HFA 108 (90 Base) MCG/ACT inhaler Inhale 2 puffs into the lungs every 6 (six) hours as needed for wheezing or shortness of breath.     Alcohol Swabs (B-D SINGLE USE SWABS REGULAR) PADS USE TO TEST BLOOD GLUCOSE FOUR TIMES DAILY 200 each 3   EPINEPHrine (EPIPEN 2-PAK) 0.3 mg/0.3 mL IJ SOAJ injection Inject 0.3 mg into the muscle as needed for anaphylaxis. (Patient not taking: Reported on 09/07/2020)     Lactobacillus (ACIDOPHILUS PROBIOTIC PO) Take 1 tablet by mouth daily.     Lancet Devices (ONETOUCH DELICA PLUS LANCING) MISC USE AS DIRECTED 1 each 1   Lancets (ONETOUCH DELICA PLUS [id number]) MISC 1 each by Does not apply route in the morning, at noon, in the evening, and at bedtime. 400 each 3   No current facility-administered medications on file prior to visit.    BP 110/62    Pulse 85    Temp 98.9 F (37.2 C)    Wt 132 lb (59.9 kg)    SpO2 98%    BMI 22.66 kg/m  Objective:   Physical Exam Vitals and nursing note reviewed.  Constitutional:      General: She is not in acute distress.    Appearance: Normal appearance. She is well-developed. She is not ill-appearing.  HENT:     Head: Normocephalic and atraumatic.     Right Ear: Tympanic membrane, ear canal and external ear normal. There is no impacted cerumen.     Left Ear: Tympanic membrane, ear canal and external ear normal. There is no impacted cerumen.     Nose: Nose normal. No congestion or rhinorrhea.     Mouth/Throat:     Mouth: Mucous membranes are moist.     Pharynx: Oropharynx is clear. No oropharyngeal exudate or posterior oropharyngeal erythema.  Eyes:     General:        Right eye: No discharge.        Left eye: No discharge.     Extraocular Movements: Extraocular movements intact.      Conjunctiva/sclera: Conjunctivae normal.     Pupils: Pupils are equal, round, and reactive to light.  Neck:     Thyroid: No thyromegaly.     Vascular: No carotid bruit.     Trachea: No tracheal deviation.  Cardiovascular:     Rate and Rhythm: Normal rate and regular rhythm.     Pulses: Normal pulses.     Heart sounds: Normal heart sounds. No murmur heard.  No friction rub. No gallop.   Pulmonary:     Effort: Pulmonary effort is normal. No respiratory distress.     Breath sounds: Normal breath sounds. No stridor. No wheezing, rhonchi or rales.  Chest:     Chest wall: No tenderness.  Abdominal:     General: Abdomen is flat. Bowel sounds are normal. There is no distension.     Palpations: Abdomen is soft. There is no mass.     Tenderness: There is no abdominal tenderness. There is no right CVA tenderness, left CVA tenderness, guarding or rebound.     Hernia: No hernia is present.  Musculoskeletal:        General: No swelling, tenderness, deformity or signs of injury. Normal range of motion.     Cervical back: Normal range of motion and neck supple.     Right lower leg: No edema.     Left lower leg: No edema.  Lymphadenopathy:     Cervical: No cervical adenopathy.  Skin:    General: Skin is warm and dry.     Coloration: Skin is not jaundiced or pale.     Findings: No bruising, erythema, lesion or rash.  Neurological:     General: No focal deficit present.     Mental Status: She is alert and oriented to person, place, and time.     Cranial Nerves: No cranial nerve deficit.     Sensory: No sensory deficit.     Motor: No weakness.     Coordination: Coordination normal.     Gait: Gait normal.     Deep Tendon Reflexes: Reflexes normal.  Psychiatric:        Mood and Affect: Mood normal.        Behavior: Behavior normal.        Thought Content: Thought content normal.        Judgment: Judgment normal.       Assessment & Plan:  1. Diabetes mellitus without complication  ([place])  - POC HgB A1c- 6.6 - at goal. No change in medications  - Follow  up in Feb 2022 for CPE or sooner if needed - Continuous Blood Gluc Sensor (FREESTYLE LIBRE 2 SENSOR) MISC; Use with Elenor Legato app  Dispense: 6 each; Refill: 3  [name], NP

## 2020-11-16 DIAGNOSIS — J3089 Other allergic rhinitis: Secondary | ICD-10-CM | POA: Diagnosis not present

## 2020-11-17 DIAGNOSIS — J301 Allergic rhinitis due to pollen: Secondary | ICD-10-CM | POA: Diagnosis not present

## 2020-11-18 DIAGNOSIS — J302 Other seasonal allergic rhinitis: Secondary | ICD-10-CM

## 2020-11-24 ENCOUNTER — Ambulatory Visit (INDEPENDENT_AMBULATORY_CARE_PROVIDER_SITE_OTHER): Payer: Medicare Other

## 2020-11-24 DIAGNOSIS — J309 Allergic rhinitis, unspecified: Secondary | ICD-10-CM | POA: Diagnosis not present

## 2020-11-26 ENCOUNTER — Encounter: Payer: Self-pay | Admitting: Adult Health

## 2020-12-01 ENCOUNTER — Ambulatory Visit (INDEPENDENT_AMBULATORY_CARE_PROVIDER_SITE_OTHER): Payer: Medicare Other

## 2020-12-01 DIAGNOSIS — J309 Allergic rhinitis, unspecified: Secondary | ICD-10-CM

## 2020-12-05 ENCOUNTER — Encounter: Payer: Self-pay | Admitting: Neurology

## 2020-12-07 MED ORDER — GABAPENTIN 300 MG PO CAPS: 600.0000 mg | ORAL_CAPSULE | Freq: Three times a day (TID) | ORAL | 0 refills | Status: DC

## 2020-12-08 ENCOUNTER — Ambulatory Visit (INDEPENDENT_AMBULATORY_CARE_PROVIDER_SITE_OTHER): Payer: Medicare Other

## 2020-12-08 DIAGNOSIS — J309 Allergic rhinitis, unspecified: Secondary | ICD-10-CM

## 2020-12-17 ENCOUNTER — Ambulatory Visit (INDEPENDENT_AMBULATORY_CARE_PROVIDER_SITE_OTHER): Payer: Medicare Other | Admitting: *Deleted

## 2020-12-17 DIAGNOSIS — J309 Allergic rhinitis, unspecified: Secondary | ICD-10-CM | POA: Diagnosis not present

## 2020-12-21 ENCOUNTER — Other Ambulatory Visit: Payer: Self-pay | Admitting: *Deleted

## 2020-12-21 NOTE — Patient Outreach (Signed)
[facility]) Care Management  [place]  12/21/2020   Kimberly Lam [place] 10/03/1946 [id number]  RN Health Coach telephone call to patient.  Hipaa compliance verified. Per patient she is doing good.Her fasting blood sugar is 112. Patient checks 3 times a day. Patient has noted that she needs to wait 2 1/2 hrs after eating to getting an accurate number instead of 1-2 hrs for non fasting. Patient A1C has decreased to 6.6 from 6.7. Patient is exercising by walking. Patient has decided to continue with [facility] since they are planning to move later in the year. She is doing very well meeting her goal. Now she is working on maintaining. Patient has agreed to further outreach calls.   Encounter Medications:  Outpatient Encounter Medications as of 12/21/2020  Medication Sig Note  . Alcohol Swabs (B-D SINGLE USE SWABS REGULAR) PADS USE TO TEST BLOOD GLUCOSE FOUR TIMES DAILY   . alendronate (FOSAMAX) 70 MG tablet TAKE 1 TABLET BY MOUTH 1  TIME A WEEK. TAKE WITH A  FULL GLASS OF WATER ON AN  EMPTY STOMACH.   Marland Kitchen Ascorbic Acid (VITAMIN C) 1000 MG tablet Take 1,000 mg by mouth as needed. 04/11/2019: Patient states that she takes it daily   . Azelastine HCl 0.15 % SOLN Use 1-2 sprays per nostril twice a day as needed for sinus drainage.   Jolyne Loa Grape-Goldenseal (BERBERINE COMPLEX PO) Take by mouth daily.   . Blood Glucose Monitoring Suppl (Rudy) w/Device KIT USE AS DIRECTED   . Calcium Carbonate-Vitamin D (CALCIUM 600+D PO) Take by mouth.   . cholecalciferol (VITAMIN D) 1000 units tablet Take 1,000 Units by mouth daily.   Marland Kitchen CINNAMON PO Take 1 tablet by mouth as needed.   . Continuous Blood Gluc Sensor (FREESTYLE LIBRE 2 SENSOR) MISC Use with [employer]   . Cranberry 450 MG CAPS Take 1 capsule by mouth as needed. 04/11/2019: Patient takes it daily   . Cyanocobalamin (VITAMIN B-12 CR) 1500 MCG TBCR Take 1 tablet by mouth as needed. 04/11/2019: Patient takes once  daily   . Echinacea-Goldenseal (ECHINACEA COMB/GOLDEN SEAL PO) Take 1 tablet by mouth as needed. Reported on 12/16/2015 03/14/2019: PRN   . EPINEPHrine (EPIPEN 2-PAK) 0.3 mg/0.3 mL IJ SOAJ injection Inject 0.3 mg into the muscle as needed for anaphylaxis. (Patient not taking: Reported on 09/07/2020)   . fluticasone (FLONASE) 50 MCG/ACT nasal spray USE 1 SPRAY IN BOTH  NOSTRILS TWICE DAILY   . gabapentin (NEURONTIN) 300 MG capsule Take 2 capsules (600 mg total) by mouth 3 (three) times daily.   Nyoka Cowden Tea, Camillia sinensis, (GREEN TEA PO) Take by mouth as needed. Reported on 12/16/2015 03/14/2019: PRN  . ipratropium (ATROVENT) 0.06 % nasal spray USE 2 SPRAYS IN BOTH  NOSTRILS 3 TIMES DAILY AS  NEEDED FOR RHINITIS   . Lactobacillus (ACIDOPHILUS PROBIOTIC PO) Take 1 tablet by mouth daily.   Elmore Guise Devices [facility] DELICA PLUS LANCING) MISC USE AS DIRECTED   . Lancets (ONETOUCH DELICA PLUS [id number]) MISC 1 each by Does not apply route in the morning, at noon, in the evening, and at bedtime.   Marland Kitchen levocetirizine (XYZAL) 5 MG tablet TAKE 1 TABLET BY MOUTH IN  THE EVENING   . MELATONIN ER PO Take by mouth.   . meloxicam (MOBIC) 7.5 MG tablet Take 1 tablet (7.5 mg total) by mouth daily.   . metFORMIN (GLUCOPHAGE-XR) 500 MG 24 hr tablet TAKE 2 TABLETS BY MOUTH  TWICE DAILY   . montelukast (SINGULAIR) 10 MG tablet TAKE 1 TABLET BY MOUTH AT  BEDTIME   . Omega-3 Fatty Acids (OMEGA 3 PO) Take 1 tablet by mouth as needed.   Marland Kitchen omeprazole (PRILOSEC) 40 MG capsule Take 40 mg by mouth daily.   Glory Rosebush VERIO test strip USE TO TEST BLOOD GLUCOSE FOUR TIMES DAILY   . Polyethyl Glycol-Propyl Glycol (SYSTANE FREE OP) Apply 1 drop to eye daily. Uses thera work for dr eyes per pt   . POTASSIUM PO Take by mouth.   . ramipril (ALTACE) 2.5 MG capsule TAKE 1 CAPSULE BY MOUTH  DAILY   . simvastatin (ZOCOR) 20 MG tablet TAKE 1 TABLET BY MOUTH  DAILY   . Spacer/Aero-Holding Chambers (AEROCHAMBER PLUS) inhaler Use as  instructed with MDI   . Stinging Nettle 2 % POWD Take 1 tablet by mouth as needed. Reported on 12/16/2015   . traZODone (DESYREL) 50 MG tablet TAKE 1/2 TO 1 TABLET BY  MOUTH AT BEDTIME AS NEEDED. FOR SLEEP   . TURMERIC PO Take 800 mg by mouth as needed. Reported on 12/16/2015 04/11/2019: Patient takes dialy  . VENTOLIN HFA 108 (90 Base) MCG/ACT inhaler Inhale 2 puffs into the lungs every 6 (six) hours as needed for wheezing or shortness of breath.    No facility-administered encounter medications on file as of 12/21/2020.    Functional Status:  In your present state of health, do you have any difficulty performing the following activities: 09/03/2020 01/14/2020  Hearing? Tempie Donning  Comment has had hearing test and does need hearing aids sometimes sounds muffled  Vision? N N  Difficulty concentrating or making decisions? N N  Walking or climbing stairs? N Y  Comment - pain in knees when she hasto climb alot of stairs  Dressing or bathing? N N  Doing errands, shopping? N N  Preparing Food and eating ? N N  Using the Toilet? N N  In the past six months, have you accidently leaked urine? N N  Do you have problems with loss of bowel control? N N  Managing your Medications? N N  Managing your Finances? N N  Housekeeping or managing your Housekeeping? N N  Some recent data might be hidden    Fall/Depression Screening: Fall Risk  12/21/2020 10/12/2020 09/03/2020  Falls in the past year? 0 0 1  Number falls in past yr: 0 0 0  Injury with Fall? 0 0 0  Risk for fall due to : - - History of fall(s)  Follow up Falls evaluation completed Falls evaluation completed Falls evaluation completed;Falls prevention discussed   PHQ 2/9 Scores 09/03/2020 01/14/2020 04/11/2019 03/12/2019 10/18/2018 06/13/2017  PHQ - 2 Score 0 0 0 0 0 0  PHQ- 9 Score 0 - - - - -    Assessment:  Goals Addressed              This Visit's Progress   .  (THN)(Obtain Eye Exam   On track     Timeframe:  Long-Range Goal Priority:   Medium Start Date:    [id number]                         Expected End Date:   [id number]                   Follow Up Date [id number]   - schedule appointment with eye doctor    Why is this important?  Eye check-ups are important when you have diabetes.  Vision loss can be prevented.    Notes:  Last eye exam was [id number]    .  COMPLETED: (THN)Diabetes Patient stated goal (pt-stated)        CARE PLAN ENTRY Timeframe:  Short-Term Goal Priority:  High Start Date:   17001749                          Expected End Date:    [id number]                  (see longtitudinal plan of care for additional care plan information)  Objective:  Lab Results  Component Value Date   HGBA1C 6.7 05/13/2020 .   Lab Results  Component Value Date   CREATININE 0.87 02/13/2020   CREATININE 0.70 10/22/2018   CREATININE 0.89 10/18/2018 .   Marland Kitchen No results found for: EGFR  Current Barriers:  Marland Kitchen Knowledge Deficits related to basic Diabetes pathophysiology and self care/management  Case Manager Clinical Goal(s):  Over the next 90 days, patient will demonstrate improved adherence to prescribed treatment plan for diabetes self care/management as evidenced by: Maintaining A1C at 6.7 or < . Verbalize daily monitoring and recording of CBG within 90 days . Verbalize adherence to ADA/ carb modified diet within the next 90 days . Verbalize exercise 3 days/week . Verbalize adherence to prescribed medication regimen within the next 90 days   Interventions:  . Provided education to patient about basic DM disease process . Discussed plans with patient for ongoing care management follow up and provided patient with direct contact information for care management team . Advised patient on obtaining a medical alert system . Provided patient with educational material on Healthy Meal Planning  Patient Self Care Activities:  . Self administers oral medications as prescribed . Attends all scheduled provider  appointments . Checks blood sugars as prescribed and utilize hyper and hypoglycemia protocol as needed . Adheres to prescribed ADA/carb modified . Call for Medical alert system  [facility] will outreach within the month of October      .  COMPLETED: (THN)Learn More About My Health        Timeframe:  Short-Term Goal Priority:  Medium Start Date:    [id number]                         Expected End Date:    [id number]                   Follow Up Date 01/18/2021   - ask questions - bring a list of my medicines to the visit - speak up when I don't understand    Why is this important?   The best way to learn about your health and care is by talking to the doctor and nurse.  They will answer your questions and give you information in the way that you like best.    Notes:  Patient has met goal and is doing very well.    .  (THN)Monitor and Manage My Blood Sugar   On track     Timeframe:  Long-Range Goal Priority:  High Start Date:   93903009                          Expected End Date:  23300762  Follow Up Date [id number]   - check blood sugar at prescribed times - check blood sugar if I feel it is too high or too low - take the blood sugar log to all doctor visits - take the blood sugar meter to all doctor visits    Why is this important?   Checking your blood sugar at home helps to keep it from getting very high or very low.  Writing the results in a diary or log helps the doctor know how to care for you.  Your blood sugar log should have the time, date and the results.  Also, write down the amount of insulin or other medicine that you take.  Other information, like what you ate, exercise done and how you were feeling, will also be helpful.     Notes:  Managing her blood sugars and knowing what and how it affects her is what she is learning    .  (THN)Perform Foot Care   On track     Timeframe:  Long-Range Goal Priority:  Medium Start Date:   60630160                           Expected End Date: [id number]                     Follow Up Date [id number]   - check feet daily for cuts, sores or redness - trim toenails straight across - wash and dry feet carefully every day - wear comfortable, cotton socks - wear comfortable, well-fitting shoes    Why is this important?   Good foot care is very important when you have diabetes.  There are many things you can do to keep your feet healthy and catch a problem early.    Notes: Foot care is on going process for diabetics     .  (THN)Set My Target A1C   On track     Timeframe:  Short-Term Goal Priority:  High Start Date:  27062376                           Expected End Date:  28315176         Follow Up Date [id number] - set target A1C    Why is this important?   Your target A1C is decided together by you and your doctor.  It is based on several things like your age and other health issues.    Notes:  A1C target set at 6.7. Patient has reached now trying to maintain Patient has exceeded her goal by A1C is now 6.6. Monitoring patient for maintaining       Plan:  Follow-up:  Patient agrees to Care Plan and Follow-up. Patient has exceeded goal of 6.7 A1C and now it is 6.6 Patient will continue to exercise as weather permits Patient will monitor diet and eat healthy RN will follow up with Health maintenance RN will follow up outreach within the month of April RN sent update assessment to PCP  [facility] [phone]

## 2020-12-21 NOTE — Patient Instructions (Signed)
Goals Addressed              This Visit's Progress   .  (THN)(Obtain Eye Exam   On track     Timeframe:  Long-Range Goal Priority:  Medium Start Date:    [id number]                         Expected End Date:   [id number]                   Follow Up Date [id number]   - schedule appointment with eye doctor    Why is this important?   Eye check-ups are important when you have diabetes.  Vision loss can be prevented.    Notes:  Last eye exam was [id number]    .  COMPLETED: (THN)Diabetes Patient stated goal (pt-stated)        CARE PLAN ENTRY Timeframe:  Short-Term Goal Priority:  High Start Date:   [id number]                          Expected End Date:    [id number]                  (see longtitudinal plan of care for additional care plan information)  Objective:  Lab Results  Component Value Date   HGBA1C 6.7 05/13/2020 .   Lab Results  Component Value Date   CREATININE 0.87 02/13/2020   CREATININE 0.70 10/22/2018   CREATININE 0.89 10/18/2018 .   Marland Kitchen No results found for: EGFR  Current Barriers:  Marland Kitchen Knowledge Deficits related to basic Diabetes pathophysiology and self care/management  Case Manager Clinical Goal(s):  Over the next 90 days, patient will demonstrate improved adherence to prescribed treatment plan for diabetes self care/management as evidenced by: Maintaining A1C at 6.7 or < . Verbalize daily monitoring and recording of CBG within 90 days . Verbalize adherence to ADA/ carb modified diet within the next 90 days . Verbalize exercise 3 days/week . Verbalize adherence to prescribed medication regimen within the next 90 days   Interventions:  . Provided education to patient about basic DM disease process . Discussed plans with patient for ongoing care management follow up and provided patient with direct contact information for care management team . Advised patient on obtaining a medical alert system . Provided patient with educational material on Healthy Meal  Planning  Patient Self Care Activities:  . Self administers oral medications as prescribed . Attends all scheduled provider appointments . Checks blood sugars as prescribed and utilize hyper and hypoglycemia protocol as needed . Adheres to prescribed ADA/carb modified . Call for Medical alert system  [place] will outreach within the month of October      .  COMPLETED: (THN)Learn More About My Health        Timeframe:  Short-Term Goal Priority:  Medium Start Date:    [id number]                         Expected End Date:    35009381                   Follow Up Date 01/18/2021   - ask questions - bring a list of my medicines to the visit - speak up when I don't understand    Why is this important?   The best way to  learn about your health and care is by talking to the doctor and nurse.  They will answer your questions and give you information in the way that you like best.    Notes:  Patient has met goal and is doing very well.    .  (THN)Monitor and Manage My Blood Sugar   On track     Timeframe:  Long-Range Goal Priority:  High Start Date:   [id number]                          Expected End Date:  17616073                    Follow Up Date [id number]   - check blood sugar at prescribed times - check blood sugar if I feel it is too high or too low - take the blood sugar log to all doctor visits - take the blood sugar meter to all doctor visits    Why is this important?   Checking your blood sugar at home helps to keep it from getting very high or very low.  Writing the results in a diary or log helps the doctor know how to care for you.  Your blood sugar log should have the time, date and the results.  Also, write down the amount of insulin or other medicine that you take.  Other information, like what you ate, exercise done and how you were feeling, will also be helpful.     Notes:  Managing her blood sugars and knowing what and how it affects her is what she is  learning    .  (THN)Perform Foot Care   On track     Timeframe:  Long-Range Goal Priority:  Medium Start Date:   85462703                          Expected End Date: 50093818                     Follow Up Date [id number]   - check feet daily for cuts, sores or redness - trim toenails straight across - wash and dry feet carefully every day - wear comfortable, cotton socks - wear comfortable, well-fitting shoes    Why is this important?   Good foot care is very important when you have diabetes.  There are many things you can do to keep your feet healthy and catch a problem early.    Notes: Foot care is on going process for diabetics     .  (THN)Set My Target A1C   On track     Timeframe:  Short-Term Goal Priority:  High Start Date:  67893810                           Expected End Date:  17510258         Follow Up Date [id number] - set target A1C    Why is this important?   Your target A1C is decided together by you and your doctor.  It is based on several things like your age and other health issues.    Notes:  A1C target set at 6.7. Patient has reached now trying to maintain Patient has exceeded her goal by A1C is now 6.6. Monitoring patient for maintaining

## 2020-12-22 ENCOUNTER — Ambulatory Visit (INDEPENDENT_AMBULATORY_CARE_PROVIDER_SITE_OTHER): Payer: Medicare Other

## 2020-12-22 DIAGNOSIS — J309 Allergic rhinitis, unspecified: Secondary | ICD-10-CM | POA: Diagnosis not present

## 2020-12-28 ENCOUNTER — Other Ambulatory Visit: Payer: Self-pay | Admitting: Adult Health

## 2020-12-28 DIAGNOSIS — G4700 Insomnia, unspecified: Secondary | ICD-10-CM

## 2020-12-29 ENCOUNTER — Other Ambulatory Visit: Payer: Self-pay | Admitting: Neurology

## 2020-12-29 ENCOUNTER — Other Ambulatory Visit: Payer: Self-pay | Admitting: Adult Health

## 2020-12-29 DIAGNOSIS — M810 Age-related osteoporosis without current pathological fracture: Secondary | ICD-10-CM

## 2020-12-30 ENCOUNTER — Telehealth: Payer: Self-pay | Admitting: Adult Health

## 2020-12-30 NOTE — Telephone Encounter (Signed)
Sent to the pharmacy by e-scribe.  Pt is scheduled for cpx.

## 2020-12-30 NOTE — Telephone Encounter (Signed)
SENT TO THE PHARMACY.  PT HAS AN PENDING CPX.

## 2020-12-30 NOTE — Telephone Encounter (Signed)
The patient is returning you call [phone]

## 2020-12-30 NOTE — Telephone Encounter (Signed)
Spoke to the pt and changed her follow up appointment to a physical.  Nothing further needed.

## 2021-01-01 ENCOUNTER — Ambulatory Visit (INDEPENDENT_AMBULATORY_CARE_PROVIDER_SITE_OTHER): Payer: Medicare Other | Admitting: *Deleted

## 2021-01-01 DIAGNOSIS — J309 Allergic rhinitis, unspecified: Secondary | ICD-10-CM | POA: Diagnosis not present

## 2021-01-12 ENCOUNTER — Ambulatory Visit: Payer: Medicare Other | Admitting: *Deleted

## 2021-01-12 ENCOUNTER — Ambulatory Visit (INDEPENDENT_AMBULATORY_CARE_PROVIDER_SITE_OTHER): Payer: Medicare Other

## 2021-01-12 DIAGNOSIS — J309 Allergic rhinitis, unspecified: Secondary | ICD-10-CM

## 2021-01-19 ENCOUNTER — Ambulatory Visit: Payer: Self-pay

## 2021-01-26 ENCOUNTER — Ambulatory Visit: Payer: Medicare Other | Admitting: Neurology

## 2021-01-26 ENCOUNTER — Ambulatory Visit (INDEPENDENT_AMBULATORY_CARE_PROVIDER_SITE_OTHER): Payer: Medicare Other | Admitting: *Deleted

## 2021-01-26 DIAGNOSIS — J309 Allergic rhinitis, unspecified: Secondary | ICD-10-CM

## 2021-01-30 ENCOUNTER — Other Ambulatory Visit: Payer: Self-pay | Admitting: Neurology

## 2021-02-09 ENCOUNTER — Ambulatory Visit (INDEPENDENT_AMBULATORY_CARE_PROVIDER_SITE_OTHER): Payer: Medicare Other | Admitting: *Deleted

## 2021-02-09 DIAGNOSIS — J309 Allergic rhinitis, unspecified: Secondary | ICD-10-CM | POA: Diagnosis not present

## 2021-02-10 ENCOUNTER — Other Ambulatory Visit: Payer: Self-pay

## 2021-02-11 ENCOUNTER — Ambulatory Visit (INDEPENDENT_AMBULATORY_CARE_PROVIDER_SITE_OTHER): Payer: Medicare Other | Admitting: Adult Health

## 2021-02-11 ENCOUNTER — Encounter: Payer: Self-pay | Admitting: Adult Health

## 2021-02-11 VITALS — BP 110/64 | Temp 98.3°F | Ht 64.0 in | Wt 132.0 lb

## 2021-02-11 DIAGNOSIS — E1169 Type 2 diabetes mellitus with other specified complication: Secondary | ICD-10-CM | POA: Diagnosis not present

## 2021-02-11 DIAGNOSIS — E785 Hyperlipidemia, unspecified: Secondary | ICD-10-CM

## 2021-02-11 DIAGNOSIS — G4700 Insomnia, unspecified: Secondary | ICD-10-CM

## 2021-02-11 DIAGNOSIS — M810 Age-related osteoporosis without current pathological fracture: Secondary | ICD-10-CM

## 2021-02-11 DIAGNOSIS — Z0000 Encounter for general adult medical examination without abnormal findings: Secondary | ICD-10-CM | POA: Diagnosis not present

## 2021-02-11 DIAGNOSIS — G6289 Other specified polyneuropathies: Secondary | ICD-10-CM

## 2021-02-11 DIAGNOSIS — E119 Type 2 diabetes mellitus without complications: Secondary | ICD-10-CM

## 2021-02-11 LAB — VITAMIN D 25 HYDROXY (VIT D DEFICIENCY, FRACTURES): VITD: 95.18 ng/mL (ref 30.00–100.00)

## 2021-02-11 LAB — COMPREHENSIVE METABOLIC PANEL
ALT: 11 U/L (ref 0–35)
AST: 14 U/L (ref 0–37)
Albumin: 4.3 g/dL (ref 3.5–5.2)
Alkaline Phosphatase: 48 U/L (ref 39–117)
BUN: 15 mg/dL (ref 6–23)
CO2: 30 mEq/L (ref 19–32)
Calcium: 10.2 mg/dL (ref 8.4–10.5)
Chloride: 102 mEq/L (ref 96–112)
Creatinine, Ser: 0.86 mg/dL (ref 0.40–1.20)
GFR: 67.01 mL/min (ref >60.00)
Glucose, Bld: 118 mg/dL — ABNORMAL HIGH (ref 70–99)
Potassium: 4.8 mEq/L (ref 3.5–5.1)
Sodium: 140 mEq/L (ref 135–145)
Total Bilirubin: 0.4 mg/dL (ref 0.2–1.2)
Total Protein: 7.4 g/dL (ref 6.0–8.3)

## 2021-02-11 LAB — CBC WITH DIFFERENTIAL/PLATELET
Basophils Absolute: 0 10*3/uL (ref 0.0–0.1)
Basophils Relative: 1.1 % (ref 0.0–3.0)
Eosinophils Absolute: 0.1 10*3/uL (ref 0.0–0.7)
Eosinophils Relative: 2.6 % (ref 0.0–5.0)
HCT: 41.2 % (ref 36.0–46.0)
Hemoglobin: 13.6 g/dL (ref 12.0–15.0)
Lymphocytes Relative: 27 % (ref 12.0–46.0)
Lymphs Abs: 0.9 10*3/uL (ref 0.7–4.0)
MCHC: 33 g/dL (ref 30.0–36.0)
MCV: 91.8 fl (ref 78.0–100.0)
Monocytes Absolute: 0.3 10*3/uL (ref 0.1–1.0)
Monocytes Relative: 9.6 % (ref 3.0–12.0)
Neutro Abs: 2 10*3/uL (ref 1.4–7.7)
Neutrophils Relative %: 59.7 % (ref 43.0–77.0)
Platelets: 231 10*3/uL (ref 150.0–400.0)
RBC: 4.49 Mil/uL (ref 3.87–5.11)
RDW: 13.4 % (ref 11.5–15.5)
WBC: 3.4 10*3/uL — ABNORMAL LOW (ref 4.0–10.5)

## 2021-02-11 LAB — LIPID PANEL
Cholesterol: 175 mg/dL (ref 0–200)
HDL: 69 mg/dL (ref >39.00)
LDL Cholesterol: 85 mg/dL (ref 0–99)
NonHDL: 106.14
Total CHOL/HDL Ratio: 3
Triglycerides: 104 mg/dL (ref 0.0–149.0)
VLDL: 20.8 mg/dL (ref 0.0–40.0)

## 2021-02-11 LAB — HEMOGLOBIN A1C: Hgb A1c MFr Bld: 7.2 % — ABNORMAL HIGH (ref 4.6–6.5)

## 2021-02-11 LAB — TSH: TSH: 1.13 u[IU]/mL (ref 0.35–4.50)

## 2021-02-11 NOTE — Progress Notes (Signed)
Subjective:    Patient ID: Kimberly Lam, female    DOB: 1946-05-02, 75 y.o.   MRN: [id number]  HPI Patient presents for yearly preventative medicine examination. She is a pleasant 75 year old female who  has a past medical history of Acid reflux, Allergic rhinitis, Allergy, Anemia, Arthritis, Asthma, Bilateral sciatica, Cataract, Chronic bilateral low back pain, Colon polyps, Diverticulosis, DM type 2 (diabetes mellitus, type 2) ([place]), Environmental allergies, GERD (gastroesophageal reflux disease), Hyperlipidemia, Low back pain, Migraine, Osteoporosis, and PONV (postoperative nausea and vomiting).  DM -currently prescribed Metformin 1000 mg extended release twice daily. She denies episodes of hypoglycemia.  She does take ramipril 2.5 mg for kidney protection Lab Results  Component Value Date   HGBA1C 6.6 (A) 11/11/2020   Hyperlipidemia-simvastatin 20 mg daily.  She denies myalgia or fatigue ` Lab Results  Component Value Date   CHOL 159 02/13/2020   HDL 58.50 02/13/2020   LDLCALC 78 02/13/2020   TRIG 111.0 02/13/2020   CHOLHDL 3 02/13/2020   Osteoporosis/Osteopenia  -takes Fosamax weekly and vitamin D 1000 mg daily. Last Bone Density was in 04/2020 which showed  AP Spine L1-L2       04/27/2020 72.6 -1.3 1.013 g/cm2 * AP Spine  L1-L2      08/18/2017    69.9         -2.0    0.926 g/cm2  DualFemur Neck Right 04/27/2020    72.6         -1.7    0.807 g/cm2 DualFemur Neck Right 08/18/2017    69.9         -1.9    0.777 g/cm2  DualFemur Total Mean 04/27/2020    72.6         -1.3    0.847 g/cm2 DualFemur Total Mean 08/18/2017    69.9         -1.1    0.863 g/cm2   Insomnia - takes Trazodone QHS PRN   Lumbar Radiculopathy - is managed by Neurology. Currently prescribed Gabapentin 300 mg TID. Some days are better than others. She will alternate how she takes her gabapentin. She may take two a day or take three a  Day depending on how her pain is that day.   All immunizations and  health maintenance protocols were reviewed with the patient and needed orders were placed. She is up to date on routine vaccinations   Appropriate screening laboratory values were ordered for the patient including screening of hyperlipidemia, renal function and hepatic function.  Medication reconciliation,  past medical history, social history, problem list and allergies were reviewed in detail with the patient  Goals were established with regard to weight loss, exercise, and  diet in compliance with medications  End of life planning was discussed.  She is up-to-date on routine colon cancer screening as well as breast cancer screening  Review of Systems  Constitutional: Negative.   HENT: Negative.   Eyes: Negative.   Respiratory: Negative.   Cardiovascular: Negative.   Gastrointestinal: Negative.   Endocrine: Negative.   Genitourinary: Negative.   Musculoskeletal: Positive for arthralgias and back pain.  Skin: Negative.   Allergic/Immunologic: Negative.   Neurological: Negative.   Hematological: Negative.   Psychiatric/Behavioral: Negative.    Past Medical History:  Diagnosis Date  . Acid reflux   . Allergic rhinitis   . Allergy   . Anemia   . Arthritis    per MD- pt states no s/s of this   . Asthma   .  Bilateral sciatica   . Cataract    removed both eyes   . Chronic bilateral low back pain   . Colon polyps   . Diverticulosis   . DM type 2 (diabetes mellitus, type 2) ([place])   . Environmental allergies    flowers, pollen, trees  . GERD (gastroesophageal reflux disease)   . Hyperlipidemia   . Low back pain   . Migraine    past hx- control as allergies are controlled   . Osteoporosis   . PONV (postoperative nausea and vomiting)     Social History   Socioeconomic History  . Marital status: Married    Spouse name: Not on file  . Number of children: 2  . Years of education: 3  . Highest education level: Not on file  Occupational History  . Occupation: Retired   Tobacco Use  . Smoking status: Never Smoker  . Smokeless tobacco: Never Used  Vaping Use  . Vaping Use: Never used  Substance and Sexual Activity  . Alcohol use: No    Alcohol/week: 0.0 standard drinks  . Drug use: No  . Sexual activity: Not on file  Other Topics Concern  . Not on file  Social History Narrative      Married 1 son one daughter, relocated to [place] after living in [place] for many years. She is a Writer of [employer].   No caffeine.          Lives at home with her husband and brother.   Right-handed.   No caffeine use.   Social Determinants of Health   Financial Resource Strain: Low Risk   . Difficulty of Paying Living Expenses: Not hard at all  Food Insecurity: Not on file  Transportation Needs: Not on file  Physical Activity: Insufficiently Active  . Days of Exercise per Week: 3 days  . Minutes of Exercise per Session: 30 min  Stress: No Stress Concern Present  . Feeling of Stress : Not at all  Social Connections: Moderately Integrated  . Frequency of Communication with Friends and Family: More than three times a week  . Frequency of Social Gatherings with Friends and Family: Once a week  . Attends Religious Services: More than 4 times per year  . Active Member of Clubs or Organizations: No  . Attends Archivist Meetings: Never  . Marital Status: Married  Human resources officer Violence: Not At Risk  . Fear of Current or Ex-Partner: No  . Emotionally Abused: No  . Physically Abused: No  . Sexually Abused: No    Past Surgical History:  Procedure Laterality Date  . CATARACT EXTRACTION, BILATERAL Bilateral 2012  . CESAREAN SECTION     x 2  . COLONOSCOPY     > 10 yrs ago- unsure if removed polyps as was told sev.different things after this colon   . DENTAL SURGERY     molars removed   . DILATION AND CURETTAGE OF UTERUS    . NASAL SINUS SURGERY    . TUBAL LIGATION      Family History  Problem Relation Age of  Onset  . Asthma Father   . Emphysema Father   . Alcohol abuse Father   . Diabetes Mellitus II Brother        x 2  . Hyperlipidemia Brother   . Hypertension Brother   . Pancreatic cancer Brother   . Prostate cancer Brother   . Diabetes Brother   . Heart disease Maternal  Grandmother        hardening of the arteries  . Heart murmur Daughter   . Glaucoma Brother   . Breast cancer Neg Hx   . Colon cancer Neg Hx   . Colon polyps Neg Hx   . Esophageal cancer Neg Hx   . Rectal cancer Neg Hx   . Stomach cancer Neg Hx     Allergies  Allergen Reactions  . Peanut-Containing Drug Products Anaphylaxis  . Eggs Or Egg-Derived Products Nausea Only    Nausea only- can eat cakes with eggs   . Milk-Related Compounds Nausea And Vomiting    Milk and cheese  . Actos [Pioglitazone] Nausea Only  . Soy Allergy Nausea Only    Pt states had allergy testing and was told allergic to soy-causes nausea but no other reactions  . Vicodin [Hydrocodone-Acetaminophen] Nausea Only    Current Outpatient Medications on File Prior to Visit  Medication Sig Dispense Refill  . Alcohol Swabs (B-D SINGLE USE SWABS REGULAR) PADS USE TO TEST BLOOD GLUCOSE FOUR TIMES DAILY 200 each 3  . alendronate (FOSAMAX) 70 MG tablet TAKE 1 TABLET BY MOUTH ONCE WEEKLY WITH A FULL GLASS OF WATER ON AN EMPTY STOMACH 12 tablet 0  . Ascorbic Acid (VITAMIN C) 1000 MG tablet Take 1,000 mg by mouth as needed.    . Azelastine HCl 0.15 % SOLN Use 1-2 sprays per nostril twice a day as needed for sinus drainage. 30 mL 5  . Barberry-Oreg Grape-Goldenseal (BERBERINE COMPLEX PO) Take by mouth daily.    . Blood Glucose Monitoring Suppl (ONETOUCH VERIO FLEX SYSTEM) w/Device KIT USE AS DIRECTED 1 kit 0  . Calcium Carbonate-Vitamin D (CALCIUM 600+D PO) Take by mouth.    . cholecalciferol (VITAMIN D) 1000 units tablet Take 1,000 Units by mouth daily.    Marland Kitchen CINNAMON PO Take 1 tablet by mouth as needed.    . Continuous Blood Gluc Sensor (FREESTYLE  LIBRE 2 SENSOR) MISC Use with Libre app 6 each 3  . Cranberry 450 MG CAPS Take 1 capsule by mouth as needed.    . Cyanocobalamin (VITAMIN B-12 CR) 1500 MCG TBCR Take 1 tablet by mouth as needed.    . Echinacea-Goldenseal (ECHINACEA COMB/GOLDEN SEAL PO) Take 1 tablet by mouth as needed. Reported on 12/16/2015    . EPINEPHrine (EPIPEN 2-PAK) 0.3 mg/0.3 mL IJ SOAJ injection Inject 0.3 mg into the muscle as needed for anaphylaxis. (Patient not taking: Reported on 09/07/2020)    . fluticasone (FLONASE) 50 MCG/ACT nasal spray USE 1 SPRAY IN BOTH  NOSTRILS TWICE DAILY 48 g 1  . gabapentin (NEURONTIN) 300 MG capsule TAKE 2 CAPSULES BY MOUTH 3  TIMES DAILY 540 capsule 0  . Green Tea, Camillia sinensis, (GREEN TEA PO) Take by mouth as needed. Reported on 12/16/2015    . ipratropium (ATROVENT) 0.06 % nasal spray USE 2 SPRAYS IN BOTH  NOSTRILS 3 TIMES DAILY AS  NEEDED FOR RHINITIS 90 mL 3  . Lactobacillus (ACIDOPHILUS PROBIOTIC PO) Take 1 tablet by mouth daily.    Elmore Guise Devices [facility] DELICA PLUS LANCING) MISC USE AS DIRECTED 1 each 1  . Lancets (ONETOUCH DELICA PLUS [id number]) MISC TEST IN THE MORNING , AT  NOON, IN THE EVENING AND AT BEDTIME 400 each 3  . levocetirizine (XYZAL) 5 MG tablet TAKE 1 TABLET BY MOUTH IN  THE EVENING 90 tablet 1  . MELATONIN ER PO Take by mouth.    . meloxicam (MOBIC) 7.5 MG tablet Take  1 tablet (7.5 mg total) by mouth daily. 30 tablet 1  . metFORMIN (GLUCOPHAGE-XR) 500 MG 24 hr tablet TAKE 2 TABLETS BY MOUTH  TWICE DAILY 360 tablet 0  . montelukast (SINGULAIR) 10 MG tablet TAKE 1 TABLET BY MOUTH AT  BEDTIME 90 tablet 3  . Omega-3 Fatty Acids (OMEGA 3 PO) Take 1 tablet by mouth as needed.    Marland Kitchen omeprazole (PRILOSEC) 40 MG capsule Take 40 mg by mouth daily.    Glory Rosebush VERIO test strip USE TO TEST BLOOD GLUCOSE 4 TIMES DAILY 400 strip 3  . Polyethyl Glycol-Propyl Glycol (SYSTANE FREE OP) Apply 1 drop to eye daily. Uses thera work for dr eyes per pt    . POTASSIUM PO Take  by mouth.    . ramipril (ALTACE) 2.5 MG capsule TAKE 1 CAPSULE BY MOUTH  DAILY 90 capsule 3  . simvastatin (ZOCOR) 20 MG tablet TAKE 1 TABLET BY MOUTH  DAILY 90 tablet 0  . Spacer/Aero-Holding Chambers (AEROCHAMBER PLUS) inhaler Use as instructed with MDI 1 each 2  . Stinging Nettle 2 % POWD Take 1 tablet by mouth as needed. Reported on 12/16/2015    . traZODone (DESYREL) 50 MG tablet TAKE 1/2 TO 1 TABLET BY  MOUTH AT BEDTIME AS NEEDED  FOR SLEEP 90 tablet 0  . TURMERIC PO Take 800 mg by mouth as needed. Reported on 12/16/2015    . VENTOLIN HFA 108 (90 Base) MCG/ACT inhaler Inhale 2 puffs into the lungs every 6 (six) hours as needed for wheezing or shortness of breath.     No current facility-administered medications on file prior to visit.    There were no vitals taken for this visit.      Objective:   Physical Exam Vitals and nursing note reviewed.  Constitutional:      General: She is not in acute distress.    Appearance: Normal appearance. She is well-developed. She is not ill-appearing.  HENT:     Head: Normocephalic and atraumatic.     Right Ear: Tympanic membrane, ear canal and external ear normal. There is no impacted cerumen.     Left Ear: Tympanic membrane, ear canal and external ear normal. There is no impacted cerumen.     Nose: Nose normal. No congestion or rhinorrhea.     Mouth/Throat:     Mouth: Mucous membranes are moist.     Pharynx: Oropharynx is clear. No oropharyngeal exudate or posterior oropharyngeal erythema.  Eyes:     General:        Right eye: No discharge.        Left eye: No discharge.     Extraocular Movements: Extraocular movements intact.     Conjunctiva/sclera: Conjunctivae normal.     Pupils: Pupils are equal, round, and reactive to light.  Neck:     Thyroid: No thyromegaly.     Vascular: No carotid bruit.     Trachea: No tracheal deviation.  Cardiovascular:     Rate and Rhythm: Normal rate and regular rhythm.     Pulses: Normal pulses.      Heart sounds: Normal heart sounds. No murmur heard. No friction rub. No gallop.   Pulmonary:     Effort: Pulmonary effort is normal. No respiratory distress.     Breath sounds: Normal breath sounds. No stridor. No wheezing, rhonchi or rales.  Chest:     Chest wall: No tenderness.  Abdominal:     General: Abdomen is flat. Bowel sounds are normal. There  is no distension.     Palpations: Abdomen is soft. There is no mass.     Tenderness: There is no abdominal tenderness. There is no right CVA tenderness, left CVA tenderness, guarding or rebound.     Hernia: No hernia is present.  Musculoskeletal:        General: No swelling, tenderness, deformity or signs of injury. Normal range of motion.     Cervical back: Normal range of motion and neck supple.     Right lower leg: No edema.     Left lower leg: No edema.  Lymphadenopathy:     Cervical: No cervical adenopathy.  Skin:    General: Skin is warm and dry.     Coloration: Skin is not jaundiced or pale.     Findings: No bruising, erythema, lesion or rash.  Neurological:     General: No focal deficit present.     Mental Status: She is alert and oriented to person, place, and time.     Cranial Nerves: No cranial nerve deficit.     Sensory: No sensory deficit.     Motor: No weakness.     Coordination: Coordination normal.     Gait: Gait normal.     Deep Tendon Reflexes: Reflexes normal.  Psychiatric:        Mood and Affect: Mood normal.        Behavior: Behavior normal.        Thought Content: Thought content normal.        Judgment: Judgment normal.       Assessment & Plan:  1. Routine general medical examination at a health care facility  - CBC with Differential/Platelet; Future - Comprehensive metabolic panel; Future - Hemoglobin A1c; Future - Lipid panel; Future - TSH; Future - VITAMIN D 25 Hydroxy (Vit-D Deficiency, Fractures); Future  2. Diabetes mellitus without complication ([place]) - Consider adding agent  - Follow up  in 3-6 months depending on A1c - CBC with Differential/Platelet; Future - Comprehensive metabolic panel; Future - Hemoglobin A1c; Future - Lipid panel; Future - TSH; Future - VITAMIN D 25 Hydroxy (Vit-D Deficiency, Fractures); Future  3. Hyperlipidemia associated with type 2 diabetes mellitus ([place]) - Consider increasing statin  - CBC with Differential/Platelet; Future - Comprehensive metabolic panel; Future - Hemoglobin A1c; Future - Lipid panel; Future - TSH; Future - VITAMIN D 25 Hydroxy (Vit-D Deficiency, Fractures); Future  4. Insomnia, unspecified type - Continue with Trazodone PRN  - CBC with Differential/Platelet; Future - Comprehensive metabolic panel; Future - Hemoglobin A1c; Future - Lipid panel; Future - TSH; Future - VITAMIN D 25 Hydroxy (Vit-D Deficiency, Fractures); Future  5. Age-related osteoporosis without current pathological fracture - Discussed taking a Fosamax vacation since she has been on Fosamax for 5+ years and she has a low fracture risk. She would like to stay on this medication for the time being  - CBC with Differential/Platelet; Future - Comprehensive metabolic panel; Future - Hemoglobin A1c; Future - Lipid panel; Future - TSH; Future - VITAMIN D 25 Hydroxy (Vit-D Deficiency, Fractures); Future  6. Other polyneuropathy - Continue with Gabapentin and follow up with Neurology as directed  - CBC with Differential/Platelet; Future - Comprehensive metabolic panel; Future - Hemoglobin A1c; Future - Lipid panel; Future - TSH; Future - VITAMIN D 25 Hydroxy (Vit-D Deficiency, Fractures); Future   [employer]

## 2021-02-12 ENCOUNTER — Other Ambulatory Visit: Payer: Self-pay | Admitting: Adult Health

## 2021-02-16 DIAGNOSIS — J3089 Other allergic rhinitis: Secondary | ICD-10-CM | POA: Diagnosis not present

## 2021-02-16 NOTE — Progress Notes (Signed)
VIALS EXP 02-16-22 

## 2021-02-17 DIAGNOSIS — J302 Other seasonal allergic rhinitis: Secondary | ICD-10-CM | POA: Diagnosis not present

## 2021-02-18 DIAGNOSIS — J301 Allergic rhinitis due to pollen: Secondary | ICD-10-CM | POA: Diagnosis not present

## 2021-02-24 ENCOUNTER — Ambulatory Visit (INDEPENDENT_AMBULATORY_CARE_PROVIDER_SITE_OTHER): Payer: Medicare Other

## 2021-02-24 DIAGNOSIS — J309 Allergic rhinitis, unspecified: Secondary | ICD-10-CM

## 2021-02-26 ENCOUNTER — Other Ambulatory Visit: Payer: Self-pay

## 2021-02-26 MED ORDER — AZELASTINE HCL 0.1 % NA SOLN
NASAL | 1 refills | Status: DC
Start: 2021-02-26 — End: 2021-05-12

## 2021-03-08 ENCOUNTER — Encounter: Payer: Self-pay | Admitting: Allergy

## 2021-03-08 ENCOUNTER — Ambulatory Visit (INDEPENDENT_AMBULATORY_CARE_PROVIDER_SITE_OTHER): Payer: Medicare Other | Admitting: Allergy

## 2021-03-08 ENCOUNTER — Ambulatory Visit: Payer: Self-pay

## 2021-03-08 ENCOUNTER — Other Ambulatory Visit: Payer: Self-pay

## 2021-03-08 VITALS — BP 112/68 | HR 87 | Resp 16

## 2021-03-08 DIAGNOSIS — J3489 Other specified disorders of nose and nasal sinuses: Secondary | ICD-10-CM | POA: Diagnosis not present

## 2021-03-08 DIAGNOSIS — T7800XD Anaphylactic reaction due to unspecified food, subsequent encounter: Secondary | ICD-10-CM | POA: Diagnosis not present

## 2021-03-08 DIAGNOSIS — K219 Gastro-esophageal reflux disease without esophagitis: Secondary | ICD-10-CM

## 2021-03-08 DIAGNOSIS — J3089 Other allergic rhinitis: Secondary | ICD-10-CM

## 2021-03-08 DIAGNOSIS — J309 Allergic rhinitis, unspecified: Secondary | ICD-10-CM

## 2021-03-08 DIAGNOSIS — J4540 Moderate persistent asthma, uncomplicated: Secondary | ICD-10-CM

## 2021-03-08 NOTE — Assessment & Plan Note (Signed)
Past history - Felt better with [facility] but could not afford to purchase after samples ran out. Reports that inhalers cause her mouth to get sores and become white Interim history - stable with no inhaler use.   ACT score 25.  Daily controller medication(s): Continue Singulair 10mg  daily.   May use albuterol rescue inhaler 2 puffs or nebulizer every 4 to 6 hours as needed for shortness of breath, chest tightness, coughing, and wheezing. May use albuterol rescue inhaler 2 puffs 5 to 15 minutes prior to strenuous physical activities. Monitor frequency of use.

## 2021-03-08 NOTE — Progress Notes (Signed)
Follow Up Note  RE: Kimberly Lam MRN: [id number] DOB: [birth year] Date of Office Visit: 03/08/2021  Referring provider: [name], NP Primary care provider: [name], NP  Chief Complaint: Allergic Rhinitis  and Asthma  History of Present Illness: I had the pleasure of seeing Kimberly Lam for a follow up visit at the [facility] on 03/09/2021. She is a 75 y.o. female, who is being followed for asthma, allergic rhinoconjunctivitis on AIT, GERD, nasal septal perforation and food allergy. Her previous allergy office visit was on 09/07/2020 with Dr. Maudie Mercury. Today is a regular follow up visit.  Moderate persistent asthma ACT score 25 Denies any SOB, coughing, wheezing, chest tightness, nocturnal awakenings, ER/urgent care visits or prednisone use since the last visit. No inhaler use since the last visit.   Perennial and seasonal allergic rhinoconjunctivitis Currently on allergy injections every 2 weeks with good benefit. Patient tried to stop before but then she develops headaches.  Using Flonase daily with either Atrovent and azelastine twice a day. Sometimes she is using up to 2-3 sprays per nostril at times due to significant nasal congestion.  Patient is scheduled to see ENT.   Taking Singulair daily and allegra prn.   Gastroesophageal reflux disease Takes omeprazole only if needed.  Usually triggered by the foods she eats.  Anaphylactic shock due to adverse food reaction Currently avoiding peanuts, cow's milk and soy.  Assessment and Plan: Jodene is a 75 y.o. female with: Moderate persistent asthma, uncomplicated Past history - Felt better with [facility] but could not afford to purchase after samples ran out. Reports that inhalers cause her mouth to get sores and become white Interim history - stable with no inhaler use.   ACT score 25.  Daily controller medication(s): Continue Singulair 10mg  daily.   May use albuterol rescue inhaler 2 puffs  or nebulizer every 4 to 6 hours as needed for shortness of breath, chest tightness, coughing, and wheezing. May use albuterol rescue inhaler 2 puffs 5 to 15 minutes prior to strenuous physical activities. Monitor frequency of use.   Perennial and seasonal allergic rhinoconjunctivitis Past history - started AIT on 10/27/2015 (TREE/GRASS-WEED-MITE-CAT-DOG/MOLD-CR).  Interim history - doing well with below regimen. Unable to go more than 2 weeks without injections.   Continue environmental control measures.  Continue allergy injections every 2 weeks - given today.  May use over the counter antihistamines such as Zyrtec (cetirizine), Claritin (loratadine), Allegra (fexofenadine) as needed in the morning.   USE saline lavage before use the nasal sprays in the morning and at night.  May use Flonase (fluticasone) nasal spray 1 spray per nostril twice a day as needed for nasal congestion.   May use Atrovent 2 sprays in each nostril 3 times a day as needed for drainage.   Continue montelukast 10mg  daily.  Nasal septal perforation Past history - Saw ENT in the past (no intervention). Interim history - This has been bothering her more.   Follow up with ENT.   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 Interim history - No reactions since last visit.   Continue to avoid peanuts, cow's milk, soybean.  For mild symptoms you can take over the counter antihistamines such as Benadryl and monitor symptoms closely. If symptoms worsen or if you have severe symptoms including breathing issues, throat closure, significant swelling, whole body hives, severe diarrhea and vomiting, lightheadedness then seek immediate medical care.  Gastroesophageal reflux disease Stable.  Continue taking omeprazole $RemoveBeforeDEI'40mg'[id number]$  daily as needed.  Return in about 6 months (around 09/08/2021).  No orders of the defined types were placed in this encounter.  Lab Orders   No laboratory test(s) ordered today    Diagnostics: None  Medication List:  Current Outpatient Medications  Medication Sig Dispense Refill  . Alcohol Swabs (B-D SINGLE USE SWABS REGULAR) PADS USE TO TEST BLOOD GLUCOSE FOUR TIMES DAILY 200 each 3  . alendronate (FOSAMAX) 70 MG tablet TAKE 1 TABLET BY MOUTH ONCE WEEKLY WITH A FULL GLASS OF WATER ON AN EMPTY STOMACH 12 tablet 0  . Ascorbic Acid (VITAMIN C) 1000 MG tablet Take 1,000 mg by mouth as needed.    Marland Kitchen azelastine (ASTELIN) 0.1 % nasal spray 1-2 sprays per nostril twice daily for runny nose 90 mL 1  . Azelastine HCl 0.15 % SOLN Use 1-2 sprays per nostril twice a day as needed for sinus drainage. 30 mL 5  . Barberry-Oreg Grape-Goldenseal (BERBERINE COMPLEX PO) Take by mouth daily.    . Blood Glucose Monitoring Suppl (ONETOUCH VERIO FLEX SYSTEM) w/Device KIT USE AS DIRECTED 1 kit 0  . CINNAMON PO Take 1 tablet by mouth as needed.    . Continuous Blood Gluc Sensor (FREESTYLE LIBRE 2 SENSOR) MISC Use with Libre app 6 each 3  . Cranberry 450 MG CAPS Take 1 capsule by mouth as needed.    . Cyanocobalamin (VITAMIN B-12 CR) 1500 MCG TBCR Take 1 tablet by mouth as needed.    . Echinacea-Goldenseal (ECHINACEA COMB/GOLDEN SEAL PO) Take 1 tablet by mouth as needed. Reported on 12/16/2015    . EPINEPHrine 0.3 mg/0.3 mL IJ SOAJ injection Inject 0.3 mg into the muscle as needed for anaphylaxis.    . fluticasone (FLONASE) 50 MCG/ACT nasal spray USE 1 SPRAY IN BOTH  NOSTRILS TWICE DAILY 48 g 1  . gabapentin (NEURONTIN) 300 MG capsule TAKE 2 CAPSULES BY MOUTH 3  TIMES DAILY 540 capsule 0  . Green Tea, Camillia sinensis, (GREEN TEA PO) Take by mouth as needed. Reported on 12/16/2015    . ipratropium (ATROVENT) 0.06 % nasal spray USE 2 SPRAYS IN BOTH  NOSTRILS 3 TIMES DAILY AS  NEEDED FOR RHINITIS 90 mL 3  . Lactobacillus (ACIDOPHILUS PROBIOTIC PO) Take 1 tablet by mouth daily.    Elmore Guise Devices [facility] DELICA PLUS LANCING) MISC USE AS DIRECTED 1  each 1  . Lancets (ONETOUCH DELICA PLUS [id number]) MISC TEST IN THE MORNING , AT  NOON, IN THE EVENING AND AT BEDTIME 400 each 3  . levocetirizine (XYZAL) 5 MG tablet TAKE 1 TABLET BY MOUTH IN  THE EVENING 90 tablet 1  . MELATONIN ER PO Take by mouth.    . metFORMIN (GLUCOPHAGE-XR) 500 MG 24 hr tablet TAKE 2 TABLETS BY MOUTH  TWICE DAILY 360 tablet 0  . montelukast (SINGULAIR) 10 MG tablet TAKE 1 TABLET BY MOUTH AT  BEDTIME 90 tablet 3  . Omega-3 Fatty Acids (OMEGA 3 PO) Take 1 tablet by mouth as needed.    Glory Rosebush VERIO test strip USE TO TEST BLOOD GLUCOSE 4 TIMES DAILY 400 strip 3  . Polyethyl Glycol-Propyl Glycol (SYSTANE FREE OP) Apply 1 drop to eye daily. Uses thera work for dr eyes per pt    . POTASSIUM PO Take by mouth.    . ramipril (ALTACE) 2.5 MG capsule TAKE 1 CAPSULE BY MOUTH  DAILY 90 capsule 3  . simvastatin (ZOCOR) 20 MG tablet TAKE 1 TABLET BY MOUTH  DAILY 90 tablet 0  . Stinging Nettle 2 % POWD Take 1 tablet by mouth as needed. Reported on 12/16/2015    . traZODone (DESYREL) 50 MG tablet TAKE 1/2 TO 1 TABLET BY  MOUTH AT BEDTIME AS NEEDED  FOR SLEEP 90 tablet 0  . TURMERIC PO Take 800 mg by mouth as needed. Reported on 12/16/2015    . Calcium Carbonate-Vitamin D (CALCIUM 600+D PO) Take by mouth. (Patient not taking: Reported on 03/08/2021)    . meloxicam (MOBIC) 7.5 MG tablet Take 1 tablet (7.5 mg total) by mouth daily. (Patient not taking: Reported on 03/08/2021) 30 tablet 1  . omeprazole (PRILOSEC) 40 MG capsule Take 40 mg by mouth daily. (Patient not taking: Reported on 03/08/2021)    . Spacer/Aero-Holding Chambers (AEROCHAMBER PLUS) inhaler Use as instructed with MDI (Patient not taking: Reported on 03/08/2021) 1 each 2  . VENTOLIN HFA 108 (90 Base) MCG/ACT inhaler Inhale 2 puffs into the lungs every 6 (six) hours as needed for wheezing or shortness of breath. (Patient not taking: Reported on 03/08/2021)     No current facility-administered medications for this visit.    Allergies: Allergies  Allergen Reactions  . Peanut-Containing Drug Products Anaphylaxis  . Eggs Or Egg-Derived Products Nausea Only    Nausea only- can eat cakes with eggs   . Milk-Related Compounds Nausea And Vomiting    Milk and cheese  . Actos [Pioglitazone] Nausea Only  . Soy Allergy Nausea Only    Pt states had allergy testing and was told allergic to soy-causes nausea but no other reactions  . Vicodin [Hydrocodone-Acetaminophen] Nausea Only   I reviewed her past medical history, social history, family history, and environmental history and no significant changes have been reported from her previous visit.  Review of Systems  Constitutional: Negative for appetite change, chills, fever and unexpected weight change.  HENT: Positive for congestion. Negative for postnasal drip and rhinorrhea.   Eyes: Negative for itching.  Respiratory: Negative for cough, chest tightness, shortness of breath and wheezing.   Cardiovascular: Negative for chest pain.  Gastrointestinal: Negative for abdominal pain.  Genitourinary: Negative for difficulty urinating and dysuria.  Skin: Negative for rash.  Allergic/Immunologic: Positive for environmental allergies and food allergies.  Neurological: Negative for headaches.   Objective: BP 112/68   Pulse 87   Resp 16   SpO2 96%  There is no height or weight on file to calculate BMI. Physical Exam Vitals and nursing note reviewed.  Constitutional:      Appearance: Normal appearance. She is well-developed.  HENT:     Head: Normocephalic and atraumatic.     Right Ear: External ear normal.     Left Ear: External ear normal.     Nose:     Comments: Septal perforation present    Mouth/Throat:     Mouth: Mucous membranes are moist.     Pharynx: Oropharynx is clear.  Eyes:     Conjunctiva/sclera: Conjunctivae normal.  Cardiovascular:     Rate and Rhythm: Normal rate and regular rhythm.     Heart sounds: Normal heart sounds. No murmur  heard.   Pulmonary:     Effort: Pulmonary effort is normal.     Breath sounds: Normal breath sounds. No wheezing, rhonchi or rales.  Musculoskeletal:     Cervical back: Neck supple.  Skin:    General: Skin is warm.     Findings: No rash.  Neurological:     Mental Status: She is alert and oriented  to person, place, and time.  Psychiatric:        Behavior: Behavior normal.    Previous notes and tests were reviewed. The plan was reviewed with the patient/family, and all questions/concerned were addressed.  It was my pleasure to see Kimberly Lam today and participate in her care. Please feel free to contact me with any questions or concerns.  Sincerely,  Rexene Alberts, DO Allergy & Immunology  [facility] office: [place] office: [phone]

## 2021-03-08 NOTE — Patient Instructions (Addendum)
Perennial and seasonal allergic rhinoconjunctivitis  Continue environmental control measures.  Continue allergy injections every 2 weeks.   May use over the counter antihistamines such as Zyrtec (cetirizine), Claritin (loratadine), Allegra (fexofenadine) as needed in the morning.    USE saline lavage before use the nasal sprays in the morning and at night.  May use Flonase (fluticasone) nasal spray 1 spray per nostril twice a day as needed for nasal congestion.   May use Atrovent 2 sprays in each nostril 3 times a day as needed for drainage.    Continue montelukast 10mg  daily.  Asthma:  Daily controller medication(s): Continue Singulair 10mg  daily.   May use albuterol rescue inhaler 2 puffs or nebulizer every 4 to 6 hours as needed for shortness of breath, chest tightness, coughing, and wheezing. May use albuterol rescue inhaler 2 puffs 5 to 15 minutes prior to strenuous physical activities. Monitor frequency of use.   Nasal septal perforation  Monitor symptoms.    Follow up with ENT.   Gastroesophageal reflux disease  Continue taking omeprazole 40mg  daily as needed.  Anaphylactic shock due to adverse food reaction  Continue to avoid peanuts, cow's milk, soybean.  For mild symptoms you can take over the counter antihistamines such as Benadryl and monitor symptoms closely. If symptoms worsen or if you have severe symptoms including breathing issues, throat closure, significant swelling, whole body hives, severe diarrhea and vomiting, lightheadedness then seek immediate medical care.  Follow up in 6 months or sooner if needed.

## 2021-03-08 NOTE — Assessment & Plan Note (Addendum)
Past history - started AIT on 10/27/2015 (TREE/GRASS-WEED-MITE-CAT-DOG/MOLD-CR).  Interim history - doing well with below regimen. Unable to go more than 2 weeks without injections.   Continue environmental control measures.  Continue allergy injections every 2 weeks - given today.  May use over the counter antihistamines such as Zyrtec (cetirizine), Claritin (loratadine), Allegra (fexofenadine) as needed in the morning.   USE saline lavage before use the nasal sprays in the morning and at night.  May use Flonase (fluticasone) nasal spray 1 spray per nostril twice a day as needed for nasal congestion.   May use Atrovent 2 sprays in each nostril 3 times a day as needed for drainage.   Continue montelukast 10mg  daily.

## 2021-03-09 NOTE — Assessment & Plan Note (Signed)
Past history - Saw ENT in the past (no intervention). Interim history - This has been bothering her more.   Follow up with ENT.

## 2021-03-09 NOTE — Assessment & Plan Note (Signed)
Stable.   Continue taking omeprazole 40mg  daily as needed.

## 2021-03-09 NOTE — Assessment & Plan Note (Signed)
Past history - soy caused increased rhinitis symptoms. Interim history - No reactions since last visit.   Continue to avoid peanuts, cow's milk, soybean.  For mild symptoms you can take over the counter antihistamines such as Benadryl and monitor symptoms closely. If symptoms worsen or if you have severe symptoms including breathing issues, throat closure, significant swelling, whole body hives, severe diarrhea and vomiting, lightheadedness then seek immediate medical care.

## 2021-03-15 ENCOUNTER — Other Ambulatory Visit: Payer: Self-pay | Admitting: Adult Health

## 2021-03-15 ENCOUNTER — Other Ambulatory Visit: Payer: Self-pay | Admitting: Allergy

## 2021-03-15 DIAGNOSIS — J309 Allergic rhinitis, unspecified: Secondary | ICD-10-CM

## 2021-03-15 DIAGNOSIS — M810 Age-related osteoporosis without current pathological fracture: Secondary | ICD-10-CM

## 2021-03-15 DIAGNOSIS — G4700 Insomnia, unspecified: Secondary | ICD-10-CM

## 2021-03-22 ENCOUNTER — Other Ambulatory Visit: Payer: Self-pay | Admitting: *Deleted

## 2021-03-23 ENCOUNTER — Ambulatory Visit (INDEPENDENT_AMBULATORY_CARE_PROVIDER_SITE_OTHER): Payer: Medicare Other | Admitting: *Deleted

## 2021-03-23 DIAGNOSIS — J309 Allergic rhinitis, unspecified: Secondary | ICD-10-CM

## 2021-03-25 NOTE — Patient Outreach (Signed)
[facility]) Care Management  [place]  03/25/2021   Kimberly Lam 1946/12/05 [id number]  RN Health Coach telephone call to patient.  H[employer] compliance verified. Per patient she is doing good. Patient had not checked her blood sugar yet. Patient A1C has increased from 6.6 to 7.2. Per patient she stated she had gotten off her diet some. Patient stated she was not eating 5-6 meals a day. Per patient she stated that she ate 2 meals a day. Patient stated that she was exercising. She is walking 3 times a day when weather permits. Patient has agreed to further outreach calls.  Encounter Medications:  Outpatient Encounter Medications as of 03/22/2021  Medication Sig Note  . Alcohol Swabs (B-D SINGLE USE SWABS REGULAR) PADS USE TO TEST BLOOD GLUCOSE FOUR TIMES DAILY   . alendronate (FOSAMAX) 70 MG tablet TAKE 1 TABLET BY MOUTH  WEEKLY WITH 8 OZ OF PLAIN  WATER 30 MINUTES BEFORE  FIRST FOOD, DRINK OR MEDS.  STAY UPRIGHT FOR 30 MINS   . Ascorbic Acid (VITAMIN C) 1000 MG tablet Take 1,000 mg by mouth as needed. 04/11/2019: Patient states that she takes it daily   . azelastine (ASTELIN) 0.1 % nasal spray 1-2 sprays per nostril twice daily for runny nose   . Azelastine HCl 0.15 % SOLN Use 1-2 sprays per nostril twice a day as needed for sinus drainage.   Jolyne Loa Grape-Goldenseal (BERBERINE COMPLEX PO) Take by mouth daily.   . Blood Glucose Monitoring Suppl ([place]) w/Device KIT USE AS DIRECTED   . Calcium Carbonate-Vitamin D (CALCIUM 600+D PO) Take by mouth. (Patient not taking: Reported on 03/08/2021)   . CINNAMON PO Take 1 tablet by mouth as needed.   . Continuous Blood Gluc Sensor (FREESTYLE LIBRE 2 SENSOR) MISC Use with [employer]   . Cranberry 450 MG CAPS Take 1 capsule by mouth as needed. 04/11/2019: Patient takes it daily   . Cyanocobalamin (VITAMIN B-12 CR) 1500 MCG TBCR Take 1 tablet by mouth as needed. 04/11/2019: Patient takes once daily   .  Echinacea-Goldenseal (ECHINACEA COMB/GOLDEN SEAL PO) Take 1 tablet by mouth as needed. Reported on 12/16/2015 03/14/2019: PRN   . EPINEPHrine 0.3 mg/0.3 mL IJ SOAJ injection Inject 0.3 mg into the muscle as needed for anaphylaxis.   . fluticasone (FLONASE) 50 MCG/ACT nasal spray USE 1 SPRAY IN BOTH  NOSTRILS TWICE DAILY   . gabapentin (NEURONTIN) 300 MG capsule TAKE 2 CAPSULES BY MOUTH 3  TIMES DAILY   . Green Tea, Camillia sinensis, (GREEN TEA PO) Take by mouth as needed. Reported on 12/16/2015 03/14/2019: PRN  . ipratropium (ATROVENT) 0.06 % nasal spray USE 2 SPRAYS IN BOTH  NOSTRILS 3 TIMES DAILY AS  NEEDED FOR RHINITIS   . Lactobacillus (ACIDOPHILUS PROBIOTIC PO) Take 1 tablet by mouth daily.   Elmore Guise Devices [facility] DELICA PLUS LANCING) MISC USE AS DIRECTED   . Lancets (ONETOUCH DELICA PLUS [id number]) MISC TEST IN THE MORNING , AT  NOON, IN THE EVENING AND AT BEDTIME   . levocetirizine (XYZAL) 5 MG tablet TAKE 1 TABLET BY MOUTH IN  THE EVENING   . MELATONIN ER PO Take by mouth.   . meloxicam (MOBIC) 7.5 MG tablet Take 1 tablet (7.5 mg total) by mouth daily. (Patient not taking: Reported on 03/08/2021)   . metFORMIN (GLUCOPHAGE-XR) 500 MG 24 hr tablet TAKE 2 TABLETS BY MOUTH  TWICE DAILY   . montelukast (SINGULAIR) 10 MG tablet TAKE  1 TABLET BY MOUTH AT  BEDTIME   . Omega-3 Fatty Acids (OMEGA 3 PO) Take 1 tablet by mouth as needed.   Marland Kitchen omeprazole (PRILOSEC) 40 MG capsule Take 40 mg by mouth daily. (Patient not taking: Reported on 03/08/2021)   . ONETOUCH VERIO test strip USE TO TEST BLOOD GLUCOSE 4 TIMES DAILY   . Polyethyl Glycol-Propyl Glycol (SYSTANE FREE OP) Apply 1 drop to eye daily. Uses thera work for dr eyes per pt   . POTASSIUM PO Take by mouth.   . ramipril (ALTACE) 2.5 MG capsule TAKE 1 CAPSULE BY MOUTH  DAILY   . simvastatin (ZOCOR) 20 MG tablet TAKE 1 TABLET BY MOUTH  DAILY   . Spacer/Aero-Holding Chambers (AEROCHAMBER PLUS) inhaler Use as instructed with MDI (Patient not  taking: Reported on 03/08/2021)   . Stinging Nettle 2 % POWD Take 1 tablet by mouth as needed. Reported on 12/16/2015   . traZODone (DESYREL) 50 MG tablet TAKE 1/2 TO 1 TABLET BY  MOUTH AT BEDTIME AS NEEDED  FOR SLEEP   . TURMERIC PO Take 800 mg by mouth as needed. Reported on 12/16/2015 04/11/2019: Patient takes dialy  . VENTOLIN HFA 108 (90 Base) MCG/ACT inhaler Inhale 2 puffs into the lungs every 6 (six) hours as needed for wheezing or shortness of breath. (Patient not taking: Reported on 03/08/2021)    No facility-administered encounter medications on file as of 03/22/2021.    Functional Status:  In your present state of health, do you have any difficulty performing the following activities: 09/03/2020  Hearing? Y  Comment has had hearing test and does need hearing aids  Vision? N  Difficulty concentrating or making decisions? N  Walking or climbing stairs? N  Dressing or bathing? N  Doing errands, shopping? N  Preparing Food and eating ? N  Using the Toilet? N  In the past six months, have you accidently leaked urine? N  Do you have problems with loss of bowel control? N  Managing your Medications? N  Managing your Finances? N  Housekeeping or managing your Housekeeping? N  Some recent data might be hidden    Fall/Depression Screening: Fall Risk  03/22/2021 02/11/2021 12/21/2020  Falls in the past year? 0 0 0  Number falls in past yr: 0 - 0  Injury with Fall? 0 - 0  Risk for fall due to : - - -  Follow up Falls evaluation completed - Falls evaluation completed   PHQ 2/9 Scores 02/11/2021 09/03/2020 01/14/2020 04/11/2019 03/12/2019 10/18/2018 06/13/2017  PHQ - 2 Score 0 0 0 0 0 0 0  PHQ- 9 Score - 0 - - - - -    Assessment:  Goals Addressed            This Visit's Progress   . (THN)(Obtain Eye Exam   On track    Timeframe:  Long-Range Goal Priority:  Medium Start Date:    [id number]                         Expected End Date:   [id number]            Follow Up Date [id number]   -  schedule appointment with eye doctor    Why is this important?   Eye check-ups are important when you have diabetes.  Vision loss can be prevented.    Notes:  Last eye exam was [id number]    . (THN)Monitor and Manage My Blood Sugar  On track    Timeframe:  Long-Range Goal Priority:  High Start Date:   [id number]                          Expected End Date:  [id number]             Follow Up Date [id number]   - check blood sugar at prescribed times - check blood sugar if I feel it is too high or too low - take the blood sugar log to all doctor visits - take the blood sugar meter to all doctor visits    Why is this important?   Checking your blood sugar at home helps to keep it from getting very high or very low.  Writing the results in a diary or log helps the doctor know how to care for you.  Your blood sugar log should have the time, date and the results.  Also, write down the amount of insulin or other medicine that you take.  Other information, like what you ate, exercise done and how you were feeling, will also be helpful.     Notes:  Managing her blood sugars and knowing what and how it affects her is what she is learning    . (THN)Perform Foot Care   On track    Timeframe:  Long-Range Goal Priority:  Medium Start Date:   [id number]                          Expected End Date: [id number]                  Follow Up Date [id number]   - check feet daily for cuts, sores or redness - trim toenails straight across - wash and dry feet carefully every day - wear comfortable, cotton socks - wear comfortable, well-fitting shoes    Why is this important?   Good foot care is very important when you have diabetes.  There are many things you can do to keep your feet healthy and catch a problem early.    Notes: Foot care is on going process for diabetics     . (THN)Set My Target A1C   On track    Timeframe:  Short-Term Goal Priority:  High Start Date:  12751700                            Expected End Date:  [id number] Follow Up Date [id number] - set target A1C    Why is this important?   Your target A1C is decided together by you and your doctor.  It is based on several things like your age and other health issues.    Notes:  A1C target set at 6.7. Patient has reached now trying to maintain Patient has exceeded her goal by A1C is now 6.6. Monitoring patient for maintaining [id number] Patient A1C has increased from 06.6 to 7.2       Plan:  Follow-up:  Patient agrees to Care Plan and Follow-up.  RN ordered Pharmacologist sent educational information on [employer] nutrition RN provided glucerna coupons RN will follow up within the month of October RN sent update assessment to PCP  [place] [facility] [phone]

## 2021-03-25 NOTE — Patient Instructions (Signed)
Goals Addressed            This Visit's Progress   . (THN)(Obtain Eye Exam   On track    Timeframe:  Long-Range Goal Priority:  Medium Start Date:    [id number]                         Expected End Date:   [id number]            Follow Up Date [id number]   - schedule appointment with eye doctor    Why is this important?   Eye check-ups are important when you have diabetes.  Vision loss can be prevented.    Notes:  Last eye exam was [id number]    . (THN)Monitor and Manage My Blood Sugar   On track    Timeframe:  Long-Range Goal Priority:  High Start Date:   [id number]                          Expected End Date:  [id number]             Follow Up Date [id number]   - check blood sugar at prescribed times - check blood sugar if I feel it is too high or too low - take the blood sugar log to all doctor visits - take the blood sugar meter to all doctor visits    Why is this important?   Checking your blood sugar at home helps to keep it from getting very high or very low.  Writing the results in a diary or log helps the doctor know how to care for you.  Your blood sugar log should have the time, date and the results.  Also, write down the amount of insulin or other medicine that you take.  Other information, like what you ate, exercise done and how you were feeling, will also be helpful.     Notes:  Managing her blood sugars and knowing what and how it affects her is what she is learning    . (THN)Perform Foot Care   On track    Timeframe:  Long-Range Goal Priority:  Medium Start Date:   [id number]                          Expected End Date: [id number]                  Follow Up Date [id number]   - check feet daily for cuts, sores or redness - trim toenails straight across - wash and dry feet carefully every day - wear comfortable, cotton socks - wear comfortable, well-fitting shoes    Why is this important?   Good foot care is very important when you have diabetes.  There are many  things you can do to keep your feet healthy and catch a problem early.    Notes: Foot care is on going process for diabetics     . (THN)Set My Target A1C   On track    Timeframe:  Short-Term Goal Priority:  High Start Date:  [id number]                           Expected End Date:  [id number] Follow Up Date [id number] - set target A1C    Why is this important?   Your target A1C is decided together by  you and your doctor.  It is based on several things like your age and other health issues.    Notes:  A1C target set at 6.7. Patient has reached now trying to maintain Patient has exceeded her goal by A1C is now 6.6. Monitoring patient for maintaining [id number] Patient A1C has increased from 06.6 to 7.2

## 2021-03-27 ENCOUNTER — Encounter: Payer: Self-pay | Admitting: Adult Health

## 2021-03-30 ENCOUNTER — Ambulatory Visit (INDEPENDENT_AMBULATORY_CARE_PROVIDER_SITE_OTHER): Payer: Medicare Other | Admitting: Otolaryngology

## 2021-03-30 ENCOUNTER — Ambulatory Visit (INDEPENDENT_AMBULATORY_CARE_PROVIDER_SITE_OTHER): Payer: Medicare Other | Admitting: *Deleted

## 2021-03-30 ENCOUNTER — Other Ambulatory Visit: Payer: Self-pay

## 2021-03-30 VITALS — Temp 97.5°F

## 2021-03-30 DIAGNOSIS — H6123 Impacted cerumen, bilateral: Secondary | ICD-10-CM

## 2021-03-30 DIAGNOSIS — J310 Chronic rhinitis: Secondary | ICD-10-CM

## 2021-03-30 DIAGNOSIS — J3489 Other specified disorders of nose and nasal sinuses: Secondary | ICD-10-CM

## 2021-03-30 DIAGNOSIS — J309 Allergic rhinitis, unspecified: Secondary | ICD-10-CM

## 2021-03-30 NOTE — Progress Notes (Signed)
HPI: Kimberly Lam is a 75 y.o. female who presents for evaluation of wax buildup in her ears.  She also complains of some itching in the ears.  She has had her ears cleaned previously but not here.  She also has intermittent nasal obstruction and chronic rhinitis with postnasal drainage.  She has history of allergies.  She has used Flonase in the past as well as azelastine and ipratropium nasal sprays.  She is also used saline rinses. She had previous nasal surgery performed about 20 years ago and has a septal perforation.  This whistles sometimes but she has had no bleeding.  Past Medical History:  Diagnosis Date  . Acid reflux   . Allergic rhinitis   . Allergy   . Anemia   . Arthritis    per MD- pt states no s/s of this   . Asthma   . Bilateral sciatica   . Cataract    removed both eyes   . Chronic bilateral low back pain   . Colon polyps   . Diverticulosis   . DM type 2 (diabetes mellitus, type 2) ([place])   . Environmental allergies    flowers, pollen, trees  . GERD (gastroesophageal reflux disease)   . Hyperlipidemia   . Low back pain   . Migraine    past hx- control as allergies are controlled   . Osteoporosis   . PONV (postoperative nausea and vomiting)    Past Surgical History:  Procedure Laterality Date  . CATARACT EXTRACTION, BILATERAL Bilateral 2012  . CESAREAN SECTION     x 2  . COLONOSCOPY     > 10 yrs ago- unsure if removed polyps as was told sev.different things after this colon   . DENTAL SURGERY     molars removed   . DILATION AND CURETTAGE OF UTERUS    . NASAL SINUS SURGERY    . TUBAL LIGATION     Social History   Socioeconomic History  . Marital status: Married    Spouse name: Not on file  . Number of children: 2  . Years of education: 25  . Highest education level: Not on file  Occupational History  . Occupation: Retired  Tobacco Use  . Smoking status: Never Smoker  . Smokeless tobacco: Never Used  Vaping Use  . Vaping Use: Never used   Substance and Sexual Activity  . Alcohol use: No    Alcohol/week: 0.0 standard drinks  . Drug use: No  . Sexual activity: Not on file  Other Topics Concern  . Not on file  Social History Narrative      Married 1 son one daughter, relocated to [place] after living in [place] for many years. She is a Writer of [employer]   No caffeine.          Lives at home with her husband and brother.   Right-handed.   No caffeine use.   Social Determinants of Health   Financial Resource Strain: Low Risk   . Difficulty of Paying Living Expenses: Not hard at all  Food Insecurity: Not on file  Transportation Needs: Unknown  . Lack of Transportation (Medical): No  . Lack of Transportation (Non-Medical): Not on file  Physical Activity: Insufficiently Active  . Days of Exercise per Week: 3 days  . Minutes of Exercise per Session: 30 min  Stress: No Stress Concern Present  . Feeling of Stress : Not at all  Social Connections: Moderately Integrated  .  Frequency of Communication with Friends and Family: More than three times a week  . Frequency of Social Gatherings with Friends and Family: Once a week  . Attends Religious Services: More than 4 times per year  . Active Member of Clubs or Organizations: No  . Attends Archivist Meetings: Never  . Marital Status: Married   Family History  Problem Relation Age of Onset  . Asthma Father   . Emphysema Father   . Alcohol abuse Father   . Diabetes Mellitus II Brother        x 2  . Hyperlipidemia Brother   . Hypertension Brother   . Pancreatic cancer Brother   . Prostate cancer Brother   . Diabetes Brother   . Heart disease Maternal Grandmother        hardening of the arteries  . Heart murmur Daughter   . Glaucoma Brother   . Breast cancer Neg Hx   . Colon cancer Neg Hx   . Colon polyps Neg Hx   . Esophageal cancer Neg Hx   . Rectal cancer Neg Hx   . Stomach cancer Neg Hx    Allergies  Allergen  Reactions  . Peanut-Containing Drug Products Anaphylaxis  . Eggs Or Egg-Derived Products Nausea Only    Nausea only- can eat cakes with eggs   . Milk-Related Compounds Nausea And Vomiting    Milk and cheese  . Actos [Pioglitazone] Nausea Only  . Soy Allergy Nausea Only    Pt states had allergy testing and was told allergic to soy-causes nausea but no other reactions  . Vicodin [Hydrocodone-Acetaminophen] Nausea Only   Prior to Admission medications   Medication Sig Start Date End Date Taking? Authorizing Provider  Alcohol Swabs (B-D SINGLE USE SWABS REGULAR) PADS USE TO TEST BLOOD GLUCOSE FOUR TIMES DAILY 02/27/19   Nafziger, Tommi Rumps, NP  alendronate (FOSAMAX) 70 MG tablet TAKE 1 TABLET BY MOUTH  WEEKLY WITH 8 OZ OF PLAIN  WATER 30 MINUTES BEFORE  FIRST FOOD, DRINK OR MEDS.  STAY UPRIGHT FOR 30 MINS 03/15/21   Nafziger, Tommi Rumps, NP  Ascorbic Acid (VITAMIN C) 1000 MG tablet Take 1,000 mg by mouth as needed.    [provider]  azelastine (ASTELIN) 0.1 % nasal spray 1-2 sprays per nostril twice daily for runny nose 02/26/21   Ambs, Kathrine Cords, FNP  Azelastine HCl 0.15 % SOLN Use 1-2 sprays per nostril twice a day as needed for sinus drainage. 12/05/19   Garnet Sierras, DO  Barberry-Oreg Grape-Goldenseal (BERBERINE COMPLEX PO) Take by mouth daily.    [provider]  Blood Glucose Monitoring Suppl ([place]) w/Device KIT USE AS DIRECTED 03/15/21   Nafziger, Tommi Rumps, NP  Calcium Carbonate-Vitamin D (CALCIUM 600+D PO) Take by mouth. Patient not taking: Reported on 03/08/2021    [provider]  CINNAMON PO Take 1 tablet by mouth as needed.    [provider]  Continuous Blood Gluc Sensor (FREESTYLE LIBRE 2 SENSOR) MISC Use with Elenor Legato app 11/11/20   [name], NP  Cranberry 450 MG CAPS Take 1 capsule by mouth as needed.    [provider]  Cyanocobalamin (VITAMIN B-12 CR) 1500 MCG TBCR Take 1 tablet by mouth as needed.    [provider]   Echinacea-Goldenseal (ECHINACEA COMB/GOLDEN SEAL PO) Take 1 tablet by mouth as needed. Reported on 12/16/2015    [provider]  EPINEPHrine 0.3 mg/0.3 mL IJ SOAJ injection Inject 0.3 mg into the muscle  as needed for anaphylaxis.    [provider]  fluticasone (FLONASE) 50 MCG/ACT nasal spray USE 1 SPRAY IN BOTH  NOSTRILS TWICE DAILY 09/18/20   Garnet Sierras, DO  gabapentin (NEURONTIN) 300 MG capsule TAKE 2 CAPSULES BY MOUTH 3  TIMES DAILY 02/02/21   Suzzanne Cloud, NP  Eloise Levels, Camillia sinensis, (GREEN TEA PO) Take by mouth as needed. Reported on 12/16/2015    [provider]  ipratropium (ATROVENT) 0.06 % nasal spray USE 2 SPRAYS IN BOTH  NOSTRILS 3 TIMES DAILY AS  NEEDED FOR RHINITIS 09/18/20   Garnet Sierras, DO  Lactobacillus (ACIDOPHILUS PROBIOTIC PO) Take 1 tablet by mouth daily.    [provider]  Lancet Devices [facility] PLUS LANCING) MISC USE AS DIRECTED 09/18/20   Nafziger, Tommi Rumps, NP  Lancets (ONETOUCH DELICA PLUS [id number]) MISC TEST IN THE MORNING , AT  [place], IN THE EVENING AND AT BEDTIME 12/30/20   Nafziger, Tommi Rumps, NP  levocetirizine (XYZAL) 5 MG tablet TAKE 1 TABLET BY MOUTH IN  THE EVENING 03/15/21   Garnet Sierras, DO  MELATONIN ER PO Take by mouth.    [provider]  meloxicam (MOBIC) 7.5 MG tablet Take 1 tablet (7.5 mg total) by mouth daily. Patient not taking: Reported on 03/08/2021 04/07/20   Suzzanne Cloud, NP  metFORMIN (GLUCOPHAGE-XR) 500 MG 24 hr tablet TAKE 2 TABLETS BY MOUTH  TWICE DAILY 03/15/21   Nafziger, Tommi Rumps, NP  montelukast (SINGULAIR) 10 MG tablet TAKE 1 TABLET BY MOUTH AT  BEDTIME 09/21/20   Garnet Sierras, DO  Omega-3 Fatty Acids (OMEGA 3 PO) Take 1 tablet by mouth as needed.    [provider]  omeprazole (PRILOSEC) 40 MG capsule Take 40 mg by mouth daily. Patient not taking: Reported on 03/08/2021    [provider]  [facility] VERIO test strip USE TO TEST BLOOD GLUCOSE 4 TIMES DAILY 12/30/20   Nafziger, Tommi Rumps,  NP  Polyethyl Glycol-Propyl Glycol (SYSTANE FREE OP) Apply 1 drop to eye daily. Uses thera work for dr eyes per pt    [provider]  POTASSIUM PO Take by mouth.    [provider]  ramipril (ALTACE) 2.5 MG capsule TAKE 1 CAPSULE BY MOUTH  DAILY 09/21/20   Nafziger, Tommi Rumps, NP  simvastatin (ZOCOR) 20 MG tablet TAKE 1 TABLET BY MOUTH  DAILY 03/15/21   [name], NP  Spacer/Aero-Holding Chambers (AEROCHAMBER PLUS) inhaler Use as instructed with MDI Patient not taking: Reported on 03/08/2021 12/16/15   Bobbitt, Sedalia Muta, MD  Stinging Nettle 2 % POWD Take 1 tablet by mouth as needed. Reported on 12/16/2015    [provider]  traZODone (DESYREL) 50 MG tablet TAKE 1/2 TO 1 TABLET BY  MOUTH AT BEDTIME AS NEEDED  FOR SLEEP 03/15/21   Nafziger, Tommi Rumps, NP  TURMERIC PO Take 800 mg by mouth as needed. Reported on 12/16/2015    [provider]  VENTOLIN HFA 108 (90 Base) MCG/ACT inhaler Inhale 2 puffs into the lungs every 6 (six) hours as needed for wheezing or shortness of breath. Patient not taking: Reported on 03/08/2021    [provider]     Positive ROS: Otherwise negative  All other systems have been reviewed and were otherwise negative with the exception of those mentioned in the HPI and as above.  Physical Exam: Constitutional: Alert, well-appearing, no acute distress Ears: External ears without lesions or tenderness.  She has moderate amount of wax buildup in  both ears with some dry skin that was cleaned with curettes.  TMs were clear bilaterally. Nasal: External nose without lesions. Septum relatively midline with a 1 cm anterior septal perforation with no crusting or bleeding.  Moderate rhinitis.  No polyps noted with clear nasal passages otherwise..  Oral: Lips and gums without lesions. Tongue and palate mucosa without lesions. Posterior oropharynx clear. Neck: No palpable adenopathy or masses Respiratory: Breathing comfortably  Skin: No  facial/neck lesions or rash noted.  Cerumen impaction removal  Date/Time: 03/30/2021 1:26 PM Performed by: Rozetta Nunnery, MD Authorized by: Rozetta Nunnery, MD   Consent:    Consent obtained:  Verbal   Consent given by:  Patient   Risks discussed:  Pain and bleeding Procedure details:    Location:  L ear and R ear   Procedure type: curette   Post-procedure details:    Inspection:  TM intact and canal normal   Hearing quality:  Improved   Patient tolerance of procedure:  Tolerated well, no immediate complications Comments:     Minimal wax buildup in both ears.  TMs were clear bilaterally.    Assessment: Wax buildup with itching in the ears. Chronic rhinitis with small septal perforation from previous surgery.  Plan: Concerning nasal symptoms recommended regular use of Flonase 2 sprays each nostril at night and use of saline nasal irrigation. Discussed with her concerning using baby oil or other similar oral or lotion to help with the dryness in the ears and itching in the ears. She will follow-up as needed.  Radene Journey, MD

## 2021-04-05 ENCOUNTER — Other Ambulatory Visit: Payer: Self-pay | Admitting: Adult Health

## 2021-04-05 DIAGNOSIS — Z1231 Encounter for screening mammogram for malignant neoplasm of breast: Secondary | ICD-10-CM

## 2021-04-06 ENCOUNTER — Ambulatory Visit (INDEPENDENT_AMBULATORY_CARE_PROVIDER_SITE_OTHER): Payer: Medicare Other | Admitting: *Deleted

## 2021-04-06 DIAGNOSIS — J309 Allergic rhinitis, unspecified: Secondary | ICD-10-CM

## 2021-04-14 ENCOUNTER — Ambulatory Visit (INDEPENDENT_AMBULATORY_CARE_PROVIDER_SITE_OTHER): Payer: Medicare Other

## 2021-04-14 DIAGNOSIS — J309 Allergic rhinitis, unspecified: Secondary | ICD-10-CM

## 2021-04-15 ENCOUNTER — Encounter: Payer: Self-pay | Admitting: Neurology

## 2021-04-15 ENCOUNTER — Ambulatory Visit: Payer: Medicare Other | Admitting: Neurology

## 2021-04-15 DIAGNOSIS — M545 Low back pain, unspecified: Secondary | ICD-10-CM | POA: Diagnosis not present

## 2021-04-15 DIAGNOSIS — G8929 Other chronic pain: Secondary | ICD-10-CM | POA: Insufficient documentation

## 2021-04-15 MED ORDER — GABAPENTIN 300 MG PO CAPS: ORAL_CAPSULE | ORAL | 3 refills | Status: DC

## 2021-04-15 NOTE — Patient Instructions (Signed)
Continue the gabapentin  Continue your exercise See you back in 1 year

## 2021-04-15 NOTE — Progress Notes (Signed)
PATIENT: Kimberly Lam DOB: 1946/09/26  REASON FOR VISIT: follow up HISTORY FROM: patient  HISTORY OF PRESENT ILLNESS: Today 04/15/21  HISTORY Kimberly Lam a 75 years old female, seen in refer by her primary care nurse practitioner Shirline Frees for evaluation of leg cramping, tremors, initial evaluation was on January 23, 2018.   She has past medical history of hypertension, diabetes, hyperlipidemia  I saw her initially in 2016 for evaluation of low back pain, radiating pain to bilateral hip, bilateral leg muscle cramping, and weakness.  She has history of low back pain since 2006, midline low back, occasionally go to posterior thigh, go down below her knee, worsening with prolonged standing, or walking,  Previous her low back pain has responded very well to epidural injection, but the benefit gradually fades away, over the years, she complains of worsening low back pain, radiating pain to bilateral lower extremity again, in addition, she complains of bilateral feet paresthesia,   She had a history of diabetes, she denies gait difficulty, no bowel and bladder incontinence, she denies bilateral upper extremity paresthesia or weakness.   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   Note from Dr. Harmon Pier, [facility], [place] in 2017, she was diagnosed with degenerative spondylolisthesis L4-L5, L5-S1, neurogenic claudication, low back pain, received epidural injection in Feb 2015, benefit lasted about one year  MRI of lumbar spine December 2016, There is a transitional L6/S1 vertebral body. 8 mm of anterolisthesis of L5 upon L6/S1 of a degenerative nature due to severe facet hypertrophy. At this level, there is mild spinal stenosis and moderately severe right foraminal narrowing. There could be  compression of the right L5 nerve root. Degenerative changes at L6/S1-S2 lead to severe right foraminal narrowing that could lead to compression of the exiting right S1 nerve root  She continue complains of chronic low back pain, today she also complains of new onset right hand shaking, that was intermittent, getting worse when she holding utensils, writing, mild involvement on the left side, she continue complains of frequent lower extremity cramping especially on the right side, difficulty to walk in the morning. There was no family history of tremor  UPDATE Nov 5th 2019: She was given prescription of gabapentin for her bilateral feet paresthesia, 300 mg 4 times a day does help her lower extremity cramps, but complains of daytime sleepiness, but she often woke up in the middle of the night complains of left muscle cramping, difficulty going to sleep,  We again personally reviewed MRI of lumbar spine in December 2016, transitional L6-S1 vertebral body, 8 mm of anterolisthesis of L5 upon S1 with severe facet hypertrophy, mild spinal stenosis, moderate severe right foraminal narrowing, potential compression of right L5 nerve roots,  UPDATE July 10 2019: She is overall doing much better, taking gabapentin 300 mg 2 tablets before bedtime, open skip daytime dose, epidural injection few months ago has helped her as well, she exercise regularly, denies gait abnormality, denies bowel and bladder incontinence  Update November 21, 2019 SS: She overall continues to do very well.  Apparently, she rotates her gabapentin dosing, says she takes gabapentin 300 mg, 2 capsules at least twice a day, at most she may take 4 times a day.  She adjust the regimen depending on her leg cramps.  She also has some right hip pain.  She knows  that her low back gets to bothering her, she will do stretching, and she is able to control it well.  She is going to relocate to [place], but may still come here if she can't find  another neurologist.    Update April 06, 2020 SS: Here today to discuss new problem of right hip pain.  Indicates since Friday, right hip/pelvic pain, with weightbearing and turning.  She denies fall or injury.  She denies pain down the legs, no numbness or weakness.  She denies any urinary symptoms.  She remains on gabapentin 600 mg twice a day for cramping in her legs.  Indicates this pain is not typical of her chronic low back pain, which is usually in the center of her back.  She presents today unaccompanied. She has not tried tylenol or ibuprofen.  Update April 15, 2021 SS: Here today alone, decided not to move to [place], will be going to [place] to take care of her son. Chronic low back pain is "fantastic", certain times it may bother her, does her exercises with good benefit. Right hip is only bothersome when lying on it. Currently taking 600 mg twice daily, will go to 3 times daily if pain worsens, will drop dose after few days. Is off Meloxicam. No falls. Tries to walk 1-2 miles 3-4 days a week. Pain doesn't radiate down both legs.   Had x-ray of the right hip and pelvis in April 2021, was negative, no acute fracture or dislocation, no significant arthritic changes.  Felt the right hip pain could be coming from the back.  Has previously had ESI, last was Jan 2020, would do again if became bothersome.   REVIEW OF SYSTEMS: Out of a complete 14 system review of symptoms, the patient complains only of the following symptoms, and all other reviewed systems are negative.  Hip pain  ALLERGIES: Allergies  Allergen Reactions  . Peanut-Containing Drug Products Anaphylaxis  . Eggs Or Egg-Derived Products Nausea Only    Nausea only- can eat cakes with eggs   . Milk-Related Compounds Nausea And Vomiting    Milk and cheese  . Actos [Pioglitazone] Nausea Only  . Soy Allergy Nausea Only    Pt states had allergy testing and was told allergic to soy-causes nausea but no other reactions  . Vicodin  [Hydrocodone-Acetaminophen] Nausea Only    HOME MEDICATIONS: Outpatient Medications Prior to Visit  Medication Sig Dispense Refill  . Alcohol Swabs (B-D SINGLE USE SWABS REGULAR) PADS USE TO TEST BLOOD GLUCOSE FOUR TIMES DAILY 200 each 3  . alendronate (FOSAMAX) 70 MG tablet TAKE 1 TABLET BY MOUTH  WEEKLY WITH 8 OZ OF PLAIN  WATER 30 MINUTES BEFORE  FIRST FOOD, DRINK OR MEDS.  STAY UPRIGHT FOR 30 MINS 12 tablet 3  . Ascorbic Acid (VITAMIN C) 1000 MG tablet Take 1,000 mg by mouth as needed.    Marland Kitchen azelastine (ASTELIN) 0.1 % nasal spray 1-2 sprays per nostril twice daily for runny nose 90 mL 1  . Azelastine HCl 0.15 % SOLN Use 1-2 sprays per nostril twice a day as needed for sinus drainage. 30 mL 5  . Barberry-Oreg Grape-Goldenseal (BERBERINE COMPLEX PO) Take by mouth daily.    . Blood Glucose Monitoring Suppl (ONETOUCH VERIO FLEX SYSTEM) w/Device KIT USE AS DIRECTED 1 kit 0  . Calcium Carbonate-Vitamin D (CALCIUM 600+D PO) Take by mouth.    Marland Kitchen CINNAMON PO Take 1 tablet by mouth as needed.    . Cranberry 450 MG CAPS Take  1 capsule by mouth as needed.    . Cyanocobalamin (VITAMIN B-12 CR) 1500 MCG TBCR Take 1 tablet by mouth as needed.    . Echinacea-Goldenseal (ECHINACEA COMB/GOLDEN SEAL PO) Take 1 tablet by mouth as needed. Reported on 12/16/2015    . EPINEPHrine 0.3 mg/0.3 mL IJ SOAJ injection Inject 0.3 mg into the muscle as needed for anaphylaxis.    . fluticasone (FLONASE) 50 MCG/ACT nasal spray USE 1 SPRAY IN BOTH  NOSTRILS TWICE DAILY 48 g 1  . Green Tea, Camillia sinensis, (GREEN TEA PO) Take by mouth as needed. Reported on 12/16/2015    . ipratropium (ATROVENT) 0.06 % nasal spray USE 2 SPRAYS IN BOTH  NOSTRILS 3 TIMES DAILY AS  NEEDED FOR RHINITIS 90 mL 3  . Lactobacillus (ACIDOPHILUS PROBIOTIC PO) Take 1 tablet by mouth daily.    Elmore Guise Devices [facility] DELICA PLUS LANCING) MISC USE AS DIRECTED 1 each 1  . Lancets (ONETOUCH DELICA PLUS QPRFFM38G) MISC TEST IN THE MORNING , AT  NOON,  IN THE EVENING AND AT BEDTIME 400 each 3  . levocetirizine (XYZAL) 5 MG tablet TAKE 1 TABLET BY MOUTH IN  THE EVENING 90 tablet 0  . MELATONIN ER PO Take by mouth.    . metFORMIN (GLUCOPHAGE-XR) 500 MG 24 hr tablet TAKE 2 TABLETS BY MOUTH  TWICE DAILY 360 tablet 3  . montelukast (SINGULAIR) 10 MG tablet TAKE 1 TABLET BY MOUTH AT  BEDTIME 90 tablet 3  . Omega-3 Fatty Acids (OMEGA 3 PO) Take 1 tablet by mouth as needed.    Marland Kitchen omeprazole (PRILOSEC) 40 MG capsule Take 40 mg by mouth daily.    Glory Rosebush VERIO test strip USE TO TEST BLOOD GLUCOSE 4 TIMES DAILY 400 strip 3  . Polyethyl Glycol-Propyl Glycol (SYSTANE FREE OP) Apply 1 drop to eye daily. Uses thera work for dr eyes per pt    . POTASSIUM PO Take by mouth.    . ramipril (ALTACE) 2.5 MG capsule TAKE 1 CAPSULE BY MOUTH  DAILY 90 capsule 3  . simvastatin (ZOCOR) 20 MG tablet TAKE 1 TABLET BY MOUTH  DAILY 90 tablet 3  . Spacer/Aero-Holding Chambers (AEROCHAMBER PLUS) inhaler Use as instructed with MDI 1 each 2  . Stinging Nettle 2 % POWD Take 1 tablet by mouth as needed. Reported on 12/16/2015    . traZODone (DESYREL) 50 MG tablet TAKE 1/2 TO 1 TABLET BY  MOUTH AT BEDTIME AS NEEDED  FOR SLEEP 90 tablet 3  . TURMERIC PO Take 800 mg by mouth as needed. Reported on 12/16/2015    . VENTOLIN HFA 108 (90 Base) MCG/ACT inhaler Inhale 2 puffs into the lungs every 6 (six) hours as needed for wheezing or shortness of breath.    . gabapentin (NEURONTIN) 300 MG capsule TAKE 2 CAPSULES BY MOUTH 3  TIMES DAILY 540 capsule 0  . Continuous Blood Gluc Sensor (FREESTYLE LIBRE 2 SENSOR) MISC Use with Libre app 6 each 3  . meloxicam (MOBIC) 7.5 MG tablet Take 1 tablet (7.5 mg total) by mouth daily. (Patient not taking: Reported on 03/08/2021) 30 tablet 1   No facility-administered medications prior to visit.    PAST MEDICAL HISTORY: Past Medical History:  Diagnosis Date  . Acid reflux   . Allergic rhinitis   . Allergy   . Anemia   . Arthritis    per MD-  pt states no s/s of this   . Asthma   . Bilateral sciatica   .  Cataract    removed both eyes   . Chronic bilateral low back pain   . Colon polyps   . Diverticulosis   . DM type 2 (diabetes mellitus, type 2) ([place])   . Environmental allergies    flowers, pollen, trees  . GERD (gastroesophageal reflux disease)   . Hyperlipidemia   . Low back pain   . Migraine    past hx- control as allergies are controlled   . Osteoporosis   . PONV (postoperative nausea and vomiting)     PAST SURGICAL HISTORY: Past Surgical History:  Procedure Laterality Date  . CATARACT EXTRACTION, BILATERAL Bilateral 2012  . CESAREAN SECTION     x 2  . COLONOSCOPY     > 10 yrs ago- unsure if removed polyps as was told sev.different things after this colon   . DENTAL SURGERY     molars removed   . DILATION AND CURETTAGE OF UTERUS    . NASAL SINUS SURGERY    . TUBAL LIGATION      FAMILY HISTORY: Family History  Problem Relation Age of Onset  . Asthma Father   . Emphysema Father   . Alcohol abuse Father   . Diabetes Mellitus II Brother        x 2  . Hyperlipidemia Brother   . Hypertension Brother   . Pancreatic cancer Brother   . Prostate cancer Brother   . Diabetes Brother   . Heart disease Maternal Grandmother        hardening of the arteries  . Heart murmur Daughter   . Glaucoma Brother   . Breast cancer Neg Hx   . Colon cancer Neg Hx   . Colon polyps Neg Hx   . Esophageal cancer Neg Hx   . Rectal cancer Neg Hx   . Stomach cancer Neg Hx     SOCIAL HISTORY: Social History   Socioeconomic History  . Marital status: Married    Spouse name: Not on file  . Number of children: 2  . Years of education: 20  . Highest education level: Not on file  Occupational History  . Occupation: Retired  Tobacco Use  . Smoking status: Never Smoker  . Smokeless tobacco: Never Used  Vaping Use  . Vaping Use: Never used  Substance and Sexual Activity  . Alcohol use: No    Alcohol/week: 0.0 standard  drinks  . Drug use: No  . Sexual activity: Not on file  Other Topics Concern  . Not on file  Social History Narrative      Married 1 son one daughter, relocated to [place] after living in [place] for many years. She is a Writer of [employer]   No caffeine.          Lives at home with her husband and brother.   Right-handed.   No caffeine use.   Social Determinants of Health   Financial Resource Strain: Low Risk   . Difficulty of Paying Living Expenses: Not hard at all  Food Insecurity: Not on file  Transportation Needs: Unknown  . Lack of Transportation (Medical): No  . Lack of Transportation (Non-Medical): Not on file  Physical Activity: Insufficiently Active  . Days of Exercise per Week: 3 days  . Minutes of Exercise per Session: 30 min  Stress: No Stress Concern Present  . Feeling of Stress : Not at all  Social Connections: Moderately Integrated  . Frequency of Communication with Friends and Family: More than  three times a week  . Frequency of Social Gatherings with Friends and Family: Once a week  . Attends Religious Services: More than 4 times per year  . Active Member of Clubs or Organizations: No  . Attends Archivist Meetings: Never  . Marital Status: Married  Human resources officer Violence: Not At Risk  . Fear of Current or Ex-Partner: No  . Emotionally Abused: No  . Physically Abused: No  . Sexually Abused: No    PHYSICAL EXAM  Vitals:   04/15/21 1317  BP: 120/68  Pulse: 71  Weight: 134 lb (60.8 kg)  Height: $Remove'5\' 4"'WBYSYgx$  (1.626 m)   Body mass index is 23 kg/m.  Generalized: Well developed, in no acute distress   Neurological examination  Mentation: Alert oriented to time, place, history taking. Follows all commands speech and language fluent Cranial nerve II-XII: Pupils were equal round reactive to light. Extraocular movements were full, visual field were full on confrontational test. Facial sensation and strength were  normal. Head turning and shoulder shrug  were normal and symmetric. Motor: The motor testing reveals 5 over 5 strength of all 4 extremities. Good symmetric motor tone is noted throughout. No weakness with right hip flexion was noted Sensory: Sensory testing is intact to soft touch on all 4 extremities. No evidence of extinction is noted.  Coordination: Cerebellar testing reveals good finger-nose-finger and heel-to-shin bilaterally.  Gait and station: Gait is normal, steady. Reflexes: Deep tendon reflexes are symmetric and normal bilaterally.   DIAGNOSTIC DATA (LABS, IMAGING, TESTING) - I reviewed patient records, labs, notes, testing and imaging myself where available.  Lab Results  Component Value Date   WBC 3.4 (L) 02/11/2021   HGB 13.6 02/11/2021   HCT 41.2 02/11/2021   MCV 91.8 02/11/2021   PLT 231.0 02/11/2021      Component Value Date/Time   NA 140 02/11/2021 1343   NA 140 04/12/2017 0000   K 4.8 02/11/2021 1343   CL 102 02/11/2021 1343   CO2 30 02/11/2021 1343   GLUCOSE 118 (H) 02/11/2021 1343   BUN 15 02/11/2021 1343   BUN 11 04/12/2017 0000   CREATININE 0.86 02/11/2021 1343   CALCIUM 10.2 02/11/2021 1343   PROT 7.4 02/11/2021 1343   ALBUMIN 4.3 02/11/2021 1343   AST 14 02/11/2021 1343   ALT 11 02/11/2021 1343   ALKPHOS 48 02/11/2021 1343   BILITOT 0.4 02/11/2021 1343   Lab Results  Component Value Date   CHOL 175 02/11/2021   HDL 69.00 02/11/2021   LDLCALC 85 02/11/2021   TRIG 104.0 02/11/2021   CHOLHDL 3 02/11/2021   Lab Results  Component Value Date   HGBA1C 7.2 (H) 02/11/2021   No results found for: VITAMINB12 Lab Results  Component Value Date   TSH 1.13 02/11/2021    ASSESSMENT AND PLAN 75 y.o. year old female  has a past medical history of Acid reflux, Allergic rhinitis, Allergy, Anemia, Arthritis, Asthma, Bilateral sciatica, Cataract, Chronic bilateral low back pain, Colon polyps, Diverticulosis, DM type 2 (diabetes mellitus, type 2) ([place]),  Environmental allergies, GERD (gastroesophageal reflux disease), Hyperlipidemia, Low back pain, Migraine, Osteoporosis, and PONV (postoperative nausea and vomiting). here with:  1.  Chronic low back pain, radiating pain to bilateral lower extremities 2.  Acute right hip/pelvic pain  -Currently pain is well controlled -Continue gabapentin 600 mg up to 3 times daily -Offered referral to orthopedics for the right hip, but may ultimately be coming from the low back, right now feels doing  well, hold off on any further intervention, in future consider another ESI, repeat MRI lumbar spine -Encouraged to continue her exercise -Follow-up in 1 year or sooner if needed  Evangeline Dakin, DNP 04/15/2021, 1:38 PM [facility] [address] [place], [place] [address] [phone]

## 2021-04-20 ENCOUNTER — Other Ambulatory Visit: Payer: Self-pay | Admitting: Allergy

## 2021-04-20 ENCOUNTER — Ambulatory Visit (INDEPENDENT_AMBULATORY_CARE_PROVIDER_SITE_OTHER): Payer: Medicare Other | Admitting: *Deleted

## 2021-04-20 DIAGNOSIS — J309 Allergic rhinitis, unspecified: Secondary | ICD-10-CM | POA: Diagnosis not present

## 2021-05-04 ENCOUNTER — Ambulatory Visit (INDEPENDENT_AMBULATORY_CARE_PROVIDER_SITE_OTHER): Payer: Medicare Other | Admitting: *Deleted

## 2021-05-04 DIAGNOSIS — J309 Allergic rhinitis, unspecified: Secondary | ICD-10-CM

## 2021-05-07 ENCOUNTER — Other Ambulatory Visit: Payer: Self-pay | Admitting: Allergy

## 2021-05-10 ENCOUNTER — Other Ambulatory Visit: Payer: Self-pay

## 2021-05-10 MED ORDER — IPRATROPIUM BROMIDE 0.06 % NA SOLN: NASAL | 4 refills | Status: DC

## 2021-05-10 NOTE — Telephone Encounter (Signed)
Patient was last seen 03/08/21 and has an appointment for September of 2022

## 2021-05-10 NOTE — Telephone Encounter (Signed)
Last refilled 04/21/21

## 2021-05-10 NOTE — Telephone Encounter (Signed)
Called patient she was able to get her refill on 04/21/21 and she has been using the Atrovent nasal spray. She also stated she would like a 3 month refill for the Atrovent nasal spray sent into her mail order pharmacy.

## 2021-05-10 NOTE — Telephone Encounter (Signed)
Is she taking this nasal spray?  Can you call patient and see if she wants the 3 months supply sent to her mail order pharmacy?

## 2021-05-11 ENCOUNTER — Other Ambulatory Visit: Payer: Self-pay

## 2021-05-12 ENCOUNTER — Ambulatory Visit (INDEPENDENT_AMBULATORY_CARE_PROVIDER_SITE_OTHER): Payer: Medicare Other | Admitting: Adult Health

## 2021-05-12 ENCOUNTER — Encounter: Payer: Self-pay | Admitting: Adult Health

## 2021-05-12 VITALS — BP 126/68 | HR 85 | Temp 98.5°F | Ht 64.0 in | Wt 132.2 lb

## 2021-05-12 DIAGNOSIS — E119 Type 2 diabetes mellitus without complications: Secondary | ICD-10-CM | POA: Diagnosis not present

## 2021-05-12 LAB — POCT GLYCOSYLATED HEMOGLOBIN (HGB A1C): Hemoglobin A1C: 7 % — AB (ref 4.0–5.6)

## 2021-05-12 NOTE — Progress Notes (Signed)
Subjective:    Patient ID: Kimberly Lam, female    DOB: 1946-01-04, 75 y.o.   MRN: [id number]  HPI 75 year old female who  has a past medical history of Acid reflux, Allergic rhinitis, Allergy, Anemia, Arthritis, Asthma, Bilateral sciatica, Cataract, Chronic bilateral low back pain, Colon polyps, Diverticulosis, DM type 2 (diabetes mellitus, type 2) ([place]), Environmental allergies, GERD (gastroesophageal reflux disease), Hyperlipidemia, Low back pain, Migraine, Osteoporosis, and PONV (postoperative nausea and vomiting).  She presents to the office today for follow up regarding DM.  Currently prescribed metformin 1000 mg extended release twice daily.  She has not had any episodes of hypoglycemia.  She does take ramipril 2.5 mg for kidney protection. In the past he has had excellent control of her diabetes.  When she was last seen in February 2022 her A1c had increased slightly from 6.6-7.2  Lab Results  Component Value Date   HGBA1C 7.2 (H) 02/11/2021    Review of Systems See HPI   Past Medical History:  Diagnosis Date  . Acid reflux   . Allergic rhinitis   . Allergy   . Anemia   . Arthritis    per MD- pt states no s/s of this   . Asthma   . Bilateral sciatica   . Cataract    removed both eyes   . Chronic bilateral low back pain   . Colon polyps   . Diverticulosis   . DM type 2 (diabetes mellitus, type 2) ([place])   . Environmental allergies    flowers, pollen, trees  . GERD (gastroesophageal reflux disease)   . Hyperlipidemia   . Low back pain   . Migraine    past hx- control as allergies are controlled   . Osteoporosis   . PONV (postoperative nausea and vomiting)     Social History   Socioeconomic History  . Marital status: Married    Spouse name: Not on file  . Number of children: 2  . Years of education: 58  . Highest education level: Not on file  Occupational History  . Occupation: Retired  Tobacco Use  . Smoking status: Never Smoker  . Smokeless  tobacco: Never Used  Vaping Use  . Vaping Use: Never used  Substance and Sexual Activity  . Alcohol use: No    Alcohol/week: 0.0 standard drinks  . Drug use: No  . Sexual activity: Not on file  Other Topics Concern  . Not on file  Social History Narrative      Married 1 son one daughter, relocated to [place] after living in [place] for many years. 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   No caffeine.          Lives at home with her husband and brother.   Right-handed.   No caffeine use.   Social Determinants of Health   Financial Resource Strain: Low Risk   . Difficulty of Paying Living Expenses: Not hard at all  Food Insecurity: Not on file  Transportation Needs: Unknown  . Lack of Transportation (Medical): No  . Lack of Transportation (Non-Medical): Not on file  Physical Activity: Insufficiently Active  . Days of Exercise per Week: 3 days  . Minutes of Exercise per Session: 30 min  Stress: No Stress Concern Present  . Feeling of Stress : Not at all  Social Connections: Moderately Integrated  . Frequency of Communication with Friends and Family: More than three times a week  . Frequency of  Social Gatherings with Friends and Family: Once a week  . Attends Religious Services: More than 4 times per year  . Active Member of Clubs or Organizations: No  . Attends Archivist Meetings: Never  . Marital Status: Married  Human resources officer Violence: Not At Risk  . Fear of Current or Ex-Partner: No  . Emotionally Abused: No  . Physically Abused: No  . Sexually Abused: No    Past Surgical History:  Procedure Laterality Date  . CATARACT EXTRACTION, BILATERAL Bilateral 2012  . CESAREAN SECTION     x 2  . COLONOSCOPY     > 10 yrs ago- unsure if removed polyps as was told sev.different things after this colon   . DENTAL SURGERY     molars removed   . DILATION AND CURETTAGE OF UTERUS    . NASAL SINUS SURGERY    . TUBAL LIGATION      Family  History  Problem Relation Age of Onset  . Asthma Father   . Emphysema Father   . Alcohol abuse Father   . Diabetes Mellitus II Brother        x 2  . Hyperlipidemia Brother   . Hypertension Brother   . Pancreatic cancer Brother   . Prostate cancer Brother   . Diabetes Brother   . Heart disease Maternal Grandmother        hardening of the arteries  . Heart murmur Daughter   . Glaucoma Brother   . Breast cancer Neg Hx   . Colon cancer Neg Hx   . Colon polyps Neg Hx   . Esophageal cancer Neg Hx   . Rectal cancer Neg Hx   . Stomach cancer Neg Hx     Allergies  Allergen Reactions  . Peanut-Containing Drug Products Anaphylaxis  . Eggs Or Egg-Derived Products Nausea Only    Nausea only- can eat cakes with eggs   . Milk-Related Compounds Nausea And Vomiting    Milk and cheese  . Actos [Pioglitazone] Nausea Only  . Soy Allergy Nausea Only    Pt states had allergy testing and was told allergic to soy-causes nausea but no other reactions  . Vicodin [Hydrocodone-Acetaminophen] Nausea Only    Current Outpatient Medications on File Prior to Visit  Medication Sig Dispense Refill  . Alcohol Swabs (B-D SINGLE USE SWABS REGULAR) PADS USE TO TEST BLOOD GLUCOSE FOUR TIMES DAILY 200 each 3  . alendronate (FOSAMAX) 70 MG tablet TAKE 1 TABLET BY MOUTH  WEEKLY WITH 8 OZ OF PLAIN  WATER 30 MINUTES BEFORE  FIRST FOOD, DRINK OR MEDS.  STAY UPRIGHT FOR 30 MINS 12 tablet 3  . Ascorbic Acid (VITAMIN C) 1000 MG tablet Take 1,000 mg by mouth as needed.    . Azelastine HCl 0.15 % SOLN Use 1-2 sprays per nostril twice a day as needed for sinus drainage. 30 mL 5  . Barberry-Oreg Grape-Goldenseal (BERBERINE COMPLEX PO) Take by mouth daily.    . Blood Glucose Monitoring Suppl (ONETOUCH VERIO FLEX SYSTEM) w/Device KIT USE AS DIRECTED 1 kit 0  . Calcium Carbonate-Vitamin D (CALCIUM 600+D PO) Take by mouth.    Marland Kitchen CINNAMON PO Take 1 tablet by mouth as needed.    . Cranberry 450 MG CAPS Take 1 capsule by mouth as  needed.    . Cyanocobalamin (VITAMIN B-12 CR) 1500 MCG TBCR Take 1 tablet by mouth as needed.    . Echinacea-Goldenseal (ECHINACEA COMB/GOLDEN SEAL PO) Take 1 tablet by mouth as needed.  Reported on 12/16/2015    . EPINEPHrine 0.3 mg/0.3 mL IJ SOAJ injection Inject 0.3 mg into the muscle as needed for anaphylaxis.    . fluticasone (FLONASE) 50 MCG/ACT nasal spray USE 1 SPRAY IN BOTH  NOSTRILS TWICE DAILY 48 g 1  . gabapentin (NEURONTIN) 300 MG capsule TAKE 2 CAPSULES BY MOUTH 3  TIMES DAILY 540 capsule 3  . Green Tea, Camillia sinensis, (GREEN TEA PO) Take by mouth as needed. Reported on 12/16/2015    . ipratropium (ATROVENT) 0.06 % nasal spray Inhale 2 sprays in each nostril 3 times a day as needed for drainage. 15 mL 4  . Lactobacillus (ACIDOPHILUS PROBIOTIC PO) Take 1 tablet by mouth daily.    . Lancet Devices (ONETOUCH DELICA PLUS LANCING) MISC USE AS DIRECTED 1 each 1  . Lancets (ONETOUCH DELICA PLUS LANCET30G) MISC TEST IN THE MORNING , AT  NOON, IN THE EVENING AND AT BEDTIME 400 each 3  . levocetirizine (XYZAL) 5 MG tablet TAKE 1 TABLET BY MOUTH IN  THE EVENING 90 tablet 0  . MELATONIN ER PO Take by mouth.    . metFORMIN (GLUCOPHAGE-XR) 500 MG 24 hr tablet TAKE 2 TABLETS BY MOUTH  TWICE DAILY 360 tablet 3  . montelukast (SINGULAIR) 10 MG tablet TAKE 1 TABLET BY MOUTH AT  BEDTIME 90 tablet 3  . Omega-3 Fatty Acids (OMEGA 3 PO) Take 1 tablet by mouth as needed.    . omeprazole (PRILOSEC) 40 MG capsule Take 40 mg by mouth daily.    . ONETOUCH VERIO test strip USE TO TEST BLOOD GLUCOSE 4 TIMES DAILY 400 strip 3  . Polyethyl Glycol-Propyl Glycol (SYSTANE FREE OP) Apply 1 drop to eye daily. Uses thera work for dr eyes per pt    . POTASSIUM PO Take by mouth.    . ramipril (ALTACE) 2.5 MG capsule TAKE 1 CAPSULE BY MOUTH  DAILY 90 capsule 3  . simvastatin (ZOCOR) 20 MG tablet TAKE 1 TABLET BY MOUTH  DAILY 90 tablet 3  . Spacer/Aero-Holding Chambers (AEROCHAMBER PLUS) inhaler Use as instructed with  MDI 1 each 2  . Stinging Nettle 2 % POWD Take 1 tablet by mouth as needed. Reported on 12/16/2015    . traZODone (DESYREL) 50 MG tablet TAKE 1/2 TO 1 TABLET BY  MOUTH AT BEDTIME AS NEEDED  FOR SLEEP 90 tablet 3  . TURMERIC PO Take 800 mg by mouth as needed. Reported on 12/16/2015    . VENTOLIN HFA 108 (90 Base) MCG/ACT inhaler Inhale 2 puffs into the lungs every 6 (six) hours as needed for wheezing or shortness of breath.     No current facility-administered medications on file prior to visit.    BP 126/68 (BP Location: Left Arm, Patient Position: Sitting, Cuff Size: Normal)   Pulse 85   Temp 98.5 F (36.9 C) (Oral)   Ht 5' 4" (1.626 m)   Wt 132 lb 3.2 oz (60 kg)   SpO2 97%   BMI 22.69 kg/m       Objective:   Physical Exam Vitals and nursing note reviewed.  Constitutional:      Appearance: Normal appearance.  Cardiovascular:     Rate and Rhythm: Regular rhythm.  Skin:    General: Skin is warm and dry.     Capillary Refill: Capillary refill takes less than 2 seconds.  Neurological:     General: No focal deficit present.     Mental Status: She is alert and oriented to person, place,   and time.  Psychiatric:        Mood and Affect: Mood normal.        Behavior: Behavior normal.        Thought Content: Thought content normal.        Judgment: Judgment normal.       Assessment & Plan:  1. Diabetes mellitus without complication (HCC)  - POCT glycosylated hemoglobin (Hb A1C)- 7.0 - improved and at goal  - Continue Metformin 1000 mg BID - Follow up in 6 months   Cory Nafziger, NP   

## 2021-05-19 ENCOUNTER — Ambulatory Visit (INDEPENDENT_AMBULATORY_CARE_PROVIDER_SITE_OTHER): Payer: Medicare Other

## 2021-05-19 DIAGNOSIS — J309 Allergic rhinitis, unspecified: Secondary | ICD-10-CM

## 2021-05-25 ENCOUNTER — Ambulatory Visit: Payer: Medicare Other

## 2021-05-25 ENCOUNTER — Other Ambulatory Visit: Payer: Self-pay

## 2021-05-25 ENCOUNTER — Ambulatory Visit
Admission: RE | Admit: 2021-05-25 | Discharge: 2021-05-25 | Disposition: A | Discharge status: Home / Self Care | Payer: Medicare Other | Source: Ambulatory Visit | Attending: Adult Health | Admitting: Adult Health

## 2021-05-25 DIAGNOSIS — Z1231 Encounter for screening mammogram for malignant neoplasm of breast: Secondary | ICD-10-CM | POA: Diagnosis not present

## 2021-05-27 IMAGING — CR DG HIP (WITH OR WITHOUT PELVIS) 2-3V*R*
2 series · 2 of 2 positions shown · non-contrast
Comparison: None.

CLINICAL DATA: 73-year-old female with right hip pain. No known
injury.

EXAM:
DG HIP (WITH OR WITHOUT PELVIS) 2-3V RIGHT

[t hip ap right]
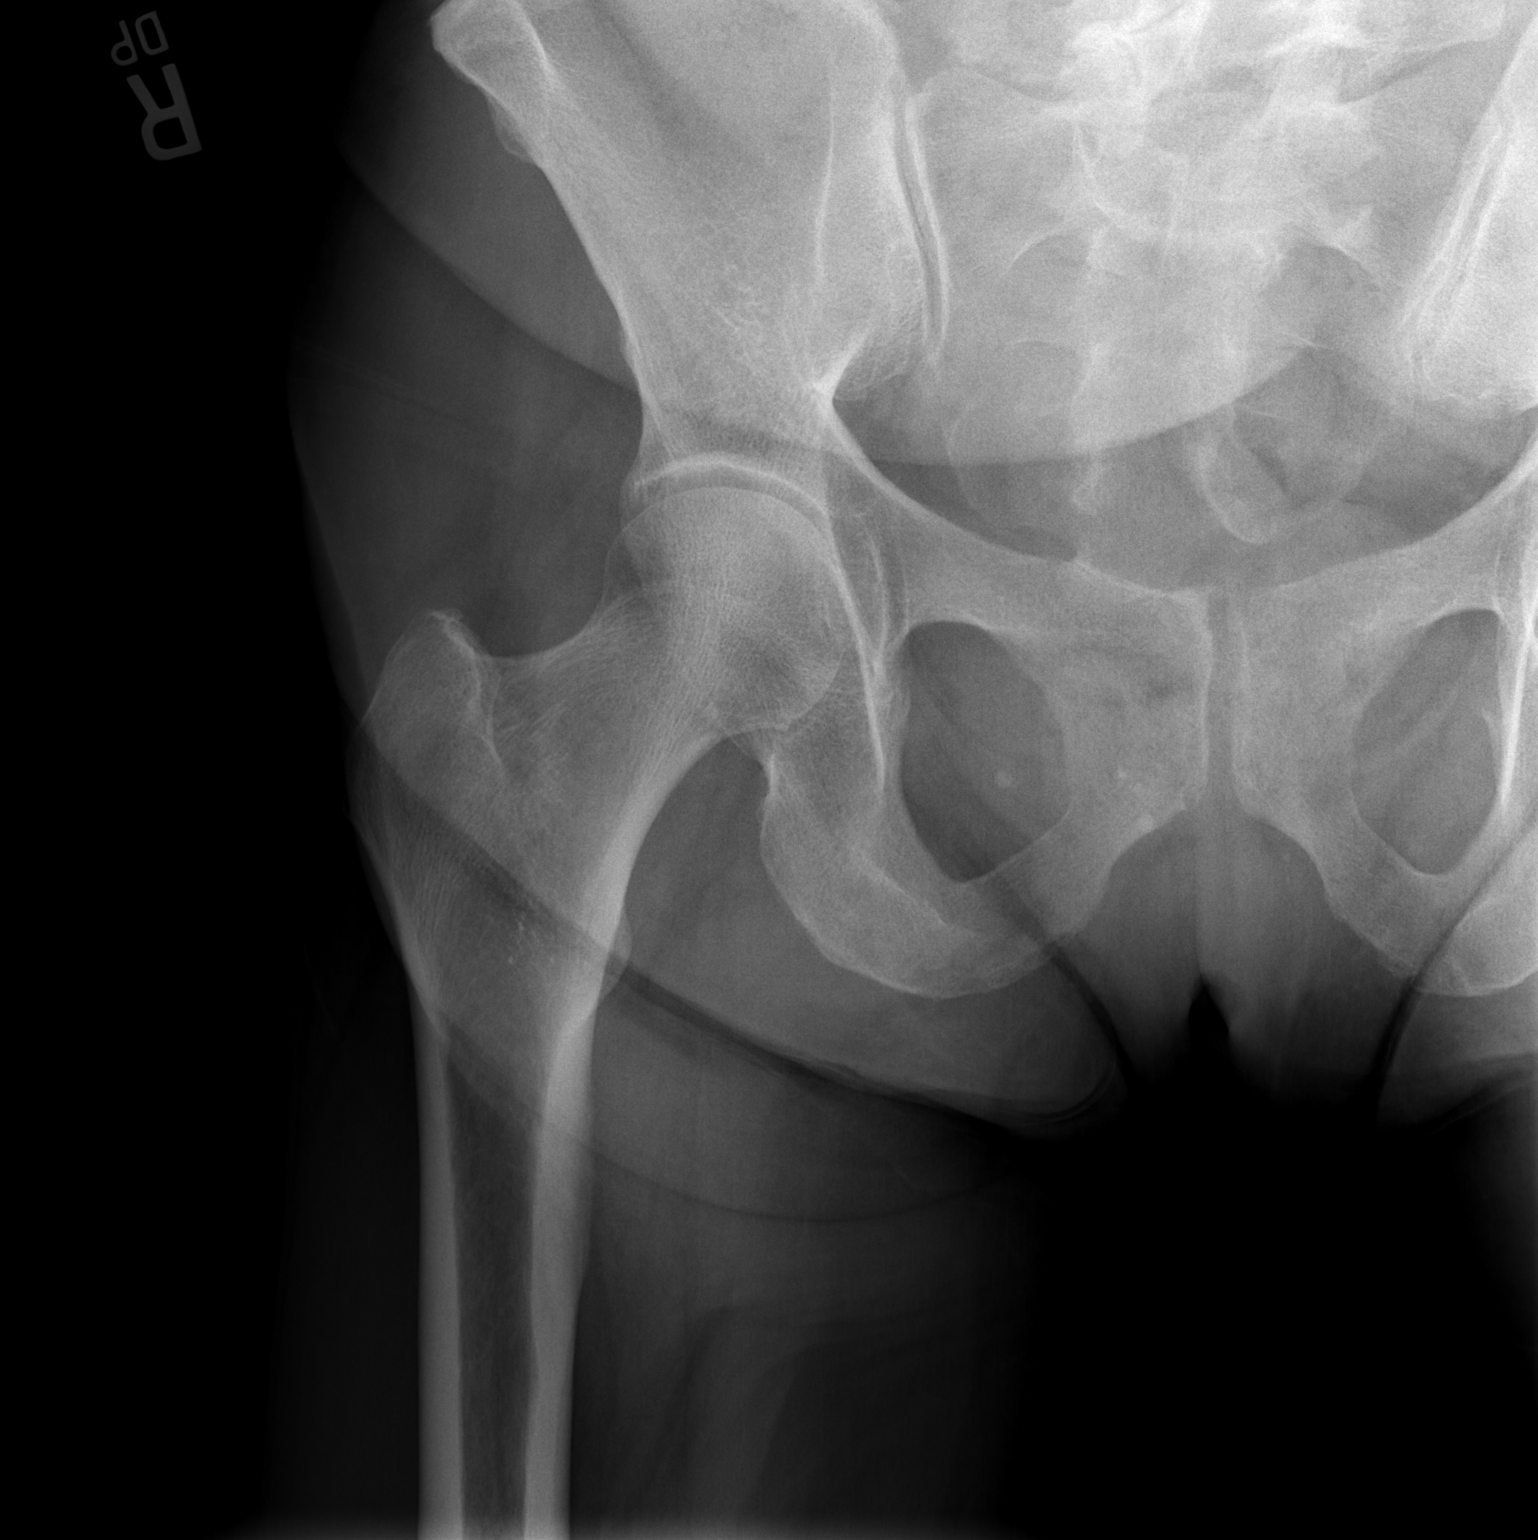

[t hip frog leg right]
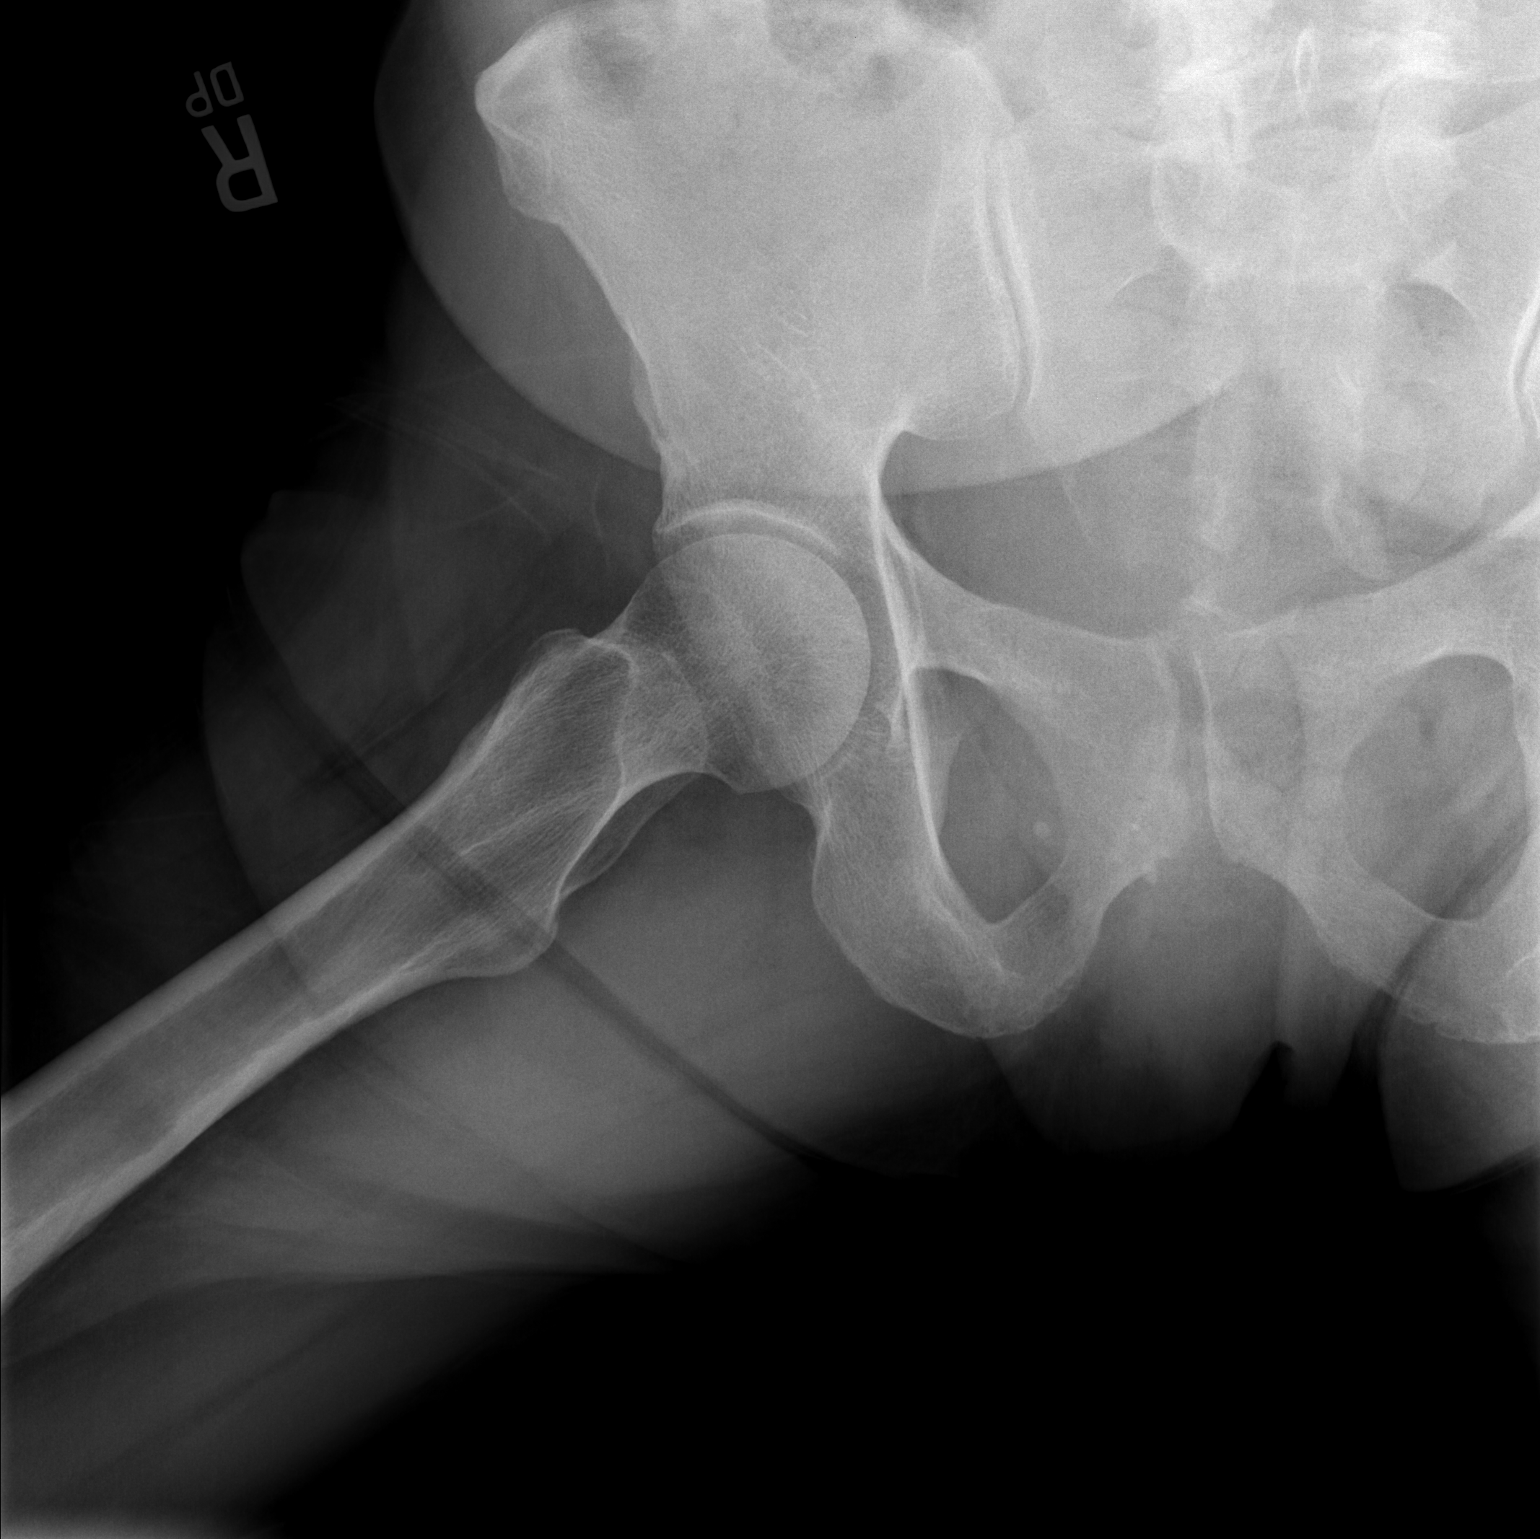

[2 of 2 positions shown; findings below may reference images not displayed]

FINDINGS: There is no acute fracture or dislocation. No significant arthritic
changes. The soft tissues are unremarkable.
IMPRESSION: Negative.

## 2021-06-01 ENCOUNTER — Ambulatory Visit (INDEPENDENT_AMBULATORY_CARE_PROVIDER_SITE_OTHER): Payer: Medicare Other | Admitting: *Deleted

## 2021-06-01 DIAGNOSIS — J309 Allergic rhinitis, unspecified: Secondary | ICD-10-CM

## 2021-06-12 ENCOUNTER — Other Ambulatory Visit: Payer: Self-pay | Admitting: Allergy

## 2021-06-12 DIAGNOSIS — J309 Allergic rhinitis, unspecified: Secondary | ICD-10-CM

## 2021-06-14 ENCOUNTER — Encounter: Payer: Self-pay | Admitting: Adult Health

## 2021-06-14 NOTE — Telephone Encounter (Signed)
Patient was last seen march of 2022 and is due back for an office visit on 09/08/2021. No medications were sent in on her last visit.

## 2021-06-15 ENCOUNTER — Telehealth (INDEPENDENT_AMBULATORY_CARE_PROVIDER_SITE_OTHER): Payer: Medicare Other | Admitting: Family Medicine

## 2021-06-15 DIAGNOSIS — R0981 Nasal congestion: Secondary | ICD-10-CM | POA: Diagnosis not present

## 2021-06-15 DIAGNOSIS — R059 Cough, unspecified: Secondary | ICD-10-CM

## 2021-06-15 MED ORDER — BENZONATATE 100 MG PO CAPS: 100.0000 mg | ORAL_CAPSULE | Freq: Three times a day (TID) | ORAL | 0 refills | Status: DC | PRN

## 2021-06-15 NOTE — Progress Notes (Signed)
Virtual Visit via Video Note  I connected with Kileigh  on 06/15/21 at 12:40 PM EDT by a video enabled telemedicine application and verified that I am speaking with the correct person using two identifiers.  Location patient: home, [place] Location provider:work or home office Persons participating in the virtual visit: patient, provider, patient's husband  I discussed the limitations of evaluation and management by telemedicine and the availability of in person appointments. The patient expressed understanding and agreed to proceed.   HPI:  Acute telemedicine visit for nasal congestion: -Onset:about 5 days ago -Symptoms include: nasal congestion, sneezing, cough, sore throat -Denies: CP, SOB, NVD, inability to eat/drink/get out of bed -husband was sick with similar symptoms recently -Pertinent past medical history: has a hx of allergies and chronic sinus issues -Pertinent medication allergies: Allergies  Allergen Reactions   Peanut-Containing Drug Products Anaphylaxis   Eggs Or Egg-Derived Products Nausea Only    Nausea only- can eat cakes with eggs    Milk-Related Compounds Nausea And Vomiting    Milk and cheese   Actos [Pioglitazone] Nausea Only   Soy Allergy Nausea Only    Pt states had allergy testing and was told allergic to soy-causes nausea but no other reactions   Vicodin [Hydrocodone-Acetaminophen] Nausea Only   -COVID-19 vaccine status: vaccinated and boosted  ROS: See pertinent positives and negatives per HPI.  Past Medical History:  Diagnosis Date   Acid reflux    Allergic rhinitis    Allergy    Anemia    Arthritis    per MD- pt states no s/s of this    Asthma    Bilateral sciatica    Cataract    removed both eyes    Chronic bilateral low back pain    Colon polyps    Diverticulosis    DM type 2 (diabetes mellitus, type 2) (HCC)    Environmental allergies    flowers, pollen, trees   GERD (gastroesophageal reflux disease)    Hyperlipidemia    Low back pain     Migraine    past hx- control as allergies are controlled    Osteoporosis    PONV (postoperative nausea and vomiting)     Past Surgical History:  Procedure Laterality Date   CATARACT EXTRACTION, BILATERAL Bilateral 2012   CESAREAN SECTION     x 2   COLONOSCOPY     > 10 yrs ago- unsure if removed polyps as was told sev.different things after this colon    DENTAL SURGERY     molars removed    DILATION AND CURETTAGE OF UTERUS     NASAL SINUS SURGERY     TUBAL LIGATION       Current Outpatient Medications:    benzonatate (TESSALON PERLES) 100 MG capsule, Take 1 capsule (100 mg total) by mouth 3 (three) times daily as needed., Disp: 20 capsule, Rfl: 0   Alcohol Swabs (B-D SINGLE USE SWABS REGULAR) PADS, USE TO TEST BLOOD GLUCOSE FOUR TIMES DAILY, Disp: 200 each, Rfl: 3   alendronate (FOSAMAX) 70 MG tablet, TAKE 1 TABLET BY MOUTH  WEEKLY WITH 8 OZ OF PLAIN  WATER 30 MINUTES BEFORE  FIRST FOOD, DRINK OR MEDS.  STAY UPRIGHT FOR 30 MINS, Disp: 12 tablet, Rfl: 3   Ascorbic Acid (VITAMIN C) 1000 MG tablet, Take 1,000 mg by mouth as needed., Disp: , Rfl:    Azelastine HCl 0.15 % SOLN, Use 1-2 sprays per nostril twice a day as needed for sinus drainage., Disp: 30 mL,  Rfl: 5   Barberry-Oreg Grape-Goldenseal (BERBERINE COMPLEX PO), Take by mouth daily., Disp: , Rfl:    Blood Glucose Monitoring Suppl (ONETOUCH VERIO FLEX SYSTEM) w/Device KIT, USE AS DIRECTED, Disp: 1 kit, Rfl: 0   Calcium Carbonate-Vitamin D (CALCIUM 600+D PO), Take by mouth., Disp: , Rfl:    CINNAMON PO, Take 1 tablet by mouth as needed., Disp: , Rfl:    Cranberry 450 MG CAPS, Take 1 capsule by mouth as needed., Disp: , Rfl:    Cyanocobalamin (VITAMIN B-12 CR) 1500 MCG TBCR, Take 1 tablet by mouth as needed., Disp: , Rfl:    Echinacea-Goldenseal (ECHINACEA COMB/GOLDEN SEAL PO), Take 1 tablet by mouth as needed. Reported on 12/16/2015, Disp: , Rfl:    EPINEPHrine 0.3 mg/0.3 mL IJ SOAJ injection, Inject 0.3 mg into the muscle as  needed for anaphylaxis., Disp: , Rfl:    fluticasone (FLONASE) 50 MCG/ACT nasal spray, USE 1 SPRAY IN BOTH  NOSTRILS TWICE DAILY, Disp: 48 g, Rfl: 2   gabapentin (NEURONTIN) 300 MG capsule, TAKE 2 CAPSULES BY MOUTH 3  TIMES DAILY, Disp: 540 capsule, Rfl: 3   Green Tea, Camillia sinensis, (GREEN TEA PO), Take by mouth as needed. Reported on 12/16/2015, Disp: , Rfl:    ipratropium (ATROVENT) 0.06 % nasal spray, INSTILL 2 SPRAYS IN BOTH  NOSTRILS 3 TIMES DAILY AS  NEEDED FOR DRAINAGE, Disp: 75 mL, Rfl: 2   Lactobacillus (ACIDOPHILUS PROBIOTIC PO), Take 1 tablet by mouth daily., Disp: , Rfl:    Lancet Devices (ONETOUCH DELICA PLUS LANCING) MISC, USE AS DIRECTED, Disp: 1 each, Rfl: 1   Lancets (ONETOUCH DELICA PLUS HCWCBJ62G) MISC, TEST IN THE MORNING , AT  NOON, IN THE EVENING AND AT BEDTIME, Disp: 400 each, Rfl: 3   levocetirizine (XYZAL) 5 MG tablet, TAKE 1 TABLET BY MOUTH IN  THE EVENING, Disp: 30 tablet, Rfl: 2   MELATONIN ER PO, Take by mouth., Disp: , Rfl:    metFORMIN (GLUCOPHAGE-XR) 500 MG 24 hr tablet, TAKE 2 TABLETS BY MOUTH  TWICE DAILY, Disp: 360 tablet, Rfl: 3   montelukast (SINGULAIR) 10 MG tablet, TAKE 1 TABLET BY MOUTH AT  BEDTIME, Disp: 90 tablet, Rfl: 3   Omega-3 Fatty Acids (OMEGA 3 PO), Take 1 tablet by mouth as needed., Disp: , Rfl:    omeprazole (PRILOSEC) 40 MG capsule, Take 40 mg by mouth daily., Disp: , Rfl:    ONETOUCH VERIO test strip, USE TO TEST BLOOD GLUCOSE 4 TIMES DAILY, Disp: 400 strip, Rfl: 3   Polyethyl Glycol-Propyl Glycol (SYSTANE FREE OP), Apply 1 drop to eye daily. Uses thera work for dr eyes per pt, Disp: , Rfl:    POTASSIUM PO, Take by mouth., Disp: , Rfl:    ramipril (ALTACE) 2.5 MG capsule, TAKE 1 CAPSULE BY MOUTH  DAILY, Disp: 90 capsule, Rfl: 3   simvastatin (ZOCOR) 20 MG tablet, TAKE 1 TABLET BY MOUTH  DAILY, Disp: 90 tablet, Rfl: 3   Spacer/Aero-Holding Chambers (AEROCHAMBER PLUS) inhaler, Use as instructed with MDI, Disp: 1 each, Rfl: 2   Stinging  Nettle 2 % POWD, Take 1 tablet by mouth as needed. Reported on 12/16/2015, Disp: , Rfl:    traZODone (DESYREL) 50 MG tablet, TAKE 1/2 TO 1 TABLET BY  MOUTH AT BEDTIME AS NEEDED  FOR SLEEP, Disp: 90 tablet, Rfl: 3   TURMERIC PO, Take 800 mg by mouth as needed. Reported on 12/16/2015, Disp: , Rfl:    VENTOLIN HFA 108 (90 Base) MCG/ACT inhaler, Inhale  2 puffs into the lungs every 6 (six) hours as needed for wheezing or shortness of breath., Disp: , Rfl:   EXAM:  VITALS per patient if applicable:  GENERAL: alert, oriented, appears well and in no acute distress  HEENT: atraumatic, conjunttiva clear, no obvious abnormalities on inspection of external nose and ears  NECK: normal movements of the head and neck  LUNGS: on inspection no signs of respiratory distress, breathing rate appears normal, no obvious gross SOB, gasping or wheezing  CV: no obvious cyanosis  MS: moves all visible extremities without noticeable abnormality  PSYCH/NEURO: pleasant and cooperative, no obvious depression or anxiety, speech and thought processing grossly intact  ASSESSMENT AND PLAN:  Discussed the following assessment and plan:  Nasal congestion  Cough  -we discussed possible serious and likely etiologies, options for evaluation and workup, limitations of telemedicine visit vs in person visit, treatment, treatment risks and precautions. Pt prefers to treat via telemedicine empirically rather than in person at this moment. Query VURI, Covid19 vs other. She plans to take a home covid test today.  Discussed treatment options, ideal treatment window, potential complications, isolation and precautions for COVID-19 in case of a positive test.  After lengthy discussion, the patient thinks she would like treatment with Molnupiravir if positive due to being higher risk for complications of covid or severe disease and other factors. Discussed EUA status of this drug and the fact that there is preliminary limited knowledge  of risks/interactions/side effects per EUA document vs possible benefits and precautions. She wants to call the [facility] office today if test is positive so that I can send in the medicine - advised she call [facility] beofre 4pm and advise the staff that she needs to get the message to Dr. Maudie Mercury today as is time sensitive.  Did let the patient know I work shifts for [facility] and will not be on shift tomorrow.The patient did want a prescription for cough, Tessalon Rx sent.  Other symptomatic care measures summarized in patient instructions.  Advised to seek prompt in person care if worsening, new symptoms arise, or if is not improving with treatment. Discussed options for inperson care if PCP office not available. Did let this patient know that I only do telemedicine on Tuesdays and Thursdays for [place]. Advised to schedule follow up visit with PCP or [facility] if any further questions or concerns to avoid delays in care.   I discussed the assessment and treatment plan with the patient. The patient was provided an opportunity to ask questions and all were answered. The patient agreed with the plan and demonstrated an understanding of the instructions.     Lucretia Kern, DO

## 2021-06-15 NOTE — Patient Instructions (Signed)
  HOME CARE TIPS:  -[facility] testing information: [email or web address] OR [phone] Most pharmacies also offer testing and home test kits. If the Covid19 test is positive, please make a prompt follow up visit with your primary care office or with [facility] to discuss treatment options. Treatments for Covid19 are best given early in the course of the illness.   -I sent the medication(s) we discussed to your pharmacy: Meds ordered this encounter  Medications   benzonatate (TESSALON PERLES) 100 MG capsule    Sig: Take 1 capsule (100 mg total) by mouth 3 (three) times daily as needed.    Dispense:  20 capsule    Refill:  0     -can use nasal saline a few times per day if you have nasal congestion  -stay hydrated, drink plenty of fluids and eat small healthy meals - avoid dairy  -can take 1000 IU (77mcg) Vit D3 and 100-500 mg of Vit C daily per instructions  -If the Covid test is positive, check out the [facility] website for more information on home care, transmission and treatment for COVID19  -follow up with your doctor in 2-3 days unless improving and feeling better  -stay home while sick, except to seek medical care. If you have COVID19, ideally it would be best to stay home for a full 10 days since the onset of symptoms PLUS one day of no fever and feeling better. Wear a good mask that fits snugly (such as N95 or KN95) if around others to reduce the risk of transmission.  It was nice to meet you today, and I really hope you are feeling better soon. I help [facility] out with telemedicine visits on Tuesdays and Thursdays and am available for visits on those days. If you have any concerns or questions following this visit please schedule a follow up visit with your Primary Care doctor or seek care at a local urgent care clinic to avoid delays in care.    Seek in person care or schedule a follow up video visit promptly if your symptoms worsen,  new concerns arise or you are not improving with treatment. Call 911 and/or seek emergency care if your symptoms are severe or life threatening.

## 2021-06-17 ENCOUNTER — Ambulatory Visit (INDEPENDENT_AMBULATORY_CARE_PROVIDER_SITE_OTHER): Payer: Medicare Other | Admitting: *Deleted

## 2021-06-17 DIAGNOSIS — J309 Allergic rhinitis, unspecified: Secondary | ICD-10-CM

## 2021-06-29 ENCOUNTER — Ambulatory Visit (INDEPENDENT_AMBULATORY_CARE_PROVIDER_SITE_OTHER): Payer: Medicare Other | Admitting: *Deleted

## 2021-06-29 DIAGNOSIS — J309 Allergic rhinitis, unspecified: Secondary | ICD-10-CM

## 2021-06-30 DIAGNOSIS — J3081 Allergic rhinitis due to animal (cat) (dog) hair and dander: Secondary | ICD-10-CM

## 2021-06-30 NOTE — Progress Notes (Signed)
VIALS MADE. EXP 06-30-22 

## 2021-07-01 DIAGNOSIS — J301 Allergic rhinitis due to pollen: Secondary | ICD-10-CM | POA: Diagnosis not present

## 2021-07-02 DIAGNOSIS — J302 Other seasonal allergic rhinitis: Secondary | ICD-10-CM | POA: Diagnosis not present

## 2021-07-03 ENCOUNTER — Encounter: Payer: Self-pay | Admitting: Adult Health

## 2021-07-13 ENCOUNTER — Ambulatory Visit (INDEPENDENT_AMBULATORY_CARE_PROVIDER_SITE_OTHER): Payer: Medicare Other | Admitting: *Deleted

## 2021-07-13 DIAGNOSIS — J309 Allergic rhinitis, unspecified: Secondary | ICD-10-CM | POA: Diagnosis not present

## 2021-07-19 ENCOUNTER — Ambulatory Visit: Payer: Medicare Other

## 2021-07-21 ENCOUNTER — Encounter: Payer: Self-pay | Admitting: Family Medicine

## 2021-07-21 ENCOUNTER — Other Ambulatory Visit: Payer: Self-pay

## 2021-07-21 ENCOUNTER — Ambulatory Visit (INDEPENDENT_AMBULATORY_CARE_PROVIDER_SITE_OTHER): Payer: Medicare Other | Admitting: Family Medicine

## 2021-07-21 ENCOUNTER — Ambulatory Visit (INDEPENDENT_AMBULATORY_CARE_PROVIDER_SITE_OTHER)
Admission: RE | Admit: 2021-07-21 | Discharge: 2021-07-21 | Disposition: A | Discharge status: Home / Self Care | Payer: Medicare Other | Source: Ambulatory Visit | Attending: Family Medicine | Admitting: Family Medicine

## 2021-07-21 VITALS — BP 110/50 | HR 76 | Temp 98.6°F | Wt 132.6 lb

## 2021-07-21 DIAGNOSIS — M25561 Pain in right knee: Secondary | ICD-10-CM

## 2021-07-21 DIAGNOSIS — M1711 Unilateral primary osteoarthritis, right knee: Secondary | ICD-10-CM | POA: Diagnosis not present

## 2021-07-21 NOTE — Progress Notes (Signed)
Established Patient Office Visit  Subjective:  Patient ID: Kimberly Lam, female    DOB: 20-Sep-1946  Age: 75 y.o. MRN: [id number]  CC:  Chief Complaint  Patient presents with   Fall    Slipped on a wet spot in a store yesterday. Landed directly on the right knee, very painful, was swollen but swelling is going down per pt, rigth hip and back pain, neck and shoulder pain    HPI Kimberly Lam presents for right knee pain following a fall yesterday.  She states she was at [facility] and some type of food had fallen into the floor and apparently there is a slick spot.  She fell very suddenly and landed directly onto her right knee.  He had little bit of right lower lumbar pain and mild right upper back pain but most of her pain is right anterior knee.  No significant swelling.  No hip pain with weightbearing.  Ambulating without assistance.  Denies any upper extremity injury.  No head injury.  No loss of consciousness.  Past Medical History:  Diagnosis Date   Acid reflux    Allergic rhinitis    Allergy    Anemia    Arthritis    per MD- pt states no s/s of this    Asthma    Bilateral sciatica    Cataract    removed both eyes    Chronic bilateral low back pain    Colon polyps    Diverticulosis    DM type 2 (diabetes mellitus, type 2) (HCC)    Environmental allergies    flowers, pollen, trees   GERD (gastroesophageal reflux disease)    Hyperlipidemia    Low back pain    Migraine    past hx- control as allergies are controlled    Osteoporosis    PONV (postoperative nausea and vomiting)     Past Surgical History:  Procedure Laterality Date   CATARACT EXTRACTION, BILATERAL Bilateral 2012   CESAREAN SECTION     x 2   COLONOSCOPY     > 10 yrs ago- unsure if removed polyps as was told sev.different things after this colon    DENTAL SURGERY     molars removed    DILATION AND CURETTAGE OF UTERUS     NASAL SINUS SURGERY     TUBAL LIGATION      Family History   Problem Relation Age of Onset   Asthma Father    Emphysema Father    Alcohol abuse Father    Diabetes Mellitus II Brother        x 2   Hyperlipidemia Brother    Hypertension Brother    Pancreatic cancer Brother    Prostate cancer Brother    Diabetes Brother    Heart disease Maternal Grandmother        hardening of the arteries   Heart murmur Daughter    Glaucoma Brother    Breast cancer Neg Hx    Colon cancer Neg Hx    Colon polyps Neg Hx    Esophageal cancer Neg Hx    Rectal cancer Neg Hx    Stomach cancer Neg Hx     Social History   Socioeconomic History   Marital status: Married    Spouse name: Not on file   Number of children: 2   Years of education: 16   Highest education level: Not on file  Occupational History   Occupation: Retired  Tobacco Use   Smoking  status: Never   Smokeless tobacco: Never  Vaping Use   Vaping Use: Never used  Substance and Sexual Activity   Alcohol use: No    Alcohol/week: 0.0 standard drinks   Drug use: No   Sexual activity: Not on file  Other Topics Concern   Not on file  Social History Narrative      Married 1 son one daughter, relocated to [place] after living in [place] for many years. She is a Writer of [employer].   No caffeine.          Lives at home with her husband and brother.   Right-handed.   No caffeine use.   Social Determinants of Health   Financial Resource Strain: Low Risk    Difficulty of Paying Living Expenses: Not hard at all  Food Insecurity: Not on file  Transportation Needs: Unknown   Lack of Transportation (Medical): No   Lack of Transportation (Non-Medical): Not on file  Physical Activity: Insufficiently Active   Days of Exercise per Week: 3 days   Minutes of Exercise per Session: 30 min  Stress: No Stress Concern Present   Feeling of Stress : Not at all  Social Connections: Moderately Integrated   Frequency of Communication with Friends and Family: More than three  times a week   Frequency of Social Gatherings with Friends and Family: Once a week   Attends Religious Services: More than 4 times per year   Active Member of [employer] or Organizations: No   Attends Archivist Meetings: Never   Marital Status: Married  Human resources officer Violence: Not At Risk   Fear of Current or Ex-Partner: No   Emotionally Abused: No   Physically Abused: No   Sexually Abused: No    Outpatient Medications Prior to Visit  Medication Sig Dispense Refill   Alcohol Swabs (B-D SINGLE USE SWABS REGULAR) PADS USE TO TEST BLOOD GLUCOSE FOUR TIMES DAILY 200 each 3   alendronate (FOSAMAX) 70 MG tablet TAKE 1 TABLET BY MOUTH  WEEKLY WITH 8 OZ OF PLAIN  WATER 30 MINUTES BEFORE  FIRST FOOD, [place] OR MEDS.  STAY UPRIGHT FOR 30 MINS 12 tablet 3   Ascorbic Acid (VITAMIN C) 1000 MG tablet Take 1,000 mg by mouth as needed.     Azelastine HCl 0.15 % SOLN Use 1-2 sprays per nostril twice a day as needed for sinus drainage. 30 mL 5   Barberry-Oreg Grape-Goldenseal (BERBERINE COMPLEX PO) Take by mouth daily.     benzonatate (TESSALON PERLES) 100 MG capsule Take 1 capsule (100 mg total) by mouth 3 (three) times daily as needed. 20 capsule 0   Blood Glucose Monitoring Suppl (ONETOUCH VERIO FLEX SYSTEM) w/Device KIT USE AS DIRECTED 1 kit 0   Calcium Carbonate-Vitamin D (CALCIUM 600+D PO) Take by mouth.     CINNAMON PO Take 1 tablet by mouth as needed.     Cranberry 450 MG CAPS Take 1 capsule by mouth as needed.     Cyanocobalamin (VITAMIN B-12 CR) 1500 MCG TBCR Take 1 tablet by mouth as needed.     Echinacea-Goldenseal (ECHINACEA COMB/GOLDEN SEAL PO) Take 1 tablet by mouth as needed. Reported on 12/16/2015     EPINEPHrine 0.3 mg/0.3 mL IJ SOAJ injection Inject 0.3 mg into the muscle as needed for anaphylaxis.     fluticasone (FLONASE) 50 MCG/ACT nasal spray USE 1 SPRAY IN BOTH  NOSTRILS TWICE DAILY 48 g 2   gabapentin (NEURONTIN) 300 MG capsule  TAKE 2 CAPSULES BY MOUTH 3  TIMES DAILY 540  capsule 3   Green Tea, Camillia sinensis, (GREEN TEA PO) Take by mouth as needed. Reported on 12/16/2015     ipratropium (ATROVENT) 0.06 % nasal spray INSTILL 2 SPRAYS IN BOTH  NOSTRILS 3 TIMES DAILY AS  NEEDED FOR DRAINAGE 75 mL 2   Lactobacillus (ACIDOPHILUS PROBIOTIC PO) Take 1 tablet by mouth daily.     Lancet Devices (ONETOUCH DELICA PLUS LANCING) MISC USE AS DIRECTED 1 each 1   Lancets (ONETOUCH DELICA PLUS SPQZRA07M) MISC TEST IN THE MORNING , AT  NOON, IN THE EVENING AND AT BEDTIME 400 each 3   levocetirizine (XYZAL) 5 MG tablet TAKE 1 TABLET BY MOUTH IN  THE EVENING 30 tablet 2   MELATONIN ER PO Take by mouth.     metFORMIN (GLUCOPHAGE-XR) 500 MG 24 hr tablet TAKE 2 TABLETS BY MOUTH  TWICE DAILY 360 tablet 3   montelukast (SINGULAIR) 10 MG tablet TAKE 1 TABLET BY MOUTH AT  BEDTIME 90 tablet 3   Omega-3 Fatty Acids (OMEGA 3 PO) Take 1 tablet by mouth as needed.     omeprazole (PRILOSEC) 40 MG capsule Take 40 mg by mouth daily.     ONETOUCH VERIO test strip USE TO TEST BLOOD GLUCOSE 4 TIMES DAILY 400 strip 3   Polyethyl Glycol-Propyl Glycol (SYSTANE FREE OP) Apply 1 drop to eye daily. Uses thera work for dr eyes per pt     POTASSIUM PO Take by mouth.     ramipril (ALTACE) 2.5 MG capsule TAKE 1 CAPSULE BY MOUTH  DAILY 90 capsule 3   simvastatin (ZOCOR) 20 MG tablet TAKE 1 TABLET BY MOUTH  DAILY 90 tablet 3   Spacer/Aero-Holding Chambers (AEROCHAMBER PLUS) inhaler Use as instructed with MDI 1 each 2   Stinging Nettle 2 % POWD Take 1 tablet by mouth as needed. Reported on 12/16/2015     traZODone (DESYREL) 50 MG tablet TAKE 1/2 TO 1 TABLET BY  MOUTH AT BEDTIME AS NEEDED  FOR SLEEP 90 tablet 3   TURMERIC PO Take 800 mg by mouth as needed. Reported on 12/16/2015     VENTOLIN HFA 108 (90 Base) MCG/ACT inhaler Inhale 2 puffs into the lungs every 6 (six) hours as needed for wheezing or shortness of breath.     No facility-administered medications prior to visit.    Allergies  Allergen  Reactions   Peanut-Containing Drug Products Anaphylaxis   Eggs Or Egg-Derived Products Nausea Only    Nausea only- can eat cakes with eggs    Milk-Related Compounds Nausea And Vomiting    Milk and cheese   Actos [Pioglitazone] Nausea Only   Soy Allergy Nausea Only    Pt states had allergy testing and was told allergic to soy-causes nausea but no other reactions   Vicodin [Hydrocodone-Acetaminophen] Nausea Only    ROS Review of Systems  Constitutional:  Negative for chills and fever.  Neurological:  Negative for weakness and headaches.     Objective:    Physical Exam Vitals reviewed.  Cardiovascular:     Rate and Rhythm: Normal rate.  Musculoskeletal:     Comments: Right knee reveals no effusion.  No significant ecchymosis.  No medial or lateral joint line tenderness.  Full range of motion.  She does have some mild tenderness over the superior aspect of the patella.  No palpable deformity.  Right hip reveals excellent range of motion with internal and external rotation.  No lateral hip  tenderness.  Neurological:     Mental Status: She is alert.    BP (!) 110/50 (BP Location: Left Arm, Patient Position: Sitting, Cuff Size: Normal)   Pulse 76   Temp 98.6 F (37 C) (Oral)   Wt 132 lb 9.6 oz (60.1 kg)   SpO2 98%   BMI 22.76 kg/m  Wt Readings from Last 3 Encounters:  07/21/21 132 lb 9.6 oz (60.1 kg)  05/12/21 132 lb 3.2 oz (60 kg)  04/15/21 134 lb (60.8 kg)     Health Maintenance Due  Topic Date Due   TETANUS/TDAP  Never done   Zoster Vaccines- Shingrix (1 of 2) Never done   PNA vac Low Risk Adult (2 of 2 - PPSV23) 01/27/2015   INFLUENZA VACCINE  07/19/2021    There are no preventive care reminders to display for this patient.  Lab Results  Component Value Date   TSH 1.13 02/11/2021   Lab Results  Component Value Date   WBC 3.4 (L) 02/11/2021   HGB 13.6 02/11/2021   HCT 41.2 02/11/2021   MCV 91.8 02/11/2021   PLT 231.0 02/11/2021   Lab Results   Component Value Date   NA 140 02/11/2021   K 4.8 02/11/2021   CO2 30 02/11/2021   GLUCOSE 118 (H) 02/11/2021   BUN 15 02/11/2021   CREATININE 0.86 02/11/2021   BILITOT 0.4 02/11/2021   ALKPHOS 48 02/11/2021   AST 14 02/11/2021   ALT 11 02/11/2021   PROT 7.4 02/11/2021   ALBUMIN 4.3 02/11/2021   CALCIUM 10.2 02/11/2021   GFR 67.01 02/11/2021   Lab Results  Component Value Date   CHOL 175 02/11/2021   Lab Results  Component Value Date   HDL 69.00 02/11/2021   Lab Results  Component Value Date   LDLCALC 85 02/11/2021   Lab Results  Component Value Date   TRIG 104.0 02/11/2021   Lab Results  Component Value Date   CHOLHDL 3 02/11/2021   Lab Results  Component Value Date   HGBA1C 7.0 (A) 05/12/2021      Assessment & Plan:   Problem List Items Addressed This Visit   None Visit Diagnoses     Acute pain of right knee    -  Primary   Relevant Orders   DG Knee 4 Views W/Patella Right     Suspect patellar contusion.  Fairly low suspicion clinically for fracture. -Obtain x-rays right knee to further assess -If negative, she can try some icing and discussed the fact that bone contusions can sometimes take several weeks to heal  No orders of the defined types were placed in this encounter.   Follow-up: No follow-ups on file.    Carolann Littler, MD

## 2021-07-21 NOTE — Patient Instructions (Signed)
Go for X-ray at [facility] in the basement.

## 2021-07-27 ENCOUNTER — Ambulatory Visit (INDEPENDENT_AMBULATORY_CARE_PROVIDER_SITE_OTHER): Payer: Medicare Other | Admitting: *Deleted

## 2021-07-27 DIAGNOSIS — J309 Allergic rhinitis, unspecified: Secondary | ICD-10-CM | POA: Diagnosis not present

## 2021-08-03 ENCOUNTER — Ambulatory Visit (INDEPENDENT_AMBULATORY_CARE_PROVIDER_SITE_OTHER): Payer: Medicare Other | Admitting: *Deleted

## 2021-08-03 DIAGNOSIS — J309 Allergic rhinitis, unspecified: Secondary | ICD-10-CM

## 2021-08-10 ENCOUNTER — Ambulatory Visit (INDEPENDENT_AMBULATORY_CARE_PROVIDER_SITE_OTHER): Payer: Medicare Other | Admitting: *Deleted

## 2021-08-10 DIAGNOSIS — J309 Allergic rhinitis, unspecified: Secondary | ICD-10-CM | POA: Diagnosis not present

## 2021-08-17 ENCOUNTER — Ambulatory Visit (INDEPENDENT_AMBULATORY_CARE_PROVIDER_SITE_OTHER): Payer: Medicare Other | Admitting: *Deleted

## 2021-08-17 DIAGNOSIS — J309 Allergic rhinitis, unspecified: Secondary | ICD-10-CM

## 2021-08-22 ENCOUNTER — Other Ambulatory Visit: Payer: Self-pay | Admitting: Allergy

## 2021-08-22 DIAGNOSIS — J309 Allergic rhinitis, unspecified: Secondary | ICD-10-CM

## 2021-08-24 ENCOUNTER — Ambulatory Visit (INDEPENDENT_AMBULATORY_CARE_PROVIDER_SITE_OTHER): Payer: Medicare Other

## 2021-08-24 DIAGNOSIS — J309 Allergic rhinitis, unspecified: Secondary | ICD-10-CM

## 2021-09-01 ENCOUNTER — Other Ambulatory Visit: Payer: Self-pay | Admitting: Adult Health

## 2021-09-01 ENCOUNTER — Other Ambulatory Visit: Payer: Self-pay | Admitting: Allergy

## 2021-09-01 DIAGNOSIS — E119 Type 2 diabetes mellitus without complications: Secondary | ICD-10-CM

## 2021-09-01 DIAGNOSIS — J309 Allergic rhinitis, unspecified: Secondary | ICD-10-CM

## 2021-09-06 ENCOUNTER — Ambulatory Visit: Payer: Medicare Other

## 2021-09-07 ENCOUNTER — Ambulatory Visit (INDEPENDENT_AMBULATORY_CARE_PROVIDER_SITE_OTHER): Payer: Medicare Other | Admitting: *Deleted

## 2021-09-07 DIAGNOSIS — Z961 Presence of intraocular lens: Secondary | ICD-10-CM | POA: Diagnosis not present

## 2021-09-07 DIAGNOSIS — H16223 Keratoconjunctivitis sicca, not specified as Sjogren's, bilateral: Secondary | ICD-10-CM | POA: Diagnosis not present

## 2021-09-07 DIAGNOSIS — J309 Allergic rhinitis, unspecified: Secondary | ICD-10-CM | POA: Diagnosis not present

## 2021-09-07 DIAGNOSIS — E119 Type 2 diabetes mellitus without complications: Secondary | ICD-10-CM | POA: Diagnosis not present

## 2021-09-08 ENCOUNTER — Ambulatory Visit: Payer: Medicare Other | Admitting: Allergy

## 2021-09-09 ENCOUNTER — Ambulatory Visit (INDEPENDENT_AMBULATORY_CARE_PROVIDER_SITE_OTHER): Payer: Medicare Other

## 2021-09-09 DIAGNOSIS — Z0000 Encounter for general adult medical examination without abnormal findings: Secondary | ICD-10-CM

## 2021-09-09 NOTE — Progress Notes (Signed)
Subjective:   Kimberly Lam is a 75 y.o. female who presents for Medicare Annual (Subsequent) preventive examination.  I connected with Janace Decker today by telephone and verified that I am speaking with the correct person using two identifiers. Location patient: home Location provider: work Persons participating in the virtual visit: patient, provider.   I discussed the limitations, risks, security and privacy concerns of performing an evaluation and management service by telephone and the availability of in person appointments. I also discussed with the patient that there may be a patient responsible charge related to this service. The patient expressed understanding and verbally consented to this telephonic visit.    Interactive audio and video telecommunications were attempted between this provider and patient, however failed, due to patient having technical difficulties OR patient did not have access to video capability.  We continued and completed visit with audio only.    Review of Systems    N/a       Objective:    There were no vitals filed for this visit. There is no height or weight on file to calculate BMI.  Advanced Directives 10/12/2020 09/03/2020 07/13/2020 01/14/2020 04/11/2019 03/12/2019 09/21/2015  Does Patient Have a Medical Advance Directive? No No No No No No No  Does patient want to make changes to medical advance directive? No - Patient declined - - No - Patient declined - - -  Would patient like information on creating a medical advance directive? - No - Patient declined No - Patient declined - No - Guardian declined No - Patient declined -    Current Medications (verified) Outpatient Encounter Medications as of 09/09/2021  Medication Sig   Alcohol Swabs (B-D SINGLE USE SWABS REGULAR) PADS USE TO TEST BLOOD GLUCOSE FOUR TIMES DAILY   alendronate (FOSAMAX) 70 MG tablet TAKE 1 TABLET BY MOUTH  WEEKLY WITH 8 OZ OF PLAIN  WATER 30 MINUTES BEFORE  FIRST FOOD,  DRINK OR MEDS.  STAY UPRIGHT FOR 30 MINS   Ascorbic Acid (VITAMIN C) 1000 MG tablet Take 1,000 mg by mouth as needed.   Azelastine HCl 0.15 % SOLN Use 1-2 sprays per nostril twice a day as needed for sinus drainage.   Barberry-Oreg Grape-Goldenseal (BERBERINE COMPLEX PO) Take by mouth daily.   benzonatate (TESSALON PERLES) 100 MG capsule Take 1 capsule (100 mg total) by mouth 3 (three) times daily as needed.   Blood Glucose Monitoring Suppl (ONETOUCH VERIO FLEX SYSTEM) w/Device KIT USE AS DIRECTED   Calcium Carbonate-Vitamin D (CALCIUM 600+D PO) Take by mouth.   CINNAMON PO Take 1 tablet by mouth as needed.   Cranberry 450 MG CAPS Take 1 capsule by mouth as needed.   Cyanocobalamin (VITAMIN B-12 CR) 1500 MCG TBCR Take 1 tablet by mouth as needed.   Echinacea-Goldenseal (ECHINACEA COMB/GOLDEN SEAL PO) Take 1 tablet by mouth as needed. Reported on 12/16/2015   EPINEPHrine 0.3 mg/0.3 mL IJ SOAJ injection Inject 0.3 mg into the muscle as needed for anaphylaxis.   fluticasone (FLONASE) 50 MCG/ACT nasal spray USE 1 SPRAY IN BOTH  NOSTRILS TWICE DAILY   gabapentin (NEURONTIN) 300 MG capsule TAKE 2 CAPSULES BY MOUTH 3  TIMES DAILY   Green Tea, Camillia sinensis, (GREEN TEA PO) Take by mouth as needed. Reported on 12/16/2015   ipratropium (ATROVENT) 0.06 % nasal spray INSTILL 2 SPRAYS IN BOTH  NOSTRILS 3 TIMES DAILY AS  NEEDED FOR DRAINAGE   Lactobacillus (ACIDOPHILUS PROBIOTIC PO) Take 1 tablet by mouth daily.   Lancet  Devices (ONETOUCH DELICA PLUS LANCING) MISC USE AS DIRECTED   Lancets (ONETOUCH DELICA PLUS [id number]) MISC TEST IN THE MORNING , AT  NOON, IN THE EVENING AND AT BEDTIME   levocetirizine (XYZAL) 5 MG tablet TAKE 1 TABLET BY MOUTH IN  THE EVENING   MELATONIN ER PO Take by mouth.   metFORMIN (GLUCOPHAGE-XR) 500 MG 24 hr tablet TAKE 2 TABLETS BY MOUTH  TWICE DAILY   montelukast (SINGULAIR) 10 MG tablet TAKE 1 TABLET BY MOUTH AT  BEDTIME   Omega-3 Fatty Acids (OMEGA 3 PO) Take 1 tablet by  mouth as needed.   omeprazole (PRILOSEC) 40 MG capsule Take 40 mg by mouth daily.   ONETOUCH VERIO test strip USE TO TEST BLOOD GLUCOSE 4 TIMES DAILY   Polyethyl Glycol-Propyl Glycol (SYSTANE FREE OP) Apply 1 drop to eye daily. Uses thera work for dr eyes per pt   POTASSIUM PO Take by mouth.   ramipril (ALTACE) 2.5 MG capsule TAKE 1 CAPSULE BY MOUTH  DAILY   simvastatin (ZOCOR) 20 MG tablet TAKE 1 TABLET BY MOUTH  DAILY   Spacer/Aero-Holding Chambers (AEROCHAMBER PLUS) inhaler Use as instructed with MDI   Stinging Nettle 2 % POWD Take 1 tablet by mouth as needed. Reported on 12/16/2015   traZODone (DESYREL) 50 MG tablet TAKE 1/2 TO 1 TABLET BY  MOUTH AT BEDTIME AS NEEDED  FOR SLEEP   TURMERIC PO Take 800 mg by mouth as needed. Reported on 12/16/2015   VENTOLIN HFA 108 (90 Base) MCG/ACT inhaler Inhale 2 puffs into the lungs every 6 (six) hours as needed for wheezing or shortness of breath.   No facility-administered encounter medications on file as of 09/09/2021.    Allergies (verified) Peanut-containing drug products, Eggs or egg-derived products, Milk-related compounds, Actos [pioglitazone], Soy allergy, and Vicodin [hydrocodone-acetaminophen]   History: Past Medical History:  Diagnosis Date   Acid reflux    Allergic rhinitis    Allergy    Anemia    Arthritis    per MD- pt states no s/s of this    Asthma    Bilateral sciatica    Cataract    removed both eyes    Chronic bilateral low back pain    Colon polyps    Diverticulosis    DM type 2 (diabetes mellitus, type 2) (HCC)    Environmental allergies    flowers, pollen, trees   GERD (gastroesophageal reflux disease)    Hyperlipidemia    Low back pain    Migraine    past hx- control as allergies are controlled    Osteoporosis    PONV (postoperative nausea and vomiting)    Past Surgical History:  Procedure Laterality Date   CATARACT EXTRACTION, BILATERAL Bilateral 2012   CESAREAN SECTION     x 2   COLONOSCOPY     > 10  yrs ago- unsure if removed polyps as was told sev.different things after this colon    DENTAL SURGERY     molars removed    DILATION AND CURETTAGE OF UTERUS     NASAL SINUS SURGERY     TUBAL LIGATION     Family History  Problem Relation Age of Onset   Asthma Father    Emphysema Father    Alcohol abuse Father    Diabetes Mellitus II Brother        x 2   Hyperlipidemia Brother    Hypertension Brother    Pancreatic cancer Brother    Prostate cancer Brother  Diabetes Brother    Heart disease Maternal Grandmother        hardening of the arteries   Heart murmur Daughter    Glaucoma Brother    Breast cancer Neg Hx    Colon cancer Neg Hx    Colon polyps Neg Hx    Esophageal cancer Neg Hx    Rectal cancer Neg Hx    Stomach cancer Neg Hx    Social History   Socioeconomic History   Marital status: Married    Spouse name: Not on file   Number of children: 2   Years of education: 16   Highest education level: Not on file  Occupational History   Occupation: Retired  Tobacco Use   Smoking status: Never   Smokeless tobacco: Never  Vaping Use   Vaping Use: Never used  Substance and Sexual Activity   Alcohol use: No    Alcohol/week: 0.0 standard drinks   Drug use: No   Sexual activity: Not on file  Other Topics Concern   Not on file  Social History Narrative      Married 1 son one daughter, relocated to [place] after living in [place] for many years. She is a Writer of [employer]   No caffeine.          Lives at home with her husband and brother.   Right-handed.   No caffeine use.   Social Determinants of Health   Financial Resource Strain: Not on file  Food Insecurity: Not on file  Transportation Needs: Unknown   Lack of Transportation (Medical): No   Lack of Transportation (Non-Medical): Not on file  Physical Activity: Not on file  Stress: Not on file  Social Connections: Not on file    Tobacco Counseling Counseling given: Not  Answered   Clinical Intake:                 Diabetic?yes Nutrition Risk Assessment:  Has the patient had any N/V/D within the last 2 months?  No  Does the patient have any non-healing wounds?  No  Has the patient had any unintentional weight loss or weight gain?  No   Diabetes:  Is the patient diabetic?  Yes  If diabetic, was a CBG obtained today?  No  Did the patient bring in their glucometer from home?  No  How often do you monitor your CBG's? 2 x day .   Financial Strains and Diabetes Management:  Are you having any financial strains with the device, your supplies or your medication? No .  Does the patient want to be seen by Chronic Care Management for management of their diabetes?  No  Would the patient like to be referred to a Nutritionist or for Diabetic Management?  No   Diabetic Exams:  Diabetic Eye Exam: Completed 09/08/2021 Diabetic Foot Exam: Overdue, Pt has been advised about the importance in completing this exam. Pt is scheduled for diabetic foot exam on next office visit .          Activities of Daily Living No flowsheet data found.  Patient Care Team: [name], NP as PCP - General (Family Medicine) [place], Eppie Gibson, RN as [place] any recent [place] you may have received from other than [facility] providers in the past year (date may be approximate).     Assessment:   This is a routine wellness examination for Nisha.  Hearing/Vision screen No results found.  Dietary issues and exercise activities discussed:     Goals Addressed   None    Depression Screen PHQ 2/9 Scores 02/11/2021 09/03/2020 01/14/2020 04/11/2019 03/12/2019 10/18/2018 06/13/2017  PHQ - 2 Score 0 0 0 0 0 0 0  PHQ- 9 Score - 0 - - - - -    Fall Risk Fall Risk  03/22/2021 02/11/2021 12/21/2020 10/12/2020 09/03/2020  Falls in the past year? 0 0 0 0 1  Number falls in past yr: 0 - 0 0 0  Injury with Fall? 0 - 0 0 0  Risk  for fall due to : - - - - History of fall(s)  Follow up Falls evaluation completed - Falls evaluation completed Falls evaluation completed Falls evaluation completed;Falls prevention discussed    FALL RISK PREVENTION PERTAINING TO THE HOME:  Any stairs in or around the home? Yes  If so, are there any without handrails? No  Home free of loose throw rugs in walkways, pet beds, electrical cords, etc? Yes  Adequate lighting in your home to reduce risk of falls? Yes   ASSISTIVE DEVICES UTILIZED TO PREVENT FALLS:  Life alert? No  Use of a cane, walker or w/c? No  Grab bars in the bathroom? Yes  Shower chair or bench in shower? No  Elevated toilet seat or a handicapped toilet? Yes    Cognitive Function:  Normal cognitive status assessed by direct observation by this Nurse Health Advisor. No abnormalities found.        Immunizations Immunization History  Administered Date(s) Administered   PFIZER Comirnaty(Gray Top)Covid-19 Tri-Sucrose Vaccine 05/15/2021   PFIZER(Purple Top)SARS-COV-2 Vaccination 04/30/2020, 05/25/2020, 12/15/2020   PPD Test 05/13/2009, 05/27/2009, 05/06/2011   Pneumococcal Conjugate-13 01/27/2014    TDAP status: Due, Education has been provided regarding the importance of this vaccine. Advised may receive this vaccine at local pharmacy or Health Dept. Aware to provide a copy of the vaccination record if obtained from local pharmacy or Health Dept. Verbalized acceptance and understanding.  Flu Vaccine status: Declined, Education has been provided regarding the importance of this vaccine but patient still declined. Advised may receive this vaccine at local pharmacy or Health Dept. Aware to provide a copy of the vaccination record if obtained from local pharmacy or Health Dept. Verbalized acceptance and understanding.  Pneumococcal vaccine status: Up to date  Covid-19 vaccine status: Completed vaccines  Qualifies for Shingles Vaccine? Yes   Zostavax completed No    Shingrix Completed?: No.    Education has been provided regarding the importance of this vaccine. Patient has been advised to call insurance company to determine out of pocket expense if they have not yet received this vaccine. Advised may also receive vaccine at local pharmacy or Health Dept. Verbalized acceptance and understanding.  Screening Tests Health Maintenance  Topic Date Due   TETANUS/TDAP  Never done   Zoster Vaccines- Shingrix (1 of 2) Never done   INFLUENZA VACCINE  Never done   OPHTHALMOLOGY EXAM  09/04/2021   HEMOGLOBIN A1C  11/12/2021   FOOT EXAM  02/11/2022   MAMMOGRAM  05/25/2022   COLONOSCOPY (Pts 45-3yrs Insurance coverage will need to be confirmed)  03/09/2030   DEXA SCAN  Completed   COVID-19 Vaccine  Completed   Hepatitis C Screening  Completed   HPV VACCINES  Aged Out    Health Maintenance  Health Maintenance Due  Topic Date Due   TETANUS/TDAP  Never done   Zoster Vaccines- Shingrix (1 of 2) Never done   INFLUENZA VACCINE  Never done   OPHTHALMOLOGY EXAM  09/04/2021    Colorectal cancer screening: Type of screening: Colonoscopy. Completed 03/09/2020. Repeat every 10 years  Mammogram status: Completed 05/25/2021. Repeat every year  Bone Density status: Completed 04/27/2020. Results reflect: Bone density results: OSTEOPENIA. Repeat every 5 years.  Lung Cancer Screening: (Low Dose CT Chest recommended if Age 74-80 years, 30 pack-year currently smoking OR have quit w/in 15years.) does not qualify.   Lung Cancer Screening Referral: n/a  Additional Screening:  Hepatitis C Screening: does not qualify; Completed 10/18/2018  Vision Screening: Recommended annual ophthalmology exams for early detection of glaucoma and other disorders of the eye. Is the patient up to date with their annual eye exam?  Yes  Who is the provider or what is the name of the office in which the patient attends annual eye exams? Dr.Mincey  If pt is not established with a provider,  would they like to be referred to a provider to establish care? No .   Dental Screening: Recommended annual dental exams for proper oral hygiene  Community Resource Referral / Chronic Care Management: CRR required this visit?  No   CCM required this visit?  No      Plan:     I have personally reviewed and noted the following in the patient's chart:   Medical and social history Use of alcohol, tobacco or illicit drugs  Current medications and supplements including opioid prescriptions.  Functional ability and status Nutritional status Physical activity Advanced directives List of other physicians Hospitalizations, surgeries, and ER visits in previous 12 months Vitals Screenings to include cognitive, depression, and falls Referrals and appointments  In addition, I have reviewed and discussed with patient certain preventive protocols, quality metrics, and best practice recommendations. A written personalized care plan for preventive services as well as general preventive health recommendations were provided to patient.     Randel Pigg, LPN   04/04/9198   Nurse Notes: none

## 2021-09-09 NOTE — Patient Instructions (Signed)
[name] , Thank you for taking time to come for your Medicare Wellness Visit. I appreciate your ongoing commitment to your health goals. Please review the following plan we discussed and let me know if I can assist you in the future.   Screening recommendations/referrals: Colonoscopy: 03/09/2020  due 2031 Mammogram: 05/25/2021 Bone Density: 04/27/2020 Recommended yearly ophthalmology/optometry visit for glaucoma screening and checkup Recommended yearly dental visit for hygiene and checkup  Vaccinations: Influenza vaccine: declined  Pneumococcal vaccine: completed series  Tdap vaccine: due upon injury  Shingles vaccine: will consider     Advanced directives: none   Conditions/risks identified: none   Next appointment: none    Preventive Care 65 Years and Older, Female Preventive care refers to lifestyle choices and visits with your health care provider that can promote health and wellness. What does preventive care include? A yearly physical exam. This is also called an annual well check. Dental exams once or twice a year. Routine eye exams. Ask your health care provider how often you should have your eyes checked. Personal lifestyle choices, including: Daily care of your teeth and gums. Regular physical activity. Eating a healthy diet. Avoiding tobacco and drug use. Limiting alcohol use. Practicing safe sex. Taking low-dose aspirin every day. Taking vitamin and mineral supplements as recommended by your health care provider. What happens during an annual well check? The services and screenings done by your health care provider during your annual well check will depend on your age, overall health, lifestyle risk factors, and family history of disease. Counseling  Your health care provider may ask you questions about your: Alcohol use. Tobacco use. Drug use. Emotional well-being. Home and relationship well-being. Sexual activity. Eating habits. History of  falls. Memory and ability to understand (cognition). Work and work Statistician. Reproductive health. Screening  You may have the following tests or measurements: Height, weight, and BMI. Blood pressure. Lipid and cholesterol levels. These may be checked every 5 years, or more frequently if you are over 37 years old. Skin check. Lung cancer screening. You may have this screening every year starting at age 74 if you have a 30-pack-year history of smoking and currently smoke or have quit within the past 15 years. Fecal occult blood test (FOBT) of the stool. You may have this test every year starting at age 74 Flexible sigmoidoscopy or colonoscopy. You may have a sigmoidoscopy every 5 years or a colonoscopy every 10 years starting at age 74 Hepatitis C blood test. Hepatitis B blood test. Sexually transmitted disease (STD) testing. Diabetes screening. This is done by checking your blood sugar (glucose) after you have not eaten for a while (fasting). You may have this done every 1-3 years. Bone density scan. This is done to screen for osteoporosis. You may have this done starting at age 74 Mammogram. This may be done every 1-2 years. Talk to your health care provider about how often you should have regular mammograms. Talk with your health care provider about your test results, treatment options, and if necessary, the need for more tests. Vaccines  Your health care provider may recommend certain vaccines, such as: Influenza vaccine. This is recommended every year. Tetanus, diphtheria, and acellular pertussis (Tdap, Td) vaccine. You may need a Td booster every 10 years. Zoster vaccine. You may need this after age 74 Pneumococcal 13-valent conjugate (PCV13) vaccine. One dose is recommended after age 74 Pneumococcal polysaccharide (PPSV23) vaccine. One dose is recommended after age 74 Talk to your health care provider about which screenings  and vaccines you need and how often you need  them. This information is not intended to replace advice given to you by your health care provider. Make sure you discuss any questions you have with your health care provider. Document Released: 01/01/2016 Document Revised: 08/24/2016 Document Reviewed: 10/06/2015 Elsevier Interactive Patient Education  2017 [employer] Prevention in the Home Falls can cause injuries. They can happen to people of all ages. There are many things you can do to make your home safe and to help prevent falls. What can I do on the outside of my home? Regularly fix the edges of walkways and driveways and fix any cracks. Remove anything that might make you trip as you walk through a door, such as a raised step or threshold. Trim any bushes or trees on the path to your home. Use bright outdoor lighting. Clear any walking paths of anything that might make someone trip, such as rocks or tools. Regularly check to see if handrails are loose or broken. Make sure that both sides of any steps have handrails. Any raised decks and porches should have guardrails on the edges. Have any leaves, snow, or ice cleared regularly. Use sand or salt on walking paths during winter. Clean up any spills in your garage right away. This includes oil or grease spills. What can I do in the bathroom? Use night lights. Install grab bars by the toilet and in the tub and shower. Do not use towel bars as grab bars. Use non-skid mats or decals in the tub or shower. If you need to sit down in the shower, use a plastic, non-slip stool. Keep the floor dry. Clean up any water that spills on the floor as soon as it happens. Remove soap buildup in the tub or shower regularly. Attach bath mats securely with double-sided non-slip rug tape. Do not have throw rugs and other things on the floor that can make you trip. What can I do in the bedroom? Use night lights. Make sure that you have a light by your bed that is easy to reach. Do not use  any sheets or blankets that are too big for your bed. They should not hang down onto the floor. Have a firm chair that has side arms. You can use this for support while you get dressed. Do not have throw rugs and other things on the floor that can make you trip. What can I do in the kitchen? Clean up any spills right away. Avoid walking on wet floors. Keep items that you use a lot in easy-to-reach places. If you need to reach something above you, use a strong step stool that has a grab bar. Keep electrical cords out of the way. Do not use floor polish or wax that makes floors slippery. If you must use wax, use non-skid floor wax. Do not have throw rugs and other things on the floor that can make you trip. What can I do with my stairs? Do not leave any items on the stairs. Make sure that there are handrails on both sides of the stairs and use them. Fix handrails that are broken or loose. Make sure that handrails are as long as the stairways. Check any carpeting to make sure that it is firmly attached to the stairs. Fix any carpet that is loose or worn. Avoid having throw rugs at the top or bottom of the stairs. If you do have throw rugs, attach them to the floor with carpet tape.  Make sure that you have a light switch at the top of the stairs and the bottom of the stairs. If you do not have them, ask someone to add them for you. What else can I do to help prevent falls? Wear shoes that: Do not have high heels. Have rubber bottoms. Are comfortable and fit you well. Are closed at the toe. Do not wear sandals. If you use a stepladder: Make sure that it is fully opened. Do not climb a closed stepladder. Make sure that both sides of the stepladder are locked into place. Ask someone to hold it for you, if possible. Clearly mark and make sure that you can see: Any grab bars or handrails. First and last steps. Where the edge of each step is. Use tools that help you move around (mobility aids)  if they are needed. These include: Canes. Walkers. Scooters. Crutches. Turn on the lights when you go into a dark area. Replace any light bulbs as soon as they burn out. Set up your furniture so you have a clear path. Avoid moving your furniture around. If any of your floors are uneven, fix them. If there are any pets around you, be aware of where they are. Review your medicines with your doctor. Some medicines can make you feel dizzy. This can increase your chance of falling. Ask your doctor what other things that you can do to help prevent falls. This information is not intended to replace advice given to you by your health care provider. Make sure you discuss any questions you have with your health care provider. Document Released: 10/01/2009 Document Revised: 05/12/2016 Document Reviewed: 01/09/2015 Elsevier Interactive Patient Education  2017 [employer].

## 2021-09-17 ENCOUNTER — Other Ambulatory Visit: Payer: Self-pay

## 2021-09-17 ENCOUNTER — Encounter: Payer: Self-pay | Admitting: Adult Health

## 2021-09-17 ENCOUNTER — Ambulatory Visit (INDEPENDENT_AMBULATORY_CARE_PROVIDER_SITE_OTHER): Payer: Medicare Other | Admitting: Allergy

## 2021-09-17 ENCOUNTER — Encounter: Payer: Self-pay | Admitting: Allergy

## 2021-09-17 VITALS — BP 116/72 | HR 87 | Temp 97.8°F | Resp 16 | Ht 64.0 in | Wt 131.2 lb

## 2021-09-17 DIAGNOSIS — K219 Gastro-esophageal reflux disease without esophagitis: Secondary | ICD-10-CM

## 2021-09-17 DIAGNOSIS — J3089 Other allergic rhinitis: Secondary | ICD-10-CM

## 2021-09-17 DIAGNOSIS — T7800XD Anaphylactic reaction due to unspecified food, subsequent encounter: Secondary | ICD-10-CM

## 2021-09-17 DIAGNOSIS — J3489 Other specified disorders of nose and nasal sinuses: Secondary | ICD-10-CM

## 2021-09-17 DIAGNOSIS — J4540 Moderate persistent asthma, uncomplicated: Secondary | ICD-10-CM

## 2021-09-17 NOTE — Assessment & Plan Note (Signed)
Stable.   Continue taking omeprazole 40mg  daily as needed.

## 2021-09-17 NOTE — Assessment & Plan Note (Signed)
Past history - Felt better with [facility] but could not afford to purchase after samples ran out. Reports that inhalers cause her mouth to get sores and become white Interim history - stable with no inhaler use.   Today's spirometry was normal.   Daily controller medication(s):  Continue Singulair 10mg  daily.   May use albuterol rescue inhaler 2 puffs or nebulizer every 4 to 6 hours as needed for shortness of breath, chest tightness, coughing, and wheezing. May use albuterol rescue inhaler 2 puffs 5 to 15 minutes prior to strenuous physical activities. Monitor frequency of use.

## 2021-09-17 NOTE — Progress Notes (Signed)
 Follow Up Note  RE: Kimberly Lam MRN: [id number] DOB: 08/24/1946 Date of Office Visit: 09/17/2021  Referring provider: Nafziger, Cory, NP Primary care provider: Nafziger, Cory, NP  Chief Complaint: Follow-up  History of Present Illness: I had the pleasure of seeing Kimberly Lam for a follow up visit at the [facility] on 09/17/2021. She is a 75 y.o. female, who is being followed for asthma, allergic rhino conjunctivitis on AIT, nasal septal perforation, food allergy and GERD. Her previous allergy office visit was on 03/08/2021 with Dr. Kim. Today is a regular follow up visit.  Moderate persistent asthma Taking Singulair 10mg at night.  Denies any SOB, wheezing, chest tightness, nocturnal awakenings, ER/urgent care visits or prednisone use since the last visit.   Perennial and seasonal allergic rhinoconjunctivitis Patient is thinking of relocating to [place] or [place]. She doesn't know yet as she is planning on taking vacation and looking at those areas.  She is doing well with the allergy injections and sometimes has headaches when she is due or late for her injections.  Currently taking OTC antihistamines with good benefit.  Takes Flonase and Atrovent nasal sprays BID with good benefit. No nosebleeds.   Patient wants to know if a family member can give her a shot at home - Discussed with patient that she can not get the injections at home.    Nasal septal perforation Saw ENT - monitoring symptoms.  It is bothersome at times especially in the evenings when she can hear some whistling through her nose.    Anaphylactic shock due to adverse food reaction Currently avoiding peanuts, cow's milk, soybean. No reactions.  Gastroesophageal reflux disease Stable with omeprazole 40mg daily as needed  Assessment and Plan: Kimberly Lam is a 75 y.o. female with: Moderate persistent asthma, uncomplicated Past history - Felt better with Dulera but could not afford to purchase after  samples ran out. Reports that inhalers cause her mouth to get sores and become white Interim history - stable with no inhaler use.  Today's spirometry was normal.  Daily controller medication(s):  Continue Singulair 10mg daily.  May use albuterol rescue inhaler 2 puffs or nebulizer every 4 to 6 hours as needed for shortness of breath, chest tightness, coughing, and wheezing. May use albuterol rescue inhaler 2 puffs 5 to 15 minutes prior to strenuous physical activities. Monitor frequency of use.   Perennial and seasonal allergic rhinoconjunctivitis Past history - started AIT on 10/27/2015 (TREE/GRASS-WEED-MITE-CAT-DOG/MOLD-CR).  Interim history - Unable to go more than 2 weeks without injections. Planning on moving out of state. Continue environmental control measures. Continue allergy injections every 2 weeks.  Once you have a new allergist - let us know and will fax over records and instructions regarding the allergy shots. Discussed with patient that she can NOT inject allergy shots at home even though there's a family member in the medical field giving it.  May use over the counter antihistamines such as Zyrtec (cetirizine), Claritin (loratadine), Allegra (fexofenadine) as needed in the morning.  USE saline lavage before use the nasal sprays in the morning and at night. Use Flonase (fluticasone) nasal spray 1 spray per nostril twice a day as needed for nasal congestion.  May use Atrovent 2 sprays in each nostril 3 times a day as needed for drainage.  Continue montelukast 10mg daily.   Nasal septal perforation Past history - Saw ENT in the past (no intervention). Interim history - unchanged. Monitor symptoms.   Follow up with ENT   as needed.   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 Interim history - No reactions. Continue to avoid peanuts, cow's milk, soybean. For mild symptoms you can take over the counter antihistamines  such as Benadryl and monitor symptoms closely. If symptoms worsen or if you have severe symptoms including breathing issues, throat closure, significant swelling, whole body hives, severe diarrhea and vomiting, lightheadedness then inject epinephrine and seek immediate medical care afterwards.  Gastroesophageal reflux disease Stable.  Continue taking omeprazole 40mg daily as needed.  Return in about 6 months (around 03/17/2022), or if symptoms worsen or fail to improve.  No orders of the defined types were placed in this encounter.  Lab Orders  No laboratory test(s) ordered today    Diagnostics: Spirometry:  Tracings reviewed. Her effort: Good reproducible efforts. FVC: 1.91L FEV1: 1.63L, 94% predicted FEV1/FVC ratio: 85% Interpretation: Spirometry consistent with normal pattern.  Please see scanned spirometry results for details.  Medication List:  Current Outpatient Medications  Medication Sig Dispense Refill   Alcohol Swabs (B-D SINGLE USE SWABS REGULAR) PADS USE TO TEST BLOOD GLUCOSE FOUR TIMES DAILY 200 each 3   alendronate (FOSAMAX) 70 MG tablet TAKE 1 TABLET BY MOUTH  WEEKLY WITH 8 OZ OF PLAIN  WATER 30 MINUTES BEFORE  FIRST FOOD, DRINK OR MEDS.  STAY UPRIGHT FOR 30 MINS 12 tablet 3   Ascorbic Acid (VITAMIN C) 1000 MG tablet Take 1,000 mg by mouth as needed.     Azelastine HCl 0.15 % SOLN Use 1-2 sprays per nostril twice a day as needed for sinus drainage. 30 mL 5   Barberry-Oreg Grape-Goldenseal (BERBERINE COMPLEX PO) Take by mouth daily.     benzonatate (TESSALON PERLES) 100 MG capsule Take 1 capsule (100 mg total) by mouth 3 (three) times daily as needed. 20 capsule 0   Blood Glucose Monitoring Suppl (ONETOUCH VERIO FLEX SYSTEM) w/Device KIT USE AS DIRECTED 1 kit 0   Calcium Carbonate-Vitamin D (CALCIUM 600+D PO) Take by mouth.     CINNAMON PO Take 1 tablet by mouth as needed.     Cranberry 450 MG CAPS Take 1 capsule by mouth as needed.     Cyanocobalamin (VITAMIN B-12  CR) 1500 MCG TBCR Take 1 tablet by mouth as needed.     Echinacea-Goldenseal (ECHINACEA COMB/GOLDEN SEAL PO) Take 1 tablet by mouth as needed. Reported on 12/16/2015     EPINEPHrine 0.3 mg/0.3 mL IJ SOAJ injection Inject 0.3 mg into the muscle as needed for anaphylaxis.     fluticasone (FLONASE) 50 MCG/ACT nasal spray USE 1 SPRAY IN BOTH  NOSTRILS TWICE DAILY 48 g 2   gabapentin (NEURONTIN) 300 MG capsule TAKE 2 CAPSULES BY MOUTH 3  TIMES DAILY 540 capsule 3   Green Tea, Camillia sinensis, (GREEN TEA PO) Take by mouth as needed. Reported on 12/16/2015     ipratropium (ATROVENT) 0.06 % nasal spray INSTILL 2 SPRAYS IN BOTH  NOSTRILS 3 TIMES DAILY AS  NEEDED FOR DRAINAGE 75 mL 2   Lactobacillus (ACIDOPHILUS PROBIOTIC PO) Take 1 tablet by mouth daily.     Lancet Devices (ONETOUCH DELICA PLUS LANCING) MISC USE AS DIRECTED 1 each 1   Lancets (ONETOUCH DELICA PLUS LANCET30G) MISC TEST IN THE MORNING , AT  NOON, IN THE EVENING AND AT BEDTIME 400 each 3   levocetirizine (XYZAL) 5 MG tablet TAKE 1 TABLET BY MOUTH IN  THE EVENING 30 tablet 0   MELATONIN ER PO Take   by mouth.     metFORMIN (GLUCOPHAGE-XR) 500 MG 24 hr tablet TAKE 2 TABLETS BY MOUTH  TWICE DAILY 360 tablet 3   montelukast (SINGULAIR) 10 MG tablet TAKE 1 TABLET BY MOUTH AT  BEDTIME 30 tablet 0   Omega-3 Fatty Acids (OMEGA 3 PO) Take 1 tablet by mouth as needed.     omeprazole (PRILOSEC) 40 MG capsule Take 40 mg by mouth daily.     ONETOUCH VERIO test strip USE TO TEST BLOOD GLUCOSE 4 TIMES DAILY 400 strip 3   Polyethyl Glycol-Propyl Glycol (SYSTANE FREE OP) Apply 1 drop to eye daily. Uses thera work for dr eyes per pt     POTASSIUM PO Take by mouth.     ramipril (ALTACE) 2.5 MG capsule TAKE 1 CAPSULE BY MOUTH  DAILY 90 capsule 3   simvastatin (ZOCOR) 20 MG tablet TAKE 1 TABLET BY MOUTH  DAILY 90 tablet 3   Spacer/Aero-Holding Chambers (AEROCHAMBER PLUS) inhaler Use as instructed with MDI 1 each 2   Stinging Nettle 2 % POWD Take 1 tablet by  mouth as needed. Reported on 12/16/2015     traZODone (DESYREL) 50 MG tablet TAKE 1/2 TO 1 TABLET BY  MOUTH AT BEDTIME AS NEEDED  FOR SLEEP 90 tablet 3   TURMERIC PO Take 800 mg by mouth as needed. Reported on 12/16/2015     VENTOLIN HFA 108 (90 Base) MCG/ACT inhaler Inhale 2 puffs into the lungs every 6 (six) hours as needed for wheezing or shortness of breath.     No current facility-administered medications for this visit.   Allergies: Allergies  Allergen Reactions   Peanut-Containing Drug Products Anaphylaxis   Eggs Or Egg-Derived Products Nausea Only    Nausea only- can eat cakes with eggs    Milk-Related Compounds Nausea And Vomiting    Milk and cheese   Actos [Pioglitazone] Nausea Only   Soy Allergy Nausea Only    Pt states had allergy testing and was told allergic to soy-causes nausea but no other reactions   Vicodin [Hydrocodone-Acetaminophen] Nausea Only   I reviewed her past medical history, social history, family history, and environmental history and no significant changes have been reported from her previous visit.  Review of Systems  Constitutional:  Negative for appetite change, chills, fever and unexpected weight change.  HENT:  Positive for congestion. Negative for postnasal drip and rhinorrhea.   Eyes:  Negative for itching.  Respiratory:  Negative for cough, chest tightness, shortness of breath and wheezing.   Cardiovascular:  Negative for chest pain.  Gastrointestinal:  Negative for abdominal pain.  Genitourinary:  Negative for difficulty urinating and dysuria.  Skin:  Negative for rash.  Allergic/Immunologic: Positive for environmental allergies and food allergies.  Neurological:  Negative for headaches.   Objective: BP 116/72   Pulse 87   Temp 97.8 F (36.6 C) (Temporal)   Resp 16   Ht 5' 4" (1.626 m)   Wt 131 lb 4 oz (59.5 kg)   SpO2 97%   BMI 22.53 kg/m  Body mass index is 22.53 kg/m. Physical Exam Vitals and nursing note reviewed.   Constitutional:      Appearance: Normal appearance. She is well-developed.  HENT:     Head: Normocephalic and atraumatic.     Right Ear: External ear normal.     Left Ear: External ear normal.     Nose:     Comments: Septal perforation present    Mouth/Throat:     Mouth: Mucous membranes   are moist.     Pharynx: Oropharynx is clear.  Eyes:     Conjunctiva/sclera: Conjunctivae normal.  Cardiovascular:     Rate and Rhythm: Normal rate and regular rhythm.     Heart sounds: Normal heart sounds. No murmur heard. Pulmonary:     Effort: Pulmonary effort is normal.     Breath sounds: Normal breath sounds. No wheezing, rhonchi or rales.  Musculoskeletal:     Cervical back: Neck supple.  Skin:    General: Skin is warm.     Findings: No rash.  Neurological:     Mental Status: She is alert and oriented to person, place, and time.  Psychiatric:        Behavior: Behavior normal.   Previous notes and tests were reviewed. The plan was reviewed with the patient/family, and all questions/concerned were addressed.  It was my pleasure to see Kimberly Lam today and participate in her care. Please feel free to contact me with any questions or concerns.  Sincerely,  Yoon Kim, DO Allergy & Immunology  [facility] [place] office: [phone] [place] office: [phone]  

## 2021-09-17 NOTE — Assessment & Plan Note (Signed)
Past history - started AIT on 10/27/2015 (TREE/GRASS-WEED-MITE-CAT-DOG/MOLD-CR).  Interim history - Unable to go more than 2 weeks without injections. Planning on moving out of state.  Continue environmental control measures.  Continue allergy injections every 2 weeks.   Once you have a new allergist - let us know and will fax over records and instructions regarding the allergy shots.  Discussed with patient that she can NOT inject allergy shots at home even though there's a family member in the medical field giving it.   May use over the counter antihistamines such as Zyrtec (cetirizine), Claritin (loratadine), Allegra (fexofenadine) as needed in the morning.   USE saline lavage before use the nasal sprays in the morning and at night.  Use Flonase (fluticasone) nasal spray 1 spray per nostril twice a day as needed for nasal congestion.   May use Atrovent 2 sprays in each nostril 3 times a day as needed for drainage.   Continue montelukast 10mg  daily.

## 2021-09-17 NOTE — Patient Instructions (Addendum)
Perennial and seasonal allergic rhinoconjunctivitis Continue environmental control measures. Continue allergy injections every 2 weeks.  Once you have a new allergist - let us know and will fax over records and instructions regarding the allergy shots. You can not give yourself the shots at home. May use over the counter antihistamines such as Zyrtec (cetirizine), Claritin (loratadine), Allegra (fexofenadine) as needed in the morning.   USE saline lavage before use the nasal sprays in the morning and at night. Use Flonase (fluticasone) nasal spray 1 spray per nostril twice a day as needed for nasal congestion.  May use Atrovent 2 sprays in each nostril 3 times a day as needed for drainage.  Continue montelukast 10mg  daily.    Asthma:  Daily controller medication(s):  Continue Singulair 10mg  daily.  May use albuterol rescue inhaler 2 puffs or nebulizer every 4 to 6 hours as needed for shortness of breath, chest tightness, coughing, and wheezing. May use albuterol rescue inhaler 2 puffs 5 to 15 minutes prior to strenuous physical activities. Monitor frequency of use.    Nasal septal perforation Monitor symptoms.   Follow up with ENT as needed.    Gastroesophageal reflux disease Continue taking omeprazole 40mg  daily as needed.   Anaphylactic shock due to adverse food reaction Continue to avoid peanuts, cow's milk, soybean. For mild symptoms you can take over the counter antihistamines such as Benadryl and monitor symptoms closely. If symptoms worsen or if you have severe symptoms including breathing issues, throat closure, significant swelling, whole body hives, severe diarrhea and vomiting, lightheadedness then inject epinephrine and seek immediate medical care afterwards.  Follow up in 6 months or sooner if needed or follow up with your new allergist if you move.

## 2021-09-17 NOTE — Assessment & Plan Note (Addendum)
Past history - soy caused increased rhinitis symptoms. Interim history - No reactions.  Continue to avoid peanuts, cow's milk, soybean.  For mild symptoms you can take over the counter antihistamines such as Benadryl and monitor symptoms closely. If symptoms worsen or if you have severe symptoms including breathing issues, throat closure, significant swelling, whole body hives, severe diarrhea and vomiting, lightheadedness then inject epinephrine and seek immediate medical care afterwards. 

## 2021-09-17 NOTE — Assessment & Plan Note (Signed)
Past history - Saw ENT in the past (no intervention). Interim history - unchanged.  Monitor symptoms.    Follow up with ENT as needed.

## 2021-09-20 ENCOUNTER — Other Ambulatory Visit: Payer: Self-pay | Admitting: *Deleted

## 2021-09-21 ENCOUNTER — Ambulatory Visit (INDEPENDENT_AMBULATORY_CARE_PROVIDER_SITE_OTHER): Payer: Medicare Other | Admitting: *Deleted

## 2021-09-21 DIAGNOSIS — J309 Allergic rhinitis, unspecified: Secondary | ICD-10-CM | POA: Diagnosis not present

## 2021-09-22 NOTE — Patient Outreach (Signed)
[facility]) Care Management  Page  09/22/2021   Kimberly Lam 1946-05-28 [id number]  [place] receive return telephone call from patient.  [employer] compliance verified. Per patient her fasting blood sugar is 122. Her A1C is 7.0. Patient has an exercise routine of walking. Appetite is good. Patient has an eye exam on [id number]. Patient has agreed to follow up outreach calls.     Encounter Medications:  Outpatient Encounter Medications as of 09/20/2021  Medication Sig Note   Alcohol Swabs (B-D SINGLE USE SWABS REGULAR) PADS USE TO TEST BLOOD GLUCOSE FOUR TIMES DAILY    alendronate (FOSAMAX) 70 MG tablet TAKE 1 TABLET BY MOUTH  WEEKLY WITH 8 OZ OF PLAIN  WATER 30 MINUTES BEFORE  FIRST FOOD, DRINK OR MEDS.  STAY UPRIGHT FOR 30 MINS    Ascorbic Acid (VITAMIN C) 1000 MG tablet Take 1,000 mg by mouth as needed. 04/11/2019: Patient states that she takes it daily    Azelastine HCl 0.15 % SOLN Use 1-2 sprays per nostril twice a day as needed for sinus drainage.    Barberry-Oreg Grape-Goldenseal (BERBERINE COMPLEX PO) Take by mouth daily.    benzonatate (TESSALON PERLES) 100 MG capsule Take 1 capsule (100 mg total) by mouth 3 (three) times daily as needed.    Blood Glucose Monitoring Suppl (ONETOUCH VERIO FLEX SYSTEM) w/Device KIT USE AS DIRECTED    Calcium Carbonate-Vitamin D (CALCIUM 600+D PO) Take by mouth.    CINNAMON PO Take 1 tablet by mouth as needed.    Cranberry 450 MG CAPS Take 1 capsule by mouth as needed. 04/11/2019: Patient takes it daily    Cyanocobalamin (VITAMIN B-12 CR) 1500 MCG TBCR Take 1 tablet by mouth as needed. 04/11/2019: Patient takes once daily    Echinacea-Goldenseal (ECHINACEA COMB/GOLDEN SEAL PO) Take 1 tablet by mouth as needed. Reported on 12/16/2015 03/14/2019: PRN    EPINEPHrine 0.3 mg/0.3 mL IJ SOAJ injection Inject 0.3 mg into the muscle as needed for anaphylaxis.    fluticasone (FLONASE) 50 MCG/ACT nasal spray USE 1 SPRAY IN  BOTH  NOSTRILS TWICE DAILY    gabapentin (NEURONTIN) 300 MG capsule TAKE 2 CAPSULES BY MOUTH 3  TIMES DAILY    Green Tea, Camillia sinensis, (GREEN TEA PO) Take by mouth as needed. Reported on 12/16/2015 03/14/2019: PRN   ipratropium (ATROVENT) 0.06 % nasal spray INSTILL 2 SPRAYS IN BOTH  NOSTRILS 3 TIMES DAILY AS  NEEDED FOR DRAINAGE    Lactobacillus (ACIDOPHILUS PROBIOTIC PO) Take 1 tablet by mouth daily.    Lancet Devices (ONETOUCH DELICA PLUS LANCING) MISC USE AS DIRECTED    Lancets (ONETOUCH DELICA PLUS [id number]) MISC TEST IN THE MORNING , AT  NOON, IN THE EVENING AND AT BEDTIME    levocetirizine (XYZAL) 5 MG tablet TAKE 1 TABLET BY MOUTH IN  THE EVENING    MELATONIN ER PO Take by mouth.    metFORMIN (GLUCOPHAGE-XR) 500 MG 24 hr tablet TAKE 2 TABLETS BY MOUTH  TWICE DAILY    montelukast (SINGULAIR) 10 MG tablet TAKE 1 TABLET BY MOUTH AT  BEDTIME    Omega-3 Fatty Acids (OMEGA 3 PO) Take 1 tablet by mouth as needed.    omeprazole (PRILOSEC) 40 MG capsule Take 40 mg by mouth daily.    ONETOUCH VERIO test strip USE TO TEST BLOOD GLUCOSE 4 TIMES DAILY    Polyethyl Glycol-Propyl Glycol (SYSTANE FREE OP) Apply 1 drop to eye daily. Uses thera work for dr eyes per pt  POTASSIUM PO Take by mouth.    ramipril (ALTACE) 2.5 MG capsule TAKE 1 CAPSULE BY MOUTH  DAILY    simvastatin (ZOCOR) 20 MG tablet TAKE 1 TABLET BY MOUTH  DAILY    Spacer/Aero-Holding Chambers (AEROCHAMBER PLUS) inhaler Use as instructed with MDI    Stinging Nettle 2 % POWD Take 1 tablet by mouth as needed. Reported on 12/16/2015    traZODone (DESYREL) 50 MG tablet TAKE 1/2 TO 1 TABLET BY  MOUTH AT BEDTIME AS NEEDED  FOR SLEEP    TURMERIC PO Take 800 mg by mouth as needed. Reported on 12/16/2015 04/11/2019: Patient takes dialy   VENTOLIN HFA 108 (90 Base) MCG/ACT inhaler Inhale 2 puffs into the lungs every 6 (six) hours as needed for wheezing or shortness of breath.    No facility-administered encounter medications on file as of  09/20/2021.    Functional Status:  In your present state of health, do you have any difficulty performing the following activities: 09/09/2021  Hearing? N  Vision? N  Difficulty concentrating or making decisions? N  Walking or climbing stairs? N  Dressing or bathing? N  Doing errands, shopping? N  Preparing Food and eating ? N  Using the Toilet? N  In the past six months, have you accidently leaked urine? N  Do you have problems with loss of bowel control? N  Managing your Medications? N  Managing your Finances? N  Housekeeping or managing your Housekeeping? N  Some recent data might be hidden    Fall/Depression Screening: Fall Risk  09/20/2021 09/09/2021 03/22/2021  Falls in the past year? 0 0 0  Number falls in past yr: 0 0 0  Injury with Fall? 0 0 0  Risk for fall due to : No Fall Risks No Fall Risks -  Follow up Falls evaluation completed Falls evaluation completed Falls evaluation completed   PHQ 2/9 Scores 09/09/2021 09/09/2021 02/11/2021 09/03/2020 01/14/2020 04/11/2019 03/12/2019  PHQ - 2 Score 0 0 0 0 0 0 0  PHQ- 9 Score - - - 0 - - -    Assessment:   Care Plan Care Plan : Diabetes Type 2 (Adult)  Updates made by [name], RN since 09/22/2021 12:00 AM     Problem: Glycemic Management (Diabetes, Type 2)   Priority: Medium  Onset Date: 10/12/2020     Goal: Glycemic Management Optimized   Start Date: 10/12/2020  Expected End Date: 04/17/2022  This Visit's Progress: On track  Recent Progress: On track  Priority: High  Note:   Evidence-based guidance:  Anticipate A1C testing (point-of-care) every 3 to 6 months based on goal attainment.  Review mutually-set A1C goal or target range.  Anticipate use of antihyperglycemic with or without insulin and periodic adjustments; consider active involvement of pharmacist.  Provide medical nutrition therapy and development of individualized eating.  Compare self-reported symptoms of hypo or hyperglycemia to blood glucose  levels, diet and fluid intake, current medications, psychosocial and physiologic stressors, change in activity and barriers to care adherence.  Promote self-monitoring of blood glucose levels.  Assess and address barriers to management plan, such as food insecurity, age, developmental ability, depression, anxiety, fear of hypoglycemia or weight gain, as well as medication cost, side effects and complicated regimen.  Consider referral to community-based diabetes education program, visiting nurse, community health worker or health coach.  Encourage regular dental care for treatment of periodontal disease; refer to dental provider when needed.   Notes:  [id number] Patient A1C decreased from 7.2  to 7 .    Task: Alleviate Barriers to Glycemic Management   Due Date: 04/17/2022  Note:   Timeframe:  Short-Term Goal Priority:  High Start Date:  [id number]                           Expected End Date:   [id number]                   Care Management Activities:    - A1C testing facilitated - blood glucose monitoring encouraged - mutual A1C goal set or reviewed - resources required to improve adherence to care identified - self-awareness of signs/symptoms of hypo or hyperglycemia encouraged - use of blood glucose monitoring log promoted    Notes:     Problem: Disease Progression (Diabetes, Type 2)   Priority: Medium  Onset Date: 10/12/2021     Long-Range Goal: Disease Progression Prevented or Minimized   Start Date: 10/12/2020  Expected End Date: 04/17/2022  This Visit's Progress: On track  Recent Progress: On track  Priority: Medium  Note:   Evidence-based guidance:  Prepare patient for laboratory and diagnostic exams based on risk and presentation.  Encourage lifestyle changes, such as increased intake of plant-based foods, stress reduction, consistent physical activity and smoking cessation to prevent long-term complications and chronic disease.   Individualize activity and exercise  recommendations while considering potential limitations, such as neuropathy, retinopathy or the ability to prevent hyperglycemia or hypoglycemia.   Prepare patient for use of pharmacologic therapy that may include antihypertensive, analgesic, prostaglandin E1 with periodic adjustments, based on presenting chronic condition and laboratory results.  Assess signs/symptoms and risk factors for hypertension, sleep-disordered breathing, neuropathy (including changes in gait and balance), retinopathy, nephropathy and sexual dysfunction.  Address pregnancy planning and contraceptive choice, especially when prescribing antihypertensive or statin.  Ensure completion of annual comprehensive foot exam and dilated eye exam.   Implement additional individualized goals and interventions based on identified risk factors.  Prepare patient for consultation or referral for specialist care, such as ophthalmology, neurology, cardiology, podiatry, nephrology or perinatology.   Notes:  [id number] Patient is following up with health maintenance    Task: Monitor and Manage Follow-up for Comorbidities   Due Date: 04/17/2022  Note:   Timeframe:  Long-Range Goal Priority:  Medium Start Date: [id number]                            Expected End Date:    48250037                  Care Management Activities:    - completion of annual dilated eye exam confirmed - completion of annual foot exam verified - healthy lifestyle promoted - reduction of sedentary activity encouraged - signs/symptoms of comorbidities identified    Notes:  [id number] last eye exam [id number]      Goals Addressed             This Visit's Progress    (THN)(Obtain Eye Exam   On track    Timeframe:  Long-Range Goal Priority:  Medium Start Date:    28003491                         Expected End Date:   79150569         Follow Up Date 79480165   - schedule appointment with eye doctor  Why is this important?   Eye check-ups are important when  you have diabetes.  Vision loss can be prevented.    Notes:  Last eye exam was [id number] last eye exam 14782956     (THN)Monitor and Manage My Blood Sugar   On track    Timeframe:  Long-Range Goal Priority:  High Start Date:   21308657                          Expected End Date:  84696295        Follow Up Date 28413244   - check blood sugar at prescribed times - check blood sugar if I feel it is too high or too low - take the blood sugar log to all doctor visits - take the blood sugar meter to all doctor visits    Why is this important?   Checking your blood sugar at home helps to keep it from getting very high or very low.  Writing the results in a diary or log helps the doctor know how to care for you.  Your blood sugar log should have the time, date and the results.  Also, write down the amount of insulin or other medicine that you take.  Other information, like what you ate, exercise done and how you were feeling, will also be helpful.     Notes:  Managing her blood sugars and knowing what and how it affects her is what she is learning [id number] Patient fasting blood sugar 122/ A1C 7.0     (THN)Perform Foot Care   On track    Timeframe:  Long-Range Goal Priority:  Medium Start Date:   66440347                          Expected End Date: [id number]              Follow Up Date 75643329  - check feet daily for cuts, sores or redness - trim toenails straight across - wash and dry feet carefully every day - wear comfortable, cotton socks - wear comfortable, well-fitting shoes    Why is this important?   Good foot care is very important when you have diabetes.  There are many things you can do to keep your feet healthy and catch a problem early.    Notes: Foot care is on going process for diabetics  [id number] Patient is checking feet daily     (THN)Set My Target A1C   On track    Timeframe:  Short-Term Goal Priority:  High Start Date:  06301601                            Expected End Date:  09323557 Follow Up Date 32202542 - set target A1C    Why is this important?   Your target A1C is decided together by you and your doctor.  It is based on several things like your age and other health issues.    Notes:  A1C target set at 6.7. Patient has reached now trying to maintain Patient has exceeded her goal by A1C is now 6.6. Monitoring patient for maintaining [id number] Patient A1C has increased from 06.6 to 7.2 [id number] A1C 7.0        Plan:  Follow-up: Follow-up in 6 month(s) RN sent How to Thrive: A Guide for your Journey with Diabetes RN  gave patient information on [facility] hearing aids  Patient will follow up on COVID booster RN sent update assessment to PCP   [place] [facility] [phone]

## 2021-09-22 NOTE — Patient Instructions (Signed)
Goals Addressed             This Visit's Progress    (THN)(Obtain Eye Exam   On track    Timeframe:  Long-Range Goal Priority:  Medium Start Date:    26712458                         Expected End Date:   09983382         Follow Up Date 50539767   - schedule appointment with eye doctor    Why is this important?   Eye check-ups are important when you have diabetes.  Vision loss can be prevented.    Notes:  Last eye exam was 34193790 [id number] last eye exam 29924268     (THN)Monitor and Manage My Blood Sugar   On track    Timeframe:  Long-Range Goal Priority:  High Start Date:   34196222                          Expected End Date:  97989211        Follow Up Date 94174081   - check blood sugar at prescribed times - check blood sugar if I feel it is too high or too low - take the blood sugar log to all doctor visits - take the blood sugar meter to all doctor visits    Why is this important?   Checking your blood sugar at home helps to keep it from getting very high or very low.  Writing the results in a diary or log helps the doctor know how to care for you.  Your blood sugar log should have the time, date and the results.  Also, write down the amount of insulin or other medicine that you take.  Other information, like what you ate, exercise done and how you were feeling, will also be helpful.     Notes:  Managing her blood sugars and knowing what and how it affects her is what she is learning [id number] Patient fasting blood sugar 122/ A1C 7.0     (THN)Perform Foot Care   On track    Timeframe:  Long-Range Goal Priority:  Medium Start Date:   14970263                          Expected End Date: 78588502              Follow Up Date 77412878  - check feet daily for cuts, sores or redness - trim toenails straight across - wash and dry feet carefully every day - wear comfortable, cotton socks - wear comfortable, well-fitting shoes    Why is this important?   Good  foot care is very important when you have diabetes.  There are many things you can do to keep your feet healthy and catch a problem early.    Notes: Foot care is on going process for diabetics  [id number] Patient is checking feet daily     (THN)Set My Target A1C   On track    Timeframe:  Short-Term Goal Priority:  High Start Date:  70962836                           Expected End Date:  62947654 Follow Up Date 65035465 - set target A1C    Why is this important?  Your target A1C is decided together by you and your doctor.  It is based on several things like your age and other health issues.    Notes:  A1C target set at 6.7. Patient has reached now trying to maintain Patient has exceeded her goal by A1C is now 6.6. Monitoring patient for maintaining [id number] Patient A1C has increased from 06.6 to 7.2 [id number] A1C 7.0

## 2021-09-22 NOTE — Telephone Encounter (Signed)
Please advise 

## 2021-10-07 ENCOUNTER — Ambulatory Visit (INDEPENDENT_AMBULATORY_CARE_PROVIDER_SITE_OTHER): Payer: Medicare Other | Admitting: *Deleted

## 2021-10-07 DIAGNOSIS — J309 Allergic rhinitis, unspecified: Secondary | ICD-10-CM

## 2021-10-08 ENCOUNTER — Telehealth: Payer: Self-pay

## 2021-10-08 NOTE — Telephone Encounter (Signed)
Report states pt last refilled medication on 04/17/21. Pt states she has recently refilled medication & does not have any needs at this time.

## 2021-10-15 ENCOUNTER — Other Ambulatory Visit: Payer: Self-pay | Admitting: Allergy

## 2021-10-15 DIAGNOSIS — J309 Allergic rhinitis, unspecified: Secondary | ICD-10-CM

## 2021-10-19 ENCOUNTER — Ambulatory Visit (INDEPENDENT_AMBULATORY_CARE_PROVIDER_SITE_OTHER): Payer: Medicare Other | Admitting: *Deleted

## 2021-10-19 DIAGNOSIS — J309 Allergic rhinitis, unspecified: Secondary | ICD-10-CM

## 2021-10-20 DIAGNOSIS — J3089 Other allergic rhinitis: Secondary | ICD-10-CM | POA: Diagnosis not present

## 2021-10-20 NOTE — Progress Notes (Signed)
VIALS MADE. EXP 10-20-22 

## 2021-10-21 DIAGNOSIS — J302 Other seasonal allergic rhinitis: Secondary | ICD-10-CM | POA: Diagnosis not present

## 2021-10-22 DIAGNOSIS — J301 Allergic rhinitis due to pollen: Secondary | ICD-10-CM | POA: Diagnosis not present

## 2021-11-02 ENCOUNTER — Ambulatory Visit (INDEPENDENT_AMBULATORY_CARE_PROVIDER_SITE_OTHER): Payer: Medicare Other | Admitting: *Deleted

## 2021-11-02 DIAGNOSIS — J309 Allergic rhinitis, unspecified: Secondary | ICD-10-CM | POA: Diagnosis not present

## 2021-11-16 ENCOUNTER — Ambulatory Visit (INDEPENDENT_AMBULATORY_CARE_PROVIDER_SITE_OTHER): Payer: Medicare Other | Admitting: Adult Health

## 2021-11-16 ENCOUNTER — Encounter: Payer: Self-pay | Admitting: Adult Health

## 2021-11-16 VITALS — BP 120/62 | HR 92 | Temp 98.8°F | Ht 64.0 in | Wt 131.0 lb

## 2021-11-16 DIAGNOSIS — E119 Type 2 diabetes mellitus without complications: Secondary | ICD-10-CM | POA: Diagnosis not present

## 2021-11-16 LAB — POCT GLYCOSYLATED HEMOGLOBIN (HGB A1C): Hemoglobin A1C: 7.1 % — AB (ref 4.0–5.6)

## 2021-11-16 NOTE — Progress Notes (Signed)
Subjective:    Patient ID: Kimberly Lam, female    DOB: Jul 06, 1946, 75 y.o.   MRN: [id number]  HPI 75 year old female who  has a past medical history of Acid reflux, Allergic rhinitis, Allergy, Anemia, Arthritis, Asthma, Bilateral sciatica, Cataract, Chronic bilateral low back pain, Colon polyps, Diverticulosis, DM type 2 (diabetes mellitus, type 2) ([place]), Environmental allergies, GERD (gastroesophageal reflux disease), Hyperlipidemia, Low back pain, Migraine, Osteoporosis, and PONV (postoperative nausea and vomiting).  She presents to the office today for 33-monthfollow-up regarding diabetes mellitus.  She is currently prescribed metformin 1000 mg ER twice daily.  She has not had any episodes of hypoglycemia.  She does take ramipril 2.5 mg for kidney protection.  In the past she has had excellent control of her diabetes.She does check her blood sugar at home and reports readings in the 100-200's, depending on what she eats and when she checks her blood sugar.  Lab Results  Component Value Date   HGBA1C 7.0 (A) 05/12/2021   Review of Systems See HPI   Past Medical History:  Diagnosis Date   Acid reflux    Allergic rhinitis    Allergy    Anemia    Arthritis    per MD- pt states no s/s of this    Asthma    Bilateral sciatica    Cataract    removed both eyes    Chronic bilateral low back pain    Colon polyps    Diverticulosis    DM type 2 (diabetes mellitus, type 2) (HCC)    Environmental allergies    flowers, pollen, trees   GERD (gastroesophageal reflux disease)    Hyperlipidemia    Low back pain    Migraine    past hx- control as allergies are controlled    Osteoporosis    PONV (postoperative nausea and vomiting)     Social History   Socioeconomic History   Marital status: Married    Spouse name: Not on file   Number of children: 2   Years of education: 16   Highest education level: Not on file  Occupational History   Occupation: Retired  Tobacco Use    Smoking status: Never   Smokeless tobacco: Never  Vaping Use   Vaping Use: Never used  Substance and Sexual Activity   Alcohol use: No    Alcohol/week: 0.0 standard drinks   Drug use: No   Sexual activity: Not on file  Other Topics Concern   Not on file  Social History Narrative      Mar[place] 1 son one daughter, [place] 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   No caffeine.          Lives at home with her husband and brother.   Right-handed.   No caffeine use.   Social Determinants of Health   Financial Resource Strain: Low Risk    Difficulty of Paying Living Expenses: Not hard at all  Food Insecurity: No Food Insecurity   Worried About RCharity fundraiserin the Last Year: Never true   RBroomallin the Last Year: Never true  Transportation Needs: No Transportation Needs   Lack of Transportation (Medical): No   Lack of Transportation (Non-Medical): No  Physical Activity: Insufficiently Active   Days of Exercise per Week: 3 days   Minutes of Exercise per Session: 30 min  Stress: No Stress  Concern Present   Feeling of Stress : Not at all  Social Connections: Moderately Isolated   Frequency of Communication with Friends and Family: More than three times a week   Frequency of Social Gatherings with Friends and Family: More than three times a week   Attends Religious Services: Never   Marine scientist or Organizations: No   Attends Music therapist: Never   Marital Status: Married  Human resources officer Violence: Not At Risk   Fear of Current or Ex-Partner: No   Emotionally Abused: No   Physically Abused: No   Sexually Abused: No    Past Surgical History:  Procedure Laterality Date   CATARACT EXTRACTION, BILATERAL Bilateral 2012   CESAREAN SECTION     x 2   COLONOSCOPY     > 10 yrs ago- unsure if removed polyps as was told sev.different things after this colon    DENTAL SURGERY      molars removed    DILATION AND CURETTAGE OF UTERUS     NASAL SINUS SURGERY     TUBAL LIGATION      Family History  Problem Relation Age of Onset   Asthma Father    Emphysema Father    Alcohol abuse Father    Diabetes Mellitus II Brother        x 2   Hyperlipidemia Brother    Hypertension Brother    Pancreatic cancer Brother    Prostate cancer Brother    Diabetes Brother    Heart disease Maternal Grandmother        hardening of the arteries   Heart murmur Daughter    Glaucoma Brother    Breast cancer Neg Hx    Colon cancer Neg Hx    Colon polyps Neg Hx    Esophageal cancer Neg Hx    Rectal cancer Neg Hx    Stomach cancer Neg Hx     Allergies  Allergen Reactions   Peanut-Containing Drug Products Anaphylaxis   Eggs Or Egg-Derived Products Nausea Only    Nausea only- can eat cakes with eggs    Milk-Related Compounds Nausea And Vomiting    Milk and cheese   Actos [Pioglitazone] Nausea Only   Soy Allergy Nausea Only    Pt states had allergy testing and was told allergic to soy-causes nausea but no other reactions   Vicodin [Hydrocodone-Acetaminophen] Nausea Only    Current Outpatient Medications on File Prior to Visit  Medication Sig Dispense Refill   Alcohol Swabs (B-D SINGLE USE SWABS REGULAR) PADS USE TO TEST BLOOD GLUCOSE FOUR TIMES DAILY 200 each 3   alendronate (FOSAMAX) 70 MG tablet TAKE 1 TABLET BY MOUTH  WEEKLY WITH 8 OZ OF PLAIN  WATER 30 MINUTES BEFORE  FIRST FOOD, DRINK OR MEDS.  STAY UPRIGHT FOR 30 MINS 12 tablet 3   Ascorbic Acid (VITAMIN C) 1000 MG tablet Take 1,000 mg by mouth as needed.     Azelastine HCl 0.15 % SOLN Use 1-2 sprays per nostril twice a day as needed for sinus drainage. 30 mL 5   Barberry-Oreg Grape-Goldenseal (BERBERINE COMPLEX PO) Take by mouth daily.     benzonatate (TESSALON PERLES) 100 MG capsule Take 1 capsule (100 mg total) by mouth 3 (three) times daily as needed. 20 capsule 0   Blood Glucose Monitoring Suppl (ONETOUCH VERIO  FLEX SYSTEM) w/Device KIT USE AS DIRECTED 1 kit 0   Calcium Carbonate-Vitamin D (CALCIUM 600+D PO) Take by mouth.  CINNAMON PO Take 1 tablet by mouth as needed.     Cranberry 450 MG CAPS Take 1 capsule by mouth as needed.     Cyanocobalamin (VITAMIN B-12 CR) 1500 MCG TBCR Take 1 tablet by mouth as needed.     Echinacea-Goldenseal (ECHINACEA COMB/GOLDEN SEAL PO) Take 1 tablet by mouth as needed. Reported on 12/16/2015     EPINEPHrine 0.3 mg/0.3 mL IJ SOAJ injection Inject 0.3 mg into the muscle as needed for anaphylaxis.     fluticasone (FLONASE) 50 MCG/ACT nasal spray USE 1 SPRAY IN BOTH  NOSTRILS TWICE DAILY 48 g 2   gabapentin (NEURONTIN) 300 MG capsule TAKE 2 CAPSULES BY MOUTH 3  TIMES DAILY 540 capsule 3   Green Tea, Camillia sinensis, (GREEN TEA PO) Take by mouth as needed. Reported on 12/16/2015     ipratropium (ATROVENT) 0.06 % nasal spray INSTILL 2 SPRAYS IN BOTH  NOSTRILS 3 TIMES DAILY AS  NEEDED FOR DRAINAGE 75 mL 2   Lactobacillus (ACIDOPHILUS PROBIOTIC PO) Take 1 tablet by mouth daily.     Lancet Devices (ONETOUCH DELICA PLUS LANCING) MISC USE AS DIRECTED 1 each 1   Lancets (ONETOUCH DELICA PLUS LANCET30G) MISC TEST IN THE MORNING , AT  NOON, IN THE EVENING AND AT BEDTIME 400 each 3   levocetirizine (XYZAL) 5 MG tablet TAKE 1 TABLET BY MOUTH IN  THE EVENING 30 tablet 0   MELATONIN ER PO Take by mouth.     metFORMIN (GLUCOPHAGE-XR) 500 MG 24 hr tablet TAKE 2 TABLETS BY MOUTH  TWICE DAILY 360 tablet 3   montelukast (SINGULAIR) 10 MG tablet TAKE 1 TABLET BY MOUTH AT  BEDTIME 30 tablet 4   Omega-3 Fatty Acids (OMEGA 3 PO) Take 1 tablet by mouth as needed.     omeprazole (PRILOSEC) 40 MG capsule Take 40 mg by mouth daily.     ONETOUCH VERIO test strip USE TO TEST BLOOD GLUCOSE 4 TIMES DAILY 400 strip 3   Polyethyl Glycol-Propyl Glycol (SYSTANE FREE OP) Apply 1 drop to eye daily. Uses thera work for dr eyes per pt     POTASSIUM PO Take by mouth.     ramipril (ALTACE) 2.5 MG capsule  TAKE 1 CAPSULE BY MOUTH  DAILY 90 capsule 3   simvastatin (ZOCOR) 20 MG tablet TAKE 1 TABLET BY MOUTH  DAILY 90 tablet 3   Spacer/Aero-Holding Chambers (AEROCHAMBER PLUS) inhaler Use as instructed with MDI 1 each 2   Stinging Nettle 2 % POWD Take 1 tablet by mouth as needed. Reported on 12/16/2015     traZODone (DESYREL) 50 MG tablet TAKE 1/2 TO 1 TABLET BY  MOUTH AT BEDTIME AS NEEDED  FOR SLEEP 90 tablet 3   TURMERIC PO Take 800 mg by mouth as needed. Reported on 12/16/2015     VENTOLIN HFA 108 (90 Base) MCG/ACT inhaler Inhale 2 puffs into the lungs every 6 (six) hours as needed for wheezing or shortness of breath.     No current facility-administered medications on file prior to visit.    BP 120/62   Pulse 92   Temp 98.8 F (37.1 C) (Oral)   Ht 5' 4" (1.626 m)   Wt 131 lb (59.4 kg)   SpO2 95%   BMI 22.49 kg/m       Objective:   Physical Exam Vitals and nursing note reviewed.  Constitutional:      Appearance: Normal appearance.  Cardiovascular:     Rate and Rhythm: Normal rate and regular   rhythm.     Pulses: Normal pulses.     Heart sounds: Normal heart sounds.  Pulmonary:     Effort: Pulmonary effort is normal.     Breath sounds: Normal breath sounds.  Musculoskeletal:        General: Normal range of motion.  Skin:    General: Skin is warm and dry.     Capillary Refill: Capillary refill takes less than 2 seconds.  Neurological:     General: No focal deficit present.     Mental Status: She is alert and oriented to person, place, and time.  Psychiatric:        Mood and Affect: Mood normal.        Behavior: Behavior normal.        Thought Content: Thought content normal.        Judgment: Judgment normal.      Assessment & Plan:  1. Diabetes mellitus without complication (HCC) - POC HgB A1c- 7.1  - No change in medications  - encouraged to cut back on sugars and carbs  - Follow up in three months for CPE or sooner if needed  Cory Nafziger, NP    

## 2021-11-16 NOTE — Patient Instructions (Signed)
It was great seeing you today   We will follow up with you regarding your lab work   Please let me know if you need anything   I will see you back after February 24th for your physical exam   [facility] system

## 2021-11-17 ENCOUNTER — Ambulatory Visit (INDEPENDENT_AMBULATORY_CARE_PROVIDER_SITE_OTHER): Payer: Medicare Other

## 2021-11-17 DIAGNOSIS — J309 Allergic rhinitis, unspecified: Secondary | ICD-10-CM

## 2021-11-19 ENCOUNTER — Other Ambulatory Visit: Payer: Self-pay | Admitting: Family Medicine

## 2021-11-19 ENCOUNTER — Telehealth: Payer: Self-pay | Admitting: Allergy

## 2021-11-19 ENCOUNTER — Other Ambulatory Visit: Payer: Self-pay | Admitting: Adult Health

## 2021-11-19 DIAGNOSIS — J309 Allergic rhinitis, unspecified: Secondary | ICD-10-CM

## 2021-11-22 NOTE — Telephone Encounter (Signed)
Called patient to verify pharmacy. I left a message for the patient to call back to provide pharmacy information.

## 2021-11-23 ENCOUNTER — Other Ambulatory Visit: Payer: Self-pay | Admitting: Allergy

## 2021-11-23 DIAGNOSIS — J309 Allergic rhinitis, unspecified: Secondary | ICD-10-CM

## 2021-11-23 NOTE — Telephone Encounter (Signed)
Medication has already been sent last night and they were sent to verified pharmacy.

## 2021-11-23 NOTE — Telephone Encounter (Signed)
Patient is using the same pharmacy on file.  [facility]

## 2021-11-30 ENCOUNTER — Ambulatory Visit (INDEPENDENT_AMBULATORY_CARE_PROVIDER_SITE_OTHER): Payer: Medicare Other | Admitting: *Deleted

## 2021-11-30 DIAGNOSIS — J309 Allergic rhinitis, unspecified: Secondary | ICD-10-CM

## 2021-12-07 ENCOUNTER — Other Ambulatory Visit: Payer: Self-pay | Admitting: *Deleted

## 2021-12-07 MED ORDER — EPINEPHRINE 0.3 MG/0.3ML IJ SOAJ: 0.3000 mg | INTRAMUSCULAR | 1 refills | Status: DC | PRN

## 2021-12-07 MED ORDER — VENTOLIN HFA 108 (90 BASE) MCG/ACT IN AERS: 2.0000 | INHALATION_SPRAY | Freq: Four times a day (QID) | RESPIRATORY_TRACT | 1 refills | Status: DC | PRN

## 2021-12-07 MED ORDER — AZELASTINE HCL 0.15 % NA SOLN: NASAL | 1 refills | Status: DC

## 2021-12-08 ENCOUNTER — Ambulatory Visit (INDEPENDENT_AMBULATORY_CARE_PROVIDER_SITE_OTHER): Payer: Medicare Other

## 2021-12-08 DIAGNOSIS — J309 Allergic rhinitis, unspecified: Secondary | ICD-10-CM

## 2021-12-15 ENCOUNTER — Ambulatory Visit (INDEPENDENT_AMBULATORY_CARE_PROVIDER_SITE_OTHER): Payer: Medicare Other

## 2021-12-15 ENCOUNTER — Telehealth: Payer: Self-pay | Admitting: Allergy

## 2021-12-15 ENCOUNTER — Other Ambulatory Visit: Payer: Self-pay

## 2021-12-15 DIAGNOSIS — J309 Allergic rhinitis, unspecified: Secondary | ICD-10-CM

## 2021-12-15 MED ORDER — FLUTICASONE PROPIONATE 50 MCG/ACT NA SUSP: 2.0000 | Freq: Every day | NASAL | 2 refills | Status: DC

## 2021-12-15 MED ORDER — MONTELUKAST SODIUM 10 MG PO TABS: 10.0000 mg | ORAL_TABLET | Freq: Every day | ORAL | 2 refills | Status: DC

## 2021-12-15 MED ORDER — LEVOCETIRIZINE DIHYDROCHLORIDE 5 MG PO TABS: 5.0000 mg | ORAL_TABLET | Freq: Every evening | ORAL | 2 refills | Status: DC

## 2021-12-15 MED ORDER — IPRATROPIUM BROMIDE 0.06 % NA SOLN: NASAL | 2 refills | Status: DC

## 2021-12-15 NOTE — Telephone Encounter (Signed)
Medications have been sent to [facility] per patient request.

## 2021-12-15 NOTE — Telephone Encounter (Signed)
Medications have been sent to [facility]  per patient request.

## 2021-12-15 NOTE — Telephone Encounter (Signed)
Patient's pharmacy called stating they sent request for refills for patient's levocetirizine, montelukast, flonase and ipratropium. Pharmacist states refills need to be sent to [facility] as that is the pharmacy [place] uses.

## 2021-12-21 ENCOUNTER — Ambulatory Visit (INDEPENDENT_AMBULATORY_CARE_PROVIDER_SITE_OTHER): Payer: Medicare HMO | Admitting: *Deleted

## 2021-12-21 DIAGNOSIS — J309 Allergic rhinitis, unspecified: Secondary | ICD-10-CM | POA: Diagnosis not present

## 2021-12-24 ENCOUNTER — Other Ambulatory Visit: Payer: Self-pay | Admitting: Allergy

## 2021-12-24 ENCOUNTER — Encounter: Payer: Self-pay | Admitting: Adult Health

## 2021-12-24 ENCOUNTER — Encounter: Payer: Self-pay | Admitting: Allergy

## 2021-12-24 ENCOUNTER — Encounter: Payer: Self-pay | Admitting: Neurology

## 2021-12-24 DIAGNOSIS — M810 Age-related osteoporosis without current pathological fracture: Secondary | ICD-10-CM

## 2021-12-24 DIAGNOSIS — G4700 Insomnia, unspecified: Secondary | ICD-10-CM

## 2021-12-24 NOTE — Telephone Encounter (Signed)
Patient needs to be seen every 6 months. One year supply is not appropriate. Refills were sent in until needed OV in March 2023.

## 2021-12-27 ENCOUNTER — Other Ambulatory Visit: Payer: Self-pay | Admitting: *Deleted

## 2021-12-27 MED ORDER — IPRATROPIUM BROMIDE 0.06 % NA SOLN: NASAL | 1 refills | Status: DC

## 2021-12-27 MED ORDER — GABAPENTIN 300 MG PO CAPS: ORAL_CAPSULE | ORAL | 1 refills | Status: DC

## 2021-12-27 NOTE — Telephone Encounter (Signed)
Patient called stating she needs a 3 month supply for her ipratropium sen to [place]. Patient states the prescription that was sent on 12-15-2021 is not for a three month supply. Patient was told by pharmacy she would need an appointment, she scheduled 03-21-2022.  Please advise.

## 2021-12-28 ENCOUNTER — Ambulatory Visit (INDEPENDENT_AMBULATORY_CARE_PROVIDER_SITE_OTHER): Payer: Medicare HMO | Admitting: *Deleted

## 2021-12-28 DIAGNOSIS — J309 Allergic rhinitis, unspecified: Secondary | ICD-10-CM | POA: Diagnosis not present

## 2021-12-29 MED ORDER — ACCU-CHEK FASTCLIX LANCETS MISC: 0 refills | Status: DC

## 2021-12-29 MED ORDER — BD SWAB SINGLE USE REGULAR PADS: MEDICATED_PAD | 3 refills | Status: DC

## 2021-12-29 MED ORDER — ACCU-CHEK GUIDE VI STRP: ORAL_STRIP | 12 refills | Status: DC

## 2021-12-29 MED ORDER — ACCU-CHEK GUIDE ME W/DEVICE KIT: PACK | 0 refills | Status: DC

## 2021-12-29 MED ORDER — METFORMIN HCL ER 500 MG PO TB24: 1000.0000 mg | ORAL_TABLET | Freq: Two times a day (BID) | ORAL | 3 refills | Status: DC

## 2021-12-29 MED ORDER — TRAZODONE HCL 50 MG PO TABS: 25.0000 mg | ORAL_TABLET | Freq: Every evening | ORAL | 3 refills | Status: DC | PRN

## 2021-12-29 MED ORDER — SIMVASTATIN 20 MG PO TABS: 20.0000 mg | ORAL_TABLET | Freq: Every day | ORAL | 3 refills | Status: DC

## 2021-12-29 MED ORDER — ONETOUCH DELICA PLUS LANCING MISC: 1 refills | Status: DC

## 2021-12-29 MED ORDER — ALENDRONATE SODIUM 70 MG PO TABS: ORAL_TABLET | ORAL | 3 refills | Status: DC

## 2022-01-10 MED ORDER — ACCU-CHEK GUIDE W/DEVICE KIT: 1.0000 | PACK | 0 refills | Status: DC

## 2022-01-10 NOTE — Addendum Note (Signed)
Addended by: Elza Rafter D on: 01/10/2022 07:39 PM   Modules accepted: Orders

## 2022-01-11 ENCOUNTER — Ambulatory Visit (INDEPENDENT_AMBULATORY_CARE_PROVIDER_SITE_OTHER): Payer: Medicare HMO

## 2022-01-11 DIAGNOSIS — J309 Allergic rhinitis, unspecified: Secondary | ICD-10-CM | POA: Diagnosis not present

## 2022-01-13 MED ORDER — ACCU-CHEK FASTCLIX LANCETS MISC: 0 refills | Status: DC

## 2022-01-13 MED ORDER — ACCU-CHEK FASTCLIX LANCET KIT: PACK | 0 refills | Status: DC

## 2022-01-13 NOTE — Addendum Note (Signed)
Addended by: Rebecca Eaton on: 01/13/2022 08:12 AM   Modules accepted: Orders

## 2022-01-16 MED ORDER — TRIAMCINOLONE ACETONIDE 55 MCG/ACT NA AERO: 2.0000 | INHALATION_SPRAY | Freq: Every day | NASAL | 2 refills | Status: DC

## 2022-01-16 NOTE — Addendum Note (Signed)
Addended by: Garnet Sierras on: 01/16/2022 07:58 PM   Modules accepted: Orders

## 2022-01-25 ENCOUNTER — Ambulatory Visit (INDEPENDENT_AMBULATORY_CARE_PROVIDER_SITE_OTHER): Payer: Medicare HMO | Admitting: *Deleted

## 2022-01-25 DIAGNOSIS — J309 Allergic rhinitis, unspecified: Secondary | ICD-10-CM

## 2022-01-27 ENCOUNTER — Other Ambulatory Visit: Payer: Self-pay | Admitting: Adult Health

## 2022-01-27 DIAGNOSIS — J069 Acute upper respiratory infection, unspecified: Secondary | ICD-10-CM | POA: Diagnosis not present

## 2022-01-27 DIAGNOSIS — J3489 Other specified disorders of nose and nasal sinuses: Secondary | ICD-10-CM | POA: Diagnosis not present

## 2022-02-04 ENCOUNTER — Other Ambulatory Visit: Payer: Self-pay | Admitting: Adult Health

## 2022-02-09 ENCOUNTER — Ambulatory Visit (INDEPENDENT_AMBULATORY_CARE_PROVIDER_SITE_OTHER): Payer: Medicare HMO

## 2022-02-09 DIAGNOSIS — J309 Allergic rhinitis, unspecified: Secondary | ICD-10-CM | POA: Diagnosis not present

## 2022-02-14 ENCOUNTER — Encounter: Payer: Self-pay | Admitting: Adult Health

## 2022-02-15 ENCOUNTER — Encounter: Payer: Self-pay | Admitting: Adult Health

## 2022-02-15 ENCOUNTER — Ambulatory Visit (INDEPENDENT_AMBULATORY_CARE_PROVIDER_SITE_OTHER): Payer: Medicare HMO | Admitting: Adult Health

## 2022-02-15 VITALS — BP 110/68 | HR 69 | Temp 98.5°F | Ht 65.0 in | Wt 132.0 lb

## 2022-02-15 DIAGNOSIS — E785 Hyperlipidemia, unspecified: Secondary | ICD-10-CM | POA: Diagnosis not present

## 2022-02-15 DIAGNOSIS — E1169 Type 2 diabetes mellitus with other specified complication: Secondary | ICD-10-CM | POA: Diagnosis not present

## 2022-02-15 DIAGNOSIS — E119 Type 2 diabetes mellitus without complications: Secondary | ICD-10-CM

## 2022-02-15 DIAGNOSIS — K219 Gastro-esophageal reflux disease without esophagitis: Secondary | ICD-10-CM

## 2022-02-15 DIAGNOSIS — M810 Age-related osteoporosis without current pathological fracture: Secondary | ICD-10-CM

## 2022-02-15 DIAGNOSIS — G4700 Insomnia, unspecified: Secondary | ICD-10-CM | POA: Diagnosis not present

## 2022-02-15 DIAGNOSIS — G6289 Other specified polyneuropathies: Secondary | ICD-10-CM | POA: Diagnosis not present

## 2022-02-15 DIAGNOSIS — Z0000 Encounter for general adult medical examination without abnormal findings: Secondary | ICD-10-CM | POA: Diagnosis not present

## 2022-02-15 LAB — COMPREHENSIVE METABOLIC PANEL
ALT: 15 U/L (ref 0–35)
AST: 21 U/L (ref 0–37)
Albumin: 4.4 g/dL (ref 3.5–5.2)
Alkaline Phosphatase: 50 U/L (ref 39–117)
BUN: 12 mg/dL (ref 6–23)
CO2: 29 mEq/L (ref 19–32)
Calcium: 10 mg/dL (ref 8.4–10.5)
Chloride: 103 mEq/L (ref 96–112)
Creatinine, Ser: 0.84 mg/dL (ref 0.40–1.20)
GFR: 68.44 mL/min (ref >60.00)
Glucose, Bld: 121 mg/dL — ABNORMAL HIGH (ref 70–99)
Potassium: 4.8 mEq/L (ref 3.5–5.1)
Sodium: 140 mEq/L (ref 135–145)
Total Bilirubin: 0.4 mg/dL (ref 0.2–1.2)
Total Protein: 7.5 g/dL (ref 6.0–8.3)

## 2022-02-15 LAB — CBC WITH DIFFERENTIAL/PLATELET
Basophils Absolute: 0 10*3/uL (ref 0.0–0.1)
Basophils Relative: 0.9 % (ref 0.0–3.0)
Eosinophils Absolute: 0.1 10*3/uL (ref 0.0–0.7)
Eosinophils Relative: 2.7 % (ref 0.0–5.0)
HCT: 41.9 % (ref 36.0–46.0)
Hemoglobin: 13.7 g/dL (ref 12.0–15.0)
Lymphocytes Relative: 27.5 % (ref 12.0–46.0)
Lymphs Abs: 0.9 10*3/uL (ref 0.7–4.0)
MCHC: 32.7 g/dL (ref 30.0–36.0)
MCV: 92.9 fl (ref 78.0–100.0)
Monocytes Absolute: 0.3 10*3/uL (ref 0.1–1.0)
Monocytes Relative: 10.3 % (ref 3.0–12.0)
Neutro Abs: 1.9 10*3/uL (ref 1.4–7.7)
Neutrophils Relative %: 58.6 % (ref 43.0–77.0)
Platelets: 222 10*3/uL (ref 150.0–400.0)
RBC: 4.51 Mil/uL (ref 3.87–5.11)
RDW: 13.7 % (ref 11.5–15.5)
WBC: 3.2 10*3/uL — ABNORMAL LOW (ref 4.0–10.5)

## 2022-02-15 LAB — LIPID PANEL
Cholesterol: 187 mg/dL (ref 0–200)
HDL: 72.4 mg/dL (ref >39.00)
LDL Cholesterol: 96 mg/dL (ref 0–99)
NonHDL: 115.07
Total CHOL/HDL Ratio: 3
Triglycerides: 93 mg/dL (ref 0.0–149.0)
VLDL: 18.6 mg/dL (ref 0.0–40.0)

## 2022-02-15 LAB — HEMOGLOBIN A1C: Hgb A1c MFr Bld: 7.7 % — ABNORMAL HIGH (ref 4.6–6.5)

## 2022-02-15 LAB — TSH: TSH: 1.13 u[IU]/mL (ref 0.35–5.50)

## 2022-02-15 LAB — VITAMIN D 25 HYDROXY (VIT D DEFICIENCY, FRACTURES): VITD: 55.96 ng/mL (ref 30.00–100.00)

## 2022-02-15 NOTE — Progress Notes (Signed)
Subjective:    Patient ID: Kimberly Lam, female    DOB: 04-16-1946, 76 y.o.   MRN: [id number]  HPI Patient presents for yearly preventative medicine examination. She is a pleasant 76 year old female who  has a past medical history of Acid reflux, Allergic rhinitis, Allergy, Anemia, Arthritis, Asthma, Bilateral sciatica, Cataract, Chronic bilateral low back pain, Colon polyps, Diverticulosis, DM type 2 (diabetes mellitus, type 2) ([place]), Environmental allergies, GERD (gastroesophageal reflux disease), Hyperlipidemia, Low back pain, Migraine, Osteoporosis, and PONV (postoperative nausea and vomiting).  DM Type 2 -managed with metformin 1000 mg extended release twice daily.  He does check his blood sugars at home and reports readings in the 100s to 200s depending on what she eats.  She denies episodes of hypoglycemia.  She does take ramipril 2.5 mg daily for kidney protection.  Lab Results  Component Value Date   HGBA1C 7.1 (A) 11/16/2021   Hyperlipidemia- Takes Simvastatin  20 mg daily denies myalgia or fatigue Lab Results  Component Value Date   CHOL 175 02/11/2021   HDL 69.00 02/11/2021   LDLCALC 85 02/11/2021   TRIG 104.0 02/11/2021   CHOLHDL 3 02/11/2021   Osteoporosis/osteopenia -takes Fosamax 70  mg weekly and vitamin D 1000 mg daily.  Her last bone density was in May 2021  Insomnia-takes trazodone 25- 50 mg nightly. Feels as though this works well for her.   Lumbar radiculopathy-managed by neurology.  Currently prescribed gabapentin 600-900 mg QHS.   Some days are better than others.  All immunizations and health maintenance protocols were reviewed with the patient and needed orders were placed.  Appropriate screening laboratory values were ordered for the patient including screening of hyperlipidemia, renal function and hepatic function.  Medication reconciliation,  past medical history, social history, problem list and allergies were reviewed in detail with the  patient  Goals were established with regard to weight loss, exercise, and  diet in compliance with medications  Wt Readings from Last 3 Encounters:  02/15/22 132 lb (59.9 kg)  11/16/21 131 lb (59.4 kg)  09/17/21 131 lb 4 oz (59.5 kg)    Review of Systems  Constitutional: Negative.   HENT: Negative.    Eyes: Negative.   Respiratory: Negative.    Cardiovascular: Negative.   Gastrointestinal: Negative.   Endocrine: Negative.   Genitourinary: Negative.   Musculoskeletal:  Positive for arthralgias and back pain.  Skin: Negative.   Allergic/Immunologic: Negative.   Neurological: Negative.   Hematological: Negative.   Psychiatric/Behavioral: Negative.    Past Medical History:  Diagnosis Date   Acid reflux    Allergic rhinitis    Allergy    Anemia    Arthritis    per MD- pt states no s/s of this    Asthma    Bilateral sciatica    Cataract    removed both eyes    Chronic bilateral low back pain    Colon polyps    Diverticulosis    DM type 2 (diabetes mellitus, type 2) (H[facility])    Environmental allergies    flowers, pollen, trees   GERD (gastroesophageal reflux disease)    Hyperlipidemia    Low back pain    Migraine    past hx- control as allergies are controlled    Osteoporosis    PONV (postoperative nausea and vomiting)     Social History   Socioeconomic History   Marital status: Married    Spouse name: Not on file   Number of children: 2  Years of education: 65   Highest education level: Not on file  Occupational History   Occupation: Retired  Tobacco Use   Smoking status: Never   Smokeless tobacco: Never  Vaping Use   Vaping Use: Never used  Substance and Sexual Activity   Alcohol use: No    Alcohol/week: 0.0 standard drinks   Drug use: No   Sexual activity: Not on file  Other Topics Concern   Not on file  Social History Narrative      Married 1 son one daughter, relocated to [place] after living in [place] for many years. 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   No caffeine.          Lives at home with her husband and brother.   Right-handed.   No caffeine use.   Social Determinants of Health   Financial Resource Strain: Low Risk    Difficulty of Paying Living Expenses: Not hard at all  Food Insecurity: No Food Insecurity   Worried About Programme researcher, broadcasting/film/video in the Last Year: Never true   Ran Out of Food in the Last Year: Never true  Transportation Needs: No Transportation Needs   Lack of Transportation (Medical): No   Lack of Transportation (Non-Medical): No  Physical Activity: Insufficiently Active   Days of Exercise per Week: 3 days   Minutes of Exercise per Session: 30 min  Stress: No Stress Concern Present   Feeling of Stress : Not at all  Social Connections: Moderately Isolated   Frequency of Communication with Friends and Family: More than three times a week   Frequency of Social Gatherings with Friends and Family: More than three times a week   Attends Religious Services: Never   Database administrator or Organizations: No   Attends Engineer, structural: Never   Marital Status: Married  Catering manager Violence: Not At Risk   Fear of Current or Ex-Partner: No   Emotionally Abused: No   Physically Abused: No   Sexually Abused: No    Past Surgical History:  Procedure Laterality Date   CATARACT EXTRACTION, BILATERAL Bilateral 2012   CESAREAN SECTION     x 2   COLONOSCOPY     > 10 yrs ago- unsure if removed polyps as was told sev.different things after this colon    DENTAL SURGERY     molars removed    DILATION AND CURETTAGE OF UTERUS     NASAL SINUS SURGERY     TUBAL LIGATION      Family History  Problem Relation Age of Onset   Asthma Father    Emphysema Father    Alcohol abuse Father    Diabetes Mellitus II Brother        x 2   Hyperlipidemia Brother    Hypertension Brother    Pancreatic cancer Brother    Prostate cancer Brother    Diabetes Brother    Heart  disease Maternal Grandmother        hardening of the arteries   Heart murmur Daughter    Glaucoma Brother    Breast cancer Neg Hx    Colon cancer Neg Hx    Colon polyps Neg Hx    Esophageal cancer Neg Hx    Rectal cancer Neg Hx    Stomach cancer Neg Hx     Allergies  Allergen Reactions   Peanut-Containing Drug Products Anaphylaxis   Eggs Or Egg-Derived Products Nausea Only  Nausea only- can eat cakes with eggs    Milk-Related Compounds Nausea And Vomiting    Milk and cheese   Actos [Pioglitazone] Nausea Only   Soy Allergy Nausea Only    Pt states had allergy testing and was told allergic to soy-causes nausea but no other reactions   Vicodin [Hydrocodone-Acetaminophen] Nausea Only    Current Outpatient Medications on File Prior to Visit  Medication Sig Dispense Refill   Accu-Chek Softclix Lancets lancets USE TO CHECK BLOOD GLUCOSE 3 TIMES DAILY OR AS NEEDED 100 each 0   Alcohol Swabs (B-D SINGLE USE SWABS REGULAR) PADS USE TO TEST BLOOD GLUCOSE FOUR TIMES DAILY 200 each 3   alendronate (FOSAMAX) 70 MG tablet TAKE 1 TABLET BY MOUTH  WEEKLY WITH 8 OZ OF PLAIN  WATER 30 MINUTES BEFORE  FIRST FOOD, DRINK OR MEDS.  STAY UPRIGHT FOR 30 MINS 12 tablet 3   Ascorbic Acid (VITAMIN C) 1000 MG tablet Take 1,000 mg by mouth as needed.     Barberry-Oreg Grape-Goldenseal (BERBERINE COMPLEX PO) Take by mouth daily.     benzonatate (TESSALON PERLES) 100 MG capsule Take 1 capsule (100 mg total) by mouth 3 (three) times daily as needed. 20 capsule 0   Blood Glucose Monitoring Suppl (ACCU-CHEK GUIDE) w/Device KIT 1 kit by Does not apply route as directed. 1 kit 0   Calcium Carbonate-Vitamin D (CALCIUM 600+D PO) Take by mouth.     CINNAMON PO Take 1 tablet by mouth as needed.     Cranberry 450 MG CAPS Take 1 capsule by mouth as needed.     Cyanocobalamin (VITAMIN B-12 CR) 1500 MCG TBCR Take 1 tablet by mouth as needed.     Echinacea-Goldenseal (ECHINACEA COMB/GOLDEN SEAL PO) Take 1 tablet by mouth  as needed. Reported on 12/16/2015     EPINEPHrine 0.3 mg/0.3 mL IJ SOAJ injection Inject 0.3 mg into the muscle as needed for anaphylaxis.     EPINEPHrine 0.3 mg/0.3 mL IJ SOAJ injection Inject 0.3 mg into the muscle as needed for anaphylaxis. 3 each 1   gabapentin (NEURONTIN) 300 MG capsule TAKE 2 CAPSULES BY MOUTH 3  TIMES DAILY 540 capsule 1   glucose blood (ACCU-CHEK GUIDE) test strip Use as instructed 100 each 12   Green Tea, Camillia sinensis, (GREEN TEA PO) Take by mouth as needed. Reported on 12/16/2015     Lactobacillus (ACIDOPHILUS PROBIOTIC PO) Take 1 tablet by mouth daily.     Lancet Devices (ONETOUCH DELICA PLUS LANCING) MISC USE AS DIRECTED 1 each 1   Lancets Misc. (ACCU-CHEK FASTCLIX LANCET) KIT Use to check blood sugars 3 times a day. 1 kit 0   levocetirizine (XYZAL) 5 MG tablet Take 1 tablet (5 mg total) by mouth every evening. 30 tablet 2   MELATONIN ER PO Take by mouth.     metFORMIN (GLUCOPHAGE-XR) 500 MG 24 hr tablet Take 2 tablets (1,000 mg total) by mouth 2 (two) times daily. 360 tablet 3   montelukast (SINGULAIR) 10 MG tablet Take 1 tablet (10 mg total) by mouth at bedtime. 30 tablet 2   Omega-3 Fatty Acids (OMEGA 3 PO) Take 1 tablet by mouth as needed.     omeprazole (PRILOSEC) 40 MG capsule Take 40 mg by mouth daily.     ONETOUCH VERIO test strip USE TO TEST BLOOD GLUCOSE 4 TIMES DAILY 400 strip 3   Polyethyl Glycol-Propyl Glycol (SYSTANE FREE OP) Apply 1 drop to eye daily. Uses thera work for dr eyes per pt  POTASSIUM PO Take by mouth.     ramipril (ALTACE) 2.5 MG capsule TAKE 1 CAPSULE BY MOUTH  DAILY 90 capsule 3   simvastatin (ZOCOR) 20 MG tablet Take 1 tablet (20 mg total) by mouth daily. 90 tablet 3   Spacer/Aero-Holding Chambers (AEROCHAMBER PLUS) inhaler Use as instructed with MDI 1 each 2   Stinging Nettle 2 % POWD Take 1 tablet by mouth as needed. Reported on 12/16/2015     traZODone (DESYREL) 50 MG tablet Take 0.5-1 tablets (25-50 mg total) by mouth at  bedtime as needed. for sleep 90 tablet 3   triamcinolone (NASACORT) 55 MCG/ACT AERO nasal inhaler Place 2 sprays into the nose daily. For nasal congestion. 3 each 2   TURMERIC PO Take 800 mg by mouth as needed. Reported on 12/16/2015     VENTOLIN HFA 108 (90 Base) MCG/ACT inhaler Inhale 2 puffs into the lungs every 6 (six) hours as needed for wheezing or shortness of breath. 54 g 1   [DISCONTINUED] Azelastine HCl 0.15 % SOLN Use 1-2 sprays per nostril twice a day as needed for sinus drainage. 90 mL 1   [DISCONTINUED] fluticasone (FLONASE) 50 MCG/ACT nasal spray Place 2 sprays into both nostrils daily. 16 g 2   [DISCONTINUED] ipratropium (ATROVENT) 0.06 % nasal spray INSTILL 2 SPRAYS IN BOTH  NOSTRILS 3 TIMES DAILY AS  NEEDED FOR DRAINAGE 45 mL 1   No current facility-administered medications on file prior to visit.    There were no vitals taken for this visit.      Objective:   Physical Exam Vitals and nursing note reviewed.  Constitutional:      General: She is not in acute distress.    Appearance: Normal appearance. She is well-developed. She is not ill-appearing.  HENT:     Head: Normocephalic and atraumatic.     Right Ear: Tympanic membrane, ear canal and external ear normal. There is no impacted cerumen.     Left Ear: Tympanic membrane, ear canal and external ear normal. There is no impacted cerumen.     Nose: Nose normal. No congestion or rhinorrhea.     Mouth/Throat:     Mouth: Mucous membranes are moist.     Pharynx: Oropharynx is clear. No oropharyngeal exudate or posterior oropharyngeal erythema.  Eyes:     General:        Right eye: No discharge.        Left eye: No discharge.     Extraocular Movements: Extraocular movements intact.     Conjunctiva/sclera: Conjunctivae normal.     Pupils: Pupils are equal, round, and reactive to light.  Neck:     Thyroid: No thyromegaly.     Vascular: No carotid bruit.     Trachea: No tracheal deviation.  Cardiovascular:     Rate  and Rhythm: Normal rate and regular rhythm.     Pulses: Normal pulses.     Heart sounds: Normal heart sounds. No murmur heard.   No friction rub. No gallop.  Pulmonary:     Effort: Pulmonary effort is normal. No respiratory distress.     Breath sounds: Normal breath sounds. No stridor. No wheezing, rhonchi or rales.  Chest:     Chest wall: No tenderness.  Abdominal:     General: Abdomen is flat. Bowel sounds are normal. There is no distension.     Palpations: Abdomen is soft. There is no mass.     Tenderness: There is no abdominal tenderness. There is no right  CVA tenderness, left CVA tenderness, guarding or rebound.     Hernia: No hernia is present.  Musculoskeletal:        General: No swelling, tenderness, deformity or signs of injury. Normal range of motion.     Cervical back: Normal range of motion and neck supple.     Right lower leg: No edema.     Left lower leg: No edema.  Lymphadenopathy:     Cervical: No cervical adenopathy.  Skin:    General: Skin is warm and dry.     Coloration: Skin is not jaundiced or pale.     Findings: No bruising, erythema, lesion or rash.  Neurological:     General: No focal deficit present.     Mental Status: She is alert and oriented to person, place, and time.     Cranial Nerves: No cranial nerve deficit.     Sensory: No sensory deficit.     Motor: No weakness.     Coordination: Coordination normal.     Gait: Gait normal.     Deep Tendon Reflexes: Reflexes normal.  Psychiatric:        Mood and Affect: Mood normal.        Behavior: Behavior normal.        Thought Content: Thought content normal.        Judgment: Judgment normal.      Assessment & Plan:  1. Routine general medical examination at a health care facility - Continue heart healthy diet and exercise - Follow up in one year or sooner if needed - CBC with Differential/Platelet; Future - Comprehensive metabolic panel; Future - Hemoglobin A1c; Future - Lipid panel; Future -  TSH; Future - DG Bone Density; Future  2. Insomnia, unspecified type - Continue Trazodone QHS   3. Diabetes mellitus without complication ([place]) - Consider adding agent  - Follow up in 3 months or sooner if needed - CBC with Differential/Platelet; Future - Comprehensive metabolic panel; Future - Hemoglobin A1c; Future - Lipid panel; Future - TSH; Future - DG Bone Density; Future  4. Hyperlipidemia associated with type 2 diabetes mellitus ([place]) - Consider increase in statin  - CBC with Differential/Platelet; Future - Comprehensive metabolic panel; Future - Hemoglobin A1c; Future - Lipid panel; Future - TSH; Future - DG Bone Density; Future  5. Other polyneuropathy - Continue with Gabapentin  - CBC with Differential/Platelet; Future - Comprehensive metabolic panel; Future - Hemoglobin A1c; Future - Lipid panel; Future - TSH; Future - DG Bone Density; Future  6. Gastroesophageal reflux disease, unspecified whether esophagitis present - Continue PPI  - CBC with Differential/Platelet; Future - Comprehensive metabolic panel; Future - Hemoglobin A1c; Future - Lipid panel; Future - TSH; Future - DG Bone Density; Future  7. Age-related osteoporosis without current pathological fracture  - DG Bone Density; Future - VITAMIN D 25 Hydroxy (Vit-D Deficiency, Fractures); Future  [name], NP

## 2022-02-15 NOTE — Patient Instructions (Addendum)
It was great seeing you today   We will follow up with you regarding your lab work   Please let me know if you need anything   

## 2022-02-16 ENCOUNTER — Telehealth: Payer: Self-pay | Admitting: Adult Health

## 2022-02-16 MED ORDER — GLIPIZIDE ER 5 MG PO TB24
5.0000 mg | ORAL_TABLET | Freq: Every day | ORAL | 3 refills | Status: DC
Start: 2022-02-16 — End: 2022-11-29

## 2022-02-16 NOTE — Telephone Encounter (Signed)
Patient returned call for labs but Tillie Rung was unavailable. I let patient know she would give her a call later when she was available. ? ? ? ?Please advise  ?

## 2022-02-16 NOTE — Telephone Encounter (Signed)
Patient notified of update  and verbalized understanding. 

## 2022-02-16 NOTE — Addendum Note (Signed)
Addended by: Gwenyth Ober R on: 02/16/2022 03:55 PM   Modules accepted: Orders

## 2022-02-22 ENCOUNTER — Ambulatory Visit (INDEPENDENT_AMBULATORY_CARE_PROVIDER_SITE_OTHER): Payer: Medicare HMO | Admitting: *Deleted

## 2022-02-22 DIAGNOSIS — J309 Allergic rhinitis, unspecified: Secondary | ICD-10-CM | POA: Diagnosis not present

## 2022-02-28 ENCOUNTER — Other Ambulatory Visit: Payer: Self-pay | Admitting: Adult Health

## 2022-02-28 DIAGNOSIS — Z1231 Encounter for screening mammogram for malignant neoplasm of breast: Secondary | ICD-10-CM

## 2022-03-04 ENCOUNTER — Telehealth: Payer: Self-pay | Admitting: Adult Health

## 2022-03-04 NOTE — Telephone Encounter (Signed)
Pt is calling and she needs new rx accu-chek fastclix drum lancet send to  ?[place], [place] Phone:  [phone]  ?Fax:  [phone]  ?  ? ?

## 2022-03-07 ENCOUNTER — Ambulatory Visit (INDEPENDENT_AMBULATORY_CARE_PROVIDER_SITE_OTHER): Payer: Medicare HMO

## 2022-03-07 DIAGNOSIS — J309 Allergic rhinitis, unspecified: Secondary | ICD-10-CM | POA: Diagnosis not present

## 2022-03-09 MED ORDER — ACCU-CHEK FASTCLIX LANCETS MISC
2 refills | Status: DC
Start: 2022-03-09 — End: 2022-05-18

## 2022-03-09 NOTE — Telephone Encounter (Signed)
Rx sent to [facility].  

## 2022-03-15 ENCOUNTER — Ambulatory Visit (INDEPENDENT_AMBULATORY_CARE_PROVIDER_SITE_OTHER): Payer: Medicare HMO

## 2022-03-15 DIAGNOSIS — J309 Allergic rhinitis, unspecified: Secondary | ICD-10-CM

## 2022-03-16 ENCOUNTER — Other Ambulatory Visit: Payer: Self-pay | Admitting: Adult Health

## 2022-03-16 NOTE — Progress Notes (Signed)
VIALS EXP 03-17-23 ?

## 2022-03-17 DIAGNOSIS — J3089 Other allergic rhinitis: Secondary | ICD-10-CM | POA: Diagnosis not present

## 2022-03-18 DIAGNOSIS — J302 Other seasonal allergic rhinitis: Secondary | ICD-10-CM | POA: Diagnosis not present

## 2022-03-20 NOTE — Progress Notes (Deleted)
? ?Follow Up Note ? ?RE: Kimberly Lam MRN: [id number] DOB: 1946-04-01 ?Date of Office Visit: 03/21/2022 ? ?Referring provider: [name], NP ?Primary care provider: [name], NP ? ?Chief Complaint: No chief complaint on file. ? ?History of Present Illness: ?I had the pleasure of seeing Kimberly Lam for a follow up visit at the [facility] on 03/20/2022. She is a 76 y.o. female, who is being followed for asthma, allergic rhinoconjunctivitis on AIT, nasal septal perforation, food allergy and GERD. Her previous allergy office visit was on 09/17/2021 with Dr. Maudie Mercury. Today is a regular follow up visit. ? ?Moderate persistent asthma, uncomplicated ?Past history - Felt better with [facility] but could not afford to purchase after samples ran out. Reports that inhalers cause her mouth to get sores and become white ?Interim history - stable with no inhaler use.  ?Today's spirometry was normal.  ?Daily controller medication(s):  Continue Singulair 10mg  daily.  ?May use albuterol rescue inhaler 2 puffs or nebulizer every 4 to 6 hours as needed for shortness of breath, chest tightness, coughing, and wheezing. May use albuterol rescue inhaler 2 puffs 5 to 15 minutes prior to strenuous physical activities. Monitor frequency of use.  ?  ?Perennial and seasonal allergic rhinoconjunctivitis ?Past history - started AIT on 10/27/2015 (TREE/GRASS-WEED-MITE-CAT-DOG/MOLD-CR).  ?Interim history - Unable to go more than 2 weeks without injections. [place] of [place]. ?Continue environmental control measures. ?Continue allergy injections every 2 weeks.  ?Once you have a new allergist - let us know and will fax over records and instructions regarding the allergy shots. ?Discussed with patient that she can NOT inject allergy shots at home even though there's a family member in the medical field giving it.  ?May use over the counter antihistamines such as Zyrtec (cetirizine), Claritin (loratadine),  Allegra (fexofenadine) as needed in the morning.  ?USE saline lavage before use the nasal sprays in the morning and at night. ?Use Flonase (fluticasone) nasal spray 1 spray per nostril twice a day as needed for nasal congestion.  ?May use Atrovent 2 sprays in each nostril 3 times a day as needed for drainage.  ?Continue montelukast 10mg  daily.  ?  ?Nasal septal perforation ?Past history - Saw ENT in the past (no intervention). ?Interim history - unchanged. ?Monitor symptoms.   ?Follow up with ENT as needed.  ?  ?Anaphylactic shock due to adverse food reaction ?Past history - soy caused increased rhinitis symptoms. ?Interim history - No reactions. ?Continue to avoid peanuts, cow's milk, soybean. ?For mild symptoms you can take over the counter antihistamines such as Benadryl and monitor symptoms closely. If symptoms worsen or if you have severe symptoms including breathing issues, throat closure, significant swelling, whole body hives, severe diarrhea and vomiting, lightheadedness then inject epinephrine and seek immediate medical care afterwards. ?  ?Gastroesophageal reflux disease ?Stable.  ?Continue taking omeprazole 40mg  daily as needed. ?  ?Return in about 6 months (around 03/17/2022), or if symptoms worsen or fail to improve. ? ? ?01/27/2022 ENT visit: ?"Kimberly Lam is a 76 y.o. female with upper respiratory infection and septal perforation. ? ?- The tonsils do not look infected. She was reassured. She likely has a viral URI. I suggested she get tested for COVID. ?- Her septal perforation remains stable. I again offered surgical repair. She will consider as she continues regular moisturization." ? ?Assessment and Plan: ?Kimberly Lam is a 76 y.o. female with: ?No problem-specific Assessment & Plan notes found for this encounter. ? ?  No follow-ups on file. ? ?No orders of the defined types were placed in this encounter. ? ?Lab Orders  ?No laboratory test(s) ordered today  ? ? ?Diagnostics: ?Spirometry:  ?Tracings reviewed.  Her effort: {Blank single:19197::"Good reproducible efforts.","It was hard to get consistent efforts and there is a question as to whether this reflects a maximal maneuver.","Poor effort, data can not be interpreted."} ?FVC: ***L ?FEV1: ***L, ***% predicted ?FEV1/FVC ratio: ***% ?Interpretation: {Blank single:19197::"Spirometry consistent with mild obstructive disease","Spirometry consistent with moderate obstructive disease","Spirometry consistent with severe obstructive disease","Spirometry consistent with possible restrictive disease","Spirometry consistent with mixed obstructive and restrictive disease","Spirometry uninterpretable due to technique","Spirometry consistent with normal pattern","No overt abnormalities noted given today's efforts"}.  ?Please see scanned spirometry results for details. ? ?Skin Testing: {Blank single:19197::"Select foods","Environmental allergy panel","Environmental allergy panel and select foods","Food allergy panel","None","Deferred due to recent antihistamines use"}. ?*** ?Results discussed with patient/family. ? ? ?Medication List:  ?Current Outpatient Medications  ?Medication Sig Dispense Refill  ? Accu-Chek FastClix Lancets MISC Use to check blood glucose BID 102 each 2  ? Alcohol Swabs (B-D SINGLE USE SWABS REGULAR) PADS USE TO TEST BLOOD GLUCOSE FOUR TIMES DAILY 200 each 3  ? alendronate (FOSAMAX) 70 MG tablet TAKE 1 TABLET BY MOUTH  WEEKLY WITH 8 OZ OF PLAIN  WATER 30 MINUTES BEFORE  FIRST FOOD, DRINK OR MEDS.  STAY UPRIGHT FOR 30 MINS 12 tablet 3  ? Ascorbic Acid (VITAMIN C) 1000 MG tablet Take 1,000 mg by mouth as needed.    ? Barberry-Oreg Grape-Goldenseal (BERBERINE COMPLEX PO) Take by mouth daily.    ? Blood Glucose Monitoring Suppl (ACCU-CHEK GUIDE) w/Device KIT 1 kit by Does not apply route as directed. 1 kit 0  ? Calcium Carbonate-Vitamin D (CALCIUM 600+D PO) Take by mouth.    ? CINNAMON PO Take 1 tablet by mouth as needed.    ? Cranberry 450 MG CAPS Take 1 capsule by  mouth as needed.    ? Cyanocobalamin (VITAMIN B-12 CR) 1500 MCG TBCR Take 1 tablet by mouth as needed.    ? Echinacea-Goldenseal (ECHINACEA COMB/GOLDEN SEAL PO) Take 1 tablet by mouth as needed. Reported on 12/16/2015    ? EPINEPHrine 0.3 mg/0.3 mL IJ SOAJ injection Inject 0.3 mg into the muscle as needed for anaphylaxis.    ? EPINEPHrine 0.3 mg/0.3 mL IJ SOAJ injection Inject 0.3 mg into the muscle as needed for anaphylaxis. 3 each 1  ? gabapentin (NEURONTIN) 300 MG capsule TAKE 2 CAPSULES BY MOUTH 3  TIMES DAILY 540 capsule 1  ? glipiZIDE (GLUCOTROL XL) 5 MG 24 hr tablet Take 1 tablet (5 mg total) by mouth daily with breakfast. 90 tablet 3  ? glucose blood (ACCU-CHEK GUIDE) test strip Use as instructed 100 each 12  ? Green Tea, Camillia sinensis, (GREEN TEA PO) Take by mouth as needed. Reported on 12/16/2015    ? Lactobacillus (ACIDOPHILUS PROBIOTIC PO) Take 1 tablet by mouth daily.    ? Lancet Devices (ONETOUCH DELICA PLUS LANCING) MISC USE AS DIRECTED 1 each 1  ? Lancets Misc. (ACCU-CHEK FASTCLIX LANCET) KIT USE AS DIRECTED 1 kit 0  ? levocetirizine (XYZAL) 5 MG tablet Take 1 tablet (5 mg total) by mouth every evening. 30 tablet 2  ? MELATONIN ER PO Take by mouth.    ? metFORMIN (GLUCOPHAGE-XR) 500 MG 24 hr tablet Take 2 tablets (1,000 mg total) by mouth 2 (two) times daily. 360 tablet 3  ? montelukast (SINGULAIR) 10 MG tablet Take 1 tablet (10 mg total)  by mouth at bedtime. 30 tablet 2  ? Omega-3 Fatty Acids (OMEGA 3 PO) Take 1 tablet by mouth as needed.    ? omeprazole (PRILOSEC) 40 MG capsule Take 40 mg by mouth daily.    ? ONETOUCH VERIO test strip USE TO TEST BLOOD GLUCOSE 4 TIMES DAILY 400 strip 3  ? Polyethyl Glycol-Propyl Glycol (SYSTANE FREE OP) Apply 1 drop to eye daily. Uses thera work for dr eyes per pt    ? POTASSIUM PO Take by mouth.    ? ramipril (ALTACE) 2.5 MG capsule TAKE 1 CAPSULE BY MOUTH  DAILY 90 capsule 3  ? simvastatin (ZOCOR) 20 MG tablet Take 1 tablet (20 mg total) by mouth daily. 90  tablet 3  ? Spacer/Aero-Holding Chambers (AEROCHAMBER PLUS) inhaler Use as instructed with MDI 1 each 2  ? Stinging Nettle 2 % POWD Take 1 tablet by mouth as needed. Reported on 12/16/2015    ? traZODone (DES

## 2022-03-21 ENCOUNTER — Ambulatory Visit: Payer: Medicare HMO | Admitting: Allergy

## 2022-03-21 ENCOUNTER — Other Ambulatory Visit: Payer: Self-pay | Admitting: *Deleted

## 2022-03-21 ENCOUNTER — Other Ambulatory Visit: Payer: Self-pay | Admitting: Allergy

## 2022-03-21 DIAGNOSIS — J301 Allergic rhinitis due to pollen: Secondary | ICD-10-CM | POA: Diagnosis not present

## 2022-03-21 DIAGNOSIS — J4540 Moderate persistent asthma, uncomplicated: Secondary | ICD-10-CM

## 2022-03-21 DIAGNOSIS — J3489 Other specified disorders of nose and nasal sinuses: Secondary | ICD-10-CM

## 2022-03-21 DIAGNOSIS — J3089 Other allergic rhinitis: Secondary | ICD-10-CM

## 2022-03-21 DIAGNOSIS — T7800XD Anaphylactic reaction due to unspecified food, subsequent encounter: Secondary | ICD-10-CM

## 2022-03-21 DIAGNOSIS — K219 Gastro-esophageal reflux disease without esophagitis: Secondary | ICD-10-CM

## 2022-03-21 NOTE — Patient Outreach (Signed)
[facility]) Care Management ? ?03/21/2022 ? ?Kimberly Lam ?04-Mar-1946 ?[id number] ? ? ?Patient is being transferred to the Chronic Care Management program with provider office.  ? ?Johny Shock BSN RN ?[place] Management ?[phone] ? ?

## 2022-03-21 NOTE — Patient Outreach (Signed)
[facility]) Care Management ? ?03/21/2022 ? ?Grace Blight Drennen ?09-Jan-1946 ?[id number] ? ? ?Patient has changed insurance to [place]. RN Health Coach transferring to [facility].  ? ?Johny Shock BSN RN ?[place] Management ?[phone] ? ?

## 2022-03-23 ENCOUNTER — Telehealth: Payer: Self-pay | Admitting: Allergy

## 2022-03-23 ENCOUNTER — Encounter: Payer: Self-pay | Admitting: Adult Health

## 2022-03-23 NOTE — Telephone Encounter (Signed)
Patient called and said that she having pain in cheeks and under her eyes. Sinus infection [facility] [address] [phone]. ?

## 2022-03-23 NOTE — Telephone Encounter (Signed)
Noted. ?Continue nasal sprays and meds. ?Okay to take sudafed for a few days. ? ?2 days of symptoms do not warrant antibiotics.  ?If no improvement by next week let us know.  ?

## 2022-03-23 NOTE — Telephone Encounter (Signed)
Called and spoke with patient and she stated that her symptoms have been going on for the last 2 days yesterday being the worst after being outside around the pollen, she is having pressure and pain in her cheeks and under her eyes. No mucous production. She states that she is taking Xyzal and using the Flonase and Atrovent. She did a nasal saline spray 2 days ago but not yesterday. She did state that she picked up Sudafed to take to help with the pressure in her head.  ?

## 2022-03-23 NOTE — Telephone Encounter (Signed)
Called patient and advised, patient verbalized understanding.  

## 2022-03-24 ENCOUNTER — Encounter: Payer: Self-pay | Admitting: Allergy

## 2022-03-28 ENCOUNTER — Ambulatory Visit: Payer: Medicare Other | Admitting: *Deleted

## 2022-03-28 ENCOUNTER — Encounter: Payer: Self-pay | Admitting: Family Medicine

## 2022-03-28 ENCOUNTER — Other Ambulatory Visit: Payer: Self-pay | Admitting: Family Medicine

## 2022-03-28 ENCOUNTER — Ambulatory Visit: Payer: Medicare HMO | Admitting: Family Medicine

## 2022-03-28 ENCOUNTER — Encounter: Payer: Self-pay | Admitting: Allergy

## 2022-03-28 VITALS — Ht 64.0 in | Wt 129.0 lb

## 2022-03-28 DIAGNOSIS — J3089 Other allergic rhinitis: Secondary | ICD-10-CM | POA: Diagnosis not present

## 2022-03-28 DIAGNOSIS — J4540 Moderate persistent asthma, uncomplicated: Secondary | ICD-10-CM

## 2022-03-28 DIAGNOSIS — K219 Gastro-esophageal reflux disease without esophagitis: Secondary | ICD-10-CM

## 2022-03-28 DIAGNOSIS — B9689 Other specified bacterial agents as the cause of diseases classified elsewhere: Secondary | ICD-10-CM | POA: Diagnosis not present

## 2022-03-28 DIAGNOSIS — J019 Acute sinusitis, unspecified: Secondary | ICD-10-CM

## 2022-03-28 DIAGNOSIS — T7800XD Anaphylactic reaction due to unspecified food, subsequent encounter: Secondary | ICD-10-CM

## 2022-03-28 MED ORDER — AMOXICILLIN-POT CLAVULANATE 875-125 MG PO TABS: 1.0000 | ORAL_TABLET | Freq: Two times a day (BID) | ORAL | 0 refills | Status: AC

## 2022-03-28 MED ORDER — AMOXICILLIN-POT CLAVULANATE 875-125 MG PO TABS: 1.0000 | ORAL_TABLET | Freq: Two times a day (BID) | ORAL | 0 refills | Status: DC

## 2022-03-28 NOTE — Patient Instructions (Addendum)
Acute sinusitis ?Begin Augmentin 875 mg 1 tablet twice a day for the next 10 days for acute sinusitis ?Continue nasal saline rinses followed by Flonase nasal spray ?Restart Mucinex 600 to 1200 mg twice a day and increase fluid intake to thin nasal secretions ? ?Asthma ?Continue montelukast 10 mg once a day to prevent cough or wheeze ?Continue albuterol 2 puffs once every 4 hours as needed for cough or wheeze ?You may use albuterol 2 puffs 5- 15 minutes before activity to decrease cough or wheeze ? ?Allergic rhinitis ?Continue allergen avoidance measures directed toward pollens, pets, molds, and cockroach as listed below ?Continue allergen immunotherapy once you are feeling back to baseline (next week) ?Continue an antihistamine once a day as needed for runny nose or itch ?Continue Atrovent 2 sprays in each nostril up to twice a day as needed for runny nose ?Continue Flonase 2 sprays in each nostril once a day as needed for stuffy nose. In the right nostril, point the applicator out toward the right ear. In the left nostril, point the applicator out toward the left ear ?Consider saline nasal rinses as needed for nasal symptoms. Use this before any medicated nasal sprays for best result ? ?Reflux ?Continue dietary and lifestyle modifications as listed below ?Continue omeprazole 40 mg once a day to control reflux ? ?Food allergy ?Continue to avoid peanuts, milk, and soy.  In case of an allergic reaction, take Benadryl 50 mg capsules every 4 hours, and if life-threatening symptoms occur, inject with EpiPen 0.3 mg. ? ? ?Call the clinic if this treatment plan is not working well for you. ? ?Follow up in 2 months or sooner if needed. ? ?Reducing Pollen Exposure ?The American Academy of Allergy, Asthma and Immunology suggests the following steps to reduce your exposure to pollen during allergy seasons. ?Do not hang sheets or clothing out to dry; pollen may collect on these items. ?Do not mow lawns or spend time around  freshly cut grass; mowing stirs up pollen. ?Keep windows closed at night.  Keep car windows closed while driving. ?Minimize morning activities outdoors, a time when pollen counts are usually at their highest. ?Stay indoors as much as possible when pollen counts or humidity is high and on windy days when pollen tends to remain in the air longer. ?Use air conditioning when possible.  Many air conditioners have filters that trap the pollen spores. ?Use a HEPA room air filter to remove pollen form the indoor air you breathe. ? ?Control of Dog or Cat Allergen ?Avoidance is the best way to manage a dog or cat allergy. If you have a dog or cat and are allergic to dog or cats, consider removing the dog or cat from the home. ?If you have a dog or cat but don?t want to find it a new home, or if your family wants a pet even though someone in the household is allergic, here are some strategies that may help keep symptoms at bay: ? ?Keep the pet out of your bedroom and restrict it to only a few rooms. Be advised that keeping the dog or cat in only one room will not limit the allergens to that room. ?Don?t pet, hug or kiss the dog or cat; if you do, wash your hands with soap and water. ?High-efficiency particulate air (HEPA) cleaners run continuously in a bedroom or living room can reduce allergen levels over time. ?Regular use of a high-efficiency vacuum cleaner or a central vacuum can reduce allergen levels. ?Giving your  dog or cat a bath at least once a week can reduce airborne allergen. ? ?Control of Mold Allergen ?Mold and fungi can grow on a variety of surfaces provided certain temperature and moisture conditions exist.  Outdoor molds grow on plants, decaying vegetation and soil.  The major outdoor mold, Alternaria and Cladosporium, are found in very high numbers during hot and dry conditions.  Generally, a late Summer - Fall peak is seen for common outdoor fungal spores.  Rain will temporarily lower outdoor mold spore  count, but counts rise rapidly when the rainy period ends.  The most important indoor molds are Aspergillus and Penicillium.  Dark, humid and poorly ventilated basements are ideal sites for mold growth.  The next most common sites of mold growth are the bathroom and the kitchen. ? ?Outdoor [employer] ?Use air conditioning and keep windows closed ?Avoid exposure to decaying vegetation. ?Avoid leaf raking. ?Avoid grain handling. ?Consider wearing a face mask if working in moldy areas. ? ?Indoor Mold Control ?Maintain humidity below 50%. ?Clean washable surfaces with 5% bleach solution. ?Remove sources e.g. Contaminated carpets. ? ?Control of Cockroach Allergen ?Cockroach allergen has been identified as an important cause of acute attacks of asthma, especially in urban settings.  There are fifty-five species of cockroach that exist in the [place], however only three, the [place], [place] species produce allergen that can affect patients with Asthma.  Allergens can be obtained from fecal particles, egg casings and secretions from cockroaches. ?   ?Remove food sources. ?Reduce access to water. ?Seal access and entry points. ?Spray runways with 0.5-1% Diazinon or Chlorpyrifos ?Blow boric acid power under stoves and refrigerator. ?Place bait stations (hydramethylnon) at feeding sites. ? ? ?

## 2022-03-28 NOTE — Telephone Encounter (Signed)
Can you please set her up with a televisit on my schedule today? She was last seen in the clinic on 09/17/2021. Thank you

## 2022-03-28 NOTE — Progress Notes (Signed)
? ?RE: Kimberly Lam MRN: [id number] DOB: 11-23-1946 ?Date of Telemedicine Visit: 03/28/2022 ? ?Referring provider: [name], NP ?Primary care provider: [name], NP ? ?Chief Complaint: Asthma (No issues. ) and Sinus Problem (Head, left eye, sore and tender. Says she has taken Mucinex since Wednesday as well as sudafed and nasal rinses. Says she is suffering in pain and wants pain relief. ) ? ? ?Telemedicine Follow Up Visit via Telephone: ?I connected with Kimberly Lam for a follow up on 03/28/22 by telephone and verified that I am speaking with the correct person using two identifiers. ?  ?I discussed the limitations, risks, security and privacy concerns of performing an evaluation and management service by telephone and the availability of in person appointments. I also discussed with the patient that there may be a patient responsible charge related to this service. The patient expressed understanding and agreed to proceed. ? ?Patient is at home   ?Provider is at the office.  ?Visit start time: 35 ?Visit end time: 330 ?Insurance consent/check in by: Karena Addison ?Medical consent and medical assistant/nurse: Cree ? ?History of Present Illness: ?She is a 76 y.o. female, who is being followed for asthma, allergic rhinitis, reflux, and food allergy to peanut, milk, and soy. Her previous allergy office visit was on 09/17/2021 with Dr. Maudie Mercury.  At today's visit, she reports that she began to experience sinus pressure and pain, especially around her left eye, about 1 week ago.  She reports in the interim that the pain has intensified and has now moved down to her left upper jaw.  She reports additional symptoms including nasal drainage that looks like " oatmeal", nasal congestion, and thick chunky postnasal drainage.  She continues Flonase daily, ipratropium nasal spray daily, azelastine daily, and nasal rinses.  She denies fever, sweats, chills, or sick contacts.  She had been using nasal rinses daily, however,  over the last week has increased nasal saline rinses to 3 times a day.  She does report occasional blood streaks on the tissue coming from the left nostril.  She denies any dripping bleeding.  She reports these blood streaks resolve after nasal saline rinses.  Asthma is reported as well controlled with no shortness of breath, cough, or wheeze.  She continues montelukast 10 mg once a day and has not needed to use her albuterol since her last visit to this clinic.  Other than her current sinus status, allergic rhinitis is reported as moderately well controlled with her current medical regimen.  She continues allergen immunotherapy with no large or local reactions.  She reports a significant decrease in her symptoms of allergic rhinitis while continuing on allergen immunotherapy.  She reports that she has not taken an antibiotic for sinusitis in many years.  Reflux is reported as well controlled with no symptoms including heartburn or vomiting.  She is not currently taking omeprazole and reports that she takes this only as needed which is infrequently.  She continues to avoid peanut, milk, and soy with no accidental ingestion or EpiPen use since her last visit to this clinic.  Her current medications are listed in the chart. ? ?Assessment and Plan: ?Kimberly Lam is a 76 y.o. female with: ?Patient Instructions  ?Acute sinusitis ?Begin Augmentin 875 mg 1 tablet twice a day for the next 10 days for acute sinusitis ?Continue nasal saline rinses followed by Flonase nasal spray ?Restart Mucinex 600 to 1200 mg twice a day and increase fluid intake to thin nasal secretions ? ?Asthma ?Continue montelukast 10  mg once a day to prevent cough or wheeze ?Continue albuterol 2 puffs once every 4 hours as needed for cough or wheeze ?You may use albuterol 2 puffs 5- 15 minutes before activity to decrease cough or wheeze ? ?Allergic rhinitis ?Continue allergen avoidance measures directed toward pollens, pets, molds, and cockroach as listed  below ?Continue allergen immunotherapy once you are feeling back to baseline ?Continue an antihistamine once a day as needed for runny nose or itch ?Continue Atrovent 2 sprays in each nostril up to twice a day as needed for runny nose ?Continue Flonase 2 sprays in each nostril once a day as needed for stuffy nose. In the right nostril, point the applicator out toward the right ear. In the left nostril, point the applicator out toward the left ear ?Consider saline nasal rinses as needed for nasal symptoms. Use this before any medicated nasal sprays for best result ? ?Reflux ?Continue dietary and lifestyle modifications as listed below ?Continue omeprazole 40 mg once a day to control reflux ? ?Food allergy ?Continue to avoid peanuts, milk, and soy.  In case of an allergic reaction, take Benadryl 50 mg capsules every 4 hours, and if life-threatening symptoms occur, inject with EpiPen 0.3 mg. ? ? ?Call the clinic if this treatment plan is not working well for you. ? ?Follow up in 2 months or sooner if needed. ? ?  ?Return in about 2 months (around 05/28/2022), or if symptoms worsen or fail to improve. ? ?Meds ordered this encounter  ?Medications  ? DISCONTD: amoxicillin-clavulanate (AUGMENTIN) 875-125 MG tablet  ?  Sig: Take 1 tablet by mouth 2 (two) times daily for 10 days.  ?  Dispense:  20 tablet  ?  Refill:  0  ? amoxicillin-clavulanate (AUGMENTIN) 875-125 MG tablet  ?  Sig: Take 1 tablet by mouth 2 (two) times daily for 10 days.  ?  Dispense:  20 tablet  ?  Refill:  0  ? ? ?Medication List:  ?Current Outpatient Medications  ?Medication Sig Dispense Refill  ? Accu-Chek FastClix Lancets MISC Use to check blood glucose BID 102 each 2  ? Alcohol Swabs (B-D SINGLE USE SWABS REGULAR) PADS USE TO TEST BLOOD GLUCOSE FOUR TIMES DAILY 200 each 3  ? alendronate (FOSAMAX) 70 MG tablet TAKE 1 TABLET BY MOUTH  WEEKLY WITH 8 OZ OF PLAIN  WATER 30 MINUTES BEFORE  FIRST FOOD, DRINK OR MEDS.  STAY UPRIGHT FOR 30 MINS 12 tablet 3  ?  amoxicillin-clavulanate (AUGMENTIN) 875-125 MG tablet Take 1 tablet by mouth 2 (two) times daily for 10 days. 20 tablet 0  ? Ascorbic Acid (VITAMIN C) 1000 MG tablet Take 1,000 mg by mouth as needed.    ? Barberry-Oreg Grape-Goldenseal (BERBERINE COMPLEX PO) Take by mouth daily.    ? Blood Glucose Monitoring Suppl (ACCU-CHEK GUIDE) w/Device KIT 1 kit by Does not apply route as directed. 1 kit 0  ? Calcium Carbonate-Vitamin D (CALCIUM 600+D PO) Take by mouth.    ? CINNAMON PO Take 1 tablet by mouth as needed.    ? Cranberry 450 MG CAPS Take 1 capsule by mouth as needed.    ? Cyanocobalamin (VITAMIN B-12 CR) 1500 MCG TBCR Take 1 tablet by mouth as needed.    ? Echinacea-Goldenseal (ECHINACEA COMB/GOLDEN SEAL PO) Take 1 tablet by mouth as needed. Reported on 12/16/2015    ? EPINEPHrine 0.3 mg/0.3 mL IJ SOAJ injection Inject 0.3 mg into the muscle as needed for anaphylaxis.    ? EPINEPHrine  0.3 mg/0.3 mL IJ SOAJ injection Inject 0.3 mg into the muscle as needed for anaphylaxis. 3 each 1  ? gabapentin (NEURONTIN) 300 MG capsule TAKE 2 CAPSULES BY MOUTH 3  TIMES DAILY 540 capsule 1  ? glipiZIDE (GLUCOTROL XL) 5 MG 24 hr tablet Take 1 tablet (5 mg total) by mouth daily with breakfast. 90 tablet 3  ? glucose blood (ACCU-CHEK GUIDE) test strip Use as instructed 100 each 12  ? Green Tea, Camillia sinensis, (GREEN TEA PO) Take by mouth as needed. Reported on 12/16/2015    ? Lactobacillus (ACIDOPHILUS PROBIOTIC PO) Take 1 tablet by mouth daily.    ? Lancet Devices (ONETOUCH DELICA PLUS LANCING) MISC USE AS DIRECTED 1 each 1  ? Lancets Misc. (ACCU-CHEK FASTCLIX LANCET) KIT USE AS DIRECTED 1 kit 0  ? levocetirizine (XYZAL) 5 MG tablet TAKE 1 TABLET EVERY EVENING 90 tablet 1  ? MELATONIN ER PO Take by mouth.    ? metFORMIN (GLUCOPHAGE-XR) 500 MG 24 hr tablet Take 2 tablets (1,000 mg total) by mouth 2 (two) times daily. 360 tablet 3  ? montelukast (SINGULAIR) 10 MG tablet TAKE 1 TABLET AT BEDTIME 90 tablet 1  ? Omega-3 Fatty Acids  (OMEGA 3 PO) Take 1 tablet by mouth as needed.    ? omeprazole (PRILOSEC) 40 MG capsule Take 40 mg by mouth daily.    ? ONETOUCH VERIO test strip USE TO TEST BLOOD GLUCOSE 4 TIMES DAILY 400 strip 3  ? Polyethy

## 2022-03-29 ENCOUNTER — Ambulatory Visit (INDEPENDENT_AMBULATORY_CARE_PROVIDER_SITE_OTHER): Payer: Medicare HMO

## 2022-03-29 DIAGNOSIS — J309 Allergic rhinitis, unspecified: Secondary | ICD-10-CM | POA: Diagnosis not present

## 2022-03-30 ENCOUNTER — Encounter: Payer: Self-pay | Admitting: Family Medicine

## 2022-03-30 ENCOUNTER — Other Ambulatory Visit: Payer: Self-pay | Admitting: *Deleted

## 2022-03-30 MED ORDER — PREDNISONE 10 MG PO TABS: ORAL_TABLET | ORAL | 0 refills | Status: DC

## 2022-03-30 NOTE — Telephone Encounter (Signed)
Please let this patient know that the antibiotic should be providing some relief over the next few days if she has a bacterial infection. Lets add on prednisone 10 mg tablets 2 tablets once a day for the next 3 days in addition to everything she is currently using. Please remind her not to get an allergy injection in the future when she is sick. Thank you

## 2022-04-05 ENCOUNTER — Encounter: Payer: Self-pay | Admitting: Family Medicine

## 2022-04-05 ENCOUNTER — Telehealth: Payer: Self-pay | Admitting: *Deleted

## 2022-04-05 NOTE — Chronic Care Management (AMB) (Signed)
?  Care Management  ? ?Outreach Note ? ?04/05/2022 ?Name: Kimberly Lam MRN: [id number] DOB: 02-Dec-1946 ? ? ?Reason for referral : Care Coordination (Outreach to schedule initial call with [facility] ) ? ? ?An unsuccessful telephone outreach was attempted today. The patient was referred to the case management team for assistance with care management and care coordination.  ? ?Follow Up Plan:  ?A HIPAA compliant phone message was left for the patient providing contact information and requesting a return call.  ?The care management team will reach out to the patient again over the next 7 days.  ?If patient returns call to provider office, please advise to call Mobile * at [phone].* ? ?Laverda Sorenson  ?Care Guide, Embedded Care Coordination ?[place]  Care Management  ?Direct Dial: [phone] ? ?

## 2022-04-05 NOTE — Chronic Care Management (AMB) (Signed)
?  Care Management  ? ?Note ? ?04/05/2022 ?Name: Kimberly Lam MRN: [id number] DOB: 27-Mar-1946 ? ?Kimberly Lam is a 76 y.o. year old female who is a primary care patient of [name], NP. I reached out to Kimberly Lam by phone today offer care coordination services.  ? ?Kimberly Lam was given information about care management services today including:  ?Care management services include personalized support from designated clinical staff supervised by her physician, including individualized plan of care and coordination with other care providers ?24/7 contact phone numbers for assistance for urgent and routine care needs. ?The patient may stop care management services at any time by phone call to the office staff. ? ?Patient agreed to services and verbal consent obtained.  ? ?Follow up plan: ?Telephone appointment with care management team member scheduled for:04/08/22 ? ?Kimberly Lam  ?Care Guide, Embedded Care Coordination ?[place]  Care Management  ?Direct Dial: [phone] ? ?

## 2022-04-06 ENCOUNTER — Ambulatory Visit (INDEPENDENT_AMBULATORY_CARE_PROVIDER_SITE_OTHER): Payer: Medicare HMO

## 2022-04-06 DIAGNOSIS — J309 Allergic rhinitis, unspecified: Secondary | ICD-10-CM

## 2022-04-08 ENCOUNTER — Ambulatory Visit: Payer: Medicare HMO

## 2022-04-08 DIAGNOSIS — E1169 Type 2 diabetes mellitus with other specified complication: Secondary | ICD-10-CM

## 2022-04-08 DIAGNOSIS — E119 Type 2 diabetes mellitus without complications: Secondary | ICD-10-CM

## 2022-04-08 NOTE — Patient Instructions (Signed)
Visit Information ? ?Thank you for taking time to visit with me today. Please don't hesitate to contact me if I can be of assistance to you before our next scheduled telephone appointment. ? ?Following are the goals we discussed today:  ?Take all medications as prescribed ?Attend all scheduled provider appointments ?Call pharmacy for medication refills 3-7 days in advance of running out of medications ?Perform all self care activities independently  ?Call provider office for new concerns or questions  ?keep appointment with eye doctor ?check blood sugar at prescribed times: three times daily and when you have symptoms of low or high blood sugar ?check feet daily for cuts, sores or redness ?take the blood sugar log to all doctor visits ?drink 6 to 8 glasses of water each day ?fill half of plate with vegetables ?limit fast food meals to no more than 1 per week ?manage portion size ?switch to sugar-free drinks ?call for medicine refill 2 or 3 days before it runs out ?take all medications exactly as prescribed ?call doctor with any symptoms you believe are related to your medicine ?Hypoglycemia ?Hypoglycemia is when the sugar (glucose) level in your blood is too low. Low blood sugar can happen to people who have diabetes and people who do not have diabetes. Low blood sugar can happen quickly, and it can be an emergency. ?What are the causes? ?This condition happens most often in people who have diabetes. It may be caused by: ?Diabetes medicine. ?Not eating enough, or not eating often enough. ?Doing more physical activity. ?Drinking alcohol on an empty stomach. ?If you do not have diabetes, this condition may be caused by: ?A tumor in the pancreas. ?Not eating enough, or not eating for long periods at a time (fasting). ?A very bad infection or illness. ?Problems after having weight loss (bariatric) surgery. ?Kidney failure or liver failure. ?Certain medicines. ?What increases the risk? ?This condition is more likely to  develop in people who: ?Have diabetes and take medicines to lower their blood sugar. ?Abuse alcohol. ?Have a very bad illness. ?What are the signs or symptoms? ?Mild ?Hunger. ?Sweating and feeling clammy. ?Feeling dizzy or light-headed. ?Being sleepy or having trouble sleeping. ?Feeling like you may vomit (nauseous). ?A fast heartbeat. ?A headache. ?Blurry vision. ?Mood changes, such as: ?Being grouchy. ?Feeling worried or nervous (anxious). ?Tingling or loss of feeling (numbness) around your mouth, lips, or tongue. ?Moderate ?Confusion and poor judgment. ?Behavior changes. ?Weakness. ?Uneven heartbeat. ?Trouble with moving (coordination). ?Very low ?Very low blood sugar (severe hypoglycemia) is a medical emergency. It can cause: ?Fainting. ?Seizures. ?Loss of consciousness (coma). ?Death. ?How is this treated? ?Treating low blood sugar ?Low blood sugar is often treated by eating or drinking something that has sugar in it right away. The food or drink should contain 15 grams of a fast-acting carb (carbohydrate). Options include: ?4 oz (120 mL) of fruit juice. ?4 oz (120 mL) of regular soda (not diet soda). ?A few pieces of hard candy. Check food labels to see how many pieces to eat for 15 grams. ?1 Tbsp (15 mL) of sugar or honey. ?4 glucose tablets. ?1 tube of glucose gel. ?Treating low blood sugar if you have diabetes ?If you can think clearly and swallow safely, follow the 15:15 rule: ?Take 15 grams of a fast-acting carb. Talk with your doctor about how much you should take. ?Always keep a source of fast-acting carb with you, such as: ?Glucose tablets (take 4 tablets). ?A few pieces of hard candy. Check food labels  to see how many pieces to eat for 15 grams. ?4 oz (120 mL) of fruit juice. ?4 oz (120 mL) of regular soda (not diet soda). ?1 Tbsp (15 mL) of honey or sugar. ?1 tube of glucose gel. ?Check your blood sugar 15 minutes after you take the carb. ?If your blood sugar is still at or below 70 mg/dL (3.9  mmol/L), take 15 grams of a carb again. ?If your blood sugar does not go above 70 mg/dL (3.9 mmol/L) after 3 tries, get help right away. ?After your blood sugar goes back to normal, eat a meal or a snack within 1 hour. ? ?Treating very low blood sugar ?If your blood sugar is below 54 mg/dL (3 mmol/L), you have very low blood sugar, or severe hypoglycemia. This is an emergency. Get medical help right away. ?If you have very low blood sugar and you cannot eat or drink, you will need to be given a hormone called glucagon. A family member or friend should learn how to check your blood sugar and how to give you glucagon. Ask your doctor if you need to have an emergency glucagon kit at home. ?Very low blood sugar may also need to be treated in a hospital. ?Follow these instructions at home: ?General instructions ?Take over-the-counter and prescription medicines only as told by your doctor. ?Stay aware of your blood sugar as told by your doctor. ?If you drink alcohol: ?Limit how much you have to: ?0-1 drink a day for women who are not pregnant. ?0-2 drinks a day for men. ?Know how much alcohol is in your drink. In the [place] one drink equals one 12 oz bottle of beer (355 mL), one 5 oz glass of wine (148 mL), or one 1? oz glass of hard liquor (44 mL). ?Be sure to eat food when you drink alcohol. ?Know that your body absorbs alcohol quickly. This may lead to low blood sugar later. Be sure to keep checking your blood sugar. ?Keep all follow-up visits. ?If you have diabetes: ? ?Always have a fast-acting carb (15 grams) with you to treat low blood sugar. ?Follow your diabetes care plan as told by your doctor. Make sure you: ?Know the symptoms of low blood sugar. ?Check your blood sugar as often as told. Always check it before and after exercise. ?Always check your blood sugar before you drive. ?Take your medicines as told. ?Follow your meal plan. ?Eat on time. Do not skip meals. ?Share your diabetes care plan with: ?Your work or  school. ?People you live with. ?Carry a card or wear jewelry that says you have diabetes. ?Where to find more information ?American Diabetes Association: www.diabetes.org ?Contact a doctor if: ?You have trouble keeping your blood sugar in your target range. ?You have low blood sugar often. ?Get help right away if: ?You still have symptoms after you eat or drink something that contains 15 grams of fast-acting carb, and you cannot get your blood sugar above 70 mg/dL by following the 15:15 rule. ?Your blood sugar is below 54 mg/dL (3 mmol/L). ?You have a seizure. ?You faint. ?These symptoms may be an emergency. Get help right away. Call your local emergency services (911 in the [place] ?Do not wait to see if the symptoms will go away. ?Do not drive yourself to the hospital. ?Summary ?Hypoglycemia happens when the level of sugar (glucose) in your blood is too low. ?Low blood sugar can happen to people who have diabetes and people who do not have diabetes. Low blood sugar  can happen quickly, and it can be an emergency. ?Make sure you know the symptoms of low blood sugar and know how to treat it. ?Always keep a source of sugar (fast-acting carb) with you to treat low blood sugar. ?This information is not intended to replace advice given to you by your health care provider. Make sure you discuss any questions you have with your health care provider. ?Document Revised: 11/05/2020 Document Reviewed: 11/05/2020 ?Elsevier Patient Education ? [place]. ? ?Our next appointment is by telephone on 05/27/22 at 2:15 PM ? ?Please call the care guide team at [phone] if you need to cancel or reschedule your appointment.  ? ?If you are experiencing a Mental Health or [place] or need someone to talk to, please call the Suicide and Crisis Lifeline: 988 ?call the [place] National Suicide Prevention Lifeline: (720) 796-7723 or TTY: 4790811053 TTY 847-064-5451) to talk to a trained counselor ?call 1-800-273-TALK  (toll free, 24 hour hotline) ?go to [facility] [address], [place] [phone]) ?call 911  ? ?Following is a copy of your full plan of care:  ?Care Plan : Charity fundraiser

## 2022-04-08 NOTE — Chronic Care Management (AMB) (Signed)
? Care Management ?  ? RN Visit Note ? ?04/08/2022 ?Name: Kimberly Lam MRN: [id number] DOB: 1946-10-04 ? ?Subjective: ?Kimberly Lam is a 76 y.o. year old female who is a primary care patient of Kimberly Lam, Kimberly Rumps, NP. The care management team was consulted for assistance with disease management and care coordination needs.   ? ?Engaged with patient by telephone for initial visit in response to provider referral for case management and/or care coordination services.  ? ?Consent to Services:  ? Kimberly Lam was given information about Care Management services today including:  ?Care Management services includes personalized support from designated clinical staff supervised by her physician, including individualized plan of care and coordination with other care providers ?24/7 contact phone numbers for assistance for urgent and routine care needs. ?The patient may stop case management services at any time by phone call to the office staff. ? ?Patient agreed to services and consent obtained.  ? ?Assessment: Review of patient past medical history, allergies, medications, health status, including review of consultants reports, laboratory and other test data, was performed as part of comprehensive evaluation and provision of chronic care management services.  ? ?SDOH (Social Determinants of Health) assessments and interventions performed:  ?SDOH Interventions   ? ?Flowsheet Row Most Recent Value  ?SDOH Interventions   ?Food Insecurity Interventions Intervention Not Indicated  ?Financial Strain Interventions Intervention Not Indicated  ?Housing Interventions Intervention Not Indicated  ?Physical Activity Interventions Intervention Not Indicated  [reviewed insurance fitness benefit]  ?Stress Interventions Intervention Not Indicated  [declines LCSW referral]  ?Transportation Interventions Intervention Not Indicated  ? ?  ?  ? ?Care Plan ? ?Allergies  ?Allergen Reactions  ? Peanut-Containing Drug Products Anaphylaxis  ?  Eggs Or Egg-Derived Products Nausea Only  ?  Nausea only- can eat cakes with eggs   ? Milk-Related Compounds Nausea And Vomiting  ?  Milk and cheese  ? Actos [Pioglitazone] Nausea Only  ? Soy Allergy Nausea Only  ?  Pt states had allergy testing and was told allergic to soy-causes nausea but no other reactions  ? Vicodin [Hydrocodone-Acetaminophen] Nausea Only  ? ? ?Outpatient Encounter Medications as of 04/08/2022  ?Medication Sig Note  ? Accu-Chek FastClix Lancets MISC Use to check blood glucose BID   ? Alcohol Swabs (B-D SINGLE USE SWABS REGULAR) PADS USE TO TEST BLOOD GLUCOSE FOUR TIMES DAILY   ? alendronate (FOSAMAX) 70 MG tablet TAKE 1 TABLET BY MOUTH  WEEKLY WITH 8 OZ OF PLAIN  WATER 30 MINUTES BEFORE  FIRST FOOD, DRINK OR MEDS.  STAY UPRIGHT FOR 30 MINS   ? Ascorbic Acid (VITAMIN C) 1000 MG tablet Take 1,000 mg by mouth as needed. 04/11/2019: Patient states that she takes it daily   ? Barberry-Oreg Grape-Goldenseal (BERBERINE COMPLEX PO) Take by mouth daily.   ? Blood Glucose Monitoring Suppl (ACCU-CHEK GUIDE) w/Device KIT 1 kit by Does not apply route as directed.   ? Calcium Carbonate-Vitamin D (CALCIUM 600+D PO) Take by mouth.   ? CINNAMON PO Take 1 tablet by mouth as needed.   ? Cranberry 450 MG CAPS Take 1 capsule by mouth as needed. 04/11/2019: Patient takes it daily   ? Cyanocobalamin (VITAMIN B-12 CR) 1500 MCG TBCR Take 1 tablet by mouth as needed. 04/11/2019: Patient takes once daily   ? Echinacea-Goldenseal (ECHINACEA COMB/GOLDEN SEAL PO) Take 1 tablet by mouth as needed. Reported on 12/16/2015 03/14/2019: PRN   ? EPINEPHrine 0.3 mg/0.3 mL IJ SOAJ injection Inject 0.3 mg into the muscle  as needed for anaphylaxis.   ? EPINEPHrine 0.3 mg/0.3 mL IJ SOAJ injection Inject 0.3 mg into the muscle as needed for anaphylaxis.   ? gabapentin (NEURONTIN) 300 MG capsule TAKE 2 CAPSULES BY MOUTH 3  TIMES DAILY   ? glipiZIDE (GLUCOTROL XL) 5 MG 24 hr tablet Take 1 tablet (5 mg total) by mouth daily with breakfast.   ?  glucose blood (ACCU-CHEK GUIDE) test strip Use as instructed   ? Green Tea, Camillia sinensis, (GREEN TEA PO) Take by mouth as needed. Reported on 12/16/2015 03/14/2019: PRN  ? Lactobacillus (ACIDOPHILUS PROBIOTIC PO) Take 1 tablet by mouth daily.   ? Lancet Devices (ONETOUCH DELICA PLUS LANCING) MISC USE AS DIRECTED   ? Lancets Misc. (ACCU-CHEK FASTCLIX LANCET) KIT USE AS DIRECTED   ? levocetirizine (XYZAL) 5 MG tablet TAKE 1 TABLET EVERY EVENING   ? MELATONIN ER PO Take by mouth.   ? metFORMIN (GLUCOPHAGE-XR) 500 MG 24 hr tablet Take 2 tablets (1,000 mg total) by mouth 2 (two) times daily.   ? montelukast (SINGULAIR) 10 MG tablet TAKE 1 TABLET AT BEDTIME   ? Omega-3 Fatty Acids (OMEGA 3 PO) Take 1 tablet by mouth as needed.   ? omeprazole (PRILOSEC) 40 MG capsule Take 40 mg by mouth daily.   ? ONETOUCH VERIO test strip USE TO TEST BLOOD GLUCOSE 4 TIMES DAILY   ? Polyethyl Glycol-Propyl Glycol (SYSTANE FREE OP) Apply 1 drop to eye daily. Uses thera work for dr eyes per pt   ? POTASSIUM PO Take by mouth.   ? predniSONE (DELTASONE) 10 MG tablet Take 2 tablets once a day for the next 3 days.   ? ramipril (ALTACE) 2.5 MG capsule TAKE 1 CAPSULE BY MOUTH  DAILY   ? simvastatin (ZOCOR) 20 MG tablet Take 1 tablet (20 mg total) by mouth daily.   ? Spacer/Aero-Holding Chambers (AEROCHAMBER PLUS) inhaler Use as instructed with MDI   ? Stinging Nettle 2 % POWD Take 1 tablet by mouth as needed. Reported on 12/16/2015   ? traZODone (DESYREL) 50 MG tablet Take 0.5-1 tablets (25-50 mg total) by mouth at bedtime as needed. for sleep   ? triamcinolone (NASACORT) 55 MCG/ACT AERO nasal inhaler Place 2 sprays into the nose daily. For nasal congestion.   ? TURMERIC PO Take 800 mg by mouth as needed. Reported on 12/16/2015 04/11/2019: Patient takes dialy  ? VENTOLIN HFA 108 (90 Base) MCG/ACT inhaler Inhale 2 puffs into the lungs every 6 (six) hours as needed for wheezing or shortness of breath.   ? [DISCONTINUED] Azelastine HCl 0.15 % SOLN  Use 1-2 sprays per nostril twice a day as needed for sinus drainage.   ? [DISCONTINUED] fluticasone (FLONASE) 50 MCG/ACT nasal spray Place 2 sprays into both nostrils daily.   ? [DISCONTINUED] ipratropium (ATROVENT) 0.06 % nasal spray INSTILL 2 SPRAYS IN BOTH  NOSTRILS 3 TIMES DAILY AS  NEEDED FOR DRAINAGE   ? ?No facility-administered encounter medications on file as of 04/08/2022.  ? ? ?Patient Active Problem List  ? Diagnosis Date Noted  ? Chronic bilateral low back pain   ? Right hip pain 04/06/2020  ? Peripheral neuropathy 07/10/2019  ? 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 Right lumbar radiculopathy 01/23/2018  ? Gastroesophageal reflux disease 09/14/2017  ? Diabetes mellitus without complication ([place]) 71/05/2693  ? 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 Acute bacterial sinusitis 09/12/2016  ? Moderate persistent asthma, uncomplicated 85/46/2703  ? Perennial and seasonal allergic rhinoconjunctivitis  12/16/2015  ? Lumbar radiculopathy 11/25/2015  ? ? 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 ?Care Plan : RN Care Manager Plan of Care  ?Updates made by Dimitri Ped, RN since 04/08/2022 12:00 AM  ?  ? ?Problem: Disease Management and Care Coordination Needs(DM, HLD)   ?Priority: High  ?  ? ?Long-Range Goal: Establish Plan of Care for Care Coordination and Disease Management Needs   ?Start Date: 04/08/2022  ?Expected End Date: 04/08/2023  ?Priority: High  ?Note:   ?Current Barriers:  ?Knowledge Deficits related to plan of care for management of HLD and DMII  ?Chronic Disease Management support and education needs related to HLD and DMII  ?Pt transferred from [employer] coach.   ?States that her CBGs went up when she had a sinus infection and was on prednisone.  States she is now on glipizide since her A1C went up to 7.5%.  States her CBGs range from 93-101 fasting and 123-245 post prandial.  States she has had one low reading to 63 in the morning.  States  she tries to eat healthy and likes vegetables.  States she is walking 1-3 miles 4-5 times a week ? ?RNCM Clinical Goal(s):  ?Patient will verbalize understanding of plan for management of HLD and DM

## 2022-04-11 ENCOUNTER — Ambulatory Visit (INDEPENDENT_AMBULATORY_CARE_PROVIDER_SITE_OTHER): Payer: Medicare HMO

## 2022-04-11 ENCOUNTER — Telehealth: Payer: Self-pay | Admitting: Family Medicine

## 2022-04-11 DIAGNOSIS — J309 Allergic rhinitis, unspecified: Secondary | ICD-10-CM

## 2022-04-11 NOTE — Telephone Encounter (Signed)
Please have her continue nasal saline rinses followed by a nasal steroid spray.  In the right nostril, point the applicator out toward the right ear. In the left nostril, point the applicator out toward the left ear. Please have her continue to increase her fluid intake and continue Mucinex [phone] mg twice a day. She can use Azelastine 2 sprays in each nostril up to twice a day which may help with sinus headache. Please have her call the clinic with any worsening symptoms or if she develops a fever. Thank you

## 2022-04-11 NOTE — Telephone Encounter (Signed)
Kimberly Lam came in to the office and states she has finished the medication and has been off of it for 3 days now and is still having pain in her face and head but it is not as severe, and the mucus has cleared up a lot and it is not like it was.  She is taking the mucinex as directed.  She states no fever, sugar is starting to settle and just wants to know is there anything else she can do??    ?

## 2022-04-12 NOTE — Telephone Encounter (Signed)
Spoke to "[place]" and I explained to her the instructions [name] wants her to do, she understood everything and will call if it gets worse or she gets a fever.  ? ?Roxy Manns [phone] ?

## 2022-04-13 NOTE — Telephone Encounter (Signed)
Let's have her try azelastine 2 sprays in each nostril twice a day and see if that will ease her pain. Please ask her if she has a fever or discolored mucus. Thank you

## 2022-04-15 MED ORDER — AZELASTINE HCL 0.1 % NA SOLN: 2.0000 | Freq: Two times a day (BID) | NASAL | 1 refills | Status: DC

## 2022-04-19 NOTE — Telephone Encounter (Signed)
Patient is improving and would like to let [name] know.  ?

## 2022-04-19 NOTE — Telephone Encounter (Signed)
So good to hear. Thank you!!

## 2022-04-20 ENCOUNTER — Ambulatory Visit (INDEPENDENT_AMBULATORY_CARE_PROVIDER_SITE_OTHER): Payer: Medicare HMO

## 2022-04-20 DIAGNOSIS — J309 Allergic rhinitis, unspecified: Secondary | ICD-10-CM

## 2022-04-20 NOTE — Progress Notes (Deleted)
PATIENT: Kimberly Lam DOB: 1946-07-11  REASON FOR VISIT: follow up chronic low back pain HISTORY FROM: patient PRIMARY NEUROLOGIST: Dr. Krista Blue   HISTORY Kimberly Lam is a 76 years old female, seen in refer by her primary care nurse practitioner [name] for evaluation of leg cramping, tremors, initial evaluation was on January 23, 2018.     She has past medical history of hypertension, diabetes, hyperlipidemia   I saw her initially in 2016 for evaluation of low back pain, radiating pain to bilateral hip, bilateral leg muscle cramping, and weakness.   She has history of low back pain since 2006, midline low back, occasionally go to posterior thigh, go down below her knee, worsening with prolonged standing, or walking,   Previous her low back pain has responded very well to epidural injection, but the benefit gradually fades away, over the years, she complains of worsening low back pain, radiating pain to bilateral lower extremity again, in addition, she complains of bilateral feet paresthesia,    She had a history of diabetes, she denies gait difficulty, no bowel and bladder incontinence, she denies bilateral upper extremity paresthesia or weakness.    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     Note from Dr. Franciso Bend, [facility], [place] in 2017, she was diagnosed with degenerative spondylolisthesis L4-L5, L5-S1, neurogenic claudication, low back pain, received epidural injection in Feb 2015, benefit lasted about one year   MRI of lumbar spine December 2016, There is a transitional L6/S1 vertebral body. 8 mm of anterolisthesis of L5 upon L6/S1 of a degenerative nature due to severe facet hypertrophy. At this level, there is mild spinal stenosis and moderately severe right foraminal narrowing. There could be  compression of the right L5 nerve root.  Degenerative changes at L6/S1-S2 lead to severe right foraminal narrowing that could lead to compression of the exiting right S1 nerve root   She continue complains of chronic low back pain, today she also complains of new onset right hand shaking, that was intermittent, getting worse when she holding utensils, writing, mild involvement on the left side, she continue complains of frequent lower extremity cramping especially on the right side, difficulty to walk in the morning.  There was no family history of tremor   UPDATE Nov 5th 2019: She was given prescription of gabapentin for her bilateral feet paresthesia, 300 mg 4 times a day does help her lower extremity cramps, but complains of daytime sleepiness, but she often woke up in the middle of the night complains of left muscle cramping, difficulty going to sleep,   We again personally reviewed MRI of lumbar spine in December 2016, transitional L6-S1 vertebral body, 8 mm of anterolisthesis of L5 upon S1 with severe facet hypertrophy, mild spinal stenosis, moderate severe right foraminal narrowing, potential compression of right L5 nerve roots,   UPDATE July 10 2019: She is overall doing much better, taking gabapentin 300 mg 2 tablets before bedtime, open skip daytime dose, epidural injection few months ago has helped her as well, she exercise regularly, denies gait abnormality, denies bowel and bladder incontinence   Update November 21, 2019 SS: She overall continues to do very well.  Apparently, she rotates her gabapentin dosing, says she takes gabapentin 300 mg, 2 capsules at least twice a day, at most she may take 4 times a day.  She adjust the regimen depending on her leg cramps.  She also has some right hip pain.  She knows that her low back gets to bothering her, she will do stretching, and she is able to control it well.  She is going to relocate to [place], but may still come here if she can't find  another neurologist.    Update April 06, 2020 SS: Here today to discuss new problem of right hip pain.  Indicates since Friday, right hip/pelvic pain, with weightbearing and turning.  She denies fall or injury.  She denies pain down the legs, no numbness or weakness.  She denies any urinary symptoms.  She remains on gabapentin 600 mg twice a day for cramping in her legs.  Indicates this pain is not typical of her chronic low back pain, which is usually in the center of her back.  She presents today unaccompanied. She has not tried tylenol or ibuprofen.  Update April 15, 2021 SS: Here today alone, decided not to move to [place], will be going to [place] to take care of her son. Chronic low back pain is "fantastic", certain times it may bother her, does her exercises with good benefit. Right hip is only bothersome when lying on it. Currently taking 600 mg twice daily, will go to 3 times daily if pain worsens, will drop dose after few days. Is off Meloxicam. No falls. Tries to walk 1-2 miles 3-4 days a week. Pain doesn't radiate down both legs.   Had x-ray of the right hip and pelvis in April 2021, was negative, no acute fracture or dislocation, no significant arthritic changes.  Felt the right hip pain could be coming from the back.  Has previously had ESI, last was Jan 2020, would do again if became bothersome.   Update Apr 21, 2022 SS:   REVIEW OF SYSTEMS: Out of a complete 14 system review of symptoms, the patient complains only of the following symptoms, and all other reviewed systems are negative.  Hip pain  ALLERGIES: Allergies  Allergen Reactions   Peanut-Containing Drug Products Anaphylaxis   Eggs Or Egg-Derived Products Nausea Only    Nausea only- can eat cakes with eggs    Milk-Related Compounds Nausea And Vomiting    Milk and cheese   Actos [Pioglitazone] Nausea Only   Soy Allergy Nausea Only    Pt states had allergy testing and was told allergic to soy-causes nausea but no other  reactions   Vicodin [Hydrocodone-Acetaminophen] Nausea Only    HOME MEDICATIONS: Outpatient Medications Prior to Visit  Medication Sig Dispense Refill   Accu-Chek FastClix Lancets MISC Use to check blood glucose BID 102 each 2   Alcohol Swabs (B-D SINGLE USE SWABS REGULAR) PADS USE TO TEST BLOOD GLUCOSE FOUR TIMES DAILY 200 each 3   alendronate (FOSAMAX) 70 MG tablet TAKE 1 TABLET BY MOUTH  WEEKLY WITH 8 OZ OF PLAIN  WATER 30 MINUTES BEFORE  FIRST FOOD, DRINK OR MEDS.  STAY UPRIGHT FOR 30 MINS 12 tablet 3   Ascorbic Acid (VITAMIN C) 1000 MG tablet Take 1,000 mg by mouth as needed.     azelastine (ASTELIN) 0.1 % nasal spray Place 2 sprays into both nostrils 2 (two) times daily. 90 mL 1   Barberry-Oreg Grape-Goldenseal (BERBERINE COMPLEX PO) Take by mouth daily.     Blood Glucose Monitoring Suppl (ACCU-CHEK GUIDE) w/Device KIT 1 kit by Does not apply route as directed. 1 kit 0   Calcium Carbonate-Vitamin D (CALCIUM 600+D PO) Take  by mouth.     CINNAMON PO Take 1 tablet by mouth as needed.     Cranberry 450 MG CAPS Take 1 capsule by mouth as needed.     Cyanocobalamin (VITAMIN B-12 CR) 1500 MCG TBCR Take 1 tablet by mouth as needed.     Echinacea-Goldenseal (ECHINACEA COMB/GOLDEN SEAL PO) Take 1 tablet by mouth as needed. Reported on 12/16/2015     EPINEPHrine 0.3 mg/0.3 mL IJ SOAJ injection Inject 0.3 mg into the muscle as needed for anaphylaxis.     EPINEPHrine 0.3 mg/0.3 mL IJ SOAJ injection Inject 0.3 mg into the muscle as needed for anaphylaxis. 3 each 1   gabapentin (NEURONTIN) 300 MG capsule TAKE 2 CAPSULES BY MOUTH 3  TIMES DAILY 540 capsule 1   glipiZIDE (GLUCOTROL XL) 5 MG 24 hr tablet Take 1 tablet (5 mg total) by mouth daily with breakfast. 90 tablet 3   glucose blood (ACCU-CHEK GUIDE) test strip Use as instructed 100 each 12   Green Tea, Camillia sinensis, (GREEN TEA PO) Take by mouth as needed. Reported on 12/16/2015     Lactobacillus (ACIDOPHILUS PROBIOTIC PO) Take 1 tablet by  mouth daily.     Lancet Devices (ONETOUCH DELICA PLUS LANCING) MISC USE AS DIRECTED 1 each 1   Lancets Misc. (ACCU-CHEK FASTCLIX LANCET) KIT USE AS DIRECTED 1 kit 0   levocetirizine (XYZAL) 5 MG tablet TAKE 1 TABLET EVERY EVENING 90 tablet 1   MELATONIN ER PO Take by mouth.     metFORMIN (GLUCOPHAGE-XR) 500 MG 24 hr tablet Take 2 tablets (1,000 mg total) by mouth 2 (two) times daily. 360 tablet 3   montelukast (SINGULAIR) 10 MG tablet TAKE 1 TABLET AT BEDTIME 90 tablet 1   Omega-3 Fatty Acids (OMEGA 3 PO) Take 1 tablet by mouth as needed.     omeprazole (PRILOSEC) 40 MG capsule Take 40 mg by mouth daily.     ONETOUCH VERIO test strip USE TO TEST BLOOD GLUCOSE 4 TIMES DAILY 400 strip 3   Polyethyl Glycol-Propyl Glycol (SYSTANE FREE OP) Apply 1 drop to eye daily. Uses thera work for dr eyes per pt     POTASSIUM PO Take by mouth.     predniSONE (DELTASONE) 10 MG tablet Take 2 tablets once a day for the next 3 days. 6 tablet 0   ramipril (ALTACE) 2.5 MG capsule TAKE 1 CAPSULE BY MOUTH  DAILY 90 capsule 3   simvastatin (ZOCOR) 20 MG tablet Take 1 tablet (20 mg total) by mouth daily. 90 tablet 3   Spacer/Aero-Holding Chambers (AEROCHAMBER PLUS) inhaler Use as instructed with MDI 1 each 2   Stinging Nettle 2 % POWD Take 1 tablet by mouth as needed. Reported on 12/16/2015     traZODone (DESYREL) 50 MG tablet Take 0.5-1 tablets (25-50 mg total) by mouth at bedtime as needed. for sleep 90 tablet 3   triamcinolone (NASACORT) 55 MCG/ACT AERO nasal inhaler Place 2 sprays into the nose daily. For nasal congestion. 3 each 2   TURMERIC PO Take 800 mg by mouth as needed. Reported on 12/16/2015     VENTOLIN HFA 108 (90 Base) MCG/ACT inhaler Inhale 2 puffs into the lungs every 6 (six) hours as needed for wheezing or shortness of breath. 54 g 1   No facility-administered medications prior to visit.    PAST MEDICAL HISTORY: Past Medical History:  Diagnosis Date   Acid reflux    Allergic rhinitis    Allergy     Anemia  Arthritis    per MD- pt states no s/s of this    Asthma    Bilateral sciatica    Cataract    removed both eyes    Chronic bilateral low back pain    Colon polyps    Diverticulosis    DM type 2 (diabetes mellitus, type 2) (HCC)    Environmental allergies    flowers, pollen, trees   GERD (gastroesophageal reflux disease)    Hyperlipidemia    Low back pain    Migraine    past hx- control as allergies are controlled    Osteoporosis    PONV (postoperative nausea and vomiting)     PAST SURGICAL HISTORY: Past Surgical History:  Procedure Laterality Date   CATARACT EXTRACTION, BILATERAL Bilateral 2012   CESAREAN SECTION     x 2   COLONOSCOPY     > 10 yrs ago- unsure if removed polyps as was told sev.different things after this colon    DENTAL SURGERY     molars removed    DILATION AND CURETTAGE OF UTERUS     NASAL SINUS SURGERY     TUBAL LIGATION      FAMILY HISTORY: Family History  Problem Relation Age of Onset   Asthma Father    Emphysema Father    Alcohol abuse Father    Diabetes Mellitus II Brother        x 2   Hyperlipidemia Brother    Hypertension Brother    Pancreatic cancer Brother    Prostate cancer Brother    Diabetes Brother    Heart disease Maternal Grandmother        hardening of the arteries   Heart murmur Daughter    Glaucoma Brother    Breast cancer Neg Hx    Colon cancer Neg Hx    Colon polyps Neg Hx    Esophageal cancer Neg Hx    Rectal cancer Neg Hx    Stomach cancer Neg Hx     SOCIAL HISTORY: Social History   Socioeconomic History   Marital status: Married    Spouse name: Not on file   Number of children: 2   Years of education: 16   Highest education level: Not on file  Occupational History   Occupation: Retired  Tobacco Use   Smoking status: Never   Smokeless tobacco: Never  Vaping Use   Vaping Use: Never used  Substance and Sexual Activity   Alcohol use: No    Alcohol/week: 0.0 standard drinks   Drug use: No    Sexual activity: Not on file  Other Topics Concern   Not on file  Social History Narrative      Married 1 son one daughter, relocated to [place] after living in [place] for many years. 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   No caffeine.          Lives at home with her husband and brother.   Right-handed.   No caffeine use.   Social Determinants of Health   Financial Resource Strain: Low Risk    Difficulty of Paying Living Expenses: Not hard at all  Food Insecurity: No Food Insecurity   Worried About Programme researcher, broadcasting/film/video in the Last Year: Never true   Ran Out of Food in the Last Year: Never true  Transportation Needs: No Transportation Needs   Lack of Transportation (Medical): No   Lack of Transportation (Non-Medical): No  Physical Activity: Sufficiently Active  Days of Exercise per Week: 5 days   Minutes of Exercise per Session: 40 min  Stress: No Stress Concern Present   Feeling of Stress : Only a little  Social Connections: Moderately Isolated   Frequency of Communication with Friends and Family: More than three times a week   Frequency of Social Gatherings with Friends and Family: More than three times a week   Attends Religious Services: Never   Marine scientist or Organizations: No   Attends Music therapist: Never   Marital Status: Married  Human resources officer Violence: Not At Risk   Fear of Current or Ex-Partner: No   Emotionally Abused: No   Physically Abused: No   Sexually Abused: No    PHYSICAL EXAM  There were no vitals filed for this visit.  There is no height or weight on file to calculate BMI.  Generalized: Well developed, in no acute distress   Neurological examination  Mentation: Alert oriented to time, place, history taking. Follows all commands speech and language fluent Cranial nerve II-XII: Pupils were equal round reactive to light. Extraocular movements were full, visual field were full on confrontational  test. Facial sensation and strength were normal. Head turning and shoulder shrug  were normal and symmetric. Motor: The motor testing reveals 5 over 5 strength of all 4 extremities. Good symmetric motor tone is noted throughout. No weakness with right hip flexion was noted Sensory: Sensory testing is intact to soft touch on all 4 extremities. No evidence of extinction is noted.  Coordination: Cerebellar testing reveals good finger-nose-finger and heel-to-shin bilaterally.  Gait and station: Gait is normal, steady. Reflexes: Deep tendon reflexes are symmetric and normal bilaterally.   DIAGNOSTIC DATA (LABS, IMAGING, TESTING) - I reviewed patient records, labs, notes, testing and imaging myself where available.  Lab Results  Component Value Date   WBC 3.2 (L) 02/15/2022   HGB 13.7 02/15/2022   HCT 41.9 02/15/2022   MCV 92.9 02/15/2022   PLT 222.0 02/15/2022      Component Value Date/Time   NA 140 02/15/2022 1359   NA 140 04/12/2017 0000   K 4.8 02/15/2022 1359   CL 103 02/15/2022 1359   CO2 29 02/15/2022 1359   GLUCOSE 121 (H) 02/15/2022 1359   BUN 12 02/15/2022 1359   BUN 11 04/12/2017 0000   CREATININE 0.84 02/15/2022 1359   CALCIUM 10.0 02/15/2022 1359   PROT 7.5 02/15/2022 1359   ALBUMIN 4.4 02/15/2022 1359   AST 21 02/15/2022 1359   ALT 15 02/15/2022 1359   ALKPHOS 50 02/15/2022 1359   BILITOT 0.4 02/15/2022 1359   Lab Results  Component Value Date   CHOL 187 02/15/2022   HDL 72.40 02/15/2022   LDLCALC 96 02/15/2022   TRIG 93.0 02/15/2022   CHOLHDL 3 02/15/2022   Lab Results  Component Value Date   HGBA1C 7.7 (H) 02/15/2022   No results found for: VITAMINB12 Lab Results  Component Value Date   TSH 1.13 02/15/2022    ASSESSMENT AND PLAN 76 y.o. year old female  has a past medical history of Acid reflux, Allergic rhinitis, Allergy, Anemia, Arthritis, Asthma, Bilateral sciatica, Cataract, Chronic bilateral low back pain, Colon polyps, Diverticulosis, DM type 2  (diabetes mellitus, type 2) ([place]), Environmental allergies, GERD (gastroesophageal reflux disease), Hyperlipidemia, Low back pain, Migraine, Osteoporosis, and PONV (postoperative nausea and vomiting). here with:  1.  Chronic low back pain, radiating pain to bilateral lower extremities 2.  Acute right hip/pelvic pain  -Currently  pain is well controlled -Continue gabapentin 600 mg up to 3 times daily -Offered referral to orthopedics for the right hip, but may ultimately be coming from the low back, right now feels doing well, hold off on any further intervention, in future consider another ESI, repeat MRI lumbar spine -Encouraged to continue her exercise -Follow-up in 1 year or sooner if needed  Evangeline Dakin, DNP 04/20/2022, 9:15 PM [facility] [address] [place], [place] [address] [phone]

## 2022-04-21 ENCOUNTER — Other Ambulatory Visit: Payer: Self-pay | Admitting: Allergy

## 2022-04-21 ENCOUNTER — Ambulatory Visit: Payer: Medicare Other | Admitting: Neurology

## 2022-04-21 NOTE — Telephone Encounter (Signed)
Dispense 90 day supply with 1 refill. ?

## 2022-04-22 NOTE — Patient Instructions (Addendum)
Asthma ?Continue montelukast 10 mg once a day to prevent cough or wheeze ?Continue albuterol 2 puffs once every 4 hours as needed for cough or wheeze ?You may use albuterol 2 puffs 5- 15 minutes before activity to decrease cough or wheeze ? ?Allergic rhinitis ?Stop atrovent (ipratropium bromide) nasal spray ?Continue allergen avoidance measures directed toward pollens, pets, molds, and cockroach as listed below ?Continue allergen immunotherapy and have access to your epinephrine auto injector device. ?Continue an antihistamine once a day as needed for runny nose or itch ?Continue Azelastine  2 sprays in each nostril up to twice a day as needed for runny nose/drainage down throat ?Stop Flonase  for the next 5-7 days due to nasal irritation. Then restart Flonase 2 sprays in each nostril once a day as needed for stuffy nose. In the right nostril, point the applicator out toward the right ear. In the left nostril, point the applicator out toward the left ear ?Continue saline nasal rinses as needed for nasal symptoms. Try not squeezing the bottle so hard. Use this before any medicated nasal sprays for best result ? ?Reflux ?Continue dietary and lifestyle modifications as listed below ?Continue omeprazole 40 mg once a day to control reflux ? ?Food allergy ?Continue to avoid peanuts, milk, and soy.  In case of an allergic reaction, take Benadryl 50 mg  every 4 hours, and if life-threatening symptoms occur, inject with EpiPen 0.3 mg. ? ? ?Call the clinic if this treatment plan is not working well for you. ? ?Follow up in 3-4 months or sooner if needed. ? ?Reducing Pollen Exposure ?The American Academy of Allergy, Asthma and Immunology suggests the following steps to reduce your exposure to pollen during allergy seasons. ?Do not hang sheets or clothing out to dry; pollen may collect on these items. ?Do not mow lawns or spend time around freshly cut grass; mowing stirs up pollen. ?Keep windows closed at night.  Keep car  windows closed while driving. ?Minimize morning activities outdoors, a time when pollen counts are usually at their highest. ?Stay indoors as much as possible when pollen counts or humidity is high and on windy days when pollen tends to remain in the air longer. ?Use air conditioning when possible.  Many air conditioners have filters that trap the pollen spores. ?Use a HEPA room air filter to remove pollen form the indoor air you breathe. ? ?Control of Dog or Cat Allergen ?Avoidance is the best way to manage a dog or cat allergy. If you have a dog or cat and are allergic to dog or cats, consider removing the dog or cat from the home. ?If you have a dog or cat but don?t want to find it a new home, or if your family wants a pet even though someone in the household is allergic, here are some strategies that may help keep symptoms at bay: ? ?Keep the pet out of your bedroom and restrict it to only a few rooms. Be advised that keeping the dog or cat in only one room will not limit the allergens to that room. ?Don?t pet, hug or kiss the dog or cat; if you do, wash your hands with soap and water. ?High-efficiency particulate air (HEPA) cleaners run continuously in a bedroom or living room can reduce allergen levels over time. ?Regular use of a high-efficiency vacuum cleaner or a central vacuum can reduce allergen levels. ?Giving your dog or cat a bath at least once a week can reduce airborne allergen. ? ?Control of  Mold Allergen ?Mold and fungi can grow on a variety of surfaces provided certain temperature and moisture conditions exist.  Outdoor molds grow on plants, decaying vegetation and soil.  The major outdoor mold, Alternaria and Cladosporium, are found in very high numbers during hot and dry conditions.  Generally, a late Summer - Fall peak is seen for common outdoor fungal spores.  Rain will temporarily lower outdoor mold spore count, but counts rise rapidly when the rainy period ends.  The most important indoor  molds are Aspergillus and Penicillium.  Dark, humid and poorly ventilated basements are ideal sites for mold growth.  The next most common sites of mold growth are the bathroom and the kitchen. ? ?Outdoor [employer] ?Use air conditioning and keep windows closed ?Avoid exposure to decaying vegetation. ?Avoid leaf raking. ?Avoid grain handling. ?Consider wearing a face mask if working in moldy areas. ? ?Indoor Mold Control ?Maintain humidity below 50%. ?Clean washable surfaces with 5% bleach solution. ?Remove sources e.g. Contaminated carpets. ? ?Control of Cockroach Allergen ?Cockroach allergen has been identified as an important cause of acute attacks of asthma, especially in urban settings.  There are fifty-five species of cockroach that exist in the [place], however only three, the [place], [place] species produce allergen that can affect patients with Asthma.  Allergens can be obtained from fecal particles, egg casings and secretions from cockroaches. ?   ?Remove food sources. ?Reduce access to water. ?Seal access and entry points. ?Spray runways with 0.5-1% Diazinon or Chlorpyrifos ?Blow boric acid power under stoves and refrigerator. ?Place bait stations (hydramethylnon) at feeding sites. ? ? ?

## 2022-04-25 ENCOUNTER — Ambulatory Visit: Payer: Self-pay

## 2022-04-25 ENCOUNTER — Encounter: Payer: Self-pay | Admitting: Family

## 2022-04-25 ENCOUNTER — Ambulatory Visit: Payer: Medicare HMO | Admitting: Family

## 2022-04-25 VITALS — BP 126/70 | HR 73 | Temp 98.6°F | Resp 16 | Ht 64.17 in | Wt 135.8 lb

## 2022-04-25 DIAGNOSIS — J309 Allergic rhinitis, unspecified: Secondary | ICD-10-CM

## 2022-04-25 DIAGNOSIS — J3489 Other specified disorders of nose and nasal sinuses: Secondary | ICD-10-CM

## 2022-04-25 DIAGNOSIS — J4540 Moderate persistent asthma, uncomplicated: Secondary | ICD-10-CM | POA: Diagnosis not present

## 2022-04-25 DIAGNOSIS — J3089 Other allergic rhinitis: Secondary | ICD-10-CM

## 2022-04-25 DIAGNOSIS — T7800XD Anaphylactic reaction due to unspecified food, subsequent encounter: Secondary | ICD-10-CM

## 2022-04-25 NOTE — Progress Notes (Signed)
? ?[place] [address] ?Dept: [phone] ? ?FOLLOW UP NOTE ? ?Patient ID: Kimberly Lam, female    DOB: 07-30-1946  Age: 76 y.o. MRN: [id number] ?Date of Office Visit: 04/25/2022 ? ?Assessment  ?Chief Complaint: Follow-up (Had sinus infection recently. Still having some symptoms from sinus infection. Mucous is clear now.) and Asthma (No issues with that per patient.) ? ?HPI ?Kimberly Lam is a 76 year old female who presents today for follow-up of moderate persistent asthma, perennial and seasonal allergic rhinoconjunctivitis, anaphylactic shock due to food, and nasal septal perforation.  She was last seen on March 28, 2022 by Gareth Morgan, FNP.  He denies any new diagnosis or surgery since her last office visit. ? ?Moderate persistent asthma is reported as controlled with Singulair 10 mg once a day and albuterol as needed.  She denies any coughing, wheezing, tightness in chest, shortness of breath, and nocturnal awakenings due to breathing problems.  Since her last office visit she has been on 1 round of steroids for a sinus infection.  She has not had to use her albuterol inhaler since we last saw her. ? ?Perennial and seasonal allergic rhinoconjunctivitis is reported as moderately controlled with allergy injections every 2 weeks, she alternates between antihistamines, Flonase 1 spray each nostril twice a day, Atrovent nasal spray, azelastine nasal spray, and saline rinses once a day to twice a day.  She reports off-and-on rhinorrhea, nasal congestion at times, and postnasal drip at times.  She has had 1 sinus infection since we last saw her.  She reports that her allergy injections help, but if she goes further than every 2 weeks that she will get a headache.  She denies any large local reactions with her allergy injections.  She reports that when she uses her saline rinse that water will come out her eyes and  her ears.  She uses the bottle for the saline rinse.  Instructed her  to try not squeezing the bottle so hard. ? ?She reports that she has tried calling Dr. Lucia Gaskins, her ENT, but has not been able to get in touch with him.  Instructed her that he has retired.  She reports that she will follow-up with Dr. Redmond Baseman who his she is seen in the past. ? ?She continues to avoid peanuts, cows milk, and soybean without any accidental ingestion or use of her EpiPen.  She reports her EpiPen is up-to-date. ? ? ?Drug Allergies:  ?Allergies  ?Allergen Reactions  ? Peanut-Containing Drug Products Anaphylaxis  ? Eggs Or Egg-Derived Products Nausea Only  ?  Nausea only- can eat cakes with eggs   ? Milk-Related Compounds Nausea And Vomiting  ?  Milk and cheese  ? Actos [Pioglitazone] Nausea Only  ? Soy Allergy Nausea Only  ?  Pt states had allergy testing and was told allergic to soy-causes nausea but no other reactions  ? Vicodin [Hydrocodone-Acetaminophen] Nausea Only  ? ? ?Review of Systems: ?Review of Systems  ?Constitutional:  Negative for chills and fever.  ?Eyes:   ?     Reports itchy watery eyes for which she has eye drops  ?Respiratory:  Negative for cough, shortness of breath and wheezing.   ?Cardiovascular:  Negative for chest pain and palpitations.  ?Gastrointestinal:   ?     Reports reflux symptoms for a couple nights after taking medication for sinus infection  ?Genitourinary:  Negative for frequency.  ?Skin:  Negative for itching and rash.  ?Neurological:  Negative for headaches.  ?  Endo/Heme/Allergies:  Positive for environmental allergies.  ? ? ?Physical Exam: ?BP 126/70   Pulse 73   Temp 98.6 ?F (37 ?C) (Temporal)   Resp 16   Ht 5' 4.17" (1.63 m)   Wt 135 lb 12.8 oz (61.6 kg)   SpO2 97%   BMI 23.18 kg/m?   ? ?Physical Exam ?Constitutional:   ?   Appearance: Normal appearance.  ?HENT:  ?   Head: Normocephalic and atraumatic.  ?   Comments: Pharynx normal, eyes normal, ears: Unable to see right tympanic membrane due to cerumen.  Left ear normal, nose: Irritation/blood noted in right  nostril ?   Right Ear: Ear canal and external ear normal.  ?   Left Ear: Tympanic membrane, ear canal and external ear normal.  ?   Mouth/Throat:  ?   Mouth: Mucous membranes are moist.  ?   Pharynx: Oropharynx is clear.  ?Eyes:  ?   Conjunctiva/sclera: Conjunctivae normal.  ?Cardiovascular:  ?   Rate and Rhythm: Normal rate and regular rhythm.  ?   Heart sounds: Normal heart sounds.  ?Pulmonary:  ?   Effort: Pulmonary effort is normal.  ?   Breath sounds: Normal breath sounds.  ?   Comments: Lungs clear to auscultation ?Musculoskeletal:  ?   Cervical back: Neck supple.  ?Skin: ?   General: Skin is warm.  ?Neurological:  ?   Mental Status: She is alert and oriented to person, place, and time.  ?Psychiatric:     ?   Mood and Affect: Mood normal.     ?   Behavior: Behavior normal.     ?   Thought Content: Thought content normal.     ?   Judgment: Judgment normal.  ? ? ?Diagnostics: ?FVC 1.93 L (84%), FEV1 0.75 L (42%).  Predicted FVC 2.31 L, predicted FEV1 1.79 L.  Spirometry indicates possible severe obstruction.  Poor technique and effort noted.  We will not make any changes to her breathing regimen due to her being asymptomatic. ? ?Assessment and Plan: ?1. Moderate persistent asthma, uncomplicated   ?2. Perennial and seasonal allergic rhinoconjunctivitis   ?3. Nasal septal perforation   ?4. Anaphylactic shock due to food, subsequent encounter   ? ? ?No orders of the defined types were placed in this encounter. ? ? ?Patient Instructions  ?Asthma ?Continue montelukast 10 mg once a day to prevent cough or wheeze ?Continue albuterol 2 puffs once every 4 hours as needed for cough or wheeze ?You may use albuterol 2 puffs 5- 15 minutes before activity to decrease cough or wheeze ? ?Allergic rhinitis ?Stop atrovent (ipratropium bromide) nasal spray ?Continue allergen avoidance measures directed toward pollens, pets, molds, and cockroach as listed below ?Continue allergen immunotherapy and have access to your epinephrine  auto injector device. ?Continue an antihistamine once a day as needed for runny nose or itch ?Continue Azelastine  2 sprays in each nostril up to twice a day as needed for runny nose/drainage down throat ?Stop Flonase  for the next 5-7 days due to nasal irritation. Then restart Flonase 2 sprays in each nostril once a day as needed for stuffy nose. In the right nostril, point the applicator out toward the right ear. In the left nostril, point the applicator out toward the left ear ?Continue saline nasal rinses as needed for nasal symptoms. Try not squeezing the bottle so hard. Use this before any medicated nasal sprays for best result ? ?Reflux ?Continue dietary and lifestyle modifications as listed below ?  Continue omeprazole 40 mg once a day to control reflux ? ?Food allergy ?Continue to avoid peanuts, milk, and soy.  In case of an allergic reaction, take Benadryl 50 mg  every 4 hours, and if life-threatening symptoms occur, inject with EpiPen 0.3 mg. ? ? ?Call the clinic if this treatment plan is not working well for you. ? ?Follow up in 3-4 months or sooner if needed. ? ?Reducing Pollen Exposure ?The American Academy of Allergy, Asthma and Immunology suggests the following steps to reduce your exposure to pollen during allergy seasons. ?Do not hang sheets or clothing out to dry; pollen may collect on these items. ?Do not mow lawns or spend time around freshly cut grass; mowing stirs up pollen. ?Keep windows closed at night.  Keep car windows closed while driving. ?Minimize morning activities outdoors, a time when pollen counts are usually at their highest. ?Stay indoors as much as possible when pollen counts or humidity is high and on windy days when pollen tends to remain in the air longer. ?Use air conditioning when possible.  Many air conditioners have filters that trap the pollen spores. ?Use a HEPA room air filter to remove pollen form the indoor air you breathe. ? ?Control of Dog or Cat Allergen ?Avoidance  is the best way to manage a dog or cat allergy. If you have a dog or cat and are allergic to dog or cats, consider removing the dog or cat from the home. ?If you have a dog or cat but don?t want to find it a new

## 2022-05-03 ENCOUNTER — Ambulatory Visit (INDEPENDENT_AMBULATORY_CARE_PROVIDER_SITE_OTHER): Payer: Medicare HMO

## 2022-05-03 DIAGNOSIS — J309 Allergic rhinitis, unspecified: Secondary | ICD-10-CM | POA: Diagnosis not present

## 2022-05-17 ENCOUNTER — Ambulatory Visit (INDEPENDENT_AMBULATORY_CARE_PROVIDER_SITE_OTHER): Payer: Medicare HMO

## 2022-05-17 DIAGNOSIS — J309 Allergic rhinitis, unspecified: Secondary | ICD-10-CM

## 2022-05-18 ENCOUNTER — Encounter: Payer: Self-pay | Admitting: Adult Health

## 2022-05-18 ENCOUNTER — Ambulatory Visit (INDEPENDENT_AMBULATORY_CARE_PROVIDER_SITE_OTHER): Payer: Medicare HMO | Admitting: Adult Health

## 2022-05-18 VITALS — BP 120/60 | HR 76 | Temp 98.5°F | Ht 64.0 in | Wt 136.0 lb

## 2022-05-18 DIAGNOSIS — G4700 Insomnia, unspecified: Secondary | ICD-10-CM | POA: Diagnosis not present

## 2022-05-18 DIAGNOSIS — E119 Type 2 diabetes mellitus without complications: Secondary | ICD-10-CM | POA: Diagnosis not present

## 2022-05-18 LAB — POCT GLYCOSYLATED HEMOGLOBIN (HGB A1C): HbA1c POC (<> result, manual entry): 6.9 % (ref 4.0–5.6)

## 2022-05-18 MED ORDER — ACCU-CHEK FASTCLIX LANCETS MISC: 2 refills | Status: DC

## 2022-05-18 MED ORDER — BD SWAB SINGLE USE REGULAR PADS: MEDICATED_PAD | 3 refills | Status: DC

## 2022-05-18 MED ORDER — ACCU-CHEK GUIDE VI STRP: ORAL_STRIP | 6 refills | Status: DC

## 2022-05-18 NOTE — Patient Instructions (Signed)
It was great seeing you today your a1c came down from 7.7 to 6.9  I will see you back in 6 months

## 2022-05-18 NOTE — Progress Notes (Signed)
Subjective:    Patient ID: Kimberly Lam, female    DOB: 14-Sep-1946, 76 y.o.   MRN: [id number]  HPI 76 year old female who  has a past medical history of Acid reflux, Allergic rhinitis, Allergy, Anemia, Arthritis, Asthma, Bilateral sciatica, Cataract, Chronic bilateral low back pain, Colon polyps, Diverticulosis, DM type 2 (diabetes mellitus, type 2) ([place]), Environmental allergies, GERD (gastroesophageal reflux disease), Hyperlipidemia, Low back pain, Migraine, Osteoporosis, and PONV (postoperative nausea and vomiting).  She presents to the office today for follow-up regarding type II insomnia.  Diabetes mellitus type 2-is managed with metformin 1000 mg extended release twice daily and glipzide 5 mg XL daily.   She does check her blood sugars at home and reports readings in the 100s to 200s, depending on what she eats.  She denies episodes of hypoglycemia.  Does take ramipril 2.5 mg daily for kidney protection. Her last A1c was 7.7 in February 2023, this was up from 7.1- 3 months prior.  She has been working on staying more active and eating healthier. Lab Results  Component Value Date   HGBA1C 7.7 (H) 02/15/2022   Insomnia -trazodone 25 to 50 mg nightly.  She feels as though this works well for her and she is able to get a good night sleep without waking up feeling fatigued or any daytime somnolence related to hangover effect.  Review of Systems See HPI   Past Medical History:  Diagnosis Date   Acid reflux    Allergic rhinitis    Allergy    Anemia    Arthritis    per MD- pt states no s/s of this    Asthma    Bilateral sciatica    Cataract    removed both eyes    Chronic bilateral low back pain    Colon polyps    Diverticulosis    DM type 2 (diabetes mellitus, type 2) (HCC)    Environmental allergies    flowers, pollen, trees   GERD (gastroesophageal reflux disease)    Hyperlipidemia    Low back pain    Migraine    past hx- control as allergies are controlled     Osteoporosis    PONV (postoperative nausea and vomiting)     Social History   Socioeconomic History   Marital status: Married    Spouse name: Not on file   Number of children: 2   Years of education: 16   Highest education level: Not on file  Occupational History   Occupation: Retired  Tobacco Use   Smoking status: Never   Smokeless tobacco: Never  Vaping Use   Vaping Use: Never used  Substance and Sexual Activity   Alcohol use: No    Alcohol/week: 0.0 standard drinks   Drug use: No   Sexual activity: Not on file  Other Topics Concern   Not on file  Social History Narrative      Married 1 son one daughter, relocated to [place] after living in [place] for many years. She is a Writer of [employer]   No caffeine.          Lives at home with her husband and brother.   Right-handed.   No caffeine use.   Social Determinants of Health   Financial Resource Strain: Low Risk    Difficulty of Paying Living Expenses: Not hard at all  Food Insecurity: No Food Insecurity   Worried About Charity fundraiser in the Last Year: Never  true   Ran Out of Food in the Last Year: Never true  Transportation Needs: No Transportation Needs   Lack of Transportation (Medical): No   Lack of Transportation (Non-Medical): No  Physical Activity: Sufficiently Active   Days of Exercise per Week: 5 days   Minutes of Exercise per Session: 40 min  Stress: No Stress Concern Present   Feeling of Stress : Only a little  Social Connections: Moderately Isolated   Frequency of Communication with Friends and Family: More than three times a week   Frequency of Social Gatherings with Friends and Family: More than three times a week   Attends Religious Services: Never   Marine scientist or Organizations: No   Attends Music therapist: Never   Marital Status: Married  Human resources officer Violence: Not At Risk   Fear of Current or Ex-Partner: No   Emotionally  Abused: No   Physically Abused: No   Sexually Abused: No    Past Surgical History:  Procedure Laterality Date   CATARACT EXTRACTION, BILATERAL Bilateral 2012   CESAREAN SECTION     x 2   COLONOSCOPY     > 10 yrs ago- unsure if removed polyps as was told sev.different things after this colon    DENTAL SURGERY     molars removed    DILATION AND CURETTAGE OF UTERUS     NASAL SINUS SURGERY     TUBAL LIGATION      Family History  Problem Relation Age of Onset   Asthma Father    Emphysema Father    Alcohol abuse Father    Diabetes Mellitus II Brother        x 2   Hyperlipidemia Brother    Hypertension Brother    Pancreatic cancer Brother    Prostate cancer Brother    Diabetes Brother    Heart disease Maternal Grandmother        hardening of the arteries   Heart murmur Daughter    Glaucoma Brother    Breast cancer Neg Hx    Colon cancer Neg Hx    Colon polyps Neg Hx    Esophageal cancer Neg Hx    Rectal cancer Neg Hx    Stomach cancer Neg Hx     Allergies  Allergen Reactions   Peanut-Containing Drug Products Anaphylaxis   Eggs Or Egg-Derived Products Nausea Only    Nausea only- can eat cakes with eggs    Milk-Related Compounds Nausea And Vomiting    Milk and cheese   Actos [Pioglitazone] Nausea Only   Soy Allergy Nausea Only    Pt states had allergy testing and was told allergic to soy-causes nausea but no other reactions   Vicodin [Hydrocodone-Acetaminophen] Nausea Only    Current Outpatient Medications on File Prior to Visit  Medication Sig Dispense Refill   Accu-Chek FastClix Lancets MISC Use to check blood glucose BID 102 each 2   Alcohol Swabs (B-D SINGLE USE SWABS REGULAR) PADS USE TO TEST BLOOD GLUCOSE FOUR TIMES DAILY 200 each 3   alendronate (FOSAMAX) 70 MG tablet TAKE 1 TABLET BY MOUTH  WEEKLY WITH 8 OZ OF PLAIN  WATER 30 MINUTES BEFORE  FIRST FOOD, DRINK OR MEDS.  STAY UPRIGHT FOR 30 MINS 12 tablet 3   Ascorbic Acid (VITAMIN C) 1000 MG tablet Take  1,000 mg by mouth as needed.     azelastine (ASTELIN) 0.1 % nasal spray Place 2 sprays into both nostrils 2 (two) times daily. [place]  mL 1   Barberry-Oreg Grape-Goldenseal (BERBERINE COMPLEX PO) Take by mouth daily.     Blood Glucose Monitoring Suppl (ACCU-CHEK GUIDE) w/Device KIT 1 kit by Does not apply route as directed. 1 kit 0   Calcium Carbonate-Vitamin D (CALCIUM 600+D PO) Take by mouth.     CINNAMON PO Take 1 tablet by mouth as needed.     Cranberry 450 MG CAPS Take 1 capsule by mouth as needed.     Cyanocobalamin (VITAMIN B-12 CR) 1500 MCG TBCR Take 1 tablet by mouth as needed.     Echinacea-Goldenseal (ECHINACEA COMB/GOLDEN SEAL PO) Take 1 tablet by mouth as needed. Reported on 12/16/2015     EPINEPHrine 0.3 mg/0.3 mL IJ SOAJ injection Inject 0.3 mg into the muscle as needed for anaphylaxis. 3 each 1   fluticasone (FLONASE) 50 MCG/ACT nasal spray USE 2 SPRAYS INTO BOTH NOSTRILS DAILY. 48 g 1   gabapentin (NEURONTIN) 300 MG capsule TAKE 2 CAPSULES BY MOUTH 3  TIMES DAILY 540 capsule 1   glipiZIDE (GLUCOTROL XL) 5 MG 24 hr tablet Take 1 tablet (5 mg total) by mouth daily with breakfast. 90 tablet 3   glucose blood (ACCU-CHEK GUIDE) test strip Use as instructed 100 each 12   Green Tea, Camillia sinensis, (GREEN TEA PO) Take by mouth as needed. Reported on 12/16/2015     ipratropium (ATROVENT) 0.03 % nasal spray Place into the nose.     Lactobacillus (ACIDOPHILUS PROBIOTIC PO) Take 1 tablet by mouth daily.     Lancet Devices (ONETOUCH DELICA PLUS LANCING) MISC USE AS DIRECTED 1 each 1   levocetirizine (XYZAL) 5 MG tablet TAKE 1 TABLET EVERY EVENING 90 tablet 1   MELATONIN ER PO Take by mouth.     metFORMIN (GLUCOPHAGE-XR) 500 MG 24 hr tablet Take 2 tablets (1,000 mg total) by mouth 2 (two) times daily. 360 tablet 3   montelukast (SINGULAIR) 10 MG tablet TAKE 1 TABLET AT BEDTIME 90 tablet 1   Omega-3 Fatty Acids (OMEGA 3 PO) Take 1 tablet by mouth as needed.     omeprazole (PRILOSEC) 40 MG  capsule Take 40 mg by mouth daily.     ONETOUCH VERIO test strip USE TO TEST BLOOD GLUCOSE 4 TIMES DAILY 400 strip 3   Polyethyl Glycol-Propyl Glycol (SYSTANE FREE OP) Apply 1 drop to eye daily. Uses thera work for dr eyes per pt     POTASSIUM PO Take by mouth.     predniSONE (DELTASONE) 10 MG tablet Take 2 tablets once a day for the next 3 days. 6 tablet 0   ramipril (ALTACE) 2.5 MG capsule TAKE 1 CAPSULE BY MOUTH  DAILY 90 capsule 3   simvastatin (ZOCOR) 20 MG tablet Take 1 tablet (20 mg total) by mouth daily. 90 tablet 3   Spacer/Aero-Holding Chambers (AEROCHAMBER PLUS) inhaler Use as instructed with MDI 1 each 2   Stinging Nettle 2 % POWD Take 1 tablet by mouth as needed. Reported on 12/16/2015     traZODone (DESYREL) 50 MG tablet Take 0.5-1 tablets (25-50 mg total) by mouth at bedtime as needed. for sleep 90 tablet 3   TURMERIC PO Take 800 mg by mouth as needed. Reported on 12/16/2015     VENTOLIN HFA 108 (90 Base) MCG/ACT inhaler Inhale 2 puffs into the lungs every 6 (six) hours as needed for wheezing or shortness of breath. 54 g 1   No current facility-administered medications on file prior to visit.    BP 120/60  Pulse 76   Temp 98.5 F (36.9 C) (Oral)   Ht $R'5\' 4"'[place]$  (1.626 m)   Wt 136 lb (61.7 kg)   SpO2 98%   BMI 23.34 kg/m       Objective:   Physical Exam Constitutional:      Appearance: Normal appearance.  Cardiovascular:     Rate and Rhythm: Normal rate and regular rhythm.     Pulses: Normal pulses.     Heart sounds: Normal heart sounds.  Pulmonary:     Effort: Pulmonary effort is normal.     Breath sounds: Normal breath sounds.  Musculoskeletal:        General: Normal range of motion.  Skin:    General: Skin is warm and dry.  Neurological:     General: No focal deficit present.     Mental Status: She is alert and oriented to person, place, and time.  Psychiatric:        Mood and Affect: Mood normal.        Behavior: Behavior normal.        Judgment:  Judgment normal.      Assessment & Plan:  1. Diabetes mellitus without complication (HCC)  - POC HgB A1c- 6.9 - has improved - No changes in medication  - follow up in 6 months or sooner if needed - glucose blood (ACCU-CHEK GUIDE) test strip; Use as instructed  Dispense: 300 each; Refill: 6 - Accu-Chek FastClix Lancets MISC; Use to check blood glucose TID  Dispense: 300 each; Refill: 2 - Alcohol Swabs (B-D SINGLE USE SWABS REGULAR) PADS; USE TO TEST BLOOD GLUCOSE FOUR TIMES DAILY  Dispense: 200 each; Refill: 3  2. Insomnia, unspecified type - Continue Trazodone   [name], NP

## 2022-05-19 ENCOUNTER — Other Ambulatory Visit: Payer: Self-pay | Admitting: Adult Health

## 2022-05-19 DIAGNOSIS — E119 Type 2 diabetes mellitus without complications: Secondary | ICD-10-CM

## 2022-05-25 ENCOUNTER — Encounter: Payer: Self-pay | Admitting: Neurology

## 2022-05-25 ENCOUNTER — Ambulatory Visit: Payer: Medicare HMO | Admitting: Neurology

## 2022-05-25 VITALS — BP 120/70 | HR 80 | Ht 64.0 in | Wt 135.0 lb

## 2022-05-25 DIAGNOSIS — M5416 Radiculopathy, lumbar region: Secondary | ICD-10-CM | POA: Diagnosis not present

## 2022-05-25 DIAGNOSIS — R251 Tremor, unspecified: Secondary | ICD-10-CM | POA: Diagnosis not present

## 2022-05-25 MED ORDER — GABAPENTIN 300 MG PO CAPS: ORAL_CAPSULE | ORAL | 2 refills | Status: DC

## 2022-05-25 NOTE — Progress Notes (Signed)
PATIENT: Kimberly Lam DOB: 11/04/1946  REASON FOR VISIT: follow up HISTORY FROM: patient PRIMARY NEUROLOGIST: Dr.Yan   HISTORY Kimberly Lam is a 76 years old female, seen in refer by her primary care nurse practitioner [name] for evaluation of leg cramping, tremors, initial evaluation was on January 23, 2018.     She has past medical history of hypertension, diabetes, hyperlipidemia   I saw her initially in 2016 for evaluation of low back pain, radiating pain to bilateral hip, bilateral leg muscle cramping, and weakness.   She has history of low back pain since 2006, midline low back, occasionally go to posterior thigh, go down below her knee, worsening with prolonged standing, or walking,   Previous her low back pain has responded very well to epidural injection, but the benefit gradually fades away, over the years, she complains of worsening low back pain, radiating pain to bilateral lower extremity again, in addition, she complains of bilateral feet paresthesia,    She had a history of diabetes, she denies gait difficulty, no bowel and bladder incontinence, she denies bilateral upper extremity paresthesia or weakness.    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     Note from Dr. Franciso Bend, [facility], [place] in 2017, she was diagnosed with degenerative spondylolisthesis L4-L5, L5-S1, neurogenic claudication, low back pain, received epidural injection in Feb 2015, benefit lasted about one year   MRI of lumbar spine December 2016, There is a transitional L6/S1 vertebral body. 8 mm of anterolisthesis of L5 upon L6/S1 of a degenerative nature due to severe facet hypertrophy. At this level, there is mild spinal stenosis and moderately severe right foraminal narrowing. There could be compression of the right L5  nerve root.  Degenerative changes at L6/S1-S2 lead to severe right foraminal narrowing that could lead to compression of the exiting right S1 nerve root   She continue complains of chronic low back pain, today she also complains of new onset right hand shaking, that was intermittent, getting worse when she holding utensils, writing, mild involvement on the left side, she continue complains of frequent lower extremity cramping especially on the right side, difficulty to walk in the morning.  There was no family history of tremor   UPDATE Nov 5th 2019: She was given prescription of gabapentin for her bilateral feet paresthesia, 300 mg 4 times a day does help her lower extremity cramps, but complains of daytime sleepiness, but she often woke up in the middle of the night complains of left muscle cramping, difficulty going to sleep,   We again personally reviewed MRI of lumbar spine in December 2016, transitional L6-S1 vertebral body, 8 mm of anterolisthesis of L5 upon S1 with severe facet hypertrophy, mild spinal stenosis, moderate severe right foraminal narrowing, potential compression of right L5 nerve roots,   UPDATE July 10 2019: She is overall doing much better, taking gabapentin 300 mg 2 tablets before bedtime, open skip daytime dose, epidural injection few months ago has helped her as well, she exercise regularly, denies gait abnormality, denies bowel and bladder incontinence   Update November 21, 2019 SS: She overall continues to do very well.  Apparently, she rotates her gabapentin dosing, says she takes gabapentin 300 mg, 2 capsules at least twice a day, at most she may take 4 times a day.  She adjust the regimen depending  on her leg cramps.  She also has some right hip pain.  She knows that her low back gets to bothering her, she will do stretching, and she is able to control it well.  She is going to relocate to [place], but may still come here if she can't find another neurologist.    Update  April 06, 2020 SS: Here today to discuss new problem of right hip pain.  Indicates since Friday, right hip/pelvic pain, with weightbearing and turning.  She denies fall or injury.  She denies pain down the legs, no numbness or weakness.  She denies any urinary symptoms.  She remains on gabapentin 600 mg twice a day for cramping in her legs.  Indicates this pain is not typical of her chronic low back pain, which is usually in the center of her back.  She presents today unaccompanied. She has not tried tylenol or ibuprofen.  Update April 15, 2021 SS: Here today alone, decided not to move to [place], will be going to [place] to take care of her son. Chronic low back pain is "fantastic", certain times it may bother her, does her exercises with good benefit. Right hip is only bothersome when lying on it. Currently taking 600 mg twice daily, will go to 3 times daily if pain worsens, will drop dose after few days. Is off Meloxicam. No falls. Tries to walk 1-2 miles 3-4 days a week. Pain doesn't radiate down both legs.   Had x-ray of the right hip and pelvis in April 2021, was negative, no acute fracture or dislocation, no significant arthritic changes.  Felt the right hip pain could be coming from the back.  Has previously had ESI, last was Jan 2020, would do again if became bothersome.   Update 05/25/22 SS: Here today alone, if she feels any back discomfort will do stretching exercises with good benefit. Hip pain is not bothersome. Normally takes gabapentin 600 mg twice daily, will add in 3rd dose if needed for nocturnal leg cramps, could be few weeks without any leg cramps. Takes trazodone for sleep. Gets around well, no falls. Sometimes tremor in right hand, when trying to write, no other times is it bothersome.   REVIEW OF SYSTEMS: Out of a complete 14 system review of symptoms, the patient complains only of the following symptoms, and all other reviewed systems are negative.  See HPI  ALLERGIES: Allergies   Allergen Reactions   Peanut-Containing Drug Products Anaphylaxis   Eggs Or Egg-Derived Products Nausea Only    Nausea only- can eat cakes with eggs    Milk-Related Compounds Nausea And Vomiting    Milk and cheese   Actos [Pioglitazone] Nausea Only   Soy Allergy Nausea Only    Pt states had allergy testing and was told allergic to soy-causes nausea but no other reactions   Vicodin [Hydrocodone-Acetaminophen] Nausea Only    HOME MEDICATIONS: Outpatient Medications Prior to Visit  Medication Sig Dispense Refill   Accu-Chek FastClix Lancets MISC Use to check blood glucose TID 300 each 2   Alcohol Swabs (B-D SINGLE USE SWABS REGULAR) PADS USE TO TEST BLOOD GLUCOSE FOUR TIMES DAILY 200 each 3   alendronate (FOSAMAX) 70 MG tablet TAKE 1 TABLET BY MOUTH  WEEKLY WITH 8 OZ OF PLAIN  WATER 30 MINUTES BEFORE  FIRST FOOD, DRINK OR MEDS.  STAY UPRIGHT FOR 30 MINS 12 tablet 3   Ascorbic Acid (VITAMIN C) 1000 MG tablet Take 1,000 mg by mouth as needed.  azelastine (ASTELIN) 0.1 % nasal spray Place 2 sprays into both nostrils 2 (two) times daily. 90 mL 1   Barberry-Oreg Grape-Goldenseal (BERBERINE COMPLEX PO) Take by mouth daily.     Blood Glucose Monitoring Suppl (ACCU-CHEK GUIDE) w/Device KIT 1 kit by Does not apply route as directed. 1 kit 0   CINNAMON PO Take 1 tablet by mouth as needed.     Cranberry 450 MG CAPS Take 1 capsule by mouth as needed.     Cyanocobalamin (VITAMIN B-12 CR) 1500 MCG TBCR Take 1 tablet by mouth as needed.     Echinacea-Goldenseal (ECHINACEA COMB/GOLDEN SEAL PO) Take 1 tablet by mouth as needed. Reported on 12/16/2015     EPINEPHrine 0.3 mg/0.3 mL IJ SOAJ injection Inject 0.3 mg into the muscle as needed for anaphylaxis. 3 each 1   fenoprofen (NALFON) 600 MG TABS tablet Take 600 mg by mouth 2 (two) times daily.     fluticasone (FLONASE) 50 MCG/ACT nasal spray USE 2 SPRAYS INTO BOTH NOSTRILS DAILY. 48 g 1   glipiZIDE (GLUCOTROL XL) 5 MG 24 hr tablet Take 1 tablet (5 mg  total) by mouth daily with breakfast. 90 tablet 3   glucose blood (ACCU-CHEK GUIDE) test strip Use as instructed 300 each 6   Green Tea, Camillia sinensis, (GREEN TEA PO) Take by mouth as needed. Reported on 12/16/2015     ipratropium (ATROVENT) 0.03 % nasal spray Place into the nose.     Lactobacillus (ACIDOPHILUS PROBIOTIC PO) Take 1 tablet by mouth daily.     levocetirizine (XYZAL) 5 MG tablet TAKE 1 TABLET EVERY EVENING 90 tablet 1   MELATONIN ER PO Take by mouth.     metFORMIN (GLUCOPHAGE-XR) 500 MG 24 hr tablet Take 2 tablets (1,000 mg total) by mouth 2 (two) times daily. 360 tablet 3   montelukast (SINGULAIR) 10 MG tablet TAKE 1 TABLET AT BEDTIME 90 tablet 1   Omega-3 Fatty Acids (OMEGA 3 PO) Take 1 tablet by mouth as needed.     omeprazole (PRILOSEC) 40 MG capsule Take 40 mg by mouth daily.     ONETOUCH VERIO test strip USE TO TEST BLOOD GLUCOSE 4 TIMES DAILY 400 strip 3   Polyethyl Glycol-Propyl Glycol (SYSTANE FREE OP) Apply 1 drop to eye daily. Uses thera work for dr eyes per pt     POTASSIUM PO Take by mouth.     ramipril (ALTACE) 2.5 MG capsule TAKE 1 CAPSULE BY MOUTH  DAILY 90 capsule 3   simvastatin (ZOCOR) 20 MG tablet Take 1 tablet (20 mg total) by mouth daily. 90 tablet 3   Stinging Nettle 2 % POWD Take 1 tablet by mouth as needed. Reported on 12/16/2015     traZODone (DESYREL) 50 MG tablet Take 0.5-1 tablets (25-50 mg total) by mouth at bedtime as needed. for sleep 90 tablet 3   TURMERIC PO Take 800 mg by mouth as needed. Reported on 12/16/2015     VENTOLIN HFA 108 (90 Base) MCG/ACT inhaler Inhale 2 puffs into the lungs every 6 (six) hours as needed for wheezing or shortness of breath. 54 g 1   gabapentin (NEURONTIN) 300 MG capsule TAKE 2 CAPSULES BY MOUTH 3  TIMES DAILY 540 capsule 1   Calcium Carbonate-Vitamin D (CALCIUM 600+D PO) Take by mouth.     Lancet Devices (ONETOUCH DELICA PLUS LANCING) MISC USE AS DIRECTED 1 each 1   Spacer/Aero-Holding Chambers (AEROCHAMBER PLUS)  inhaler Use as instructed with MDI 1 each 2  No facility-administered medications prior to visit.    PAST MEDICAL HISTORY: Past Medical History:  Diagnosis Date   Acid reflux    Allergic rhinitis    Allergy    Anemia    Arthritis    per MD- pt states no s/s of this    Asthma    Bilateral sciatica    Cataract    removed both eyes    Chronic bilateral low back pain    Colon polyps    Diverticulosis    DM type 2 (diabetes mellitus, type 2) (HCC)    Environmental allergies    flowers, pollen, trees   GERD (gastroesophageal reflux disease)    Hyperlipidemia    Low back pain    Migraine    past hx- control as allergies are controlled    Osteoporosis    PONV (postoperative nausea and vomiting)     PAST SURGICAL HISTORY: Past Surgical History:  Procedure Laterality Date   CATARACT EXTRACTION, BILATERAL Bilateral 2012   CESAREAN SECTION     x 2   COLONOSCOPY     > 10 yrs ago- unsure if removed polyps as was told sev.different things after this colon    DENTAL SURGERY     molars removed    DILATION AND CURETTAGE OF UTERUS     NASAL SINUS SURGERY     TUBAL LIGATION      FAMILY HISTORY: Family History  Problem Relation Age of Onset   Asthma Father    Emphysema Father    Alcohol abuse Father    Diabetes Mellitus II Brother        x 2   Hyperlipidemia Brother    Hypertension Brother    Pancreatic cancer Brother    Prostate cancer Brother    Diabetes Brother    Heart disease Maternal Grandmother        hardening of the arteries   Heart murmur Daughter    Glaucoma Brother    Breast cancer Neg Hx    Colon cancer Neg Hx    Colon polyps Neg Hx    Esophageal cancer Neg Hx    Rectal cancer Neg Hx    Stomach cancer Neg Hx     SOCIAL HISTORY: Social History   Socioeconomic History   Marital status: Married    Spouse name: Not on file   Number of children: 2   Years of education: 16   Highest education level: Not on file  Occupational History   Occupation:  Retired  Tobacco Use   Smoking status: Never   Smokeless tobacco: Never  Vaping Use   Vaping Use: Never used  Substance and Sexual Activity   Alcohol use: No    Alcohol/week: 0.0 standard drinks   Drug use: No   Sexual activity: Not on file  Other Topics Concern   Not on file  Social History Narrative      Married 1 son one daughter, relocated to [place] after living in [place] for many years. 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   No caffeine.          Lives at home with her husband and brother.   Right-handed.   No caffeine use.   Social Determinants of Health   Financial Resource Strain: Low Risk    Difficulty of Paying Living Expenses: Not hard at all  Food Insecurity: No Food Insecurity   Worried About Charity fundraiser in the Last Year: Never true  Ran Out of Food in the Last Year: Never true  Transportation Needs: No Transportation Needs   Lack of Transportation (Medical): No   Lack of Transportation (Non-Medical): No  Physical Activity: Sufficiently Active   Days of Exercise per Week: 5 days   Minutes of Exercise per Session: 40 min  Stress: No Stress Concern Present   Feeling of Stress : Only a little  Social Connections: Moderately Isolated   Frequency of Communication with Friends and Family: More than three times a week   Frequency of Social Gatherings with Friends and Family: More than three times a week   Attends Religious Services: Never   Marine scientist or Organizations: No   Attends Archivist Meetings: Never   Marital Status: Married  Human resources officer Violence: Not At Risk   Fear of Current or Ex-Partner: No   Emotionally Abused: No   Physically Abused: No   Sexually Abused: No    PHYSICAL EXAM  Vitals:   05/25/22 1304  BP: 120/70  Pulse: 80  Weight: 135 lb (61.2 kg)  Height: $Remove'5\' 4"'IVCsbrD$  (1.626 m)    Body mass index is 23.17 kg/m.  Generalized: Well developed, in no acute distress   Neurological  examination  Mentation: Alert oriented to time, place, history taking. Follows all commands speech and language fluent Cranial nerve II-XII: Pupils were equal round reactive to light. Extraocular movements were full, visual field were full on confrontational test. Facial sensation and strength were normal. Head turning and shoulder shrug  were normal and symmetric. Motor: The motor testing reveals 5 over 5 strength of all 4 extremities. Good symmetric motor tone is noted throughout. Wearing right knee sleeve Sensory: Sensory testing is intact to soft touch on all 4 extremities. No evidence of extinction is noted.  Coordination: Cerebellar testing reveals good finger-nose-finger and heel-to-shin bilaterally. No tremor, very mild tremor with spiral draw Gait and station: Gait is normal, steady. Reflexes: Deep tendon reflexes are symmetric and normal bilaterally.   DIAGNOSTIC DATA (LABS, IMAGING, TESTING) - I reviewed patient records, labs, notes, testing and imaging myself where available.  Lab Results  Component Value Date   WBC 3.2 (L) 02/15/2022   HGB 13.7 02/15/2022   HCT 41.9 02/15/2022   MCV 92.9 02/15/2022   PLT 222.0 02/15/2022      Component Value Date/Time   NA 140 02/15/2022 1359   NA 140 04/12/2017 0000   K 4.8 02/15/2022 1359   CL 103 02/15/2022 1359   CO2 29 02/15/2022 1359   GLUCOSE 121 (H) 02/15/2022 1359   BUN 12 02/15/2022 1359   BUN 11 04/12/2017 0000   CREATININE 0.84 02/15/2022 1359   CALCIUM 10.0 02/15/2022 1359   PROT 7.5 02/15/2022 1359   ALBUMIN 4.4 02/15/2022 1359   AST 21 02/15/2022 1359   ALT 15 02/15/2022 1359   ALKPHOS 50 02/15/2022 1359   BILITOT 0.4 02/15/2022 1359   Lab Results  Component Value Date   CHOL 187 02/15/2022   HDL 72.40 02/15/2022   LDLCALC 96 02/15/2022   TRIG 93.0 02/15/2022   CHOLHDL 3 02/15/2022   Lab Results  Component Value Date   HGBA1C 6.9 05/18/2022   No results found for: [id number] Lab Results  Component Value  Date   TSH 1.13 02/15/2022   ASSESSMENT AND PLAN 76 y.o. year old female   1.  Chronic low back pain, radiating pain to bilateral lower extremities 2.  Acute right hip/pelvic pain -No significant pain, currently well  controlled -Continue gabapentin 600 mg up to 3 times daily -Encouraged to continue exercise, stretching -If pain worsens, consider repeat MRI lumbar spine or another ESI -Follow-up 1 year or sooner if needed  3.  Right hand tremor -Mild, seems consistent with essential tremor, monitor  Butler Denmark, AGNP-C, DNP 05/25/2022, 1:31 PM [facility] [address], [place], [place] [address] [phone]

## 2022-05-25 NOTE — Patient Instructions (Signed)
Continue gabapentin for pain control  Continue your exercise and stretching Call for any worsening of symptoms See you back in 1 year  Meds ordered this encounter  Medications   gabapentin (NEURONTIN) 300 MG capsule    Sig: TAKE 2 CAPSULES BY MOUTH 3  TIMES DAILY    Dispense:  540 capsule    Refill:  2

## 2022-05-26 ENCOUNTER — Ambulatory Visit: Payer: Medicare HMO

## 2022-05-27 ENCOUNTER — Ambulatory Visit: Payer: Medicare HMO

## 2022-05-27 DIAGNOSIS — E119 Type 2 diabetes mellitus without complications: Secondary | ICD-10-CM

## 2022-05-27 DIAGNOSIS — E1169 Type 2 diabetes mellitus with other specified complication: Secondary | ICD-10-CM

## 2022-05-27 NOTE — Chronic Care Management (AMB) (Signed)
Care Management    RN Visit Note  05/27/2022 Name: Kimberly Lam MRN: [id number] DOB: 21-Nov-1946  Subjective: Kimberly Lam is a 76 y.o. year old female who is a primary care patient of Kimberly Peng, NP. The care management team was consulted for assistance with disease management and care coordination needs.    Engaged with patient by telephone for follow up visit in response to provider referral for case management and/or care coordination services.   Consent to Services:   Kimberly Lam was given information about Care Management services today including:  Care Management services includes personalized support from designated clinical staff supervised by her physician, including individualized plan of care and coordination with other care providers 24/7 contact phone numbers for assistance for urgent and routine care needs. The patient may stop case management services at any time by phone call to the office staff.  Patient agreed to services and consent obtained.   Assessment: Review of patient past medical history, allergies, medications, health status, including review of consultants reports, laboratory and other test data, was performed as part of comprehensive evaluation and provision of chronic care management services.   SDOH (Social Determinants of Health) assessments and interventions performed:    Care Plan  Allergies  Allergen Reactions   Peanut-Containing Drug Products Anaphylaxis   Eggs Or Egg-Derived Products Nausea Only    Nausea only- can eat cakes with eggs    Milk-Related Compounds Nausea And Vomiting    Milk and cheese   Actos [Pioglitazone] Nausea Only   Soy Allergy Nausea Only    Pt states had allergy testing and was told allergic to soy-causes nausea but no other reactions   Vicodin [Hydrocodone-Acetaminophen] Nausea Only    Outpatient Encounter Medications as of 05/27/2022  Medication Sig Note   Accu-Chek FastClix Lancets MISC Use to check  blood glucose TID    Alcohol Swabs (B-D SINGLE USE SWABS REGULAR) PADS USE TO TEST BLOOD GLUCOSE FOUR TIMES DAILY    alendronate (FOSAMAX) 70 MG tablet TAKE 1 TABLET BY MOUTH  WEEKLY WITH 8 OZ OF PLAIN  WATER 30 MINUTES BEFORE  FIRST FOOD, DRINK OR MEDS.  STAY UPRIGHT FOR 30 MINS    Ascorbic Acid (VITAMIN C) 1000 MG tablet Take 1,000 mg by mouth as needed. 04/11/2019: Patient states that she takes it daily    azelastine (ASTELIN) 0.1 % nasal spray Place 2 sprays into both nostrils 2 (two) times daily.    Barberry-Oreg Grape-Goldenseal (BERBERINE COMPLEX PO) Take by mouth daily.    Blood Glucose Monitoring Suppl (ACCU-CHEK GUIDE) w/Device KIT 1 kit by Does not apply route as directed.    CINNAMON PO Take 1 tablet by mouth as needed.    Cranberry 450 MG CAPS Take 1 capsule by mouth as needed. 04/11/2019: Patient takes it daily    Cyanocobalamin (VITAMIN B-12 CR) 1500 MCG TBCR Take 1 tablet by mouth as needed. 04/11/2019: Patient takes once daily    Echinacea-Goldenseal (ECHINACEA COMB/GOLDEN SEAL PO) Take 1 tablet by mouth as needed. Reported on 12/16/2015 03/14/2019: PRN    EPINEPHrine 0.3 mg/0.3 mL IJ SOAJ injection Inject 0.3 mg into the muscle as needed for anaphylaxis.    fenoprofen (NALFON) 600 MG TABS tablet Take 600 mg by mouth 2 (two) times daily.    fluticasone (FLONASE) 50 MCG/ACT nasal spray USE 2 SPRAYS INTO BOTH NOSTRILS DAILY.    gabapentin (NEURONTIN) 300 MG capsule TAKE 2 CAPSULES BY MOUTH 3  TIMES DAILY    glipiZIDE (  GLUCOTROL XL) 5 MG 24 hr tablet Take 1 tablet (5 mg total) by mouth daily with breakfast.    glucose blood (ACCU-CHEK GUIDE) test strip Use as instructed    Green Tea, Camillia sinensis, (GREEN TEA PO) Take by mouth as needed. Reported on 12/16/2015 03/14/2019: PRN   ipratropium (ATROVENT) 0.03 % nasal spray Place into the nose.    Lactobacillus (ACIDOPHILUS PROBIOTIC PO) Take 1 tablet by mouth daily.    levocetirizine (XYZAL) 5 MG tablet TAKE 1 TABLET EVERY EVENING     MELATONIN ER PO Take by mouth.    metFORMIN (GLUCOPHAGE-XR) 500 MG 24 hr tablet Take 2 tablets (1,000 mg total) by mouth 2 (two) times daily.    montelukast (SINGULAIR) 10 MG tablet TAKE 1 TABLET AT BEDTIME    Omega-3 Fatty Acids (OMEGA 3 PO) Take 1 tablet by mouth as needed.    omeprazole (PRILOSEC) 40 MG capsule Take 40 mg by mouth daily.    ONETOUCH VERIO test strip USE TO TEST BLOOD GLUCOSE 4 TIMES DAILY    Polyethyl Glycol-Propyl Glycol (SYSTANE FREE OP) Apply 1 drop to eye daily. Uses thera work for dr eyes per pt    POTASSIUM PO Take by mouth.    ramipril (ALTACE) 2.5 MG capsule TAKE 1 CAPSULE BY MOUTH  DAILY    simvastatin (ZOCOR) 20 MG tablet Take 1 tablet (20 mg total) by mouth daily.    Stinging Nettle 2 % POWD Take 1 tablet by mouth as needed. Reported on 12/16/2015    traZODone (DESYREL) 50 MG tablet Take 0.5-1 tablets (25-50 mg total) by mouth at bedtime as needed. for sleep    TURMERIC PO Take 800 mg by mouth as needed. Reported on 12/16/2015 04/11/2019: Patient takes dialy   VENTOLIN HFA 108 (90 Base) MCG/ACT inhaler Inhale 2 puffs into the lungs every 6 (six) hours as needed for wheezing or shortness of breath.    No facility-administered encounter medications on file as of 05/27/2022.    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   Acute bacterial sinusitis 09/12/2016   Moderate persistent asthma, uncomplicated 45/36/4680   Perennial and seasonal allergic rhinoconjunctivitis 12/16/2015   Lumbar radiculopathy 11/25/2015    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  Care Plan : RN Care Manager  Plan of Care  Updates made by Dimitri Ped, RN since 05/27/2022 12:00 AM     Problem: Disease Management and Care Coordination Needs(DM, HLD)   Priority: High     Long-Range Goal: Establish Plan of Care for Care Coordination and Disease Management Needs   Start Date: 04/08/2022  Expected End Date: 04/08/2023  Priority: High  Note:   Current Barriers:  Knowledge Deficits related to plan of care for management of HLD and DMII  Chronic Disease Management support and education needs related to HLD and DMII   States her  sinus infection is better now .   States her CBGs range from 99-120 fasting and 143-210 post prandial.  Denies hypoglycemia  States she tries to eat healthy and likes vegetables.  States she is walking 1-3 miles 2-3  times a week but she does  not do any strength training  RNCM Clinical Goal(s):  Patient will verbalize understanding of plan for management of HLD and DMII as evidenced by voiced adherence to plan of care verbalize basic understanding of  HLD and DMII disease process and self health management plan as evidenced by voiced understanding and teach back  take all medications exactly as prescribed and will call provider for medication related questions as evidenced by dispense report and pt verbalization  attend all scheduled medical appointments: Kimberly Peng NP /23, Annual Wellness Visit 09/13/22 as evidenced by medical records demonstrate Improved adherence to prescribed treatment plan for HLD and DMII as evidenced by dispense report and pt verbalization  continue to work with RN Care Manager to address care management and care coordination needs related to  HLD and DMII as evidenced by adherence to CM Team Scheduled appointments through collaboration with RN Care manager, provider, and care team.   Interventions: 1:1 collaboration with primary care provider regarding development and update of comprehensive plan of care as evidenced by provider attestation and  co-signature Inter-disciplinary care team collaboration (see longitudinal plan of care) Evaluation of current treatment plan related to  self management and patient's adherence to plan as established by provider   Diabetes Interventions:  (Status:  Goal on track:  Yes.) Long Term Goal Assessed patient's understanding of A1c goal: <7% Provided education to patient about basic DM disease process Reviewed medications with patient and discussed importance of medication adherence Discussed plans with patient for ongoing care management follow up and provided patient with direct contact information for care management team Provided patient with written educational materials related to hypo and hyperglycemia and importance of correct treatment Advised patient, providing education and rationale, to check cbg 3-4 times a day and record, calling provider for findings outside established parameters Review of patient status, including review of consultants reports, relevant laboratory and other test results, and medications completed Reviewed to try to do resistance/strength training twice a week.  Reviewed Silver Sneakers benefit. Reviewed to try to eat fruits and vegetables that are in seasonal   Lab Results  Component Value Date   HGBA1C 6.9 05/18/2022  Hyperlipidemia Interventions:  (Status:  Goal on track:  Yes.) Long Term Goal Medication review performed; medication list updated in electronic medical record.  Counseled on importance of regular laboratory monitoring as prescribed Reviewed role and benefits of statin for ASCVD risk reduction Reviewed importance of limiting foods high in cholesterol Reviewed exercise goals and target of 150 minutes per week   Patient Goals/Self-Care Activities: Take all medications as prescribed Attend all scheduled provider appointments Call pharmacy for medication refills 3-7 days in advance of running out of medications Perform all self care activities  independently  Call provider office for new concerns or questions  keep appointment with eye doctor check blood sugar at prescribed times: three times daily and when you have symptoms of low or high blood sugar check feet daily for cuts, sores or redness take the blood sugar log to all doctor visits drink 6 to 8 glasses of water each day fill half of plate with vegetables limit fast food meals to no more than 1 per week manage portion size switch to sugar-free drinks call for medicine refill 2 or 3 days before it runs out take all medications exactly as prescribed call doctor with any symptoms you believe are related to your medicine  Follow Up Plan:  Telephone follow up appointment with care management team member scheduled for:  09/16/22 The  patient has been provided with contact information for the care management team and has been advised to call with any health related questions or concerns.       Plan: Telephone follow up appointment with care management team member scheduled for:  09/16/22 The patient has been provided with contact information for the care management team and has been advised to call with any health related questions or concerns.   Peter Garter RN, Jackquline [place], CDE Care Management Coordinator [facility] [phone]

## 2022-05-27 NOTE — Patient Instructions (Signed)
Visit Information  Thank you for taking time to visit with me today. Please don't hesitate to contact me if I can be of assistance to you before our next scheduled telephone appointment.  Following are the goals we discussed today:  Take all medications as prescribed Attend all scheduled provider appointments Call pharmacy for medication refills 3-7 days in advance of running out of medications Perform all self care activities independently  Call provider office for new concerns or questions  keep appointment with eye doctor check blood sugar at prescribed times: three times daily and when you have symptoms of low or high blood sugar check feet daily for cuts, sores or redness take the blood sugar log to all doctor visits drink 6 to 8 glasses of water each day fill half of plate with vegetables limit fast food meals to no more than 1 per week manage portion size switch to sugar-free drinks call for medicine refill 2 or 3 days before it runs out take all medications exactly as prescribed call doctor with any symptoms you believe are related to your medicine  Our next appointment is by telephone on 09/16/22 at 2:15 PM  Please call the care guide team at [phone] if you need to cancel or reschedule your appointment.   If you are experiencing a Mental Health or [place] or need someone to talk to, please call the Suicide and Crisis Lifeline: 988 call the [place] National Suicide Prevention Lifeline: 623-172-8554 or TTY: (947)862-0940 TTY 681 204 2336) to talk to a trained counselor call 1-800-273-TALK (toll free, 24 hour hotline) go to [facility] [address], [place] [phone]) call 911   Patient verbalizes understanding of instructions and care plan provided today and agrees to view in [place]. Active MyChart status and patient understanding of how to access instructions and care plan via MyChart confirmed with patient.      Telephone follow up appointment with care management team member scheduled for: 09/16/22 at 2:15 PM [place], Jackquline Denmark, CDE Care Management Coordinator [facility] [phone]

## 2022-06-01 ENCOUNTER — Ambulatory Visit (INDEPENDENT_AMBULATORY_CARE_PROVIDER_SITE_OTHER): Payer: Medicare HMO

## 2022-06-01 DIAGNOSIS — J309 Allergic rhinitis, unspecified: Secondary | ICD-10-CM | POA: Diagnosis not present

## 2022-06-09 ENCOUNTER — Ambulatory Visit: Payer: Medicare HMO | Admitting: Podiatry

## 2022-06-14 ENCOUNTER — Ambulatory Visit (INDEPENDENT_AMBULATORY_CARE_PROVIDER_SITE_OTHER): Payer: Medicare HMO

## 2022-06-14 DIAGNOSIS — J309 Allergic rhinitis, unspecified: Secondary | ICD-10-CM

## 2022-06-16 ENCOUNTER — Ambulatory Visit: Payer: Self-pay

## 2022-06-16 DIAGNOSIS — E119 Type 2 diabetes mellitus without complications: Secondary | ICD-10-CM

## 2022-06-16 NOTE — Chronic Care Management (AMB) (Signed)
 Care Management    RN Visit Note  06/16/2022 Name: Kimberly Lam MRN: [id number] DOB: 10/23/1946  Subjective: Kimberly Lam is a 75 y.o. year old female who is a primary care patient of Nafziger, Cory, NP. The care management team was consulted for assistance with disease management and care coordination needs.    Engaged with patient by telephone for  Case closed goals met  in response to provider referral for case management and/or care coordination services.   Consent to Services:   Kimberly Lam was given information about Care Management services today including:  Care Management services includes personalized support from designated clinical staff supervised by her physician, including individualized plan of care and coordination with other care providers 24/7 contact phone numbers for assistance for urgent and routine care needs. The patient may stop case management services at any time by phone call to the office staff.  Patient agreed to services and consent obtained.   Assessment: Review of patient past medical history, allergies, medications, health status, including review of consultants reports, laboratory and other test data, was performed as part of comprehensive evaluation and provision of chronic care management services.   SDOH (Social Determinants of Health) assessments and interventions performed:    Care Plan  Allergies  Allergen Reactions   Peanut-Containing Drug Products Anaphylaxis   Eggs Or Egg-Derived Products Nausea Only    Nausea only- can eat cakes with eggs    Milk-Related Compounds Nausea And Vomiting    Milk and cheese   Actos [Pioglitazone] Nausea Only   Soy Allergy Nausea Only    Pt states had allergy testing and was told allergic to soy-causes nausea but no other reactions   Vicodin [Hydrocodone-Acetaminophen] Nausea Only    Outpatient Encounter Medications as of 06/16/2022  Medication Sig Note   Accu-Chek FastClix Lancets MISC Use  to check blood glucose TID    Alcohol Swabs (B-D SINGLE USE SWABS REGULAR) PADS USE TO TEST BLOOD GLUCOSE FOUR TIMES DAILY    alendronate (FOSAMAX) 70 MG tablet TAKE 1 TABLET BY MOUTH  WEEKLY WITH 8 OZ OF PLAIN  WATER 30 MINUTES BEFORE  FIRST FOOD, DRINK OR MEDS.  STAY UPRIGHT FOR 30 MINS    Ascorbic Acid (VITAMIN C) 1000 MG tablet Take 1,000 mg by mouth as needed. 04/11/2019: Patient states that she takes it daily    azelastine (ASTELIN) 0.1 % nasal spray Place 2 sprays into both nostrils 2 (two) times daily.    Barberry-Oreg Grape-Goldenseal (BERBERINE COMPLEX PO) Take by mouth daily.    Blood Glucose Monitoring Suppl (ACCU-CHEK GUIDE) w/Device KIT 1 kit by Does not apply route as directed.    CINNAMON PO Take 1 tablet by mouth as needed.    Cranberry 450 MG CAPS Take 1 capsule by mouth as needed. 04/11/2019: Patient takes it daily    Cyanocobalamin (VITAMIN B-12 CR) 1500 MCG TBCR Take 1 tablet by mouth as needed. 04/11/2019: Patient takes once daily    Echinacea-Goldenseal (ECHINACEA COMB/GOLDEN SEAL PO) Take 1 tablet by mouth as needed. Reported on 12/16/2015 03/14/2019: PRN    EPINEPHrine 0.3 mg/0.3 mL IJ SOAJ injection Inject 0.3 mg into the muscle as needed for anaphylaxis.    fenoprofen (NALFON) 600 MG TABS tablet Take 600 mg by mouth 2 (two) times daily.    fluticasone (FLONASE) 50 MCG/ACT nasal spray USE 2 SPRAYS INTO BOTH NOSTRILS DAILY.    gabapentin (NEURONTIN) 300 MG capsule TAKE 2 CAPSULES BY MOUTH 3  TIMES DAILY      glipiZIDE (GLUCOTROL XL) 5 MG 24 hr tablet Take 1 tablet (5 mg total) by mouth daily with breakfast.    glucose blood (ACCU-CHEK GUIDE) test strip Use as instructed    Green Tea, Camillia sinensis, (GREEN TEA PO) Take by mouth as needed. Reported on 12/16/2015 03/14/2019: PRN   ipratropium (ATROVENT) 0.03 % nasal spray Place into the nose.    Lactobacillus (ACIDOPHILUS PROBIOTIC PO) Take 1 tablet by mouth daily.    levocetirizine (XYZAL) 5 MG tablet TAKE 1 TABLET EVERY  EVENING    MELATONIN ER PO Take by mouth.    metFORMIN (GLUCOPHAGE-XR) 500 MG 24 hr tablet Take 2 tablets (1,000 mg total) by mouth 2 (two) times daily.    montelukast (SINGULAIR) 10 MG tablet TAKE 1 TABLET AT BEDTIME    Omega-3 Fatty Acids (OMEGA 3 PO) Take 1 tablet by mouth as needed.    omeprazole (PRILOSEC) 40 MG capsule Take 40 mg by mouth daily.    ONETOUCH VERIO test strip USE TO TEST BLOOD GLUCOSE 4 TIMES DAILY    Polyethyl Glycol-Propyl Glycol (SYSTANE FREE OP) Apply 1 drop to eye daily. Uses thera work for dr eyes per pt    POTASSIUM PO Take by mouth.    ramipril (ALTACE) 2.5 MG capsule TAKE 1 CAPSULE BY MOUTH  DAILY    simvastatin (ZOCOR) 20 MG tablet Take 1 tablet (20 mg total) by mouth daily.    Stinging Nettle 2 % POWD Take 1 tablet by mouth as needed. Reported on 12/16/2015    traZODone (DESYREL) 50 MG tablet Take 0.5-1 tablets (25-50 mg total) by mouth at bedtime as needed. for sleep    TURMERIC PO Take 800 mg by mouth as needed. Reported on 12/16/2015 04/11/2019: Patient takes dialy   VENTOLIN HFA 108 (90 Base) MCG/ACT inhaler Inhale 2 puffs into the lungs every 6 (six) hours as needed for wheezing or shortness of breath.    No facility-administered encounter medications on file as of 06/16/2022.    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   Acute bacterial sinusitis 09/12/2016   Moderate persistent asthma, uncomplicated 12/16/2015   Perennial and seasonal allergic rhinoconjunctivitis 12/16/2015   Lumbar radiculopathy 11/25/2015    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  Care Plan : RN  Care Manager Plan of Care  Updates made by Sandlin, Melissa J, RN since 06/16/2022 12:00 AM  Completed 06/16/2022   Problem: Disease Management and Care Coordination Needs(DM, HLD) Resolved 06/16/2022  Priority: High     Long-Range Goal: Establish Plan of Care for Care Coordination and Disease Management Needs Completed 06/16/2022  Start Date: 04/08/2022  Expected End Date: 04/08/2023  Priority: High  Note:   Case closed goals met Current Barriers:  Knowledge Deficits related to plan of care for management of HLD and DMII  Chronic Disease Management support and education needs related to HLD and DMII   States her  sinus infection is better now .   States her CBGs range from 99-120 fasting and 143-210 post prandial.  Denies hypoglycemia  States she tries to eat healthy and likes vegetables.  States she is walking 1-3 miles   2-3  times a week but she does not do any strength training  RNCM Clinical Goal(s):  Patient will verbalize understanding of plan for management of HLD and DMII as evidenced by voiced adherence to plan of care verbalize basic understanding of  HLD and DMII disease process and self health management plan as evidenced by voiced understanding and teach back  take all medications exactly as prescribed and will call provider for medication related questions as evidenced by dispense report and pt verbalization  attend all scheduled medical appointments: Cory Nafziger NP /23, Annual Wellness Visit 09/13/22 as evidenced by medical records demonstrate Improved adherence to prescribed treatment plan for HLD and DMII as evidenced by dispense report and pt verbalization  continue to work with RN Care Manager to address care management and care coordination needs related to  HLD and DMII as evidenced by adherence to CM Team Scheduled appointments through collaboration with RN Care manager, provider, and care team.   Interventions: 1:1 collaboration with primary care provider regarding  development and update of comprehensive plan of care as evidenced by provider attestation and co-signature Inter-disciplinary care team collaboration (see longitudinal plan of care) Evaluation of current treatment plan related to  self management and patient's adherence to plan as established by provider   Diabetes Interventions:  (Status:  Goal Met.) Long Term Goal Assessed patient's understanding of A1c goal: <7% Provided education to patient about basic DM disease process Reviewed medications with patient and discussed importance of medication adherence Discussed plans with patient for ongoing care management follow up and provided patient with direct contact information for care management team Provided patient with written educational materials related to hypo and hyperglycemia and importance of correct treatment Advised patient, providing education and rationale, to check cbg 3-4 times a day and record, calling provider for findings outside established parameters Review of patient status, including review of consultants reports, relevant laboratory and other test results, and medications completed Reviewed to try to do resistance/strength training twice a week.  Reviewed Silver Sneakers benefit. Reviewed to try to eat fruits and vegetables that are in seasonal   Lab Results  Component Value Date   HGBA1C 6.9 05/18/2022  Hyperlipidemia Interventions:  (Status:  Goal Met.) Long Term Goal Medication review performed; medication list updated in electronic medical record.  Counseled on importance of regular laboratory monitoring as prescribed Reviewed role and benefits of statin for ASCVD risk reduction Reviewed importance of limiting foods high in cholesterol Reviewed exercise goals and target of 150 minutes per week   Patient Goals/Self-Care Activities: Take all medications as prescribed Attend all scheduled provider appointments Call pharmacy for medication refills 3-7 days in advance  of running out of medications Perform all self care activities independently  Call provider office for new concerns or questions  keep appointment with eye doctor check blood sugar at prescribed times: three times daily and when you have symptoms of low or high blood sugar check feet daily for cuts, sores or redness take the blood sugar log to all doctor visits drink 6 to 8 glasses of water each day fill half of plate with vegetables limit fast food meals to no more than 1 per week manage portion size switch to sugar-free drinks call for medicine refill 2 or 3 days before it runs out take all medications exactly as prescribed call doctor with any symptoms you believe are related to your medicine  Follow Up Plan:  The patient has been provided with contact information for the care management   team and has been advised to call with any health related questions or concerns.  No further follow up required: Case closed goals met       Plan: The patient has been provided with contact information for the care management team and has been advised to call with any health related questions or concerns.  No further follow up required: Case closed goals met  Melissa Sandlin RN, BSN,CCM, CDE Care Management Coordinator [facility] [phone]           

## 2022-06-16 NOTE — Patient Instructions (Signed)
Visit Information Case closed goals met Thank you for allowing me to share the care management and care coordination services that are available to you as part of your health plan and services through your primary care provider and medical home. Please reach out to me at [phone] if the care management/care coordination team may be of assistance to you in the future.   Debrah Granderson RN, BSN,CCM, CDE Care Management Coordinator [facility] [phone]   

## 2022-06-23 ENCOUNTER — Ambulatory Visit: Payer: Medicare HMO | Admitting: Podiatry

## 2022-06-23 DIAGNOSIS — B351 Tinea unguium: Secondary | ICD-10-CM

## 2022-06-23 DIAGNOSIS — M2042 Other hammer toe(s) (acquired), left foot: Secondary | ICD-10-CM

## 2022-06-23 DIAGNOSIS — M2041 Other hammer toe(s) (acquired), right foot: Secondary | ICD-10-CM | POA: Diagnosis not present

## 2022-06-23 DIAGNOSIS — E119 Type 2 diabetes mellitus without complications: Secondary | ICD-10-CM | POA: Diagnosis not present

## 2022-06-23 DIAGNOSIS — M79674 Pain in right toe(s): Secondary | ICD-10-CM | POA: Diagnosis not present

## 2022-06-23 DIAGNOSIS — J3089 Other allergic rhinitis: Secondary | ICD-10-CM | POA: Diagnosis not present

## 2022-06-23 DIAGNOSIS — M79675 Pain in left toe(s): Secondary | ICD-10-CM | POA: Diagnosis not present

## 2022-06-23 NOTE — Progress Notes (Signed)
  Subjective:  Patient ID: Kimberly Lam, female    DOB: 04-05-1946,  MRN: [id number]  Chief Complaint  Patient presents with   Nail Problem     diabetic nail trim/ possible a couple of ingrowns as well   76 y.o. female returns for the above complaint.  Patient presents with thickened elongated dystrophic toenails x10 mild pain on palpation.  Patient is a diabetic with last A1c was 6.9.  Patient would like me to debride the nails down.  She has secondary complaint of hammertoe contracture in setting of diabetes she is worried that she may develop ulceration and lead to amputation.  She wanted to discuss diabetic shoes.  Objective:  There were no vitals filed for this visit. Podiatric Exam: Vascular: dorsalis pedis and posterior tibial pulses are palpable bilateral. Capillary return is immediate. Temperature gradient is WNL. Skin turgor WNL  Sensorium: Normal Semmes Weinstein monofilament test. Normal tactile sensation bilaterally. Nail Exam: Pt has thick disfigured discolored nails with subungual debris noted bilateral entire nail hallux through fifth toenails.  Pain on palpation to the nails.  Hammertoe contractures semiflexible noted to 2 through 5 bilaterally Ulcer Exam: There is no evidence of ulcer or pre-ulcerative changes or infection. Orthopedic Exam: Muscle tone and strength are WNL. No limitations in general ROM. No crepitus or effusions noted.  Skin: No Porokeratosis. No infection or ulcers    Assessment & Plan:  No diagnosis found.  Patient was evaluated and treated and all questions answered.  Hammertoes bilaterally -I explained the patient the etiology of hammertoes and worse treatment options were discussed.  Given the amount of contracture that is present I believe she will benefit from diabetic shoes.  She was scheduled with me nursing staff to be cysts casted for diabetic shoes.  Onychomycosis with pain  -Nails palliatively debrided as below. -Educated on  self-care  Procedure: Nail Debridement Rationale: pain  Type of Debridement: manual, sharp debridement. Instrumentation: Nail nipper, rotary burr. Number of Nails: 10  Procedures and Treatment: Consent by patient was obtained for treatment procedures. The patient understood the discussion of treatment and procedures well. All questions were answered thoroughly reviewed. Debridement of mycotic and hypertrophic toenails, 1 through 5 bilateral and clearing of subungual debris. No ulceration, no infection noted.  Return Visit-Office Procedure: Patient instructed to return to the office for a follow up visit 3 months for continued evaluation and treatment.  Boneta Lucks, DPM    Return if symptoms worsen or fail to improve.

## 2022-06-23 NOTE — Progress Notes (Signed)
VIALS EXP 06-24-23 

## 2022-06-24 DIAGNOSIS — J301 Allergic rhinitis due to pollen: Secondary | ICD-10-CM | POA: Diagnosis not present

## 2022-06-27 DIAGNOSIS — J302 Other seasonal allergic rhinitis: Secondary | ICD-10-CM | POA: Diagnosis not present

## 2022-06-28 ENCOUNTER — Ambulatory Visit (INDEPENDENT_AMBULATORY_CARE_PROVIDER_SITE_OTHER): Payer: Medicare HMO

## 2022-06-28 DIAGNOSIS — J309 Allergic rhinitis, unspecified: Secondary | ICD-10-CM | POA: Diagnosis not present

## 2022-07-04 ENCOUNTER — Emergency Department (HOSPITAL_COMMUNITY): Payer: Medicare HMO

## 2022-07-04 ENCOUNTER — Emergency Department (HOSPITAL_COMMUNITY)
Admission: EM | Admit: 2022-07-04 | Discharge: 2022-07-05 | Disposition: A | Discharge status: Home / Self Care | Payer: Medicare HMO | Attending: Emergency Medicine | Admitting: Emergency Medicine

## 2022-07-04 ENCOUNTER — Other Ambulatory Visit: Payer: Self-pay

## 2022-07-04 ENCOUNTER — Ambulatory Visit (HOSPITAL_COMMUNITY)
Admission: EM | Admit: 2022-07-04 | Discharge: 2022-07-04 | Disposition: A | Discharge status: Home / Self Care | Payer: Medicare HMO | Attending: Family Medicine | Admitting: Family Medicine

## 2022-07-04 ENCOUNTER — Encounter (HOSPITAL_COMMUNITY): Payer: Self-pay | Admitting: Emergency Medicine

## 2022-07-04 DIAGNOSIS — E1159 Type 2 diabetes mellitus with other circulatory complications: Secondary | ICD-10-CM

## 2022-07-04 DIAGNOSIS — Z7984 Long term (current) use of oral hypoglycemic drugs: Secondary | ICD-10-CM | POA: Insufficient documentation

## 2022-07-04 DIAGNOSIS — E119 Type 2 diabetes mellitus without complications: Secondary | ICD-10-CM | POA: Diagnosis not present

## 2022-07-04 DIAGNOSIS — R112 Nausea with vomiting, unspecified: Secondary | ICD-10-CM

## 2022-07-04 DIAGNOSIS — Z91010 Allergy to peanuts: Secondary | ICD-10-CM | POA: Insufficient documentation

## 2022-07-04 DIAGNOSIS — R079 Chest pain, unspecified: Secondary | ICD-10-CM

## 2022-07-04 DIAGNOSIS — R1010 Upper abdominal pain, unspecified: Secondary | ICD-10-CM | POA: Diagnosis not present

## 2022-07-04 DIAGNOSIS — R140 Abdominal distension (gaseous): Secondary | ICD-10-CM | POA: Insufficient documentation

## 2022-07-04 DIAGNOSIS — R7989 Other specified abnormal findings of blood chemistry: Secondary | ICD-10-CM | POA: Insufficient documentation

## 2022-07-04 DIAGNOSIS — R0789 Other chest pain: Secondary | ICD-10-CM | POA: Insufficient documentation

## 2022-07-04 DIAGNOSIS — R531 Weakness: Secondary | ICD-10-CM

## 2022-07-04 DIAGNOSIS — R1110 Vomiting, unspecified: Secondary | ICD-10-CM | POA: Diagnosis not present

## 2022-07-04 DIAGNOSIS — R1011 Right upper quadrant pain: Secondary | ICD-10-CM | POA: Diagnosis not present

## 2022-07-04 DIAGNOSIS — K760 Fatty (change of) liver, not elsewhere classified: Secondary | ICD-10-CM | POA: Diagnosis not present

## 2022-07-04 LAB — TROPONIN I (HIGH SENSITIVITY)
Troponin I (High Sensitivity): 3 ng/L (ref <18)
Troponin I (High Sensitivity): 3 ng/L (ref <18)

## 2022-07-04 LAB — CBC
HCT: 41 % (ref 36.0–46.0)
Hemoglobin: 13.5 g/dL (ref 12.0–15.0)
MCH: 29.9 pg (ref 26.0–34.0)
MCHC: 32.9 g/dL (ref 30.0–36.0)
MCV: 90.9 fL (ref 80.0–100.0)
Platelets: 255 10*3/uL (ref 150–400)
RBC: 4.51 MIL/uL (ref 3.87–5.11)
RDW: 13.5 % (ref 11.5–15.5)
WBC: 6.9 10*3/uL (ref 4.0–10.5)
nRBC: 0 % (ref 0.0–0.2)

## 2022-07-04 LAB — COMPREHENSIVE METABOLIC PANEL
ALT: 38 U/L (ref 0–44)
AST: 61 U/L — ABNORMAL HIGH (ref 15–41)
Albumin: 3.7 g/dL (ref 3.5–5.0)
Alkaline Phosphatase: 64 U/L (ref 38–126)
Anion gap: 12 (ref 5–15)
BUN: 13 mg/dL (ref 8–23)
CO2: 22 mmol/L (ref 22–32)
Calcium: 9.2 mg/dL (ref 8.9–10.3)
Chloride: 100 mmol/L (ref 98–111)
Creatinine, Ser: 0.9 mg/dL (ref 0.44–1.00)
GFR, Estimated: >60 mL/min (ref >60)
Glucose, Bld: 171 mg/dL — ABNORMAL HIGH (ref 70–99)
Potassium: 4 mmol/L (ref 3.5–5.1)
Sodium: 134 mmol/L — ABNORMAL LOW (ref 135–145)
Total Bilirubin: 0.5 mg/dL (ref 0.3–1.2)
Total Protein: 8.9 g/dL — ABNORMAL HIGH (ref 6.5–8.1)

## 2022-07-04 LAB — LIPASE, BLOOD: Lipase: 34 U/L (ref 11–51)

## 2022-07-04 LAB — CBG MONITORING, ED: Glucose-Capillary: 116 mg/dL — ABNORMAL HIGH (ref 70–99)

## 2022-07-04 NOTE — ED Notes (Signed)
Patient is being discharged from the Urgent Care and sent to the Emergency Department via POV . Per Lavell Anchors, NP, patient is in need of higher level of care due to chest  pain, nausea, vomiting episode. Patient is aware and verbalizes understanding of plan of care.  Vitals:   07/04/22 1532  BP: 128/60  Pulse: 71  Resp: 20  Temp: 97.7 F (36.5 C)  SpO2: 100%

## 2022-07-04 NOTE — ED Notes (Signed)
Lavell Anchors, np at bedside

## 2022-07-04 NOTE — ED Triage Notes (Signed)
Pt endorses CP while shopping a few hours ago. Pain started in the middle and spread all over her chest that caused her to vomit. Denies CP at this time. Denies previous cardiac hx.

## 2022-07-04 NOTE — ED Provider Notes (Signed)
[place]    CSN: [id number] Arrival date & time: 07/04/22  1512      History   Chief Complaint Chief Complaint  Patient presents with   Chest Pain    HPI Kimberly Lam is a 76 y.o. female.   HPI Patient with a history of diabetes, chronic back pain, anemia presents today with acute onset chest pain which occurred about 2 hours ago with shortness of breath, 1 episode of nausea and vomiting, followed by sweating.  She reports chest pain has resolved upon arrival here to urgent care.  She has no history of any known cardiac disease.  She has no EKG on file.  Reports checking her blood sugar today and it was 104 prior to eating.  On arrival here is 116.  She endorses feeling generally weak.  She has not recently been ill.  She still having some mild shortness of breath with exertion.  She has no leg swelling or abdominal distention.  No history of any hypercoagulable conditions.  Past Medical History:  Diagnosis Date   Acid reflux    Allergic rhinitis    Allergy    Anemia    Arthritis    per MD- pt states no s/s of this    Asthma    Bilateral sciatica    Cataract    removed both eyes    Chronic bilateral low back pain    Colon polyps    Diverticulosis    DM type 2 (diabetes mellitus, type 2) (HCC)    Environmental allergies    flowers, pollen, trees   GERD (gastroesophageal reflux disease)    Hyperlipidemia    Low back pain    Migraine    past hx- control as allergies are controlled    Osteoporosis    PONV (postoperative nausea and vomiting)     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   Right lumbar radiculopathy 01/23/2018   Gastroesophageal reflux disease 09/14/2017   Diabetes mellitus without complication ([place]) 46/65/9935   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/16/2017   Acute bacterial sinusitis 09/12/2016   Moderate persistent asthma, uncomplicated 70/17/7939   Perennial and seasonal allergic rhinoconjunctivitis 12/16/2015   Lumbar radiculopathy 11/25/2015    Past Surgical History:  Procedure Laterality Date   CATARACT EXTRACTION, BILATERAL Bilateral 2012   CESAREAN SECTION     x 2   COLONOSCOPY     > 10 yrs ago- unsure if removed polyps as was told sev.different things after this colon    DENTAL SURGERY     molars removed    DILATION AND CURETTAGE OF UTERUS     NASAL SINUS SURGERY     TUBAL LIGATION      OB History     Gravida  0   Para  0   Term  0   Preterm  0   AB  0   Living         SAB  0   IAB  0   Ectopic  0   Multiple      Live Births               Home Medications    Prior to Admission medications   Medication Sig Start Date End Date Taking? Authorizing Provider  Accu-Chek  FastClix Lancets MISC Use to check blood glucose TID 05/18/22   Nafziger, Tommi Rumps, NP  Alcohol Swabs (B-D SINGLE USE SWABS REGULAR) PADS USE TO TEST BLOOD GLUCOSE FOUR TIMES DAILY 05/18/22   Nafziger, Tommi Rumps, NP  alendronate (FOSAMAX) 70 MG tablet TAKE 1 TABLET BY MOUTH  WEEKLY WITH 8 OZ OF PLAIN  WATER 30 MINUTES BEFORE  FIRST FOOD, DRINK OR MEDS.  STAY UPRIGHT FOR 30 MINS 12/29/21   Nafziger, Tommi Rumps, NP  Ascorbic Acid (VITAMIN C) 1000 MG tablet Take 1,000 mg by mouth as needed.    [provider]  azelastine (ASTELIN) 0.1 % nasal spray Place 2 sprays into both nostrils 2 (two) times daily. 04/15/22   Ambs, Kathrine Cords, FNP  Barberry-Oreg Grape-Goldenseal (BERBERINE COMPLEX PO) Take by mouth daily.    [provider]  Blood Glucose Monitoring Suppl (ACCU-CHEK GUIDE) w/Device KIT 1 kit by Does not apply route as directed. 01/10/22   Nafziger, Tommi Rumps, NP  CINNAMON PO Take 1 tablet by mouth as needed.    [provider]  Cranberry 450 MG CAPS Take 1 capsule by mouth as needed.    [provider]   Cyanocobalamin (VITAMIN B-12 CR) 1500 MCG TBCR Take 1 tablet by mouth as needed.    [provider]  Echinacea-Goldenseal (ECHINACEA COMB/GOLDEN SEAL PO) Take 1 tablet by mouth as needed. Reported on 12/16/2015    [provider]  EPINEPHrine 0.3 mg/0.3 mL IJ SOAJ injection Inject 0.3 mg into the muscle as needed for anaphylaxis. 12/07/21   Kozlow, Donnamarie Poag, MD  fenoprofen (NALFON) 600 MG TABS tablet Take 600 mg by mouth 2 (two) times daily.    [provider]  fluticasone (FLONASE) 50 MCG/ACT nasal spray USE 2 SPRAYS INTO BOTH NOSTRILS DAILY. 04/21/22   Dara Hoyer, FNP  gabapentin (NEURONTIN) 300 MG capsule TAKE 2 CAPSULES BY MOUTH 3  TIMES DAILY 05/25/22   Suzzanne Cloud, NP  glipiZIDE (GLUCOTROL XL) 5 MG 24 hr tablet Take 1 tablet (5 mg total) by mouth daily with breakfast. 02/16/22   Nafziger, Tommi Rumps, NP  glucose blood (ACCU-CHEK GUIDE) test strip Use as instructed 05/18/22   Nafziger, Tommi Rumps, NP  Green Tea, Camillia sinensis, (GREEN TEA PO) Take by mouth as needed. Reported on 12/16/2015    [provider]  ipratropium (ATROVENT) 0.03 % nasal spray Place into the nose. 07/05/19   [provider]  Lactobacillus (ACIDOPHILUS PROBIOTIC PO) Take 1 tablet by mouth daily.    [provider]  levocetirizine (XYZAL) 5 MG tablet TAKE 1 TABLET EVERY EVENING 03/21/22   Garnet Sierras, DO  MELATONIN ER PO Take by mouth.    [provider]  metFORMIN (GLUCOPHAGE-XR) 500 MG 24 hr tablet Take 2 tablets (1,000 mg total) by mouth 2 (two) times daily. 12/29/21   Nafziger, Tommi Rumps, NP  montelukast (SINGULAIR) 10 MG tablet TAKE 1 TABLET AT BEDTIME 03/21/22   Garnet Sierras, DO  Omega-3 Fatty Acids (OMEGA 3 PO) Take 1 tablet by mouth as needed.    [provider]  omeprazole (PRILOSEC) 40 MG capsule Take 40 mg by mouth daily.    [provider]  [facility] VERIO test strip USE TO TEST BLOOD GLUCOSE 4 TIMES DAILY 12/30/20   Nafziger, Tommi Rumps, NP  Polyethyl  Glycol-Propyl Glycol (SYSTANE FREE OP) Apply 1 drop to eye daily. Uses thera work for dr eyes per pt    [provider]  POTASSIUM PO Take by mouth.    [provider]  ramipril (ALTACE) 2.5 MG capsule TAKE 1 CAPSULE BY MOUTH  DAILY 09/02/21   Nafziger, Tommi Rumps, NP  simvastatin (ZOCOR) 20 MG tablet Take 1 tablet (20 mg total) by mouth daily. 12/29/21   Nafziger, Tommi Rumps, NP  Stinging Nettle 2 % POWD Take 1 tablet by mouth as needed. Reported on 12/16/2015    [provider]  traZODone (DESYREL) 50 MG tablet Take 0.5-1 tablets (25-50 mg total) by mouth at bedtime as needed. for sleep 12/29/21   [name], NP  TURMERIC PO Take 800 mg by mouth as needed. Reported on 12/16/2015    [provider]  VENTOLIN HFA 108 (90 Base) MCG/ACT inhaler Inhale 2 puffs into the lungs every 6 (six) hours as needed for wheezing or shortness of breath. 12/07/21   Kozlow, Donnamarie Poag, MD    Family History Family History  Problem Relation Age of Onset   Asthma Father    Emphysema Father    Alcohol abuse Father    Diabetes Mellitus II Brother        x 2   Hyperlipidemia Brother    Hypertension Brother    Pancreatic cancer Brother    Prostate cancer Brother    Diabetes Brother    Heart disease Maternal Grandmother        hardening of the arteries   Heart murmur Daughter    Glaucoma Brother    Breast cancer Neg Hx    Colon cancer Neg Hx    Colon polyps Neg Hx    Esophageal cancer Neg Hx    Rectal cancer Neg Hx    Stomach cancer Neg Hx     Social History Social History   Tobacco Use   Smoking status: Never   Smokeless tobacco: Never  Vaping Use   Vaping Use: Never used  Substance Use Topics   Alcohol use: No    Alcohol/week: 0.0 standard drinks of alcohol   Drug use: No     Allergies   Peanut-containing drug products, Eggs or egg-derived products, Milk-related compounds, Actos [pioglitazone], Soy allergy, and Vicodin [hydrocodone-acetaminophen]   Review of  Systems Review of Systems Pertinent negatives listed in HPI   Physical Exam Triage Vital Signs ED Triage Vitals  Enc Vitals Group     BP 07/04/22 1532 128/60     Pulse Rate 07/04/22 1532 71     Resp 07/04/22 1532 20     Temp 07/04/22 1532 97.7 F (36.5 C)     Temp Source 07/04/22 1532 Oral     SpO2 07/04/22 1532 100 %     Weight --      Height --      Head Circumference --      Peak Flow --      Pain Score 07/04/22 1528 0     Pain Loc --      Pain Edu? --      Excl. in [place]? --    No data found.  Updated Vital Signs BP 128/60 (BP Location: Right Arm)   Pulse 71   Temp 97.7 F (36.5 C) (Oral)   Resp 20   SpO2 100%   Visual Acuity Right Eye Distance:   Left Eye Distance:   Bilateral Distance:    Right Eye Near:   Left Eye Near:    Bilateral Near:     Physical Exam Constitutional:      Appearance: She is not toxic-appearing.     Comments: Thin appearance  HENT:  Head: Normocephalic.  Eyes:     Extraocular Movements: Extraocular movements intact.     Pupils: Pupils are equal, round, and reactive to light.  Cardiovascular:     Rate and Rhythm: Normal rate and regular rhythm.  Pulmonary:     Effort: Pulmonary effort is normal. Tachypnea present.  Abdominal:     Palpations: Abdomen is soft.     Comments: Negative for abdominal distention  Musculoskeletal:        General: Normal range of motion.     Cervical back: Normal range of motion.  Skin:    General: Skin is warm and dry.     Capillary Refill: Capillary refill takes less than 2 seconds.  Neurological:     General: No focal deficit present.     Mental Status: She is alert and oriented to person, place, and time.  Psychiatric:        Mood and Affect: Mood normal.      UC Treatments / Results  Labs (all labs ordered are listed, but only abnormal results are displayed) Labs Reviewed  CBG MONITORING, ED - Abnormal; Notable for the following components:      Result Value   Glucose-Capillary 116  (*)    All other components within normal limits    EKG Normal Sinus rhythm rate 69 no ST changes Radiology No results found.  Procedures Procedures (including critical care time)  Medications Ordered in UC Medications - No data to display  Initial Impression / Assessment and Plan / UC Course  I have reviewed the triage vital signs and the nursing notes.  Pertinent labs & imaging results that were available during my care of the patient were reviewed by me and considered in my medical decision making (see chart for details).    Nonspecific chest pain with generalized weakness and nausea and vomiting EKG normal sinus rhythm with a rate of 69 with no ST changes however given patient's risk factors for cardiac event given history of diabetes, age, African-American female, patient warrants further evaluation in the setting the ER with cardiac enzymes and any additional diagnostic testing warranted.  Given the limitations of urgent care patient is being directed to follow-up at [facility] emergency department.  She is stable for transport by personal vehicle she is accompanied by her spouse.  Patient and spouse verbalized understanding and agreement with plan. Final Clinical Impressions(s) / UC Diagnoses   Final diagnoses:  Nonspecific chest pain  Generalized weakness  Nausea and vomiting, unspecified vomiting type     Discharge Instructions      Recommend further work-up and evaluation due to the onset of chest pain and shortness of breath.  Recommended further diagnostic testing such as cardiac enzymes and possible with advanced imaging of the chest to rule out other causes of symptoms.     ED Prescriptions   None    PDMP not reviewed this encounter.   Scot Jun, FNP 07/04/22 [phone]

## 2022-07-04 NOTE — Discharge Instructions (Signed)
Recommend further work-up and evaluation due to the onset of chest pain and shortness of breath.  Recommended further diagnostic testing such as cardiac enzymes and possible with advanced imaging of the chest to rule out other causes of symptoms.

## 2022-07-04 NOTE — ED Provider Triage Note (Signed)
Emergency Medicine Provider Triage Evaluation Note  Kimberly Lam , a 76 y.o. female  was evaluated in triage.  Pt complains of chest pain around 1300/1400. She reports it last around 2 hours. She reports that it was tight and was pressure. She felt nauseous and started vomiting, she reports that the more she vomited, the less pressure she felt on her chest. She reports that she does not have any chest pain now. Unsure if she had SOB. Denies any belly pain. Reports generalized weakness.   Review of Systems  Positive:  Negative:   Physical Exam  BP (!) 103/57 (BP Location: Right Arm)   Pulse 69   Temp 98.6 F (37 C) (Oral)   Resp 16   Ht '5\' 4"'$  (1.626 m)   Wt 61 kg   SpO2 98%   BMI 23.08 kg/m  Gen:   Awake, no distress   Resp:  Normal effort  MSK:   Moves extremities without difficulty  Other:  RRR  Medical Decision Making  Medically screening exam initiated at 5:47 PM.  Appropriate orders placed.  Kimberly Lam was informed that the remainder of the evaluation will be completed by another provider, this initial triage assessment does not replace that evaluation, and the importance of remaining in the ED until their evaluation is complete.  Will order cardiac workup.    Sherrell Puller, PA-C 07/04/22 1750

## 2022-07-04 NOTE — ED Triage Notes (Signed)
Reports having chest pain and vomiting approximately 2 hours ago.  Symptoms started while in store.  Patient felt very weak.  After vomiting, chest pain improved.  Patient broke out in a heavy sweat.  Patient admits to sob.  Pain in chest , felt like chest was "swelling".  Patient has never felt this way before Last meal was breakfast around 10:00-11:00

## 2022-07-04 NOTE — ED Notes (Signed)
Kimberly Lam , np at bedside for cbg reading

## 2022-07-05 ENCOUNTER — Emergency Department (HOSPITAL_COMMUNITY): Payer: Medicare HMO

## 2022-07-05 DIAGNOSIS — R1011 Right upper quadrant pain: Secondary | ICD-10-CM | POA: Diagnosis not present

## 2022-07-05 DIAGNOSIS — K760 Fatty (change of) liver, not elsewhere classified: Secondary | ICD-10-CM | POA: Diagnosis not present

## 2022-07-05 MED ORDER — SUCRALFATE 1 GM/10ML PO SUSP: 1.0000 g | Freq: Three times a day (TID) | ORAL | 0 refills | Status: DC

## 2022-07-05 MED ORDER — ALUM & MAG HYDROXIDE-SIMETH 200-200-20 MG/5ML PO SUSP
30.0000 mL | Freq: Once | ORAL | Status: AC
  Administered 2022-07-05: 30 mL via ORAL
  Filled 2022-07-05: qty 30

## 2022-07-05 MED ORDER — ONDANSETRON 4 MG PO TBDP: 4.0000 mg | ORAL_TABLET | Freq: Three times a day (TID) | ORAL | 0 refills | Status: DC | PRN

## 2022-07-05 NOTE — ED Provider Notes (Signed)
[facility] EMERGENCY DEPARTMENT Provider Note   CSN: [id number] Arrival date & time: 07/04/22  3810     History  Chief Complaint  Patient presents with   Chest Pain   Emesis    Lanissa Cashen is a 76 y.o. female.  HPI 76 year old female presents with chest pain and vomiting.  Around 12 PM she developed a feeling like her chest was being bloated and swollen and then she started vomiting.  She was sweating during this.  Prior to that she had 1 soft bowel movement.  Since then she has had on and off mild chest discomfort but no further vomiting.  However her stomach feels upset.  Feels like there is some abdominal discomfort.  No shortness of breath or leg swelling.  Right now she feels generally weak.  She has been in the waiting room for 15+ hours. No urinary symptoms.  Has a history pertinent for acid reflux, hyperlipidemia, type 2 diabetes.  Home Medications Prior to Admission medications   Medication Sig Start Date End Date Taking? Authorizing Provider  Accu-Chek FastClix Lancets MISC Use to check blood glucose TID 05/18/22   Nafziger, Tommi Rumps, NP  Alcohol Swabs (B-D SINGLE USE SWABS REGULAR) PADS USE TO TEST BLOOD GLUCOSE FOUR TIMES DAILY 05/18/22   Nafziger, Tommi Rumps, NP  alendronate (FOSAMAX) 70 MG tablet TAKE 1 TABLET BY MOUTH  WEEKLY WITH 8 OZ OF PLAIN  WATER 30 MINUTES BEFORE  FIRST FOOD, DRINK OR MEDS.  STAY UPRIGHT FOR 30 MINS 12/29/21   Nafziger, Tommi Rumps, NP  Ascorbic Acid (VITAMIN C) 1000 MG tablet Take 1,000 mg by mouth as needed.    [provider]  azelastine (ASTELIN) 0.1 % nasal spray Place 2 sprays into both nostrils 2 (two) times daily. 04/15/22   Ambs, Kathrine Cords, FNP  Barberry-Oreg Grape-Goldenseal (BERBERINE COMPLEX PO) Take by mouth daily.    [provider]  Blood Glucose Monitoring Suppl (ACCU-CHEK GUIDE) w/Device KIT 1 kit by Does not apply route as directed. 01/10/22   Nafziger, Tommi Rumps, NP  CINNAMON PO Take 1 tablet by mouth as needed.     [provider]  Cranberry 450 MG CAPS Take 1 capsule by mouth as needed.    [provider]  Cyanocobalamin (VITAMIN B-12 CR) 1500 MCG TBCR Take 1 tablet by mouth as needed.    [provider]  Echinacea-Goldenseal (ECHINACEA COMB/GOLDEN SEAL PO) Take 1 tablet by mouth as needed. Reported on 12/16/2015    [provider]  EPINEPHrine 0.3 mg/0.3 mL IJ SOAJ injection Inject 0.3 mg into the muscle as needed for anaphylaxis. 12/07/21   Kozlow, Donnamarie Poag, MD  fenoprofen (NALFON) 600 MG TABS tablet Take 600 mg by mouth 2 (two) times daily.    [provider]  fluticasone (FLONASE) 50 MCG/ACT nasal spray USE 2 SPRAYS INTO BOTH NOSTRILS DAILY. 04/21/22   Dara Hoyer, FNP  gabapentin (NEURONTIN) 300 MG capsule TAKE 2 CAPSULES BY MOUTH 3  TIMES DAILY 05/25/22   Suzzanne Cloud, NP  glipiZIDE (GLUCOTROL XL) 5 MG 24 hr tablet Take 1 tablet (5 mg total) by mouth daily with breakfast. 02/16/22   Nafziger, Tommi Rumps, NP  glucose blood (ACCU-CHEK GUIDE) test strip Use as instructed 05/18/22   Nafziger, Tommi Rumps, NP  Green Tea, Camillia sinensis, (GREEN TEA PO) Take by mouth as needed. Reported on 12/16/2015    [provider]  ipratropium (ATROVENT) 0.03 % nasal spray Place into the nose. 07/05/19   [provider]  Lactobacillus (ACIDOPHILUS PROBIOTIC PO) Take 1 tablet by mouth daily.    [provider]  levocetirizine (XYZAL) 5 MG tablet TAKE 1 TABLET EVERY EVENING 03/21/22   Garnet Sierras, DO  MELATONIN ER PO Take by mouth.    [provider]  metFORMIN (GLUCOPHAGE-XR) 500 MG 24 hr tablet Take 2 tablets (1,000 mg total) by mouth 2 (two) times daily. 12/29/21   Nafziger, Tommi Rumps, NP  montelukast (SINGULAIR) 10 MG tablet TAKE 1 TABLET AT BEDTIME 03/21/22   Garnet Sierras, DO  Omega-3 Fatty Acids (OMEGA 3 PO) Take 1 tablet by mouth as needed.    [provider]  omeprazole (PRILOSEC) 40 MG capsule Take 40 mg by mouth daily.    [provider]   [facility] VERIO test strip USE TO TEST BLOOD GLUCOSE 4 TIMES DAILY 12/30/20   Nafziger, Tommi Rumps, NP  Polyethyl Glycol-Propyl Glycol (SYSTANE FREE OP) Apply 1 drop to eye daily. Uses thera work for dr eyes per pt    [provider]  POTASSIUM PO Take by mouth.    [provider]  ramipril (ALTACE) 2.5 MG capsule TAKE 1 CAPSULE BY MOUTH  DAILY 09/02/21   Nafziger, Tommi Rumps, NP  simvastatin (ZOCOR) 20 MG tablet Take 1 tablet (20 mg total) by mouth daily. 12/29/21   Nafziger, Tommi Rumps, NP  Stinging Nettle 2 % POWD Take 1 tablet by mouth as needed. Reported on 12/16/2015    [provider]  traZODone (DESYREL) 50 MG tablet Take 0.5-1 tablets (25-50 mg total) by mouth at bedtime as needed. for sleep 12/29/21   [name], NP  TURMERIC PO Take 800 mg by mouth as needed. Reported on 12/16/2015    [provider]  VENTOLIN HFA 108 (90 Base) MCG/ACT inhaler Inhale 2 puffs into the lungs every 6 (six) hours as needed for wheezing or shortness of breath. 12/07/21   Kozlow, Donnamarie Poag, MD      Allergies    Peanut-containing drug products, Eggs or egg-derived products, Milk-related compounds, Actos [pioglitazone], Soy allergy, and Vicodin [hydrocodone-acetaminophen]    Review of Systems   Review of Systems  Constitutional:  Positive for diaphoresis. Negative for fever.  Respiratory:  Negative for shortness of breath.   Cardiovascular:  Positive for chest pain. Negative for leg swelling.  Gastrointestinal:  Positive for abdominal pain, nausea and vomiting.    Physical Exam Updated Vital Signs BP (!) 149/76 (BP Location: Left Arm)   Pulse 70   Temp 97.8 F (36.6 C) (Oral)   Resp 16   Ht _0  (1.626 m)   Wt 61 kg   SpO2 98%   BMI 23.08 kg/m  Physical Exam Vitals and nursing note reviewed.  Constitutional:      General: She is not in acute distress.    Appearance: She is well-developed. She is not ill-appearing or diaphoretic.  HENT:     Head: Normocephalic and  atraumatic.  Cardiovascular:     Rate and Rhythm: Normal rate and regular rhythm.     Heart sounds: Normal heart sounds.  Pulmonary:     Effort: Pulmonary effort is normal.     Breath sounds: Normal breath sounds.  Abdominal:     Palpations: Abdomen is soft.     Comments: Mild upper abdominal discomfort one exam  Skin:    General: Skin is warm and dry.  Neurological:     Mental Status: She is alert.     ED Results / Procedures / Treatments   Labs (  all labs ordered are listed, but only abnormal results are displayed) Labs Reviewed  COMPREHENSIVE METABOLIC PANEL - Abnormal; Notable for the following components:      Result Value   Sodium 134 (*)    Glucose, Bld 171 (*)    Total Protein 8.9 (*)    AST 61 (*)    All other components within normal limits  CBC  LIPASE, BLOOD  TROPONIN I (HIGH SENSITIVITY)  TROPONIN I (HIGH SENSITIVITY)    EKG EKG Interpretation  Date/Time:  Monday July 04 2022 17:54:38 EDT Ventricular Rate:  69 PR Interval:  168 QRS Duration: 82 QT Interval:  390 QTC Calculation: 417 R Axis:   57 Text Interpretation: Normal sinus rhythm Normal ECG When compared with ECG of 04-Jul-2022 17:20, No significant change was found Confirmed by Delora Fuel ([id number]) on 07/05/2022 1:04:29 AM  Radiology DG Chest 2 View  Result Date: 07/04/2022 CLINICAL DATA:  Chest pain, vomiting EXAM: CHEST - 2 VIEW COMPARISON:  None Available. FINDINGS: Cardiac and mediastinal contours are within normal limits. No focal pulmonary opacity. No pleural effusion or pneumothorax. No acute osseous abnormality. IMPRESSION: No acute cardiopulmonary process. Electronically Signed   By: Merilyn Baba M.D.   On: 07/04/2022 18:25    Procedures Procedures    Medications Ordered in ED Medications  alum & mag hydroxide-simeth (MAALOX/MYLANTA) 200-200-20 MG/5ML suspension 30 mL (has no administration in time range)    ED Course/ Medical Decision Making/ A&P                            Medical Decision Making Amount and/or Complexity of Data Reviewed Labs:     Details: Normal WBC, normal hemoglobin, normal troponin x2.  Normal bilirubin and renal function Radiology: ordered and independent interpretation performed.    Details: No pulmonary edema/pneumonia on chest x-ray.  Unable to see gallbladder on right upper quadrant ultrasound ECG/medicine tests: independent interpretation performed.    Details: No acute ischemia  Risk OTC drugs. Prescription drug management.   Patient's symptoms are probably more GI rather than cardiac.  She does have risk factors for cardiac disease including type 2 diabetes and her age.  However her ECG is unremarkable and my suspicion is with this atypical presentation this is less likely to be cardiac.  I do think would be worth following up with PCP and they can consider stress testing/ischemic work-up but with the vomiting and upper abdominal discomfort and it is probably more GI related.  She has never had a cholecystectomy but it was hard to see the gallbladder on the ultrasound.  However with minimal LFT abnormalities and normal bilirubin I suspect gallbladder or gallbladder duct problems is unlikely.  At this point she is feeling well.  She has declined IV fluids which were offered.  Will discharge home with Zofran and Carafate.  She was given return precautions.        Final Clinical Impression(s) / ED Diagnoses Final diagnoses:  None    Rx / DC Orders ED Discharge Orders     None         Sherwood Gambler, MD 07/05/22 [phone]

## 2022-07-05 NOTE — Discharge Instructions (Signed)
If you develop recurrent, continued, or worsening chest pain, shortness of breath, fever, vomiting, abdominal or back pain, or any other new/concerning symptoms then return to the ER for evaluation.  

## 2022-07-11 ENCOUNTER — Ambulatory Visit (INDEPENDENT_AMBULATORY_CARE_PROVIDER_SITE_OTHER): Payer: Medicare HMO

## 2022-07-11 DIAGNOSIS — J309 Allergic rhinitis, unspecified: Secondary | ICD-10-CM | POA: Diagnosis not present

## 2022-07-12 ENCOUNTER — Inpatient Hospital Stay: Payer: Medicare HMO | Admitting: Adult Health

## 2022-07-14 ENCOUNTER — Encounter: Payer: Self-pay | Admitting: Adult Health

## 2022-07-14 ENCOUNTER — Ambulatory Visit (INDEPENDENT_AMBULATORY_CARE_PROVIDER_SITE_OTHER): Payer: Medicare HMO | Admitting: Adult Health

## 2022-07-14 VITALS — BP 140/70 | HR 69 | Temp 98.0°F | Ht 64.0 in | Wt 133.0 lb

## 2022-07-14 DIAGNOSIS — K2100 Gastro-esophageal reflux disease with esophagitis, without bleeding: Secondary | ICD-10-CM

## 2022-07-14 DIAGNOSIS — M25551 Pain in right hip: Secondary | ICD-10-CM

## 2022-07-14 MED ORDER — OMEPRAZOLE 40 MG PO CPDR: 40.0000 mg | DELAYED_RELEASE_CAPSULE | Freq: Every day | ORAL | 3 refills | Status: DC

## 2022-07-14 MED ORDER — SUCRALFATE 1 GM/10ML PO SUSP: 1.0000 g | Freq: Three times a day (TID) | ORAL | 0 refills | Status: DC

## 2022-07-14 NOTE — Progress Notes (Signed)
Subjective:    Patient ID: Kimberly Lam, female    DOB: Apr 16, 1946, 76 y.o.   MRN: [id number]  HPI 76 year old female who  has a past medical history of Acid reflux, Allergic rhinitis, Allergy, Anemia, Arthritis, Asthma, Bilateral sciatica, Cataract, Chronic bilateral low back pain, Colon polyps, Diverticulosis, DM type 2 (diabetes mellitus, type 2) (HCC), Environmental allergies, GERD (gastroesophageal reflux disease), Hyperlipidemia, Low back pain, Migraine, Osteoporosis, and PONV (postoperative nausea and vomiting).  She presents to the office today for follow-up after being seen in the emergency room.  She presented to the ER with chest pain and vomiting.  She reported that she developed a feeling like her chest was being bloated and swollen and then she started vomiting.  She did have diaphoresis during this and prior had 1 soft bowel movement.  Since that time she had on again and off again chest discomfort with no further vomiting however she had nausea.  She denies shortness of breath or leg swelling.  In the ER her work-up showed a normal WBC, normal hemoglobin, normal troponin x2.  Normal bilirubin and renal function.  X-ray of the chest did not show pneumonia or pulmonary edema.  It was hard to see the gallbladder on ultrasound but she had minimal LFT abnormalities and normal bilirubin Her ECG was unremarkable and was thought her symptoms were probably more GI rather than cardiac but she was advised to follow-up with her PCP to consider stress testing/ischemic work-up.   She was discharged home with Zofran and Carafate  Today she reports that she is feeling better overall, she has not had any additional chest pain but continues to have nausea after eating.  She has not had any vomiting but also endorses new loose stools and intermittent pain in her epigastric area after she eats.  She also reports waking up with a feeling of acid in the back of her throat.  She is using the  Carafate which helps but is not using Zofran.  Additionally, she reports right hip pain for multiple years but feels as though is getting worse.  Pain is worse with certain movements and ambulation.  Denies any catching or popping sensation.  Her last x-ray was in 2021 which showed no acute abnormality  Review of Systems See HPI   Past Medical History:  Diagnosis Date   Acid reflux    Allergic rhinitis    Allergy    Anemia    Arthritis    per MD- pt states no s/s of this    Asthma    Bilateral sciatica    Cataract    removed both eyes    Chronic bilateral low back pain    Colon polyps    Diverticulosis    DM type 2 (diabetes mellitus, type 2) (HCC)    Environmental allergies    flowers, pollen, trees   GERD (gastroesophageal reflux disease)    Hyperlipidemia    Low back pain    Migraine    past hx- control as allergies are controlled    Osteoporosis    PONV (postoperative nausea and vomiting)     Social History   Socioeconomic History   Marital status: Married    Spouse name: Not on file   Number of children: 2   Years of education: 16   Highest education level: Not on file  Occupational History   Occupation: Retired  Tobacco Use   Smoking status: Never   Smokeless tobacco: Never  Vaping  Use   Vaping Use: Never used  Substance and Sexual Activity   Alcohol use: No    Alcohol/week: 0.0 standard drinks of alcohol   Drug use: No   Sexual activity: Not on file  Other Topics Concern   Not on file  Social History Narrative      Married 1 son one daughter, relocated to [place] after living in [place] for many years. She is a Writer of [employer]   No caffeine.          Lives at home with her husband and brother.   Right-handed.   No caffeine use.   Social Determinants of Health   Financial Resource Strain: Low Risk  (04/08/2022)   Overall Financial Resource Strain (CARDIA)    Difficulty of Paying Living Expenses: Not hard at all   Food Insecurity: No Food Insecurity (04/08/2022)   Hunger Vital Sign    Worried About Running Out of Food in the Last Year: Never true    Ran Out of Food in the Last Year: Never true  Transportation Needs: No Transportation Needs (04/08/2022)   PRAPARE - Hydrologist (Medical): No    Lack of Transportation (Non-Medical): No  Physical Activity: Sufficiently Active (04/08/2022)   Exercise Vital Sign    Days of Exercise per Week: 5 days    Minutes of Exercise per Session: 40 min  Stress: No Stress Concern Present (04/08/2022)   [place]    Feeling of Stress : Only a little  Social Connections: Moderately Isolated (09/09/2021)   Social Connection and Isolation Panel [NHANES]    Frequency of Communication with Friends and Family: More than three times a week    Frequency of Social Gatherings with Friends and Family: More than three times a week    Attends Religious Services: Never    Marine scientist or Organizations: No    Attends Archivist Meetings: Never    Marital Status: Married  Human resources officer Violence: Not At Risk (09/09/2021)   Humiliation, Afraid, Rape, and Kick questionnaire    Fear of Current or Ex-Partner: No    Emotionally Abused: No    Physically Abused: No    Sexually Abused: No    Past Surgical History:  Procedure Laterality Date   CATARACT EXTRACTION, BILATERAL Bilateral 2012   CESAREAN SECTION     x 2   COLONOSCOPY     > 10 yrs ago- unsure if removed polyps as was told sev.different things after this colon    DENTAL SURGERY     molars removed    DILATION AND CURETTAGE OF UTERUS     NASAL SINUS SURGERY     TUBAL LIGATION      Family History  Problem Relation Age of Onset   Asthma Father    Emphysema Father    Alcohol abuse Father    Diabetes Mellitus II Brother        x 2   Hyperlipidemia Brother    Hypertension Brother    Pancreatic cancer  Brother    Prostate cancer Brother    Diabetes Brother    Heart disease Maternal Grandmother        hardening of the arteries   Heart murmur Daughter    Glaucoma Brother    Breast cancer Neg Hx    Colon cancer Neg Hx    Colon polyps Neg Hx  Esophageal cancer Neg Hx    Rectal cancer Neg Hx    Stomach cancer Neg Hx     Allergies  Allergen Reactions   Peanut-Containing Drug Products Anaphylaxis   Eggs Or Egg-Derived Products Nausea Only    Nausea only- can eat cakes with eggs    Milk-Related Compounds Nausea And Vomiting    Milk and cheese *Can drink 1 cup a day*   Actos [Pioglitazone] Nausea Only   Soy Allergy Nausea Only    Pt states had allergy testing and was told allergic to soy-causes nausea but no other reactions   Vicodin [Hydrocodone-Acetaminophen] Nausea Only    Current Outpatient Medications on File Prior to Visit  Medication Sig Dispense Refill   Accu-Chek FastClix Lancets MISC Use to check blood glucose TID 300 each 2   Alcohol Swabs (B-D SINGLE USE SWABS REGULAR) PADS USE TO TEST BLOOD GLUCOSE FOUR TIMES DAILY 200 each 3   alendronate (FOSAMAX) 70 MG tablet TAKE 1 TABLET BY MOUTH  WEEKLY WITH 8 OZ OF PLAIN  WATER 30 MINUTES BEFORE  FIRST FOOD, DRINK OR MEDS.  STAY UPRIGHT FOR 30 MINS (Patient taking differently: Take 70 mg by mouth once a week. TAKE 1 TABLET BY MOUTH  WEEKLY ( Tuesday) WITH 8 OZ OF PLAIN  WATER 30 MINUTES BEFORE  FIRST FOOD, DRINK OR MEDS.  STAY UPRIGHT FOR 30 MINS) 12 tablet 3   Ascorbic Acid (VITAMIN C) 1000 MG tablet Take 1,000 mg by mouth daily.     azelastine (ASTELIN) 0.1 % nasal spray Place 2 sprays into both nostrils 2 (two) times daily. 90 mL 1   Barberry-Oreg Grape-Goldenseal (BERBERINE COMPLEX PO) Take 1 capsule by mouth daily.     Blood Glucose Monitoring Suppl (ACCU-CHEK GUIDE) w/Device KIT 1 kit by Does not apply route as directed. 1 kit 0   CINNAMON PO Take 1 tablet by mouth as needed (for blood glucose).     Cranberry 450 MG CAPS  Take 450 mg by mouth daily.     Cyanocobalamin (VITAMIN B-12 CR) 1500 MCG TBCR Take 1,500 mcg by mouth daily.     Echinacea-Goldenseal (ECHINACEA COMB/GOLDEN SEAL PO) Take 1 tablet by mouth as needed (for prevention). Reported on 12/16/2015     EPINEPHrine 0.3 mg/0.3 mL IJ SOAJ injection Inject 0.3 mg into the muscle as needed for anaphylaxis. 3 each 1   fluticasone (FLONASE) 50 MCG/ACT nasal spray USE 2 SPRAYS INTO BOTH NOSTRILS DAILY. (Patient taking differently: Place 2 sprays into both nostrils daily.) 48 g 1   gabapentin (NEURONTIN) 300 MG capsule TAKE 2 CAPSULES BY MOUTH 3  TIMES DAILY (Patient taking differently: Take 600 mg by mouth 3 (three) times daily as needed (for leg cramps).) 540 capsule 2   glipiZIDE (GLUCOTROL XL) 5 MG 24 hr tablet Take 1 tablet (5 mg total) by mouth daily with breakfast. 90 tablet 3   glucose blood (ACCU-CHEK GUIDE) test strip Use as instructed 300 each 6   Green Tea, Camillia sinensis, (GREEN TEA PO) Take 1 tablet by mouth as needed (for wellbeing). Reported on 12/16/2015     Lactobacillus (ACIDOPHILUS PROBIOTIC PO) Take 1 tablet by mouth daily.     levocetirizine (XYZAL) 5 MG tablet TAKE 1 TABLET EVERY EVENING (Patient taking differently: Take 5 mg by mouth every evening.) 90 tablet 1   Melatonin ER 5 MG TBCR Take 5 mg by mouth at bedtime as needed (FOR SLEEP).     metFORMIN (GLUCOPHAGE-XR) 500 MG 24 hr tablet Take  2 tablets (1,000 mg total) by mouth 2 (two) times daily. 360 tablet 3   montelukast (SINGULAIR) 10 MG tablet TAKE 1 TABLET AT BEDTIME (Patient taking differently: Take 10 mg by mouth at bedtime.) 90 tablet 1   Omega-3 Fatty Acids (OMEGA 3 PO) Take 1 tablet by mouth as needed.     Polyethyl Glycol-Propyl Glycol (SYSTANE FREE OP) Apply 1 drop to eye daily. Uses thera work for dr eyes per pt     Potassium 95 MG TABS Take 95 mg by mouth daily.     ramipril (ALTACE) 2.5 MG capsule TAKE 1 CAPSULE BY MOUTH  DAILY 90 capsule 3   simvastatin (ZOCOR) 20 MG  tablet Take 1 tablet (20 mg total) by mouth daily. 90 tablet 3   Stinging Nettle 2 % POWD Take 1 tablet by mouth daily at 12 noon. Reported on 12/16/2015     traZODone (DESYREL) 50 MG tablet Take 0.5-1 tablets (25-50 mg total) by mouth at bedtime as needed. for sleep (Patient taking differently: Take 25-50 mg by mouth at bedtime as needed for sleep. for sleep) 90 tablet 3   TURMERIC PO Take 800 mg by mouth daily as needed (for well being). Reported on 12/16/2015     VENTOLIN HFA 108 (90 Base) MCG/ACT inhaler Inhale 2 puffs into the lungs every 6 (six) hours as needed for wheezing or shortness of breath. 54 g 1   No current facility-administered medications on file prior to visit.    BP 140/70   Pulse 69   Temp 98 F (36.7 C) (Oral)   Ht $R'5\' 4"'pz$  (1.626 m)   Wt 133 lb (60.3 kg)   SpO2 98%   BMI 22.83 kg/m       Objective:   Physical Exam Vitals and nursing note reviewed.  Constitutional:      Appearance: Normal appearance.  Cardiovascular:     Rate and Rhythm: Normal rate and regular rhythm.     Pulses: Normal pulses.     Heart sounds: Normal heart sounds.  Pulmonary:     Effort: Pulmonary effort is normal.     Breath sounds: Normal breath sounds.  Abdominal:     General: Abdomen is flat. Bowel sounds are normal.     Palpations: Abdomen is soft.     Tenderness: There is abdominal tenderness in the epigastric area.  Musculoskeletal:     Right hip: Normal.     Left hip: Normal.  Skin:    General: Skin is warm and dry.     Capillary Refill: Capillary refill takes less than 2 seconds.  Neurological:     General: No focal deficit present.     Mental Status: She is alert and oriented to person, place, and time.  Psychiatric:        Mood and Affect: Mood normal.        Behavior: Behavior normal.        Thought Content: Thought content normal.        Judgment: Judgment normal.       Assessment & Plan:  1. Gastroesophageal reflux disease with esophagitis without  hemorrhage - symptoms consistent with GERD. Will have her take prilosec 40 mg daily x 30 days then stop  - omeprazole (PRILOSEC) 40 MG capsule; Take 1 capsule (40 mg total) by mouth daily.  Dispense: 30 capsule; Refill: 3 - sucralfate (CARAFATE) 1 GM/10ML suspension; Take 10 mLs (1 g total) by mouth 4 (four) times daily -  with meals and at  bedtime.  Dispense: 420 mL; Refill: 0 - Follow up as needed 2. Right hip pain  - DG Hip Unilat W OR W/O Pelvis 2-3 Views Right; Future  [name], NP

## 2022-07-14 NOTE — Patient Instructions (Signed)
I think a lot of your symptoms are from acid reflux.   I am going to put you on Prilosec 40 mg daily for 30 days    Dr. Redmond Baseman Address: [address], [place], [place] [address] Phone: [phone]

## 2022-07-18 ENCOUNTER — Other Ambulatory Visit: Payer: Medicare HMO

## 2022-07-18 ENCOUNTER — Ambulatory Visit (INDEPENDENT_AMBULATORY_CARE_PROVIDER_SITE_OTHER): Payer: Medicare HMO

## 2022-07-18 DIAGNOSIS — M25551 Pain in right hip: Secondary | ICD-10-CM

## 2022-07-18 DIAGNOSIS — M1610 Unilateral primary osteoarthritis, unspecified hip: Secondary | ICD-10-CM

## 2022-07-27 ENCOUNTER — Ambulatory Visit (INDEPENDENT_AMBULATORY_CARE_PROVIDER_SITE_OTHER): Payer: Medicare HMO

## 2022-07-27 DIAGNOSIS — J309 Allergic rhinitis, unspecified: Secondary | ICD-10-CM | POA: Diagnosis not present

## 2022-08-03 ENCOUNTER — Other Ambulatory Visit: Payer: Self-pay | Admitting: Adult Health

## 2022-08-03 DIAGNOSIS — K2100 Gastro-esophageal reflux disease with esophagitis, without bleeding: Secondary | ICD-10-CM

## 2022-08-04 MED ORDER — SUCRALFATE 1 GM/10ML PO SUSP: 1.0000 g | Freq: Three times a day (TID) | ORAL | 0 refills | Status: DC

## 2022-08-08 ENCOUNTER — Ambulatory Visit
Admission: RE | Admit: 2022-08-08 | Discharge: 2022-08-08 | Disposition: A | Discharge status: Home / Self Care | Payer: Medicare HMO | Source: Ambulatory Visit | Attending: Adult Health | Admitting: Adult Health

## 2022-08-08 DIAGNOSIS — K219 Gastro-esophageal reflux disease without esophagitis: Secondary | ICD-10-CM

## 2022-08-08 DIAGNOSIS — Z1231 Encounter for screening mammogram for malignant neoplasm of breast: Secondary | ICD-10-CM

## 2022-08-08 DIAGNOSIS — E1169 Type 2 diabetes mellitus with other specified complication: Secondary | ICD-10-CM

## 2022-08-08 DIAGNOSIS — M810 Age-related osteoporosis without current pathological fracture: Secondary | ICD-10-CM

## 2022-08-08 DIAGNOSIS — Z0000 Encounter for general adult medical examination without abnormal findings: Secondary | ICD-10-CM

## 2022-08-08 DIAGNOSIS — G6289 Other specified polyneuropathies: Secondary | ICD-10-CM

## 2022-08-08 DIAGNOSIS — Z780 Asymptomatic menopausal state: Secondary | ICD-10-CM | POA: Diagnosis not present

## 2022-08-08 DIAGNOSIS — M8589 Other specified disorders of bone density and structure, multiple sites: Secondary | ICD-10-CM | POA: Diagnosis not present

## 2022-08-08 DIAGNOSIS — E119 Type 2 diabetes mellitus without complications: Secondary | ICD-10-CM

## 2022-08-09 ENCOUNTER — Ambulatory Visit (INDEPENDENT_AMBULATORY_CARE_PROVIDER_SITE_OTHER): Payer: Medicare HMO | Admitting: *Deleted

## 2022-08-09 DIAGNOSIS — J309 Allergic rhinitis, unspecified: Secondary | ICD-10-CM

## 2022-08-10 ENCOUNTER — Other Ambulatory Visit: Payer: Self-pay | Admitting: Adult Health

## 2022-08-10 DIAGNOSIS — R928 Other abnormal and inconclusive findings on diagnostic imaging of breast: Secondary | ICD-10-CM

## 2022-08-11 ENCOUNTER — Other Ambulatory Visit: Payer: Self-pay | Admitting: Adult Health

## 2022-08-11 DIAGNOSIS — K2100 Gastro-esophageal reflux disease with esophagitis, without bleeding: Secondary | ICD-10-CM

## 2022-08-12 ENCOUNTER — Encounter: Payer: Self-pay | Admitting: Adult Health

## 2022-08-12 DIAGNOSIS — K2100 Gastro-esophageal reflux disease with esophagitis, without bleeding: Secondary | ICD-10-CM

## 2022-08-12 DIAGNOSIS — K219 Gastro-esophageal reflux disease without esophagitis: Secondary | ICD-10-CM

## 2022-08-12 NOTE — Telephone Encounter (Signed)
Pt notified of update. Pt wants to know if she should still take curafate

## 2022-08-12 NOTE — Telephone Encounter (Signed)
Referral placed for GI 

## 2022-08-12 NOTE — Telephone Encounter (Signed)
FYI

## 2022-08-16 ENCOUNTER — Telehealth: Payer: Self-pay | Admitting: Podiatry

## 2022-08-16 ENCOUNTER — Other Ambulatory Visit: Payer: Self-pay | Admitting: Adult Health

## 2022-08-16 ENCOUNTER — Ambulatory Visit
Admission: RE | Admit: 2022-08-16 | Discharge: 2022-08-16 | Disposition: A | Discharge status: Home / Self Care | Payer: Medicare HMO | Source: Ambulatory Visit | Attending: Adult Health | Admitting: Adult Health

## 2022-08-16 ENCOUNTER — Ambulatory Visit (INDEPENDENT_AMBULATORY_CARE_PROVIDER_SITE_OTHER): Payer: Medicare HMO | Admitting: *Deleted

## 2022-08-16 DIAGNOSIS — R921 Mammographic calcification found on diagnostic imaging of breast: Secondary | ICD-10-CM

## 2022-08-16 DIAGNOSIS — J309 Allergic rhinitis, unspecified: Secondary | ICD-10-CM | POA: Diagnosis not present

## 2022-08-16 DIAGNOSIS — R928 Other abnormal and inconclusive findings on diagnostic imaging of breast: Secondary | ICD-10-CM

## 2022-08-16 MED ORDER — SUCRALFATE 1 GM/10ML PO SUSP: ORAL | 0 refills | Status: DC

## 2022-08-16 NOTE — Addendum Note (Signed)
Addended by: Gwenyth Ober R on: 08/16/2022 07:37 AM   Modules accepted: Orders

## 2022-08-16 NOTE — Telephone Encounter (Signed)
Left message to give the office a call to schedule for diabetic shoes measurement

## 2022-08-19 ENCOUNTER — Encounter: Payer: Self-pay | Admitting: Adult Health

## 2022-08-19 NOTE — Telephone Encounter (Signed)
Ok to set an appt up

## 2022-08-23 ENCOUNTER — Encounter: Payer: Self-pay | Admitting: Adult Health

## 2022-08-24 ENCOUNTER — Ambulatory Visit (INDEPENDENT_AMBULATORY_CARE_PROVIDER_SITE_OTHER): Payer: Medicare HMO | Admitting: *Deleted

## 2022-08-24 DIAGNOSIS — J309 Allergic rhinitis, unspecified: Secondary | ICD-10-CM

## 2022-08-25 ENCOUNTER — Other Ambulatory Visit: Payer: Self-pay | Admitting: Adult Health

## 2022-08-25 DIAGNOSIS — K2100 Gastro-esophageal reflux disease with esophagitis, without bleeding: Secondary | ICD-10-CM

## 2022-08-26 ENCOUNTER — Ambulatory Visit
Admission: RE | Admit: 2022-08-26 | Discharge: 2022-08-26 | Disposition: A | Discharge status: Home / Self Care | Payer: Medicare HMO | Source: Ambulatory Visit | Attending: Adult Health | Admitting: Adult Health

## 2022-08-26 DIAGNOSIS — N6011 Diffuse cystic mastopathy of right breast: Secondary | ICD-10-CM | POA: Diagnosis not present

## 2022-08-26 DIAGNOSIS — R921 Mammographic calcification found on diagnostic imaging of breast: Secondary | ICD-10-CM | POA: Diagnosis not present

## 2022-08-30 ENCOUNTER — Ambulatory Visit (INDEPENDENT_AMBULATORY_CARE_PROVIDER_SITE_OTHER): Payer: Medicare HMO | Admitting: *Deleted

## 2022-08-30 DIAGNOSIS — J309 Allergic rhinitis, unspecified: Secondary | ICD-10-CM | POA: Diagnosis not present

## 2022-09-06 ENCOUNTER — Ambulatory Visit (INDEPENDENT_AMBULATORY_CARE_PROVIDER_SITE_OTHER): Payer: Medicare HMO

## 2022-09-06 DIAGNOSIS — J309 Allergic rhinitis, unspecified: Secondary | ICD-10-CM

## 2022-09-08 ENCOUNTER — Other Ambulatory Visit: Payer: Self-pay | Admitting: Family Medicine

## 2022-09-13 ENCOUNTER — Ambulatory Visit: Payer: Medicare Other

## 2022-09-13 ENCOUNTER — Other Ambulatory Visit: Payer: Self-pay

## 2022-09-13 DIAGNOSIS — E119 Type 2 diabetes mellitus without complications: Secondary | ICD-10-CM | POA: Diagnosis not present

## 2022-09-13 DIAGNOSIS — K2100 Gastro-esophageal reflux disease with esophagitis, without bleeding: Secondary | ICD-10-CM

## 2022-09-13 MED ORDER — OMEPRAZOLE 40 MG PO CPDR: 40.0000 mg | DELAYED_RELEASE_CAPSULE | Freq: Every day | ORAL | 1 refills | Status: DC

## 2022-09-13 NOTE — Progress Notes (Unsigned)
Follow Up Note  RE: Kimberly Lam MRN: [id number] DOB: 09/21/1946 Date of Office Visit: 09/14/2022  Referring provider: [name], NP Primary care provider: [name], NP  Chief Complaint: No chief complaint on file.  History of Present Illness: I had the pleasure of seeing Kimberly Lam for a follow up visit at the [facility] [place] on 09/13/2022. She is a 76 y.o. female, who is being followed for asthma, allergic rhinitis on AIT, reflux and food allergy. Her previous allergy office visit was on 04/25/2022 with Kimberly Lam. Today is a regular follow up visit.  Asthma Continue montelukast 10 mg once a day to prevent cough or wheeze Continue albuterol 2 puffs once every 4 hours as needed for cough or wheeze You may use albuterol 2 puffs 5- 15 minutes before activity to decrease cough or wheeze   Allergic rhinitis Stop atrovent (ipratropium bromide) nasal spray Continue allergen avoidance measures directed toward pollens, pets, molds, and cockroach as listed below Continue allergen immunotherapy and have access to your epinephrine auto injector device. Continue an antihistamine once a day as needed for runny nose or itch Continue Azelastine  2 sprays in each nostril up to twice a day as needed for runny nose/drainage down throat Stop Flonase  for the next 5-7 days due to nasal irritation. Then restart Flonase 2 sprays in each nostril once a day as needed for stuffy nose. In the right nostril, point the applicator out toward the right ear. In the left nostril, point the applicator out toward the left ear Continue saline nasal rinses as needed for nasal symptoms. Try not squeezing the bottle so hard. Use this before any medicated nasal sprays for best result   Reflux Continue dietary and lifestyle modifications as listed below Continue omeprazole 40 mg once a day to control reflux   Food allergy Continue to avoid peanuts, milk, and soy.  In case of an  allergic reaction, take Benadryl 50 mg  every 4 hours, and if life-threatening symptoms occur, inject with EpiPen 0.3 mg.   Moderate persistent asthma, uncomplicated Past history - Felt better with [facility] but could not afford to purchase after samples ran out. Reports that inhalers cause her mouth to get sores and become white Interim history - stable with no inhaler use.  Today's spirometry was normal.  Daily controller medication(s):  Continue Singulair 47m daily.  May use albuterol rescue inhaler 2 puffs or nebulizer every 4 to 6 hours as needed for shortness of breath, chest tightness, coughing, and wheezing. May use albuterol rescue inhaler 2 puffs 5 to 15 minutes prior to strenuous physical activities. Monitor frequency of use.    Perennial and seasonal allergic rhinoconjunctivitis Past history - started AIT on 10/27/2015 (TREE/GRASS-WEED-MITE-CAT-DOG/MOLD-CR).  Interim history - Unable to go more than 2 weeks without injections. [place] of [place]. Continue environmental control measures. Continue allergy injections every 2 weeks.  Once you have a new allergist - let [facility] and will fax over records and instructions regarding the allergy shots. Discussed with patient that she can NOT inject allergy shots at home even though there's a family member in the medical field giving it.  May use over the counter antihistamines such as Zyrtec (cetirizine), Claritin (loratadine), Allegra (fexofenadine) as needed in the morning.  USE saline lavage before use the nasal sprays in the morning and at night. Use Flonase (fluticasone) nasal spray 1 spray per nostril twice a day as needed for nasal congestion.  May use Atrovent 2 sprays in each nostril 3 times a day as needed for drainage.  Continue montelukast 10mg  daily.    Nasal septal perforation Past history - Saw ENT in the past (no intervention). Interim history - unchanged. Monitor symptoms.   Follow up with ENT as needed.     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 Interim history - No reactions. Continue to avoid peanuts, cow's milk, soybean. For mild symptoms you can take over the counter antihistamines such as Benadryl and monitor symptoms closely. If symptoms worsen or if you have severe symptoms including breathing issues, throat closure, significant swelling, whole body hives, severe diarrhea and vomiting, lightheadedness then inject epinephrine and seek immediate medical care afterwards.   Gastroesophageal reflux disease Stable.  Continue taking omeprazole 40mg  daily as needed.  Assessment and Plan: Kimberly Lam is a 76 y.o. female with: No problem-specific Assessment & Plan notes found for this encounter.  No follow-ups on file.  No orders of the defined types were placed in this encounter.  Lab Orders  No laboratory test(s) ordered today    Diagnostics: Spirometry:  Tracings reviewed. Her effort: {Blank single:19197::"Good reproducible efforts.","It was hard to get consistent efforts and there is a question as to whether this reflects a maximal maneuver.","Poor effort, data can not be interpreted."} FVC: ***L FEV1: ***L, ***% predicted FEV1/FVC ratio: ***% Interpretation: {Blank single:19197::"Spirometry consistent with mild obstructive disease","Spirometry consistent with moderate obstructive disease","Spirometry consistent with severe obstructive disease","Spirometry consistent with possible restrictive disease","Spirometry consistent with mixed obstructive and restrictive disease","Spirometry uninterpretable due to technique","Spirometry consistent with normal pattern","No overt abnormalities noted given today's efforts"}.  Please see scanned spirometry results for details.  Skin Testing: {Blank single:19197::"Select foods","Environmental allergy panel","Environmental allergy panel and select foods","Food allergy panel","None","Deferred  due to recent antihistamines use"}. *** Results discussed with patient/family.   Medication List:  Current Outpatient Medications  Medication Sig Dispense Refill   Accu-Chek FastClix Lancets MISC Use to check blood glucose TID 300 each 2   Alcohol Swabs (B-D SINGLE USE SWABS REGULAR) PADS USE TO TEST BLOOD GLUCOSE FOUR TIMES DAILY 200 each 3   alendronate (FOSAMAX) 70 MG tablet TAKE 1 TABLET BY MOUTH  WEEKLY WITH 8 OZ OF PLAIN  WATER 30 MINUTES BEFORE  FIRST FOOD, DRINK OR MEDS.  STAY UPRIGHT FOR 30 MINS (Patient taking differently: Take 70 mg by mouth once a week. TAKE 1 TABLET BY MOUTH  WEEKLY ( Tuesday) WITH 8 OZ OF PLAIN  WATER 30 MINUTES BEFORE  FIRST FOOD, DRINK OR MEDS.  STAY UPRIGHT FOR 30 MINS) 12 tablet 3   Ascorbic Acid (VITAMIN C) 1000 MG tablet Take 1,000 mg by mouth daily.     Azelastine HCl 137 MCG/SPRAY SOLN USE 2 SPRAYS INTO BOTH NOSTRILS 2 (TWO) TIMES DAILY. 30 mL 0   Barberry-Oreg Grape-Goldenseal (BERBERINE COMPLEX PO) Take 1 capsule by mouth daily.     Blood Glucose Monitoring Suppl (ACCU-CHEK GUIDE) w/Device KIT 1 kit by Does not apply route as directed. 1 kit 0   CINNAMON PO Take 1 tablet by mouth as needed (for blood glucose).     Cranberry 450 MG CAPS Take 450 mg by mouth daily.     Cyanocobalamin (VITAMIN B-12 CR) 1500 MCG TBCR Take 1,500 mcg by mouth daily.     Echinacea-Goldenseal (ECHINACEA COMB/GOLDEN SEAL PO) Take 1 tablet by mouth as needed (for prevention). Reported on 12/16/2015     EPINEPHrine 0.3 mg/0.3 mL IJ SOAJ injection  Inject 0.3 mg into the muscle as needed for anaphylaxis. 3 each 1   fluticasone (FLONASE) 50 MCG/ACT nasal spray USE 2 SPRAYS INTO BOTH NOSTRILS DAILY. (Patient taking differently: Place 2 sprays into both nostrils daily.) 48 g 1   gabapentin (NEURONTIN) 300 MG capsule TAKE 2 CAPSULES BY MOUTH 3  TIMES DAILY (Patient taking differently: Take 600 mg by mouth 3 (three) times daily as needed (for leg cramps).) 540 capsule 2   glipiZIDE  (GLUCOTROL XL) 5 MG 24 hr tablet Take 1 tablet (5 mg total) by mouth daily with breakfast. 90 tablet 3   glucose blood (ACCU-CHEK GUIDE) test strip Use as instructed 300 each 6   Green Tea, Camillia sinensis, (GREEN TEA PO) Take 1 tablet by mouth as needed (for wellbeing). Reported on 12/16/2015     Lactobacillus (ACIDOPHILUS PROBIOTIC PO) Take 1 tablet by mouth daily.     levocetirizine (XYZAL) 5 MG tablet TAKE 1 TABLET EVERY EVENING (Patient taking differently: Take 5 mg by mouth every evening.) 90 tablet 1   Melatonin ER 5 MG TBCR Take 5 mg by mouth at bedtime as needed (FOR SLEEP).     metFORMIN (GLUCOPHAGE-XR) 500 MG 24 hr tablet Take 2 tablets (1,000 mg total) by mouth 2 (two) times daily. 360 tablet 3   montelukast (SINGULAIR) 10 MG tablet TAKE 1 TABLET AT BEDTIME (Patient taking differently: Take 10 mg by mouth at bedtime.) 90 tablet 1   Omega-3 Fatty Acids (OMEGA 3 PO) Take 1 tablet by mouth as needed.     omeprazole (PRILOSEC) 40 MG capsule Take 1 capsule (40 mg total) by mouth daily. 30 capsule 3   Polyethyl Glycol-Propyl Glycol (SYSTANE FREE OP) Apply 1 drop to eye daily. Uses thera work for dr eyes per pt     Potassium 95 MG TABS Take 95 mg by mouth daily.     ramipril (ALTACE) 2.5 MG capsule TAKE 1 CAPSULE BY MOUTH  DAILY 90 capsule 3   simvastatin (ZOCOR) 20 MG tablet Take 1 tablet (20 mg total) by mouth daily. 90 tablet 3   Stinging Nettle 2 % POWD Take 1 tablet by mouth daily at 12 noon. Reported on 12/16/2015     sucralfate (CARAFATE) 1 GM/10ML suspension SHAKE LIQUID AND TAKE 10 ML(1 GRAM) BY MOUTH FOUR TIMES DAILY WITH MEALS AND AT BEDTIME 420 mL 0   traZODone (DESYREL) 50 MG tablet Take 0.5-1 tablets (25-50 mg total) by mouth at bedtime as needed. for sleep (Patient taking differently: Take 25-50 mg by mouth at bedtime as needed for sleep. for sleep) 90 tablet 3   TURMERIC PO Take 800 mg by mouth daily as needed (for well being). Reported on 12/16/2015     VENTOLIN HFA 108 (90  Base) MCG/ACT inhaler Inhale 2 puffs into the lungs every 6 (six) hours as needed for wheezing or shortness of breath. 54 g 1   No current facility-administered medications for this visit.   Allergies: Allergies  Allergen Reactions   Peanut-Containing Drug Products Anaphylaxis   Eggs Or Egg-Derived Products Nausea Only    Nausea only- can eat cakes with eggs    Milk-Related Compounds Nausea And Vomiting    Milk and cheese *Can drink 1 cup a day*   Actos [Pioglitazone] Nausea Only   Soy Allergy Nausea Only    Pt states had allergy testing and was told allergic to soy-causes nausea but no other reactions   Vicodin [Hydrocodone-Acetaminophen] Nausea Only   I reviewed her past medical  history, social history, family history, and environmental history and no significant changes have been reported from her previous visit.  Review of Systems  Constitutional:  Negative for appetite change, chills, fever and unexpected weight change.  HENT:  Negative for congestion and rhinorrhea.   Eyes:  Negative for itching.  Respiratory:  Negative for cough, chest tightness, shortness of breath and wheezing.   Gastrointestinal:  Negative for abdominal pain.  Skin:  Negative for rash.  Allergic/Immunologic: Positive for environmental allergies and food allergies.  Neurological:  Negative for headaches.    Objective: There were no vitals taken for this visit. There is no height or weight on file to calculate BMI. Physical Exam Vitals and nursing note reviewed.  Constitutional:      Appearance: Normal appearance. She is well-developed.  HENT:     Head: Normocephalic and atraumatic.     Right Ear: Tympanic membrane and external ear normal.     Left Ear: Tympanic membrane and external ear normal.     Nose: Nose normal.     Mouth/Throat:     Mouth: Mucous membranes are moist.     Pharynx: Oropharynx is clear.  Eyes:     Conjunctiva/sclera: Conjunctivae normal.  Cardiovascular:     Rate and  Rhythm: Normal rate and regular rhythm.     Heart sounds: Normal heart sounds. No murmur heard. Pulmonary:     Effort: Pulmonary effort is normal.     Breath sounds: Normal breath sounds. No wheezing, rhonchi or rales.  Musculoskeletal:     Cervical back: Neck supple.  Skin:    General: Skin is warm.     Findings: No rash.  Neurological:     Mental Status: She is alert and oriented to person, place, and time.  Psychiatric:        Behavior: Behavior normal.    Previous notes and tests were reviewed. The plan was reviewed with the patient/family, and all questions/concerned were addressed.  It was my pleasure to see Kimberly Lam today and participate in her care. Please feel free to contact me with any questions or concerns.  Sincerely,  Rexene Alberts, DO Allergy & Immunology  [facility] office: [place] office: [phone]

## 2022-09-14 ENCOUNTER — Ambulatory Visit (INDEPENDENT_AMBULATORY_CARE_PROVIDER_SITE_OTHER): Payer: Medicare HMO | Admitting: Allergy

## 2022-09-14 ENCOUNTER — Encounter: Payer: Self-pay | Admitting: Allergy

## 2022-09-14 VITALS — BP 128/70 | HR 64 | Temp 97.9°F | Resp 16 | Ht 64.0 in | Wt 133.8 lb

## 2022-09-14 DIAGNOSIS — J3089 Other allergic rhinitis: Secondary | ICD-10-CM

## 2022-09-14 DIAGNOSIS — J4540 Moderate persistent asthma, uncomplicated: Secondary | ICD-10-CM

## 2022-09-14 DIAGNOSIS — T7800XD Anaphylactic reaction due to unspecified food, subsequent encounter: Secondary | ICD-10-CM

## 2022-09-14 DIAGNOSIS — K219 Gastro-esophageal reflux disease without esophagitis: Secondary | ICD-10-CM | POA: Diagnosis not present

## 2022-09-14 DIAGNOSIS — J3489 Other specified disorders of nose and nasal sinuses: Secondary | ICD-10-CM

## 2022-09-14 MED ORDER — RYALTRIS 665-25 MCG/ACT NA SUSP: 1.0000 | Freq: Two times a day (BID) | NASAL | 5 refills | Status: DC

## 2022-09-14 MED ORDER — MONTELUKAST SODIUM 10 MG PO TABS: 10.0000 mg | ORAL_TABLET | Freq: Every day | ORAL | 3 refills | Status: DC

## 2022-09-14 NOTE — Patient Instructions (Addendum)
Asthma Daily controller medication(s): Continue Singulair (montelukast) 10 mg daily at night. May use albuterol rescue inhaler 2 puffs every 4 to 6 hours as needed for shortness of breath, chest tightness, coughing, and wheezing. May use albuterol rescue inhaler 2 puffs 5 to 15 minutes prior to strenuous physical activities. Monitor frequency of use.  Asthma control goals:  Full participation in all desired activities (may need albuterol before activity) Albuterol use two times or less a week on average (not counting use with activity) Cough interfering with sleep two times or less a month Oral steroids no more than once a year No hospitalizations   Environmental allergies Continue environmental control measures. Continue allergy injections every 2 weeks.  Use over the counter antihistamines such as Zyrtec (cetirizine), Claritin (loratadine), Allegra (fexofenadine), or Xyzal (levocetirizine) daily as needed. May switch antihistamines every few months. Start [name] (olopatadine + mometasone nasal spray combination) 1-2 sprays per nostril twice a day. Sample given. This replaces your other nasal sprays. If this works well for you, then have [facility] ship the medication to your home - prescription already sent in.  If too expensive then restart azelastine and Flonase as before. Nasal saline spray (i.e., Simply Saline) or nasal saline lavage (i.e., NeilMed) is recommended as needed and prior to medicated nasal sprays.  Septal perforation Follow up with ENT.   Reflux Continue dietary and lifestyle modifications. Continue omeprazole 40 mg once a day to control reflux.  Food allergy Continue to avoid peanuts, milk, and soy.   In case of an allergic reaction, take Benadryl 50 mg every 4 hours, and if life-threatening symptoms occur, inject with EpiPen 0.3 mg.  Follow up in 4 months or sooner if needed.

## 2022-09-14 NOTE — Assessment & Plan Note (Signed)
Past history - Saw ENT in the past (no intervention). Interim history - unchanged.  Follow up with ENT.

## 2022-09-14 NOTE — Assessment & Plan Note (Signed)
Past history - started AIT on 10/27/2015 (TREE/GRASS-WEED-MITE-CAT-DOG/MOLD-CR).  Interim history - still having sinus issues. . Continue environmental control measures. . Continue allergy injections every 2 weeks - gets headaches/sinus issues if it's been more than 2 weeks.  . Use over the counter antihistamines such as Zyrtec (cetirizine), Claritin (loratadine), Allegra (fexofenadine), or Xyzal (levocetirizine) daily as needed. May switch antihistamines every few months. . Start [name] (olopatadine + mometasone nasal spray combination) 1-2 sprays per nostril twice a day. Sample given. . This replaces your other nasal sprays. . If this works well for you, then have [facility] ship the medication to your home - prescription already sent in.  . If too expensive then restart azelastine and Flonase as before. . Nasal saline spray (i.e., Simply Saline) or nasal saline lavage (i.e., NeilMed) is recommended as needed and prior to medicated nasal sprays.

## 2022-09-14 NOTE — Assessment & Plan Note (Signed)
Past history - Felt better with [facility] but could not afford to purchase after samples ran out. Reports that inhalers cause her mouth to get sores and become white Interim history - controlled. No inhaler use and no prednisone.  Today's spirometry was unremarkable.  . Daily controller medication(s): Continue Singulair (montelukast) 10 mg daily at night. . May use albuterol rescue inhaler 2 puffs every 4 to 6 hours as needed for shortness of breath, chest tightness, coughing, and wheezing. May use albuterol rescue inhaler 2 puffs 5 to 15 minutes prior to strenuous physical activities. Monitor frequency of use.

## 2022-09-14 NOTE — Assessment & Plan Note (Signed)
Past history - soy caused increased rhinitis symptoms. Interim history - No reactions.  Continue to avoid peanuts, cow's milk, soybean.  For mild symptoms you can take over the counter antihistamines such as Benadryl and monitor symptoms closely. If symptoms worsen or if you have severe symptoms including breathing issues, throat closure, significant swelling, whole body hives, severe diarrhea and vomiting, lightheadedness then inject epinephrine and seek immediate medical care afterwards.

## 2022-09-14 NOTE — Assessment & Plan Note (Signed)
Recently started on Carafate due to increased symptoms.    Continue taking omeprazole '40mg'$  daily.  Finish Carafate as prescribed.

## 2022-09-16 ENCOUNTER — Ambulatory Visit (INDEPENDENT_AMBULATORY_CARE_PROVIDER_SITE_OTHER): Payer: Medicare HMO

## 2022-09-16 ENCOUNTER — Other Ambulatory Visit: Payer: Self-pay

## 2022-09-16 ENCOUNTER — Telehealth: Payer: Medicare HMO

## 2022-09-16 VITALS — Ht 64.0 in | Wt 133.0 lb

## 2022-09-16 DIAGNOSIS — Z0000 Encounter for general adult medical examination without abnormal findings: Secondary | ICD-10-CM | POA: Diagnosis not present

## 2022-09-16 NOTE — Progress Notes (Signed)
Subjective:   Kimberly Lam is a 76 y.o. female who presents for Medicare Annual (Subsequent) preventive examination.  Review of Systems    Virtual Visit via Telephone Note  I connected with  Daphney Hopke Speece on 09/16/22 at  3:15 PM EDT by telephone and verified that I am speaking with the correct person using two identifiers.  Location: Patient: Home Provider: Office Persons participating in the virtual visit: patient/Nurse Health Advisor   I discussed the limitations, risks, security and privacy concerns of performing an evaluation and management service by telephone and the availability of in person appointments. The patient expressed understanding and agreed to proceed.  Interactive audio and video telecommunications were attempted between this nurse and patient, however failed, due to patient having technical difficulties OR patient did not have access to video capability.  We continued and completed visit with audio only.  Some vital signs may be absent or patient reported.   Tillie Rung, LPN  Cardiac Risk Factors include: advanced age (>50men, >82 women);diabetes mellitus     Objective:    Today's Vitals   09/16/22 1525  Weight: 133 lb (60.3 kg)  Height: 5\' 4"  (1.626 m)   Body mass index is 22.83 kg/m.     09/16/2022    3:35 PM 04/08/2022    1:28 PM 09/09/2021    3:33 PM 10/12/2020    3:33 PM 09/03/2020    2:07 PM 07/13/2020    3:51 PM 01/14/2020    4:35 PM  Advanced Directives  Does Patient Have a Medical Advance Directive? No No No No No No No  Does patient want to make changes to medical advance directive?  No - Patient declined  No - Patient declined   No - Patient declined  Would patient like information on creating a medical advance directive? No - Patient declined  No - Patient declined  No - Patient declined No - Patient declined     Current Medications (verified) Outpatient Encounter Medications as of 09/16/2022  Medication Sig   Accu-Chek  FastClix Lancets MISC Use to check blood glucose TID   Alcohol Swabs (B-D SINGLE USE SWABS REGULAR) PADS USE TO TEST BLOOD GLUCOSE FOUR TIMES DAILY   alendronate (FOSAMAX) 70 MG tablet TAKE 1 TABLET BY MOUTH  WEEKLY WITH 8 OZ OF PLAIN  WATER 30 MINUTES BEFORE  FIRST FOOD, DRINK OR MEDS.  STAY UPRIGHT FOR 30 MINS (Patient taking differently: Take 70 mg by mouth once a week. TAKE 1 TABLET BY MOUTH  WEEKLY ( Tuesday) WITH 8 OZ OF PLAIN  WATER 30 MINUTES BEFORE  FIRST FOOD, DRINK OR MEDS.  STAY UPRIGHT FOR 30 MINS)   Ascorbic Acid (VITAMIN C) 1000 MG tablet Take 1,000 mg by mouth daily.   Azelastine HCl 137 MCG/SPRAY SOLN USE 2 SPRAYS INTO BOTH NOSTRILS 2 (TWO) TIMES DAILY.   Barberry-Oreg Grape-Goldenseal (BERBERINE COMPLEX PO) Take 1 capsule by mouth daily.   Blood Glucose Monitoring Suppl (ACCU-CHEK GUIDE) w/Device KIT 1 kit by Does not apply route as directed.   CINNAMON PO Take 1 tablet by mouth as needed (for blood glucose).   Cranberry 450 MG CAPS Take 450 mg by mouth daily.   Cyanocobalamin (VITAMIN B-12 CR) 1500 MCG TBCR Take 1,500 mcg by mouth daily.   Echinacea-Goldenseal (ECHINACEA COMB/GOLDEN SEAL PO) Take 1 tablet by mouth as needed (for prevention). Reported on 12/16/2015   EPINEPHrine 0.3 mg/0.3 mL IJ SOAJ injection Inject 0.3 mg into the muscle as needed for anaphylaxis.  fluticasone (FLONASE) 50 MCG/ACT nasal spray USE 2 SPRAYS INTO BOTH NOSTRILS DAILY. (Patient taking differently: Place 2 sprays into both nostrils daily.)   gabapentin (NEURONTIN) 300 MG capsule TAKE 2 CAPSULES BY MOUTH 3  TIMES DAILY (Patient taking differently: Take 600 mg by mouth 3 (three) times daily as needed (for leg cramps).)   glipiZIDE (GLUCOTROL XL) 5 MG 24 hr tablet Take 1 tablet (5 mg total) by mouth daily with breakfast.   glucose blood (ACCU-CHEK GUIDE) test strip Use as instructed   Green Tea, Camillia sinensis, (GREEN TEA PO) Take 1 tablet by mouth as needed (for wellbeing). Reported on 12/16/2015    Lactobacillus (ACIDOPHILUS PROBIOTIC PO) Take 1 tablet by mouth daily.   levocetirizine (XYZAL) 5 MG tablet TAKE 1 TABLET EVERY EVENING (Patient taking differently: Take 5 mg by mouth every evening.)   Melatonin ER 5 MG TBCR Take 5 mg by mouth at bedtime as needed (FOR SLEEP).   metFORMIN (GLUCOPHAGE-XR) 500 MG 24 hr tablet Take 2 tablets (1,000 mg total) by mouth 2 (two) times daily.   montelukast (SINGULAIR) 10 MG tablet Take 1 tablet (10 mg total) by mouth at bedtime.   Olopatadine-Mometasone ([name]) [id number] MCG/ACT SUSP Place 1-2 sprays into the nose in the morning and at bedtime.   Omega-3 Fatty Acids (OMEGA 3 PO) Take 1 tablet by mouth as needed.   omeprazole (PRILOSEC) 40 MG capsule Take 1 capsule (40 mg total) by mouth daily.   Polyethyl Glycol-Propyl Glycol (SYSTANE FREE OP) Apply 1 drop to eye daily. Uses thera work for dr eyes per pt   Potassium 95 MG TABS Take 95 mg by mouth daily.   ramipril (ALTACE) 2.5 MG capsule TAKE 1 CAPSULE BY MOUTH  DAILY   simvastatin (ZOCOR) 20 MG tablet Take 1 tablet (20 mg total) by mouth daily.   Stinging Nettle 2 % POWD Take 1 tablet by mouth daily at 12 noon. Reported on 12/16/2015   sucralfate (CARAFATE) 1 GM/10ML suspension SHAKE LIQUID AND TAKE 10 ML(1 GRAM) BY MOUTH FOUR TIMES DAILY WITH MEALS AND AT BEDTIME   traZODone (DESYREL) 50 MG tablet Take 0.5-1 tablets (25-50 mg total) by mouth at bedtime as needed. for sleep (Patient taking differently: Take 25-50 mg by mouth at bedtime as needed for sleep. for sleep)   TURMERIC PO Take 800 mg by mouth daily as needed (for well being). Reported on 12/16/2015   VENTOLIN HFA 108 (90 Base) MCG/ACT inhaler Inhale 2 puffs into the lungs every 6 (six) hours as needed for wheezing or shortness of breath.   No facility-administered encounter medications on file as of 09/16/2022.    Allergies (verified) Peanut-containing drug products, Eggs or egg-derived products, Milk-related compounds, Actos [pioglitazone],  Soy allergy, and Vicodin [hydrocodone-acetaminophen]   History: Past Medical History:  Diagnosis Date   Acid reflux    Allergic rhinitis    Allergy    Anemia    Arthritis    per MD- pt states no s/s of this    Asthma    Bilateral sciatica    Cataract    removed both eyes    Chronic bilateral low back pain    Colon polyps    Diverticulosis    DM type 2 (diabetes mellitus, type 2) (HCC)    Environmental allergies    flowers, pollen, trees   GERD (gastroesophageal reflux disease)    Hyperlipidemia    Low back pain    Migraine    past hx- control as allergies are  controlled    Osteoporosis    PONV (postoperative nausea and vomiting)    Past Surgical History:  Procedure Laterality Date   CATARACT EXTRACTION, BILATERAL Bilateral 2012   CESAREAN SECTION     x 2   COLONOSCOPY     > 10 yrs ago- unsure if removed polyps as was told sev.different things after this colon    DENTAL SURGERY     molars removed    DILATION AND CURETTAGE OF UTERUS     NASAL SINUS SURGERY     TUBAL LIGATION     Family History  Problem Relation Age of Onset   Asthma Father    Emphysema Father    Alcohol abuse Father    Diabetes Mellitus II Brother        x 2   Hyperlipidemia Brother    Hypertension Brother    Pancreatic cancer Brother    Prostate cancer Brother    Diabetes Brother    Heart disease Maternal Grandmother        hardening of the arteries   Heart murmur Daughter    Glaucoma Brother    Breast cancer Neg Hx    Colon cancer Neg Hx    Colon polyps Neg Hx    Esophageal cancer Neg Hx    Rectal cancer Neg Hx    Stomach cancer Neg Hx    Social History   Socioeconomic History   Marital status: Married    Spouse name: Not on file   Number of children: 2   Years of education: 16   Highest education level: Not on file  Occupational History   Occupation: Retired  Tobacco Use   Smoking status: Never   Smokeless tobacco: Never  Vaping Use   Vaping Use: Never used  Substance  and Sexual Activity   Alcohol use: No    Alcohol/week: 0.0 standard drinks of alcohol   Drug use: No   Sexual activity: Not on file  Other Topics Concern   Not on file  Social History Narrative      Married 1 son one daughter, relocated to [place] after living in [place] for many years. She is a Writer of [employer]   No caffeine.          Lives at home with her husband and brother.   Right-handed.   No caffeine use.   Social Determinants of Health   Financial Resource Strain: Low Risk  (09/16/2022)   Overall Financial Resource Strain (CARDIA)    Difficulty of Paying Living Expenses: Not hard at all  Food Insecurity: No Food Insecurity (09/16/2022)   Hunger Vital Sign    Worried About Running Out of Food in the Last Year: Never true    Ran Out of Food in the Last Year: Never true  Transportation Needs: No Transportation Needs (09/16/2022)   PRAPARE - Hydrologist (Medical): No    Lack of Transportation (Non-Medical): No  Physical Activity: Insufficiently Active (09/16/2022)   Exercise Vital Sign    Days of Exercise per Week: 4 days    Minutes of Exercise per Session: 30 min  Stress: No Stress Concern Present (09/16/2022)   [place]    Feeling of Stress : Not at all  Social Connections: [place] (09/16/2022)   Social Connection and Isolation Panel [NHANES]    Frequency of Communication with Friends and Family: More than three times a  week    Frequency of Social Gatherings with Friends and Family: More than three times a week    Attends Religious Services: More than 4 times per year    Active Member of [employer] or Organizations: Yes    Attends Music therapist: More than 4 times per year    Marital Status: Married    Tobacco Counseling Counseling given: Not Answered   Clinical Intake: Nutrition Risk Assessment:  Has the patient  had any N/V/D within the last 2 months?  No  Does the patient have any non-healing wounds?  No  Has the patient had any unintentional weight loss or weight gain?  No   Diabetes:  Is the patient diabetic?  Yes  If diabetic, was a CBG obtained today?  Yes CBG 129 Taken by patient Did the patient bring in their glucometer from home?  No  How often do you monitor your CBG's? 4 x daily.   Financial Strains and Diabetes Management:  Are you having any financial strains with the device, your supplies or your medication? No .  Does the patient want to be seen by Chronic Care Management for management of their diabetes?  No  Would the patient like to be referred to a Nutritionist or for Diabetic Management?  No   Diabetic Exams:  Diabetic Eye Exam: Completed No. Overdue for diabetic eye exam. Pt has been advised about the importance in completing this exam. A referral has been placed today. Message sent to referral coordinator for scheduling purposes. Advised pt to expect a call from office referred to regarding appt.  Diabetic Foot Exam: Completed No. Pt has been advised about the importance in completing this exam. Pt is scheduled for diabetic foot exam on Followed by PCP.   Pre-visit preparation completed: No  Pain : No/denies pain     BMI - recorded: 22.97 Nutritional Status: BMI of 19-24  Normal Nutritional Risks: None Diabetes: Yes CBG done?: Yes (CBG 133 Taken by patient) CBG resulted in Enter/ Edit results?: Yes Did pt. bring in CBG monitor from home?: No  How often do you need to have someone help you when you read instructions, pamphlets, or other written materials from your doctor or pharmacy?: 1 - Never  Diabetic?  Yes  Interpreter Needed?: No  Information entered by :: Rolene Arbour LPN   Activities of Daily Living    09/16/2022    3:33 PM 02/15/2022    1:31 PM  In your present state of health, do you have any difficulty performing the following activities:   Hearing? 0 1  Vision? 0 0  Difficulty concentrating or making decisions? 0 0  Walking or climbing stairs? 0 0  Dressing or bathing? 0 0  Doing errands, shopping? 0 0  Preparing Food and eating ? N   Using the Toilet? N   In the past six months, have you accidently leaked urine? N   Do you have problems with loss of bowel control? N   Managing your Medications? N   Managing your Finances? N   Housekeeping or managing your Housekeeping? N     Patient Care Team: [name], NP as PCP - General (Family Medicine)  Indicate any recent Medical Services you may have received from other than [facility] providers in the past year (date may be approximate).     Assessment:   This is a routine wellness examination for [place].  Hearing/Vision screen Hearing Screening - Comments:: Denies hearing difficulties   Vision Screening -  Comments:: Wears rx glasses - up to date with routine eye exams with  [place] issues and exercise activities discussed: Current Exercise Habits: Home exercise routine, Type of exercise: walking, Time (Minutes): 30, Frequency (Times/Week): 4, Weekly Exercise (Minutes/Week): 120, Intensity: Moderate, Exercise limited by: None identified   Goals Addressed               This Visit's Progress     No current goals (pt-stated)         Depression Screen    09/16/2022    3:31 PM 04/08/2022    1:28 PM 02/15/2022    1:30 PM 11/16/2021    2:30 PM 09/09/2021    3:36 PM 09/09/2021    3:33 PM 02/11/2021    1:09 PM  PHQ 2/9 Scores  PHQ - 2 Score 0 0 0 0 0 0 0    Fall Risk    09/16/2022    3:35 PM 04/08/2022    1:28 PM 02/15/2022    1:31 PM 11/16/2021    2:30 PM 09/20/2021   12:17 PM  [place] in the past year? 0 0 0 1 0  Number falls in past yr: 0 0 0 0 0  Injury with Fall? 0 0 0 0 0  Risk for fall due to : No Fall Risks No Fall Risks   No Fall Risks  Follow up Falls prevention discussed Education provided;Falls prevention discussed    Falls evaluation completed    FALL RISK PREVENTION PERTAINING TO THE HOME:  Any stairs in or around the home? No  If so, are there any without handrails? No  Home free of loose throw rugs in walkways, pet beds, electrical cords, etc? Yes  Adequate lighting in your home to reduce risk of falls? Yes   ASSISTIVE DEVICES UTILIZED TO PREVENT FALLS:  Life alert? No  Use of a cane, walker or w/c? No  Grab bars in the bathroom? Yes Shower chair or bench in shower? No  Elevated toilet seat or a handicapped toilet? No   TIMED UP AND GO:  Was the test performed? No . Audio Visit  Cognitive Function:        09/16/2022    3:35 PM  6CIT Screen  What Year? 0 points  What month? 0 points  What time? 0 points  Count back from 20 0 points  Months in reverse 0 points  Repeat phrase 0 points  Total Score 0 points    Immunizations Immunization History  Administered Date(s) Administered   PFIZER Comirnaty(Gray Top)Covid-19 Tri-Sucrose Vaccine 05/15/2021   PFIZER(Purple Top)SARS-COV-2 Vaccination 04/30/2020, 05/25/2020, 12/15/2020   PPD Test 05/13/2009, 05/27/2009, 05/06/2011   Pneumococcal Conjugate-13 01/27/2014    TDAP status: Up to date  Flu Vaccine status: Declined, Education has been provided regarding the importance of this vaccine but patient still declined. Advised may receive this vaccine at local pharmacy or Health Dept. Aware to provide a copy of the vaccination record if obtained from local pharmacy or Health Dept. Verbalized acceptance and understanding.  Pneumococcal vaccine status: Up to date  Covid-19 vaccine status: Completed vaccines  Qualifies for Shingles Vaccine? Yes   Zostavax completed No   Shingrix Completed?: No.    Education has been provided regarding the importance of this vaccine. Patient has been advised to call insurance company to determine out of pocket expense if they have not yet received this vaccine. Advised may also receive vaccine at local  pharmacy or Health  Dept. Verbalized acceptance and understanding.  Screening Tests Health Maintenance  Topic Date Due   Diabetic kidney evaluation - Urine ACR  08/16/2016   OPHTHALMOLOGY EXAM  09/04/2021   FOOT EXAM  02/11/2022   COVID-19 Vaccine (5 - Pfizer series) 10/02/2022 (Originally 07/10/2021)   Zoster Vaccines- Shingrix (1 of 2) 12/16/2022 (Originally 09/02/1997)   Pneumonia Vaccine 74+ Years old (2 - PPSV23 or PCV20) 02/15/2023 (Originally 01/27/2015)   TETANUS/TDAP  02/15/2023 (Originally [birth year]02/1966)   INFLUENZA VACCINE  03/19/2023 (Originally 07/19/2022)   HEMOGLOBIN A1C  11/17/2022   Diabetic kidney evaluation - GFR measurement  07/05/2023   MAMMOGRAM  08/17/2023   COLONOSCOPY (Pts 45-64yrs Insurance coverage will need to be confirmed)  03/09/2030   DEXA SCAN  Completed   Hepatitis C Screening  Completed   HPV VACCINES  Aged Out    Health Maintenance  Health Maintenance Due  Topic Date Due   Diabetic kidney evaluation - Urine ACR  08/16/2016   OPHTHALMOLOGY EXAM  09/04/2021   FOOT EXAM  02/11/2022    Colorectal cancer screening: Type of screening: Colonoscopy. Completed 03/09/20. Repeat every 10 years  Mammogram status: Completed 08/16/22. Repeat every year  Bone Density status: Completed 08/08/22. Results reflect: Bone density results: OSTEOPOROSIS. Repeat every   years.  Lung Cancer Screening: (Low Dose CT Chest recommended if Age 81-80 years, 30 pack-year currently smoking OR have quit w/in 15years.) does not qualify.     Additional Screening:  Hepatitis C Screening: does qualify; Completed 10/18/18  Vision Screening: Recommended annual ophthalmology exams for early detection of glaucoma and other disorders of the eye. Is the patient up to date with their annual eye exam?  Yes  Who is the provider or what is the name of the office in which the patient attends annual eye exams? [facility] If pt is not established with a provider, would they like to be  referred to a provider to establish care? No .   Dental Screening: Recommended annual dental exams for proper oral hygiene  Community Resource Referral / Chronic Care Management:  CRR required this visit?  No   CCM required this visit?  No      Plan:     I have personally reviewed and noted the following in the patient's chart:   Medical and social history Use of alcohol, tobacco or illicit drugs  Current medications and supplements including opioid prescriptions. Patient is not currently taking opioid prescriptions. Functional ability and status Nutritional status Physical activity Advanced directives List of other physicians Hospitalizations, surgeries, and ER visits in previous 12 months Vitals Screenings to include cognitive, depression, and falls Referrals and appointments  In addition, I have reviewed and discussed with patient certain preventive protocols, quality metrics, and best practice recommendations. A written personalized care plan for preventive services as well as general preventive health recommendations were provided to patient.     Criselda Peaches, LPN   2/76/1470   Nurse Notes: Patient due Diabetic Kidney Evaluation- Urine ACR

## 2022-09-16 NOTE — Patient Instructions (Addendum)
Kimberly Lam , Thank you for taking time to come for your Medicare Wellness Visit. I appreciate your ongoing commitment to your health goals. Please review the following plan we discussed and let me know if I can assist you in the future.   These are the goals we discussed:  Goals       Exercise 150 min/wk Moderate Activity      No current goals (pt-stated)        This is a list of the screening recommended for you and due dates:  Health Maintenance  Topic Date Due   Yearly kidney health urinalysis for diabetes  08/16/2016   Eye exam for diabetics  09/04/2021   Complete foot exam   02/11/2022   COVID-19 Vaccine (5 - Pfizer series) 10/02/2022*   Zoster (Shingles) Vaccine (1 of 2) 12/16/2022*   Pneumonia Vaccine (2 - PPSV23 or PCV20) 02/15/2023*   Tetanus Vaccine  02/15/2023*   Flu Shot  03/19/2023*   Hemoglobin A1C  11/17/2022   Yearly kidney function blood test for diabetes  07/05/2023   Mammogram  08/17/2023   Colon Cancer Screening  03/09/2030   DEXA scan (bone density measurement)  Completed   Hepatitis C Screening: USPSTF Recommendation to screen - Ages 60-79 yo.  Completed   HPV Vaccine  Aged Out  *Topic was postponed. The date shown is not the original due date.    Advanced directives: Advance directive discussed with you today. Even though you declined this today, please call our office should you change your mind, and we can give you the proper paperwork for you to fill out.   Conditions/risks identified: None  Next appointment: Follow up in one year for your annual wellness visit    Preventive Care 65 Years and Older, Female Preventive care refers to lifestyle choices and visits with your health care provider that can promote health and wellness. What does preventive care include? A yearly physical exam. This is also called an annual well check. Dental exams once or twice a year. Routine eye exams. Ask your health care provider how often you should have your eyes  checked. Personal lifestyle choices, including: Daily care of your teeth and gums. Regular physical activity. Eating a healthy diet. Avoiding tobacco and drug use. Limiting alcohol use. Practicing safe sex. Taking low-dose aspirin every day. Taking vitamin and mineral supplements as recommended by your health care provider. What happens during an annual well check? The services and screenings done by your health care provider during your annual well check will depend on your age, overall health, lifestyle risk factors, and family history of disease. Counseling  Your health care provider may ask you questions about your: Alcohol use. Tobacco use. Drug use. Emotional well-being. Home and relationship well-being. Sexual activity. Eating habits. History of falls. Memory and ability to understand (cognition). Work and work Statistician. Reproductive health. Screening  You may have the following tests or measurements: Height, weight, and BMI. Blood pressure. Lipid and cholesterol levels. These may be checked every 5 years, or more frequently if you are over 36 years old. Skin check. Lung cancer screening. You may have this screening every year starting at age 31 if you have a 30-pack-year history of smoking and currently smoke or have quit within the past 15 years. Fecal occult blood test (FOBT) of the stool. You may have this test every year starting at age 12. Flexible sigmoidoscopy or colonoscopy. You may have a sigmoidoscopy every 5 years or a colonoscopy every 10  years starting at age 51. Hepatitis C blood test. Hepatitis B blood test. Sexually transmitted disease (STD) testing. Diabetes screening. This is done by checking your blood sugar (glucose) after you have not eaten for a while (fasting). You may have this done every 1-3 years. Bone density scan. This is done to screen for osteoporosis. You may have this done starting at age 28. Mammogram. This may be done every 1-2  years. Talk to your health care provider about how often you should have regular mammograms. Talk with your health care provider about your test results, treatment options, and if necessary, the need for more tests. Vaccines  Your health care provider may recommend certain vaccines, such as: Influenza vaccine. This is recommended every year. Tetanus, diphtheria, and acellular pertussis (Tdap, Td) vaccine. You may need a Td booster every 10 years. Zoster vaccine. You may need this after age 52. Pneumococcal 13-valent conjugate (PCV13) vaccine. One dose is recommended after age 17. Pneumococcal polysaccharide (PPSV23) vaccine. One dose is recommended after age 15. Talk to your health care provider about which screenings and vaccines you need and how often you need them. This information is not intended to replace advice given to you by your health care provider. Make sure you discuss any questions you have with your health care provider. Document Released: 01/01/2016 Document Revised: 08/24/2016 Document Reviewed: 10/06/2015 Elsevier Interactive Patient Education  2017 [place] Prevention in the Home Falls can cause injuries. They can happen to people of all ages. There are many things you can do to make your home safe and to help prevent falls. What can I do on the outside of my home? Regularly fix the edges of walkways and driveways and fix any cracks. Remove anything that might make you trip as you walk through a door, such as a raised step or threshold. Trim any bushes or trees on the path to your home. Use bright outdoor lighting. Clear any walking paths of anything that might make someone trip, such as rocks or tools. Regularly check to see if handrails are loose or broken. Make sure that both sides of any steps have handrails. Any raised decks and porches should have guardrails on the edges. Have any leaves, snow, or ice cleared regularly. Use sand or salt on walking paths  during winter. Clean up any spills in your garage right away. This includes oil or grease spills. What can I do in the bathroom? Use night lights. Install grab bars by the toilet and in the tub and shower. Do not use towel bars as grab bars. Use non-skid mats or decals in the tub or shower. If you need to sit down in the shower, use a plastic, non-slip stool. Keep the floor dry. Clean up any water that spills on the floor as soon as it happens. Remove soap buildup in the tub or shower regularly. Attach bath mats securely with double-sided non-slip rug tape. Do not have throw rugs and other things on the floor that can make you trip. What can I do in the bedroom? Use night lights. Make sure that you have a light by your bed that is easy to reach. Do not use any sheets or blankets that are too big for your bed. They should not hang down onto the floor. Have a firm chair that has side arms. You can use this for support while you get dressed. Do not have throw rugs and other things on the floor that can make you trip.  What can I do in the kitchen? Clean up any spills right away. Avoid walking on wet floors. Keep items that you use a lot in easy-to-reach places. If you need to reach something above you, use a strong step stool that has a grab bar. Keep electrical cords out of the way. Do not use floor polish or wax that makes floors slippery. If you must use wax, use non-skid floor wax. Do not have throw rugs and other things on the floor that can make you trip. What can I do with my stairs? Do not leave any items on the stairs. Make sure that there are handrails on both sides of the stairs and use them. Fix handrails that are broken or loose. Make sure that handrails are as long as the stairways. Check any carpeting to make sure that it is firmly attached to the stairs. Fix any carpet that is loose or worn. Avoid having throw rugs at the top or bottom of the stairs. If you do have throw  rugs, attach them to the floor with carpet tape. Make sure that you have a light switch at the top of the stairs and the bottom of the stairs. If you do not have them, ask someone to add them for you. What else can I do to help prevent falls? Wear shoes that: Do not have high heels. Have rubber bottoms. Are comfortable and fit you well. Are closed at the toe. Do not wear sandals. If you use a stepladder: Make sure that it is fully opened. Do not climb a closed stepladder. Make sure that both sides of the stepladder are locked into place. Ask someone to hold it for you, if possible. Clearly mark and make sure that you can see: Any grab bars or handrails. First and last steps. Where the edge of each step is. Use tools that help you move around (mobility aids) if they are needed. These include: Canes. Walkers. Scooters. Crutches. Turn on the lights when you go into a dark area. Replace any light bulbs as soon as they burn out. Set up your furniture so you have a clear path. Avoid moving your furniture around. If any of your floors are uneven, fix them. If there are any pets around you, be aware of where they are. Review your medicines with your doctor. Some medicines can make you feel dizzy. This can increase your chance of falling. Ask your doctor what other things that you can do to help prevent falls. This information is not intended to replace advice given to you by your health care provider. Make sure you discuss any questions you have with your health care provider. Document Released: 10/01/2009 Document Revised: 05/12/2016 Document Reviewed: 01/09/2015 Elsevier Interactive Patient Education  2017 [employer].

## 2022-09-20 ENCOUNTER — Other Ambulatory Visit: Payer: Self-pay

## 2022-09-20 ENCOUNTER — Telehealth: Payer: Self-pay | Admitting: Allergy

## 2022-09-20 MED ORDER — AZELASTINE-FLUTICASONE 137-50 MCG/ACT NA SUSP
1.0000 | Freq: Two times a day (BID) | NASAL | 1 refills | Status: DC | PRN
Start: 2022-09-20 — End: 2024-01-22

## 2022-09-20 MED ORDER — FLUTICASONE PROPIONATE 50 MCG/ACT NA SUSP: 2.0000 | Freq: Every day | NASAL | 1 refills | Status: DC

## 2022-09-20 MED ORDER — AZELASTINE HCL 137 MCG/SPRAY NA SOLN: NASAL | 1 refills | Status: DC

## 2022-09-20 NOTE — Telephone Encounter (Signed)
Patient called and needs her dulera called into pharmacy [phone]

## 2022-09-20 NOTE — Telephone Encounter (Signed)
Sent in 3 months supply of dymista to mail order pharmacy.

## 2022-09-20 NOTE — Telephone Encounter (Signed)
Please call patient and ask her if she's having issues with her breathing now and is that why she requested the [facility]?

## 2022-09-20 NOTE — Telephone Encounter (Signed)
She states she's not having any issues with her breathing right now. She's asking for a script for the nasal spry Dymista not Dulera, because the other nasal spray isn't covered by her insurance.  Roxy Manns [phone]

## 2022-09-20 NOTE — Telephone Encounter (Signed)
Your last avs stated she could not afford the dulera please advise

## 2022-09-20 NOTE — Addendum Note (Signed)
Addended by: Garnet Sierras on: 09/20/2022 02:48 PM   Modules accepted: Orders

## 2022-09-22 ENCOUNTER — Ambulatory Visit (INDEPENDENT_AMBULATORY_CARE_PROVIDER_SITE_OTHER): Payer: Medicare HMO

## 2022-09-22 DIAGNOSIS — J343 Hypertrophy of nasal turbinates: Secondary | ICD-10-CM | POA: Diagnosis not present

## 2022-09-22 DIAGNOSIS — J3489 Other specified disorders of nose and nasal sinuses: Secondary | ICD-10-CM | POA: Diagnosis not present

## 2022-09-22 DIAGNOSIS — J309 Allergic rhinitis, unspecified: Secondary | ICD-10-CM

## 2022-10-04 ENCOUNTER — Ambulatory Visit (INDEPENDENT_AMBULATORY_CARE_PROVIDER_SITE_OTHER): Payer: Medicare HMO

## 2022-10-04 DIAGNOSIS — J309 Allergic rhinitis, unspecified: Secondary | ICD-10-CM

## 2022-10-05 NOTE — Telephone Encounter (Signed)
PA has been submitted and approved for Azelastine-Fluticasone. PA has been faxed to patients pharmacy, labeled, and placed in bulk scanning.

## 2022-10-18 ENCOUNTER — Ambulatory Visit (INDEPENDENT_AMBULATORY_CARE_PROVIDER_SITE_OTHER): Payer: Medicare HMO | Admitting: *Deleted

## 2022-10-18 DIAGNOSIS — J309 Allergic rhinitis, unspecified: Secondary | ICD-10-CM | POA: Diagnosis not present

## 2022-10-23 ENCOUNTER — Other Ambulatory Visit: Payer: Self-pay | Admitting: Family Medicine

## 2022-10-24 ENCOUNTER — Encounter: Payer: Self-pay | Admitting: Adult Health

## 2022-10-24 ENCOUNTER — Ambulatory Visit: Payer: Medicare HMO | Admitting: Podiatry

## 2022-10-24 ENCOUNTER — Ambulatory Visit (INDEPENDENT_AMBULATORY_CARE_PROVIDER_SITE_OTHER): Payer: Medicare HMO

## 2022-10-24 ENCOUNTER — Other Ambulatory Visit: Payer: Self-pay

## 2022-10-24 DIAGNOSIS — J309 Allergic rhinitis, unspecified: Secondary | ICD-10-CM | POA: Diagnosis not present

## 2022-10-24 DIAGNOSIS — E119 Type 2 diabetes mellitus without complications: Secondary | ICD-10-CM

## 2022-10-24 MED ORDER — RYALTRIS 665-25 MCG/ACT NA SUSP: 1.0000 | Freq: Two times a day (BID) | NASAL | 1 refills | Status: DC

## 2022-10-24 NOTE — Telephone Encounter (Signed)
Called patient to find out if this is actually needed. Patient states that she got a call from [facility] but it is more expensive through them. Patient requested refill of astelin until [name] could be filled with [facility].

## 2022-10-25 ENCOUNTER — Other Ambulatory Visit: Payer: Self-pay

## 2022-10-25 MED ORDER — RAMIPRIL 2.5 MG PO CAPS: 2.5000 mg | ORAL_CAPSULE | Freq: Every day | ORAL | 3 refills | Status: DC

## 2022-10-26 NOTE — Progress Notes (Signed)
EXP 10/27/23

## 2022-10-27 DIAGNOSIS — J3089 Other allergic rhinitis: Secondary | ICD-10-CM | POA: Diagnosis not present

## 2022-10-28 DIAGNOSIS — J301 Allergic rhinitis due to pollen: Secondary | ICD-10-CM | POA: Diagnosis not present

## 2022-10-31 DIAGNOSIS — J302 Other seasonal allergic rhinitis: Secondary | ICD-10-CM | POA: Diagnosis not present

## 2022-11-01 ENCOUNTER — Telehealth: Payer: Self-pay | Admitting: Allergy

## 2022-11-01 MED ORDER — AZELASTINE HCL 0.1 % NA SOLN
NASAL | 3 refills | Status: DC
Start: 2022-11-01 — End: 2023-03-20

## 2022-11-01 MED ORDER — FLUTICASONE PROPIONATE 50 MCG/ACT NA SUSP: 2.0000 | Freq: Every day | NASAL | 1 refills | Status: DC

## 2022-11-01 NOTE — Telephone Encounter (Signed)
[place] - spoke to Heather/800 [phone] - DOB verified - advised per provider's 09/14/22 office notes  - patient is to discontinue Azelastine and begin [name]. Patient was using the Azelastine until she received the [name].    Heather transferred me to [facility], Pharmacist - verified/confirmed [name] prescription was suppose to be sent to another pharmacy - [facility].  Melissa advised she will discontinue Azelastine, Fluticasone, Azelastine - Fluticasone due to patient is suppose to be using only [name].   Electronically sending in Ryatris prescription to [facility].  Called patient - DOB verified - advised of the  above notation. Patient advised [name] would cost her $45 monthly and she can not afford that. Patient stated she is continuing using Azelastine and Fluticasone separately because she doesn't have to pay anything.  Patient advised I would contact [facility] to update them then forward message to provider as update.  Called [facility] - spoke to [place], Pharmacist - DOB verified - advised of the above notation. Abigail Butts advised Azelastine - Fluticasone combination would be to pricey for patient - separate is better. Abigail Butts stated a new prescription would need to be sent as well - the others were discontinued by [facility]. I advised Abigail Butts I would now.  I also discontinued [name] prescription.  Patient verbalized understanding, no further questions.

## 2022-11-01 NOTE — Telephone Encounter (Signed)
Rep from [facility] called to get clarification on whether patient is supposed to be using azelastine nasal spray or ryaltris.   Best contact number: [phone]  Ref #: [id number]

## 2022-11-02 ENCOUNTER — Other Ambulatory Visit: Payer: Self-pay | Admitting: Adult Health

## 2022-11-02 DIAGNOSIS — M810 Age-related osteoporosis without current pathological fracture: Secondary | ICD-10-CM

## 2022-11-02 DIAGNOSIS — E119 Type 2 diabetes mellitus without complications: Secondary | ICD-10-CM

## 2022-11-17 ENCOUNTER — Ambulatory Visit: Payer: Medicare HMO | Admitting: Adult Health

## 2022-11-21 ENCOUNTER — Ambulatory Visit (INDEPENDENT_AMBULATORY_CARE_PROVIDER_SITE_OTHER): Payer: Medicare HMO

## 2022-11-21 DIAGNOSIS — J309 Allergic rhinitis, unspecified: Secondary | ICD-10-CM | POA: Diagnosis not present

## 2022-11-22 DIAGNOSIS — H1012 Acute atopic conjunctivitis, left eye: Secondary | ICD-10-CM | POA: Diagnosis not present

## 2022-11-26 ENCOUNTER — Other Ambulatory Visit: Payer: Self-pay | Admitting: Adult Health

## 2022-12-09 ENCOUNTER — Ambulatory Visit (INDEPENDENT_AMBULATORY_CARE_PROVIDER_SITE_OTHER): Payer: Medicare HMO | Admitting: *Deleted

## 2022-12-09 DIAGNOSIS — J309 Allergic rhinitis, unspecified: Secondary | ICD-10-CM

## 2022-12-28 ENCOUNTER — Ambulatory Visit (INDEPENDENT_AMBULATORY_CARE_PROVIDER_SITE_OTHER): Payer: Medicare HMO

## 2022-12-28 DIAGNOSIS — J309 Allergic rhinitis, unspecified: Secondary | ICD-10-CM

## 2022-12-30 ENCOUNTER — Other Ambulatory Visit: Payer: Self-pay | Admitting: Allergy

## 2022-12-30 ENCOUNTER — Other Ambulatory Visit: Payer: Self-pay | Admitting: Adult Health

## 2022-12-30 DIAGNOSIS — G4700 Insomnia, unspecified: Secondary | ICD-10-CM

## 2022-12-30 DIAGNOSIS — E119 Type 2 diabetes mellitus without complications: Secondary | ICD-10-CM

## 2022-12-30 DIAGNOSIS — K2100 Gastro-esophageal reflux disease with esophagitis, without bleeding: Secondary | ICD-10-CM

## 2023-01-02 DIAGNOSIS — H903 Sensorineural hearing loss, bilateral: Secondary | ICD-10-CM | POA: Diagnosis not present

## 2023-01-03 ENCOUNTER — Ambulatory Visit (INDEPENDENT_AMBULATORY_CARE_PROVIDER_SITE_OTHER): Payer: Medicare HMO

## 2023-01-03 DIAGNOSIS — J309 Allergic rhinitis, unspecified: Secondary | ICD-10-CM

## 2023-01-11 ENCOUNTER — Ambulatory Visit (INDEPENDENT_AMBULATORY_CARE_PROVIDER_SITE_OTHER): Payer: Medicare HMO

## 2023-01-11 DIAGNOSIS — J309 Allergic rhinitis, unspecified: Secondary | ICD-10-CM

## 2023-01-16 ENCOUNTER — Other Ambulatory Visit: Payer: Self-pay | Admitting: Adult Health

## 2023-01-16 DIAGNOSIS — E119 Type 2 diabetes mellitus without complications: Secondary | ICD-10-CM

## 2023-01-17 NOTE — Telephone Encounter (Signed)
Patient need to schedule an ov for more refills. 

## 2023-01-18 ENCOUNTER — Ambulatory Visit (INDEPENDENT_AMBULATORY_CARE_PROVIDER_SITE_OTHER): Payer: Medicare HMO

## 2023-01-18 ENCOUNTER — Ambulatory Visit: Payer: Medicare HMO | Admitting: Allergy

## 2023-01-18 DIAGNOSIS — J309 Allergic rhinitis, unspecified: Secondary | ICD-10-CM | POA: Diagnosis not present

## 2023-01-23 ENCOUNTER — Ambulatory Visit (INDEPENDENT_AMBULATORY_CARE_PROVIDER_SITE_OTHER): Payer: Medicare HMO

## 2023-01-23 DIAGNOSIS — J309 Allergic rhinitis, unspecified: Secondary | ICD-10-CM

## 2023-01-30 ENCOUNTER — Ambulatory Visit: Payer: Medicare HMO | Admitting: Allergy

## 2023-02-06 ENCOUNTER — Other Ambulatory Visit: Payer: Self-pay

## 2023-02-06 ENCOUNTER — Encounter: Payer: Self-pay | Admitting: Allergy

## 2023-02-06 ENCOUNTER — Ambulatory Visit: Payer: Medicare HMO | Admitting: Allergy

## 2023-02-06 ENCOUNTER — Ambulatory Visit: Payer: Self-pay

## 2023-02-06 VITALS — BP 124/64 | HR 90 | Temp 97.3°F | Resp 20 | Ht 64.0 in | Wt 138.5 lb

## 2023-02-06 DIAGNOSIS — J3489 Other specified disorders of nose and nasal sinuses: Secondary | ICD-10-CM | POA: Diagnosis not present

## 2023-02-06 DIAGNOSIS — J4540 Moderate persistent asthma, uncomplicated: Secondary | ICD-10-CM

## 2023-02-06 DIAGNOSIS — K219 Gastro-esophageal reflux disease without esophagitis: Secondary | ICD-10-CM

## 2023-02-06 DIAGNOSIS — J3089 Other allergic rhinitis: Secondary | ICD-10-CM

## 2023-02-06 DIAGNOSIS — J309 Allergic rhinitis, unspecified: Secondary | ICD-10-CM | POA: Diagnosis not present

## 2023-02-06 DIAGNOSIS — T7800XD Anaphylactic reaction due to unspecified food, subsequent encounter: Secondary | ICD-10-CM

## 2023-02-06 MED ORDER — ALBUTEROL SULFATE HFA 108 (90 BASE) MCG/ACT IN AERS: 2.0000 | INHALATION_SPRAY | RESPIRATORY_TRACT | 1 refills | Status: DC | PRN

## 2023-02-06 NOTE — Assessment & Plan Note (Signed)
Stable. Continue dietary and lifestyle modifications. Continue omeprazole 40 mg once a day to control reflux.

## 2023-02-06 NOTE — Assessment & Plan Note (Signed)
Past history - started AIT on 10/27/2015 (TREE/GRASS-WEED-MITE-CAT-DOG/MOLD-CR) every 2 weeks now unable to go to every 3-4 weeks.  Interim history - still having sinus issues. Saw ENT recommended surgery. [name] not covered. Continue environmental control measures. Continue allergy injections every 2 weeks - given today.  Continue Singulair (montelukast) 62m daily at night. Use Flonase (fluticasone) nasal spray 1 spray per nostril twice a day as needed for nasal congestion.  Use azelastine nasal spray 1-2 sprays per nostril twice a day as needed for runny nose/drainage. Use over the counter antihistamines such as Zyrtec (cetirizine), Claritin (loratadine), Allegra (fexofenadine), or Xyzal (levocetirizine) daily as needed. May switch antihistamines every few months. Nasal saline spray (i.e., Simply Saline) or nasal saline lavage (i.e., NeilMed) is recommended as needed and prior to medicated nasal sprays.

## 2023-02-06 NOTE — Assessment & Plan Note (Signed)
Past history - soy caused increased rhinitis symptoms. Interim history - No reactions. Can't afford Epipen.  Continue to avoid peanuts, milk, and soy.   For mild symptoms you can take over the counter antihistamines such as Benadryl and monitor symptoms closely. If symptoms worsen or if you have severe symptoms including breathing issues, throat closure, significant swelling, whole body hives, severe diarrhea and vomiting, lightheadedness then seek immediate medical care.

## 2023-02-06 NOTE — Assessment & Plan Note (Signed)
Past history - Felt better with [facility] but could not afford to purchase after samples ran out. Reports that inhalers cause her mouth to get sores and become white. Interim history -  noted chest tightness lately with exertion. Albuterol was over $40.  Today's spirometry showed restriction.  Daily controller medication(s): Continue Singulair (montelukast) 10 mg daily at night. May use Airsupra rescue inhaler 2 puffs every 4 to 6 hours as needed for shortness of breath, chest tightness, coughing, and wheezing. Do not use more than 12 puffs in 24 hours. May use Airsupra rescue inhaler 2 puffs 5 to 15 minutes prior to strenuous physical activities. Monitor frequency of use. Rinse mouth after each use. Sample given.  Use this inhaler while we figure out how much albuterol is doing to cost you. May use albuterol rescue inhaler 2 puffs every 4 to 6 hours as needed for shortness of breath, chest tightness, coughing, and wheezing. May use albuterol rescue inhaler 2 puffs 5 to 15 minutes prior to strenuous physical activities. Monitor frequency of use.  Let me know if this is not covered - sent into mail order pharmacy.  Get spirometry at next visit.

## 2023-02-06 NOTE — Assessment & Plan Note (Signed)
Past history - Saw ENT in the past (no intervention). Interim history - unchanged. Follow up with ENT regarding the sinus surgery.

## 2023-02-06 NOTE — Patient Instructions (Addendum)
Asthma Daily controller medication(s): Continue Singulair (montelukast) 10 mg daily at night.  May use Airsupra rescue inhaler 2 puffs every 4 to 6 hours as needed for shortness of breath, chest tightness, coughing, and wheezing. Do not use more than 12 puffs in 24 hours. May use Airsupra rescue inhaler 2 puffs 5 to 15 minutes prior to strenuous physical activities. Monitor frequency of use. Rinse mouth after each use. Sample given.  Use this inhaler while we figure out how much albuterol is doing to cost you.  May use albuterol rescue inhaler 2 puffs every 4 to 6 hours as needed for shortness of breath, chest tightness, coughing, and wheezing. May use albuterol rescue inhaler 2 puffs 5 to 15 minutes prior to strenuous physical activities. Monitor frequency of use.  Let me know if this is not covered. Asthma control goals:  Full participation in all desired activities (may need albuterol before activity) Albuterol use two times or less a week on average (not counting use with activity) Cough interfering with sleep two times or less a month Oral steroids no more than once a year No hospitalizations   Environmental allergies Continue environmental control measures. Continue allergy injections every 2 weeks.  Continue Singulair (montelukast) 65m daily at night. Use Flonase (fluticasone) nasal spray 1 spray per nostril twice a day as needed for nasal congestion.  Use azelastine nasal spray 1-2 sprays per nostril twice a day as needed for runny nose/drainage.  Use over the counter antihistamines such as Zyrtec (cetirizine), Claritin (loratadine), Allegra (fexofenadine), or Xyzal (levocetirizine) daily as needed. May switch antihistamines every few months. Nasal saline spray (i.e., Simply Saline) or nasal saline lavage (i.e., NeilMed) is recommended as needed and prior to medicated nasal sprays.  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  Reflux Continue dietary and lifestyle modifications. Continue omeprazole 40 mg once a day to control reflux.  Food allergy Continue to avoid peanuts, milk, and soy.   For mild symptoms you can take over the counter antihistamines such as Benadryl and monitor symptoms closely. If symptoms worsen or if you have severe symptoms including breathing issues, throat closure, significant swelling, whole body hives, severe diarrhea and vomiting, lightheadedness then seek immediate medical care.  Follow up in 4 months or sooner if needed.

## 2023-02-06 NOTE — Progress Notes (Signed)
Follow Up Note  RE: Kimberly Lam MRN: [id number] DOB: July 29, 1946 Date of Office Visit: 02/06/2023  Referring provider: [name], NP Primary care provider: [name], NP  Chief Complaint: Follow-up (Pt states she has being doing ok but still having nasal stuffyness)  History of Present Illness: I had the pleasure of seeing Kimberly Lam for a follow up visit at the [facility] on 02/06/2023. She is a 77 y.o. female, who is being followed for asthma, allergic rhinoconjunctivitis on AIT, nasal septal perforation, food allergy, GERD. Her previous allergy office visit was on 09/14/2022 with Dr. Maudie Mercury. Today is a regular follow up visit.  Moderate persistent asthma Noticed some chest tightness after walking for 5-10 minutes and resolves after 3-5 minutes.  She hasn't been exercising/walking much and just started again.   Takes Singulair 37m daily.  Perennial and seasonal allergic rhinoconjunctivitis Takes Singulair 1110mdaily and on AIT with no issues. Patient tried to stop and it causes headaches if she goes more than 2 weeks.   Using Flonase 1 spray per nostril and azelastine 2 sprays per nostril BID. But patient still feeling stuffy.  Saw ENT and was recommended sinus surgery.   [name] was not covered.    Food allergy  No reactions. Avoiding peanuts, cow's milk, soybean. Can't afford epinephrine prescription - over $100   Gastroesophageal reflux disease Controlled with PPI.   09/22/2022 ENT visit: "Impression & Plans:  Kimberly Lam a [age].o. female with nasal septal perforation and turbinate hypertrophy.  - There remains a small anterior septal perforation. Her nasal obstruction is consistent with turbinate hypertrophy. I recommended proceeding with turbinate reduction and repair of the septal perforation. Risks, benefits, and alternatives were discussed and she expressed understanding. She requests a cost estimate before deciding on  surgery."  Assessment and Plan: Kimberly Lam a [age].o. female with: Moderate persistent asthma, uncomplicated Past history - [facility] could not afford to purchase after samples ran out. Reports that inhalers cause her mouth to get sores and become white. Interim history -  noted chest tightness lately with exertion. Albuterol was over $40.  Today's spirometry showed restriction.  Daily controller medication(s): Continue Singulair (montelukast) 10 mg daily at night. May use Airsupra rescue inhaler 2 puffs every 4 to 6 hours as needed for shortness of breath, chest tightness, coughing, and wheezing. Do not use more than 12 puffs in 24 hours. May use Airsupra rescue inhaler 2 puffs 5 to 15 minutes prior to strenuous physical activities. Monitor frequency of use. Rinse mouth after each use. Sample given.  Use this inhaler while we figure out how much albuterol is doing to cost you. May use albuterol rescue inhaler 2 puffs every 4 to 6 hours as needed for shortness of breath, chest tightness, coughing, and wheezing. May use albuterol rescue inhaler 2 puffs 5 to 15 minutes prior to strenuous physical activities. Monitor frequency of use.  Let me know if this is not covered - sent into mail order pharmacy.  Get spirometry at next visit.  Perennial and seasonal allergic rhinoconjunctivitis Past history - started AIT on 10/27/2015 (TREE/GRASS-WEED-MITE-CAT-DOG/MOLD-CR) every 2 weeks now unable to go to every 3-4 weeks.  Interim history - still having sinus issues. Saw ENT recommended surgery. [name] not covered. Continue environmental control measures. Continue allergy injections every 2 weeks - given today.  Continue Singulair (montelukast) 1033maily at night. Use Flonase (fluticasone) nasal spray 1 spray per nostril twice a  day as needed for nasal congestion.  Use azelastine nasal spray 1-2 sprays per nostril twice a day as needed for runny nose/drainage. Use over the counter  antihistamines such as Zyrtec (cetirizine), Claritin (loratadine), Allegra (fexofenadine), or Xyzal (levocetirizine) daily as needed. May switch antihistamines every few months. Nasal saline spray (i.e., Simply Saline) or nasal saline lavage (i.e., NeilMed) is recommended as needed and prior to medicated nasal sprays.  Nasal septal perforation Past history - Saw ENT in the past (no intervention). Interim history - unchanged. Follow up with ENT regarding the sinus surgery.  Anaphylactic reaction due to food, subsequent encounter Past history - soy caused increased rhinitis symptoms. Interim history - No reactions. Can't afford Epipen.  Continue to avoid peanuts, milk, and soy.   For mild symptoms you can take over the counter antihistamines such as Benadryl and monitor symptoms closely. If symptoms worsen or if you have severe symptoms including breathing issues, throat closure, significant swelling, whole body hives, severe diarrhea and vomiting, lightheadedness then seek immediate medical care.  Gastroesophageal reflux disease Stable. Continue dietary and lifestyle modifications. Continue omeprazole 40 mg once a day to control reflux.  Return in about 4 months (around 06/07/2023).  Meds ordered this encounter  Medications   albuterol (VENTOLIN HFA) 108 (90 Base) MCG/ACT inhaler    Sig: Inhale 2 puffs into the lungs every 4 (four) hours as needed for wheezing or shortness of breath (coughing fits).    Dispense:  18 g    Refill:  1   Lab Orders  No laboratory test(s) ordered today    Diagnostics: Spirometry:  Tracings reviewed. Her effort: Good reproducible efforts. FVC: 1.04L FEV1: 0.95L, 55% predicted FEV1/FVC ratio: 91% Interpretation: Spirometry consistent with possible restrictive disease.  Please see scanned spirometry results for details.  Medication List:  Current Outpatient Medications  Medication Sig Dispense Refill   Accu-Chek FastClix Lancets MISC USE TO CHECK  BLOOD GLUCOSE TWICE DAILY 204 each 3   ACCU-CHEK GUIDE test strip USE AS INSTRUCTED 300 strip 3   albuterol (VENTOLIN HFA) 108 (90 Base) MCG/ACT inhaler Inhale 2 puffs into the lungs every 4 (four) hours as needed for wheezing or shortness of breath (coughing fits). 18 g 1   Alcohol Swabs (DROPSAFE ALCOHOL PREP) 70 % PADS USE TO TEST BLOOD GLUCOSE FOUR TIMES DAILY AS DIRECTED 400 each 10   Ascorbic Acid (VITAMIN C) 1000 MG tablet Take 1,000 mg by mouth daily.     azelastine (ASTELIN) 0.1 % nasal spray USE 2 SPRAYS INTO BOTH NOSTRILS 2 (TWO) TIMES DAILY.(MUST KEEP APPOINTMENT FOR FURTHER REFILLS) 60 mL 3   Azelastine-Fluticasone 137-50 MCG/ACT SUSP Place 1 spray into the nose 2 (two) times daily as needed (nasal symptoms). 69 g 1   Barberry-Oreg Grape-Goldenseal (BERBERINE COMPLEX PO) Take 1 capsule by mouth daily.     Blood Glucose Monitoring Suppl (ACCU-CHEK GUIDE) w/Device KIT 1 kit by Does not apply route as directed. 1 kit 0   CINNAMON PO Take 1 tablet by mouth as needed (for blood glucose).     Cranberry 450 MG CAPS Take 450 mg by mouth daily.     Cyanocobalamin (VITAMIN B-12 CR) 1500 MCG TBCR Take 1,500 mcg by mouth daily.     Echinacea-Goldenseal (ECHINACEA COMB/GOLDEN SEAL PO) Take 1 tablet by mouth as needed (for prevention). Reported on 12/16/2015     EPINEPHrine 0.3 mg/0.3 mL IJ SOAJ injection Inject 0.3 mg into the muscle as needed for anaphylaxis. 3 each 1   fluticasone (FLONASE)  50 MCG/ACT nasal spray Place 2 sprays into both nostrils daily. 48 g 1   gabapentin (NEURONTIN) 300 MG capsule TAKE 2 CAPSULES BY MOUTH 3  TIMES DAILY (Patient taking differently: Take 600 mg by mouth 3 (three) times daily as needed (for leg cramps).) 540 capsule 2   glipiZIDE (GLUCOTROL XL) 5 MG 24 hr tablet TAKE 1 TABLET EVERY DAY WITH BREAKFAST 90 tablet 3   Green Tea, Camillia sinensis, (GREEN TEA PO) Take 1 tablet by mouth as needed (for wellbeing). Reported on 12/16/2015     Lactobacillus (ACIDOPHILUS  PROBIOTIC PO) Take 1 tablet by mouth daily.     levocetirizine (XYZAL) 5 MG tablet TAKE 1 TABLET EVERY EVENING 90 tablet 1   Melatonin ER 5 MG TBCR Take 5 mg by mouth at bedtime as needed (FOR SLEEP).     metFORMIN (GLUCOPHAGE-XR) 500 MG 24 hr tablet TAKE 2 TABLETS TWICE DAILY 360 tablet 3   montelukast (SINGULAIR) 10 MG tablet Take 1 tablet (10 mg total) by mouth at bedtime. 90 tablet 3   Omega-3 Fatty Acids (OMEGA 3 PO) Take 1 tablet by mouth as needed.     omeprazole (PRILOSEC) 40 MG capsule TAKE 1 CAPSULE EVERY DAY 90 capsule 3   Polyethyl Glycol-Propyl Glycol (SYSTANE FREE OP) Apply 1 drop to eye daily. Uses thera work for dr eyes per pt     Potassium 95 MG TABS Take 95 mg by mouth daily.     ramipril (ALTACE) 2.5 MG capsule Take 1 capsule (2.5 mg total) by mouth daily. 90 capsule 3   simvastatin (ZOCOR) 20 MG tablet TAKE 1 TABLET EVERY DAY 90 tablet 10   Stinging Nettle 2 % POWD Take 1 tablet by mouth daily at 12 noon. Reported on 12/16/2015     traZODone (DESYREL) 50 MG tablet TAKE 1/2 TO 1 TABLET AT BEDTIME AS NEEDED FOR SLEEP 90 tablet 3   TURMERIC PO Take 800 mg by mouth daily as needed (for well being). Reported on 12/16/2015     alendronate (FOSAMAX) 70 MG tablet TAKE 1 TABLET WEEKLY WITH 8 OZ OF PLAIN WATER 30 MINUTES BEFORE FIRST FOOD, DRINK OR MEDS. STAY UPRIGHT FOR 30 MINS (Patient not taking: Reported on 02/06/2023) 12 tablet 10   sucralfate (CARAFATE) 1 GM/10ML suspension SHAKE LIQUID AND TAKE 10 ML(1 GRAM) BY MOUTH FOUR TIMES DAILY WITH MEALS AND AT BEDTIME (Patient not taking: Reported on 02/06/2023) 420 mL 0   No current facility-administered medications for this visit.   Allergies: Allergies  Allergen Reactions   Peanut-Containing Drug Products Anaphylaxis   Eggs Or Egg-Derived Products Nausea Only    Nausea only- can eat cakes with eggs    Milk-Related Compounds Nausea And Vomiting    Milk and cheese *Can drink 1 cup a day*   Actos [Pioglitazone] Nausea Only   Soy  Allergy Nausea Only    Pt states had allergy testing and was told allergic to soy-causes nausea but no other reactions   Vicodin [Hydrocodone-Acetaminophen] Nausea Only   I reviewed her past medical history, social history, family history, and environmental history and no significant changes have been reported from her previous visit.  Review of Systems  Constitutional:  Negative for appetite change, chills, fever and unexpected weight change.  HENT:  Positive for congestion. Negative for rhinorrhea.   Eyes:  Negative for itching.  Respiratory:  Positive for chest tightness. Negative for cough, shortness of breath and wheezing.   Gastrointestinal:  Negative for abdominal pain.  Skin:  Negative for rash.  Allergic/Immunologic: Positive for environmental allergies and food allergies.  Neurological:  Negative for headaches.    Objective: BP 124/64   Pulse 90   Temp (!) 97.3 F (36.3 C)   Resp 20   Ht 5' 4"$  (1.626 m)   Wt 138 lb 8 oz (62.8 kg)   SpO2 95%   BMI 23.77 kg/m  Body mass index is 23.77 kg/m. Physical Exam Vitals and nursing note reviewed.  Constitutional:      Appearance: Normal appearance. She is well-developed.  HENT:     Head: Normocephalic and atraumatic.     Right Ear: Tympanic membrane and external ear normal.     Left Ear: Tympanic membrane and external ear normal.     Nose:     Comments: Perforated nasal septum noted.    Mouth/Throat:     Mouth: Mucous membranes are moist.     Pharynx: Oropharynx is clear.  Eyes:     Conjunctiva/sclera: Conjunctivae normal.  Cardiovascular:     Rate and Rhythm: Normal rate and regular rhythm.     Heart sounds: Normal heart sounds. No murmur heard. Pulmonary:     Effort: Pulmonary effort is normal.     Breath sounds: Normal breath sounds. No wheezing, rhonchi or rales.  Musculoskeletal:     Cervical back: Neck supple.  Skin:    General: Skin is warm.     Findings: No rash.  Neurological:     Mental Status: She is  alert and oriented to person, place, and time.  Psychiatric:        Behavior: Behavior normal.    Previous notes and tests were reviewed. The plan was reviewed with the patient/family, and all questions/concerned were addressed.  It was my pleasure to see Kimberly Lam today and participate in her care. Please feel free to contact me with any questions or concerns.  Sincerely,  Rexene Alberts, DO Allergy & Immunology  [facility] office: [place] office: [phone]

## 2023-02-13 ENCOUNTER — Ambulatory Visit: Payer: Medicare HMO | Admitting: Podiatry

## 2023-02-16 ENCOUNTER — Ambulatory Visit (INDEPENDENT_AMBULATORY_CARE_PROVIDER_SITE_OTHER): Payer: Medicare HMO

## 2023-02-16 DIAGNOSIS — J309 Allergic rhinitis, unspecified: Secondary | ICD-10-CM | POA: Diagnosis not present

## 2023-03-07 ENCOUNTER — Ambulatory Visit (INDEPENDENT_AMBULATORY_CARE_PROVIDER_SITE_OTHER): Payer: Medicare HMO

## 2023-03-07 DIAGNOSIS — J309 Allergic rhinitis, unspecified: Secondary | ICD-10-CM | POA: Diagnosis not present

## 2023-03-09 DIAGNOSIS — J3489 Other specified disorders of nose and nasal sinuses: Secondary | ICD-10-CM | POA: Diagnosis not present

## 2023-03-09 DIAGNOSIS — J343 Hypertrophy of nasal turbinates: Secondary | ICD-10-CM | POA: Diagnosis not present

## 2023-03-20 ENCOUNTER — Other Ambulatory Visit: Payer: Self-pay | Admitting: Allergy

## 2023-03-21 ENCOUNTER — Ambulatory Visit (INDEPENDENT_AMBULATORY_CARE_PROVIDER_SITE_OTHER): Payer: Medicare HMO

## 2023-03-21 DIAGNOSIS — J309 Allergic rhinitis, unspecified: Secondary | ICD-10-CM | POA: Diagnosis not present

## 2023-03-23 ENCOUNTER — Encounter: Payer: Self-pay | Admitting: Allergy

## 2023-03-23 ENCOUNTER — Other Ambulatory Visit: Payer: Self-pay

## 2023-03-23 MED ORDER — AIRSUPRA 90-80 MCG/ACT IN AERO: 2.0000 | INHALATION_SPRAY | RESPIRATORY_TRACT | 5 refills | Status: DC | PRN

## 2023-04-01 ENCOUNTER — Other Ambulatory Visit: Payer: Self-pay | Admitting: Allergy

## 2023-04-05 ENCOUNTER — Ambulatory Visit (INDEPENDENT_AMBULATORY_CARE_PROVIDER_SITE_OTHER): Payer: Medicare HMO

## 2023-04-05 DIAGNOSIS — J309 Allergic rhinitis, unspecified: Secondary | ICD-10-CM | POA: Diagnosis not present

## 2023-04-18 ENCOUNTER — Ambulatory Visit (INDEPENDENT_AMBULATORY_CARE_PROVIDER_SITE_OTHER): Payer: Medicare HMO

## 2023-04-18 DIAGNOSIS — J309 Allergic rhinitis, unspecified: Secondary | ICD-10-CM | POA: Diagnosis not present

## 2023-04-19 DIAGNOSIS — J3089 Other allergic rhinitis: Secondary | ICD-10-CM | POA: Diagnosis not present

## 2023-04-19 NOTE — Progress Notes (Signed)
EXP 04/18/24 

## 2023-04-20 DIAGNOSIS — J301 Allergic rhinitis due to pollen: Secondary | ICD-10-CM | POA: Diagnosis not present

## 2023-04-21 DIAGNOSIS — J302 Other seasonal allergic rhinitis: Secondary | ICD-10-CM | POA: Diagnosis not present

## 2023-05-09 ENCOUNTER — Ambulatory Visit (INDEPENDENT_AMBULATORY_CARE_PROVIDER_SITE_OTHER): Payer: Medicare HMO

## 2023-05-09 DIAGNOSIS — J309 Allergic rhinitis, unspecified: Secondary | ICD-10-CM

## 2023-05-15 ENCOUNTER — Encounter: Payer: Self-pay | Admitting: Adult Health

## 2023-05-17 ENCOUNTER — Encounter: Payer: Self-pay | Admitting: Internal Medicine

## 2023-05-17 ENCOUNTER — Ambulatory Visit (INDEPENDENT_AMBULATORY_CARE_PROVIDER_SITE_OTHER): Payer: Medicare HMO | Admitting: Internal Medicine

## 2023-05-17 ENCOUNTER — Ambulatory Visit (INDEPENDENT_AMBULATORY_CARE_PROVIDER_SITE_OTHER): Payer: Medicare HMO | Admitting: *Deleted

## 2023-05-17 VITALS — BP 120/70 | HR 76 | Temp 99.1°F | Wt 138.5 lb

## 2023-05-17 DIAGNOSIS — R059 Cough, unspecified: Secondary | ICD-10-CM | POA: Diagnosis not present

## 2023-05-17 DIAGNOSIS — J309 Allergic rhinitis, unspecified: Secondary | ICD-10-CM

## 2023-05-17 DIAGNOSIS — J069 Acute upper respiratory infection, unspecified: Secondary | ICD-10-CM | POA: Diagnosis not present

## 2023-05-17 DIAGNOSIS — J029 Acute pharyngitis, unspecified: Secondary | ICD-10-CM

## 2023-05-17 LAB — POCT RAPID STREP A (OFFICE): Rapid Strep A Screen: NEGATIVE

## 2023-05-17 LAB — POC COVID19 BINAXNOW: SARS Coronavirus 2 Ag: NEGATIVE

## 2023-05-17 NOTE — Progress Notes (Signed)
Established Patient Office Visit     CC/Reason for Visit: URI symptoms  HPI: Kimberly Lam is a 77 y.o. female who is coming in today for the above mentioned reasons.  For the past 2 days has been experiencing sneezing, runny nose, cough, sore throat.  Cough is nonproductive.  No fever, no sick contacts or recent travel.   Past Medical/Surgical History: Past Medical History:  Diagnosis Date   Acid reflux    Allergic rhinitis    Allergy    Anemia    Arthritis    per MD- pt states no s/s of this    Asthma    Bilateral sciatica    Cataract    removed both eyes    Chronic bilateral low back pain    Colon polyps    Diverticulosis    DM type 2 (diabetes mellitus, type 2) (HCC)    Environmental allergies    flowers, pollen, trees   GERD (gastroesophageal reflux disease)    Hyperlipidemia    Low back pain    Migraine    past hx- control as allergies are controlled    Osteoporosis    PONV (postoperative nausea and vomiting)     Past Surgical History:  Procedure Laterality Date   CATARACT EXTRACTION, BILATERAL Bilateral 2012   CESAREAN SECTION     x 2   COLONOSCOPY     > 10 yrs ago- unsure if removed polyps as was told sev.different things after this colon    DENTAL SURGERY     molars removed    DILATION AND CURETTAGE OF UTERUS     NASAL SINUS SURGERY     TUBAL LIGATION      Social History:  reports that she has never smoked. She has never used smokeless tobacco. She reports that she does not drink alcohol and does not use drugs.  Allergies: Allergies  Allergen Reactions   Peanut-Containing Drug Products Anaphylaxis   Egg-Derived Products Nausea Only    Nausea only- can eat cakes with eggs    Milk-Related Compounds Nausea And Vomiting    Milk and cheese *Can drink 1 cup a day*   Actos [Pioglitazone] Nausea Only   Soy Allergy Nausea Only    Pt states had allergy testing and was told allergic to soy-causes nausea but no other reactions   Vicodin  [Hydrocodone-Acetaminophen] Nausea Only    Family History:  Family History  Problem Relation Age of Onset   Asthma Father    Emphysema Father    Alcohol abuse Father    Diabetes Mellitus II Brother        x 2   Hyperlipidemia Brother    Hypertension Brother    Pancreatic cancer Brother    Prostate cancer Brother    Diabetes Brother    Heart disease Maternal Grandmother        hardening of the arteries   Heart murmur Daughter    Glaucoma Brother    Breast cancer Neg Hx    Colon cancer Neg Hx    Colon polyps Neg Hx    Esophageal cancer Neg Hx    Rectal cancer Neg Hx    Stomach cancer Neg Hx      Current Outpatient Medications:    Accu-Chek FastClix Lancets MISC, USE TO CHECK BLOOD GLUCOSE TWICE DAILY, Disp: 204 each, Rfl: 3   ACCU-CHEK GUIDE test strip, USE AS INSTRUCTED, Disp: 300 strip, Rfl: 3   albuterol (VENTOLIN HFA) 108 (90 Base) MCG/ACT  inhaler, Inhale 2 puffs into the lungs every 4 (four) hours as needed for wheezing or shortness of breath (coughing fits)., Disp: 18 g, Rfl: 1   Albuterol-Budesonide (AIRSUPRA) 90-80 MCG/ACT AERO, Inhale 2 puffs into the lungs as needed (for shortness of breath, chest tightness, coughing and wheezing. May use 2 puff 5-15 minutes prior to strenuous physical activites. Do no use more than 12 puffs in 24 hours. Rinse mouth after each use.)., Disp: 10.7 g, Rfl: 5   Alcohol Swabs (DROPSAFE ALCOHOL PREP) 70 % PADS, USE TO TEST BLOOD GLUCOSE FOUR TIMES DAILY AS DIRECTED, Disp: 400 each, Rfl: 10   alendronate (FOSAMAX) 70 MG tablet, TAKE 1 TABLET WEEKLY WITH 8 OZ OF PLAIN WATER 30 MINUTES BEFORE FIRST FOOD, DRINK OR MEDS. STAY UPRIGHT FOR 30 MINS, Disp: 12 tablet, Rfl: 10   Ascorbic Acid (VITAMIN C) 1000 MG tablet, Take 1,000 mg by mouth daily., Disp: , Rfl:    azelastine (ASTELIN) 0.1 % nasal spray, USE 2 SPRAYS IN EACH NOSTRIL TWICE DAILY (NEED MD APPOINTMENT), Disp: 120 mL, Rfl: 2   Azelastine-Fluticasone 137-50 MCG/ACT SUSP, Place 1 spray into  the nose 2 (two) times daily as needed (nasal symptoms)., Disp: 69 g, Rfl: 1   Barberry-Oreg Grape-Goldenseal (BERBERINE COMPLEX PO), Take 1 capsule by mouth daily., Disp: , Rfl:    Blood Glucose Monitoring Suppl (ACCU-CHEK GUIDE) w/Device KIT, 1 kit by Does not apply route as directed., Disp: 1 kit, Rfl: 0   CINNAMON PO, Take 1 tablet by mouth as needed (for blood glucose)., Disp: , Rfl:    Cranberry 450 MG CAPS, Take 450 mg by mouth daily., Disp: , Rfl:    Cyanocobalamin (VITAMIN B-12 CR) 1500 MCG TBCR, Take 1,500 mcg by mouth daily., Disp: , Rfl:    Echinacea-Goldenseal (ECHINACEA COMB/GOLDEN SEAL PO), Take 1 tablet by mouth as needed (for prevention). Reported on 12/16/2015, Disp: , Rfl:    EPINEPHrine 0.3 mg/0.3 mL IJ SOAJ injection, Inject 0.3 mg into the muscle as needed for anaphylaxis., Disp: 3 each, Rfl: 1   fluticasone (FLONASE) 50 MCG/ACT nasal spray, USE 2 SPRAYS IN EACH NOSTRIL EVERY DAY, Disp: 48 g, Rfl: 0   gabapentin (NEURONTIN) 300 MG capsule, TAKE 2 CAPSULES BY MOUTH 3  TIMES DAILY (Patient taking differently: Take 600 mg by mouth 3 (three) times daily as needed (for leg cramps).), Disp: 540 capsule, Rfl: 2   glipiZIDE (GLUCOTROL XL) 5 MG 24 hr tablet, TAKE 1 TABLET EVERY DAY WITH BREAKFAST, Disp: 90 tablet, Rfl: 3   Green Tea, Camillia sinensis, (GREEN TEA PO), Take 1 tablet by mouth as needed (for wellbeing). Reported on 12/16/2015, Disp: , Rfl:    Lactobacillus (ACIDOPHILUS PROBIOTIC PO), Take 1 tablet by mouth daily., Disp: , Rfl:    levocetirizine (XYZAL) 5 MG tablet, TAKE 1 TABLET EVERY EVENING, Disp: 90 tablet, Rfl: 1   Melatonin ER 5 MG TBCR, Take 5 mg by mouth at bedtime as needed (FOR SLEEP)., Disp: , Rfl:    metFORMIN (GLUCOPHAGE-XR) 500 MG 24 hr tablet, TAKE 2 TABLETS TWICE DAILY, Disp: 360 tablet, Rfl: 3   montelukast (SINGULAIR) 10 MG tablet, Take 1 tablet (10 mg total) by mouth at bedtime., Disp: 90 tablet, Rfl: 3   Omega-3 Fatty Acids (OMEGA 3 PO), Take 1 tablet by  mouth as needed., Disp: , Rfl:    omeprazole (PRILOSEC) 40 MG capsule, TAKE 1 CAPSULE EVERY DAY, Disp: 90 capsule, Rfl: 3   Polyethyl Glycol-Propyl Glycol (SYSTANE FREE OP), Apply 1  drop to eye daily. Uses thera work for dr eyes per pt, Disp: , Rfl:    Potassium 95 MG TABS, Take 95 mg by mouth daily., Disp: , Rfl:    ramipril (ALTACE) 2.5 MG capsule, Take 1 capsule (2.5 mg total) by mouth daily., Disp: 90 capsule, Rfl: 3   simvastatin (ZOCOR) 20 MG tablet, TAKE 1 TABLET EVERY DAY, Disp: 90 tablet, Rfl: 10   Stinging Nettle 2 % POWD, Take 1 tablet by mouth daily at 12 noon. Reported on 12/16/2015, Disp: , Rfl:    traZODone (DESYREL) 50 MG tablet, TAKE 1/2 TO 1 TABLET AT BEDTIME AS NEEDED FOR SLEEP, Disp: 90 tablet, Rfl: 3   TURMERIC PO, Take 800 mg by mouth daily as needed (for well being). Reported on 12/16/2015, Disp: , Rfl:   Review of Systems:  Negative unless indicated in HPI.   Physical Exam: Vitals:   05/17/23 1353  BP: 120/70  Pulse: 76  Temp: 99.1 F (37.3 C)  TempSrc: Oral  SpO2: 98%  Weight: 138 lb 8 oz (62.8 kg)    Body mass index is 23.77 kg/m.   Physical Exam Vitals reviewed.  Constitutional:      Appearance: Normal appearance.  HENT:     Right Ear: Tympanic membrane, ear canal and external ear normal.     Left Ear: Tympanic membrane, ear canal and external ear normal.     Mouth/Throat:     Mouth: Mucous membranes are moist.     Pharynx: Posterior oropharyngeal erythema present.  Eyes:     Conjunctiva/sclera: Conjunctivae normal.     Pupils: Pupils are equal, round, and reactive to light.  Cardiovascular:     Rate and Rhythm: Normal rate and regular rhythm.  Pulmonary:     Effort: Pulmonary effort is normal.     Breath sounds: Normal breath sounds.  Neurological:     Mental Status: She is alert.      Impression and Plan:  Sore throat -     POC COVID-19 BinaxNow -     POCT rapid strep A  Cough, unspecified type -     POC COVID-19  BinaxNow  Viral URI   -In office rapid strep and COVID tests are negative. -Given exam findings, PNA, pharyngitis, ear infection are not likely, hence abx have not been prescribed. -Have advised rest, fluids, OTC antihistamines, cough suppressants and mucinex. -RTC if no improvement in 10-14 days.   Time spent:22 minutes reviewing chart, interviewing and examining patient and formulating plan of care.     Chaya Jan, MD [facility]

## 2023-05-24 ENCOUNTER — Ambulatory Visit (INDEPENDENT_AMBULATORY_CARE_PROVIDER_SITE_OTHER): Payer: Medicare HMO | Admitting: *Deleted

## 2023-05-24 DIAGNOSIS — J309 Allergic rhinitis, unspecified: Secondary | ICD-10-CM

## 2023-05-29 ENCOUNTER — Other Ambulatory Visit: Payer: Self-pay | Admitting: Allergy

## 2023-05-29 ENCOUNTER — Other Ambulatory Visit: Payer: Self-pay | Admitting: Adult Health

## 2023-05-29 DIAGNOSIS — E119 Type 2 diabetes mellitus without complications: Secondary | ICD-10-CM

## 2023-05-30 ENCOUNTER — Ambulatory Visit (INDEPENDENT_AMBULATORY_CARE_PROVIDER_SITE_OTHER): Payer: Medicare HMO

## 2023-05-30 DIAGNOSIS — J309 Allergic rhinitis, unspecified: Secondary | ICD-10-CM | POA: Diagnosis not present

## 2023-05-31 ENCOUNTER — Ambulatory Visit (INDEPENDENT_AMBULATORY_CARE_PROVIDER_SITE_OTHER): Payer: Medicare HMO | Admitting: Podiatry

## 2023-05-31 ENCOUNTER — Encounter: Payer: Self-pay | Admitting: Podiatry

## 2023-05-31 VITALS — BP 140/65 | HR 65

## 2023-05-31 DIAGNOSIS — L84 Corns and callosities: Secondary | ICD-10-CM | POA: Diagnosis not present

## 2023-05-31 DIAGNOSIS — M2041 Other hammer toe(s) (acquired), right foot: Secondary | ICD-10-CM | POA: Diagnosis not present

## 2023-05-31 DIAGNOSIS — G6289 Other specified polyneuropathies: Secondary | ICD-10-CM

## 2023-05-31 DIAGNOSIS — M79674 Pain in right toe(s): Secondary | ICD-10-CM | POA: Diagnosis not present

## 2023-05-31 DIAGNOSIS — B351 Tinea unguium: Secondary | ICD-10-CM

## 2023-05-31 DIAGNOSIS — M79675 Pain in left toe(s): Secondary | ICD-10-CM | POA: Diagnosis not present

## 2023-05-31 DIAGNOSIS — M2042 Other hammer toe(s) (acquired), left foot: Secondary | ICD-10-CM | POA: Diagnosis not present

## 2023-05-31 DIAGNOSIS — E119 Type 2 diabetes mellitus without complications: Secondary | ICD-10-CM

## 2023-05-31 NOTE — Telephone Encounter (Signed)
Patient need to schedule an ov for more refills. 

## 2023-06-01 ENCOUNTER — Ambulatory Visit: Payer: Medicare HMO | Admitting: Neurology

## 2023-06-04 NOTE — Progress Notes (Signed)
ANNUAL DIABETIC FOOT EXAM  Subjective: Kimberly Lam presents today annual diabetic foot exam.  Chief Complaint  Patient presents with   Diabetes    [facility] BS - 149 A1C - 6.7 LVPCP - 12/2022   Patient confirms h/o diabetes.  Patient denies any h/o foot wounds.  Patient has been diagnosed with neuropathy.  Risk factors: diabetes, neuropathy, hyperlipidemia.  Shirline Frees, NP is patient's PCP.  Past Medical History:  Diagnosis Date   Acid reflux    Allergic rhinitis    Allergy    Anemia    Arthritis    per MD- pt states no s/s of this    Asthma    Bilateral sciatica    Cataract    removed both eyes    Chronic bilateral low back pain    Colon polyps    Diverticulosis    DM type 2 (diabetes mellitus, type 2) (HCC)    Environmental allergies    flowers, pollen, trees   GERD (gastroesophageal reflux disease)    Hyperlipidemia    Low back pain    Migraine    past hx- control as allergies are controlled    Osteoporosis    PONV (postoperative nausea and vomiting)    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   Right lumbar radiculopathy 01/23/2018   Gastroesophageal reflux disease 09/14/2017   Diabetes mellitus without complication (HCC) 05/17/2017   Anaphylactic reaction due to food, subsequent encounter 01/16/2017   Moderate persistent asthma, uncomplicated 12/16/2015   Perennial and seasonal allergic rhinoconjunctivitis 12/16/2015   Lumbar radiculopathy 11/25/2015   Past Surgical History:  Procedure Laterality Date   CATARACT EXTRACTION, BILATERAL Bilateral 2012   CESAREAN SECTION     x 2   COLONOSCOPY     > 10 yrs ago- unsure if removed polyps as was told sev.different things after this colon    DENTAL SURGERY     molars removed    DILATION AND CURETTAGE OF UTERUS     NASAL SINUS  SURGERY     TUBAL LIGATION     Current Outpatient Medications on File Prior to Visit  Medication Sig Dispense Refill   Accu-Chek FastClix Lancets MISC USE TO CHECK BLOOD GLUCOSE TWICE DAILY 204 each 3   ACCU-CHEK GUIDE test strip USE AS INSTRUCTED 300 strip 3   albuterol (VENTOLIN HFA) 108 (90 Base) MCG/ACT inhaler Inhale 2 puffs into the lungs every 4 (four) hours as needed for wheezing or shortness of breath (coughing fits). 18 g 1   Albuterol-Budesonide (AIRSUPRA) 90-80 MCG/ACT AERO Inhale 2 puffs into the lungs as needed (for shortness of breath, chest tightness, coughing and wheezing. May use 2 puff 5-15 minutes prior to strenuous physical activites. Do no use more than 12 puffs in 24 hours. Rinse mouth after each use.). 10.7 g 5   Alcohol Swabs (DROPSAFE ALCOHOL PREP) 70 % PADS USE TO TEST BLOOD GLUCOSE FOUR TIMES DAILY AS DIRECTED 400 each 10   alendronate (FOSAMAX) 70 MG tablet TAKE 1 TABLET WEEKLY WITH 8 OZ OF PLAIN WATER 30 MINUTES BEFORE FIRST FOOD, DRINK OR MEDS. STAY UPRIGHT FOR 30 MINS 12 tablet 10   Ascorbic Acid (VITAMIN C) 1000 MG tablet Take 1,000 mg by mouth daily.     azelastine (ASTELIN) 0.1 % nasal spray USE 2 SPRAYS IN [facility]  NOSTRIL TWICE DAILY (NEED MD APPOINTMENT) 120 mL 2   Azelastine-Fluticasone 137-50 MCG/ACT SUSP Place 1 spray into the nose 2 (two) times daily as needed (nasal symptoms). 69 g 1   Barberry-Oreg Grape-Goldenseal (BERBERINE COMPLEX PO) Take 1 capsule by mouth daily.     Blood Glucose Monitoring Suppl (ACCU-CHEK GUIDE) w/Device KIT 1 kit by Does not apply route as directed. 1 kit 0   CINNAMON PO Take 1 tablet by mouth as needed (for blood glucose).     Cranberry 450 MG CAPS Take 450 mg by mouth daily.     Cyanocobalamin (VITAMIN B-12 CR) 1500 MCG TBCR Take 1,500 mcg by mouth daily.     Echinacea-Goldenseal (ECHINACEA COMB/GOLDEN SEAL PO) Take 1 tablet by mouth as needed (for prevention). Reported on 12/16/2015     EPINEPHrine 0.3 mg/0.3 mL IJ SOAJ  injection Inject 0.3 mg into the muscle as needed for anaphylaxis. 3 each 1   fluticasone (FLONASE) 50 MCG/ACT nasal spray USE 2 SPRAYS IN EACH NOSTRIL EVERY DAY 48 g 0   gabapentin (NEURONTIN) 300 MG capsule TAKE 2 CAPSULES BY MOUTH 3  TIMES DAILY (Patient taking differently: Take 600 mg by mouth 3 (three) times daily as needed (for leg cramps).) 540 capsule 2   glipiZIDE (GLUCOTROL XL) 5 MG 24 hr tablet TAKE 1 TABLET EVERY DAY WITH BREAKFAST 90 tablet 3   Green Tea, Camillia sinensis, (GREEN TEA PO) Take 1 tablet by mouth as needed (for wellbeing). Reported on 12/16/2015     Lactobacillus (ACIDOPHILUS PROBIOTIC PO) Take 1 tablet by mouth daily.     levocetirizine (XYZAL) 5 MG tablet TAKE 1 TABLET EVERY EVENING 30 tablet 0   Melatonin ER 5 MG TBCR Take 5 mg by mouth at bedtime as needed (FOR SLEEP).     metFORMIN (GLUCOPHAGE-XR) 500 MG 24 hr tablet TAKE 2 TABLETS TWICE DAILY 360 tablet 3   montelukast (SINGULAIR) 10 MG tablet Take 1 tablet (10 mg total) by mouth at bedtime. 90 tablet 3   Omega-3 Fatty Acids (OMEGA 3 PO) Take 1 tablet by mouth as needed.     omeprazole (PRILOSEC) 40 MG capsule TAKE 1 CAPSULE EVERY DAY 90 capsule 3   Polyethyl Glycol-Propyl Glycol (SYSTANE FREE OP) Apply 1 drop to eye daily. Uses thera work for dr eyes per pt     Potassium 95 MG TABS Take 95 mg by mouth daily.     ramipril (ALTACE) 2.5 MG capsule Take 1 capsule (2.5 mg total) by mouth daily. 90 capsule 3   simvastatin (ZOCOR) 20 MG tablet TAKE 1 TABLET EVERY DAY 90 tablet 10   Stinging Nettle 2 % POWD Take 1 tablet by mouth daily at 12 noon. Reported on 12/16/2015     traZODone (DESYREL) 50 MG tablet TAKE 1/2 TO 1 TABLET AT BEDTIME AS NEEDED FOR SLEEP 90 tablet 3   TURMERIC PO Take 800 mg by mouth daily as needed (for well being). Reported on 12/16/2015     No current facility-administered medications on file prior to visit.    Allergies  Allergen Reactions   Peanut-Containing Drug Products Anaphylaxis    Egg-Derived Products Nausea Only    Nausea only- can eat cakes with eggs    Milk-Related Compounds Nausea And Vomiting    Milk and cheese *Can drink 1 cup a day*   Actos [Pioglitazone] Nausea Only   Soy Allergy Nausea Only    Pt states had allergy testing and was told allergic to soy-causes nausea but no  other reactions   Vicodin [Hydrocodone-Acetaminophen] Nausea Only   Social History   Occupational History   Occupation: Retired  Tobacco Use   Smoking status: Never   Smokeless tobacco: Never  Vaping Use   Vaping Use: Never used  Substance and Sexual Activity   Alcohol use: No    Alcohol/week: 0.0 standard drinks of alcohol   Drug use: No   Sexual activity: Not on file   Family History  Problem Relation Age of Onset   Asthma Father    Emphysema Father    Alcohol abuse Father    Diabetes Mellitus II Brother        x 2   Hyperlipidemia Brother    Hypertension Brother    Pancreatic cancer Brother    Prostate cancer Brother    Diabetes Brother    Heart disease Maternal Grandmother        hardening of the arteries   Heart murmur Daughter    Glaucoma Brother    Breast cancer Neg Hx    Colon cancer Neg Hx    Colon polyps Neg Hx    Esophageal cancer Neg Hx    Rectal cancer Neg Hx    Stomach cancer Neg Hx    Immunization History  Administered Date(s) Administered   PFIZER Comirnaty(Gray Top)Covid-19 Tri-Sucrose Vaccine 05/15/2021   PFIZER(Purple Top)SARS-COV-2 Vaccination 04/30/2020, 05/25/2020, 12/15/2020   PPD Test 05/13/2009, 05/27/2009, 05/06/2011   Pneumococcal Conjugate-13 01/27/2014     Review of Systems: Negative except as noted in the HPI.   Objective: Vitals:   05/31/23 1335  BP: (!) 140/65  Pulse: 65  SpO2: 97%    Genysis Ballas is a pleasant 77 y.o. female in NAD. AAO X 3.  Vascular Examination: Capillary refill time immediate b/l. Vascular status intact b/l with palpable pedal pulses. Pedal hair present b/l. No pain with calf compression  b/l. Skin temperature gradient WNL b/l. No cyanosis or clubbing b/l. No ischemia or gangrene noted b/l.   Neurological Examination: Sensation grossly intact b/l with 10 gram monofilament. Vibratory sensation intact b/l. Pt has subjective symptoms of neuropathy.   Dermatological Examination: Pedal skin with normal turgor, texture and tone b/l.  No open wounds. No interdigital macerations.   Toenails 1-5 b/l thick, discolored, elongated with subungual debris and pain on dorsal palpation.   Hyperkeratotic lesion(s) 1st metatarsal head b/l lower extremities.  No erythema, no edema, no drainage, no fluctuance.  Musculoskeletal Examination: Normal muscle strength 5/5 to all lower extremity muscle groups bilaterally. Hammertoe deformity noted 2-5 b/l.Marland Kitchen No pain, crepitus or joint limitation noted with ROM b/l LE.  Patient ambulates independently without assistive aids.  Radiographs: None  Lab Results  Component Value Date   HGBA1C 6.9 05/18/2022   ADA Risk Categorization: Low Risk :  Patient has all of the following: Intact protective sensation No prior foot ulcer  No severe deformity Pedal pulses present  Assessment: 1. Pain due to onychomycosis of toenails of both feet   2. Callus   3. Hammertoe, bilateral   4. Encounter for diabetic foot exam (HCC)   5. Other polyneuropathy   6. Diabetes mellitus without complication (HCC)     Plan: Orders Placed This Encounter  Procedures   For Home Use Only DME Diabetic Shoe    Dispense one pair extra depth shoes and 3 pair total contact insoles.  -Patient was evaluated and treated. All patient's and/or POA's questions/concerns answered on today's visit. -Diabetic foot examination performed today. -Continue diabetic foot care principles:  inspect feet daily, monitor glucose as recommended by PCP and/or Endocrinologist, and follow prescribed diet per PCP, Endocrinologist and/or dietician. -Patient to continue soft, supportive shoe gear  daily. -Discussed diabetic shoe benefit available based on patient's diagnoses. Patient/POA would like to proceed. Order entered for one pair extra depth shoes and 3 pair total contact insoles. Patient qualifies based on diagnoses. -Toenails 1-5 b/l were debrided in length and girth with sterile nail nippers and dremel without iatrogenic bleeding.  -Callus(es) 1st metatarsal head b/l lower extremities pared utilizing sterile scalpel blade without complication or incident. Total number debrided =2. -Patient/POA to call should there be question/concern in the interim. Return in about 3 months (around 08/31/2023).  Freddie Breech, DPM

## 2023-06-08 ENCOUNTER — Ambulatory Visit (INDEPENDENT_AMBULATORY_CARE_PROVIDER_SITE_OTHER): Payer: Medicare HMO | Admitting: Allergy & Immunology

## 2023-06-08 DIAGNOSIS — J309 Allergic rhinitis, unspecified: Secondary | ICD-10-CM

## 2023-06-11 NOTE — Progress Notes (Unsigned)
Follow Up Note  RE: Kimberly Lam MRN: [id number] DOB: 1946/01/29 Date of Office Visit: 06/12/2023  Referring provider: Shirline Frees, NP Primary care provider: Shirline Frees, NP  Chief Complaint: No chief complaint on file.  History of Present Illness: I had the pleasure of seeing [name] for a follow up visit at the [facility] on 06/11/2023. She is a 77 y.o. female, who is being followed for asthma, allergic rhinoconjunctivitis on AIT, nasal septal perforation, food allergy, GERD. Her previous allergy office visit was on 02/06/2023 with Dr. Selena Batten. Today is a regular follow up visit.  Moderate persistent asthma, uncomplicated Past history - Felt better with [facility] but could not afford to purchase after samples ran out. Reports that inhalers cause her mouth to get sores and become white. Interim history -  noted chest tightness lately with exertion. Albuterol was over $40.  Today's spirometry showed restriction.  Daily controller medication(s): Continue Singulair (montelukast) 10 mg daily at night. May use Airsupra rescue inhaler 2 puffs every 4 to 6 hours as needed for shortness of breath, chest tightness, coughing, and wheezing. Do not use more than 12 puffs in 24 hours. May use Airsupra rescue inhaler 2 puffs 5 to 15 minutes prior to strenuous physical activities. Monitor frequency of use. Rinse mouth after each use. Sample given.  Use this inhaler while we figure out how much albuterol is doing to cost you. May use albuterol rescue inhaler 2 puffs every 4 to 6 hours as needed for shortness of breath, chest tightness, coughing, and wheezing. May use albuterol rescue inhaler 2 puffs 5 to 15 minutes prior to strenuous physical activities. Monitor frequency of use.  Let me know if this is not covered - sent into mail order pharmacy.  Get spirometry at next visit.   Perennial and seasonal allergic rhinoconjunctivitis Past history - started AIT on 10/27/2015  (TREE/GRASS-WEED-MITE-CAT-DOG/MOLD-CR) every 2 weeks now unable to go to every 3-4 weeks.  Interim history - still having sinus issues. Saw ENT recommended surgery. [name] not covered. Continue environmental control measures. Continue allergy injections every 2 weeks - given today.  Continue Singulair (montelukast) 10mg  daily at night. Use Flonase (fluticasone) nasal spray 1 spray per nostril twice a day as needed for nasal congestion.  Use azelastine nasal spray 1-2 sprays per nostril twice a day as needed for runny nose/drainage. Use over the counter antihistamines such as Zyrtec (cetirizine), Claritin (loratadine), Allegra (fexofenadine), or Xyzal (levocetirizine) daily as needed. May switch antihistamines every few months. Nasal saline spray (i.e., Simply Saline) or nasal saline lavage (i.e., NeilMed) is recommended as needed and prior to medicated nasal sprays.   Nasal septal perforation Past history - Saw ENT in the past (no intervention). Interim history - unchanged. Follow up with ENT regarding the sinus surgery.   Anaphylactic reaction due to food, subsequent encounter Past history - soy caused increased rhinitis symptoms. Interim history - No reactions. Can't afford Epipen.  Continue to avoid peanuts, milk, and soy.   For mild symptoms you can take over the counter antihistamines such as Benadryl and monitor symptoms closely. If symptoms worsen or if you have severe symptoms including breathing issues, throat closure, significant swelling, whole body hives, severe diarrhea and vomiting, lightheadedness then seek immediate medical care.   Gastroesophageal reflux disease Stable. Continue dietary and lifestyle modifications. Continue omeprazole 40 mg once a day to control reflux.   Return in about 4 months (around 06/07/2023).    Assessment and Plan: Kimberly Lam  is a 77 y.o. female with: No problem-specific Assessment & Plan notes found for this encounter.  No follow-ups on  file.  No orders of the defined types were placed in this encounter.  Lab Orders  No laboratory test(s) ordered today    Diagnostics: Spirometry:  Tracings reviewed. Her effort: {Blank single:19197::"Good reproducible efforts.","It was hard to get consistent efforts and there is a question as to whether this reflects a maximal maneuver.","Poor effort, data can not be interpreted."} FVC: ***L FEV1: ***L, ***% predicted FEV1/FVC ratio: ***% Interpretation: {Blank single:19197::"Spirometry consistent with mild obstructive disease","Spirometry consistent with moderate obstructive disease","Spirometry consistent with severe obstructive disease","Spirometry consistent with possible restrictive disease","Spirometry consistent with mixed obstructive and restrictive disease","Spirometry uninterpretable due to technique","Spirometry consistent with normal pattern","No overt abnormalities noted given today's efforts"}.  Please see scanned spirometry results for details.  Skin Testing: {Blank single:19197::"Select foods","Environmental allergy panel","Environmental allergy panel and select foods","Food allergy panel","None","Deferred due to recent antihistamines use"}. *** Results discussed with patient/family.   Medication List:  Current Outpatient Medications  Medication Sig Dispense Refill  . Accu-Chek FastClix Lancets MISC USE TO CHECK BLOOD GLUCOSE TWICE DAILY 204 each 3  . ACCU-CHEK GUIDE test strip USE AS INSTRUCTED 300 strip 3  . albuterol (VENTOLIN HFA) 108 (90 Base) MCG/ACT inhaler Inhale 2 puffs into the lungs every 4 (four) hours as needed for wheezing or shortness of breath (coughing fits). 18 g 1  . Albuterol-Budesonide (AIRSUPRA) 90-80 MCG/ACT AERO Inhale 2 puffs into the lungs as needed (for shortness of breath, chest tightness, coughing and wheezing. May use 2 puff 5-15 minutes prior to strenuous physical activites. Do no use more than 12 puffs in 24 hours. Rinse mouth after each  use.). 10.7 g 5  . Alcohol Swabs (DROPSAFE ALCOHOL PREP) 70 % PADS USE TO TEST BLOOD GLUCOSE FOUR TIMES DAILY AS DIRECTED 400 each 10  . alendronate (FOSAMAX) 70 MG tablet TAKE 1 TABLET WEEKLY WITH 8 OZ OF PLAIN WATER 30 MINUTES BEFORE FIRST FOOD, DRINK OR MEDS. STAY UPRIGHT FOR 30 MINS 12 tablet 10  . Ascorbic Acid (VITAMIN C) 1000 MG tablet Take 1,000 mg by mouth daily.    Marland Kitchen azelastine (ASTELIN) 0.1 % nasal spray USE 2 SPRAYS IN EACH NOSTRIL TWICE DAILY (NEED MD APPOINTMENT) 120 mL 2  . Azelastine-Fluticasone 137-50 MCG/ACT SUSP Place 1 spray into the nose 2 (two) times daily as needed (nasal symptoms). 69 g 1  . Barberry-Oreg Grape-Goldenseal (BERBERINE COMPLEX PO) Take 1 capsule by mouth daily.    . Blood Glucose Monitoring Suppl (ACCU-CHEK GUIDE) w/Device KIT 1 kit by Does not apply route as directed. 1 kit 0  . CINNAMON PO Take 1 tablet by mouth as needed (for blood glucose).    . Cranberry 450 MG CAPS Take 450 mg by mouth daily.    . Cyanocobalamin (VITAMIN B-12 CR) 1500 MCG TBCR Take 1,500 mcg by mouth daily.    . Echinacea-Goldenseal (ECHINACEA COMB/GOLDEN SEAL PO) Take 1 tablet by mouth as needed (for prevention). Reported on 12/16/2015    . EPINEPHrine 0.3 mg/0.3 mL IJ SOAJ injection Inject 0.3 mg into the muscle as needed for anaphylaxis. 3 each 1  . fluticasone (FLONASE) 50 MCG/ACT nasal spray USE 2 SPRAYS IN EACH NOSTRIL EVERY DAY 48 g 0  . gabapentin (NEURONTIN) 300 MG capsule TAKE 2 CAPSULES BY MOUTH 3  TIMES DAILY (Patient taking differently: Take 600 mg by mouth 3 (three) times daily as needed (for leg cramps).) 540 capsule 2  . glipiZIDE (GLUCOTROL  XL) 5 MG 24 hr tablet TAKE 1 TABLET EVERY DAY WITH BREAKFAST 90 tablet 3  . Green Tea, Camillia sinensis, (GREEN TEA PO) Take 1 tablet by mouth as needed (for wellbeing). Reported on 12/16/2015    . Lactobacillus (ACIDOPHILUS PROBIOTIC PO) Take 1 tablet by mouth daily.    Marland Kitchen levocetirizine (XYZAL) 5 MG tablet TAKE 1 TABLET EVERY EVENING  30 tablet 0  . Melatonin ER 5 MG TBCR Take 5 mg by mouth at bedtime as needed (FOR SLEEP).    . metFORMIN (GLUCOPHAGE-XR) 500 MG 24 hr tablet TAKE 2 TABLETS TWICE DAILY 360 tablet 3  . montelukast (SINGULAIR) 10 MG tablet Take 1 tablet (10 mg total) by mouth at bedtime. 90 tablet 3  . Omega-3 Fatty Acids (OMEGA 3 PO) Take 1 tablet by mouth as needed.    Marland Kitchen omeprazole (PRILOSEC) 40 MG capsule TAKE 1 CAPSULE EVERY DAY 90 capsule 3  . Polyethyl Glycol-Propyl Glycol (SYSTANE FREE OP) Apply 1 drop to eye daily. Uses thera work for dr eyes per pt    . Potassium 95 MG TABS Take 95 mg by mouth daily.    . ramipril (ALTACE) 2.5 MG capsule Take 1 capsule (2.5 mg total) by mouth daily. 90 capsule 3  . simvastatin (ZOCOR) 20 MG tablet TAKE 1 TABLET EVERY DAY 90 tablet 10  . Stinging Nettle 2 % POWD Take 1 tablet by mouth daily at 12 noon. Reported on 12/16/2015    . traZODone (DESYREL) 50 MG tablet TAKE 1/2 TO 1 TABLET AT BEDTIME AS NEEDED FOR SLEEP 90 tablet 3  . TURMERIC PO Take 800 mg by mouth daily as needed (for well being). Reported on 12/16/2015     No current facility-administered medications for this visit.   Allergies: Allergies  Allergen Reactions  . Peanut-Containing Drug Products Anaphylaxis  . Egg-Derived Products Nausea Only    Nausea only- can eat cakes with eggs   . Milk-Related Compounds Nausea And Vomiting    Milk and cheese *Can drink 1 cup a day*  . Actos [Pioglitazone] Nausea Only  . Soy Allergy Nausea Only    Pt states had allergy testing and was told allergic to soy-causes nausea but no other reactions  . Vicodin [Hydrocodone-Acetaminophen] Nausea Only   I reviewed her past medical history, social history, family history, and environmental history and no significant changes have been reported from her previous visit.  Review of Systems  Constitutional:  Negative for appetite change, chills, fever and unexpected weight change.  HENT:  Positive for congestion. Negative for  rhinorrhea.   Eyes:  Negative for itching.  Respiratory:  Positive for chest tightness. Negative for cough, shortness of breath and wheezing.   Gastrointestinal:  Negative for abdominal pain.  Skin:  Negative for rash.  Allergic/Immunologic: Positive for environmental allergies and food allergies.  Neurological:  Negative for headaches.   Objective: There were no vitals taken for this visit. There is no height or weight on file to calculate BMI. Physical Exam Vitals and nursing note reviewed.  Constitutional:      Appearance: Normal appearance. She is well-developed.  HENT:     Head: Normocephalic and atraumatic.     Right Ear: Tympanic membrane and external ear normal.     Left Ear: Tympanic membrane and external ear normal.     Nose:     Comments: Perforated nasal septum noted.    Mouth/Throat:     Mouth: Mucous membranes are moist.     Pharynx: Oropharynx  is clear.  Eyes:     Conjunctiva/sclera: Conjunctivae normal.  Cardiovascular:     Rate and Rhythm: Normal rate and regular rhythm.     Heart sounds: Normal heart sounds. No murmur heard. Pulmonary:     Effort: Pulmonary effort is normal.     Breath sounds: Normal breath sounds. No wheezing, rhonchi or rales.  Musculoskeletal:     Cervical back: Neck supple.  Skin:    General: Skin is warm.     Findings: No rash.  Neurological:     Mental Status: She is alert and oriented to person, place, and time.  Psychiatric:        Behavior: Behavior normal.  Previous notes and tests were reviewed. The plan was reviewed with the patient/family, and all questions/concerned were addressed.  It was my pleasure to see Gissele today and participate in her care. Please feel free to contact me with any questions or concerns.  Sincerely,  Wyline Mood, DO Allergy & Immunology  [facility] office: [phone] [facility] office: [phone]

## 2023-06-12 ENCOUNTER — Ambulatory Visit (INDEPENDENT_AMBULATORY_CARE_PROVIDER_SITE_OTHER): Payer: Medicare HMO | Admitting: Allergy

## 2023-06-12 ENCOUNTER — Other Ambulatory Visit: Payer: Self-pay

## 2023-06-12 ENCOUNTER — Encounter: Payer: Self-pay | Admitting: Allergy

## 2023-06-12 VITALS — BP 124/82 | HR 78 | Temp 97.9°F | Resp 16 | Ht 64.0 in | Wt 139.8 lb

## 2023-06-12 DIAGNOSIS — J3089 Other allergic rhinitis: Secondary | ICD-10-CM

## 2023-06-12 DIAGNOSIS — J3489 Other specified disorders of nose and nasal sinuses: Secondary | ICD-10-CM

## 2023-06-12 DIAGNOSIS — T7800XD Anaphylactic reaction due to unspecified food, subsequent encounter: Secondary | ICD-10-CM

## 2023-06-12 DIAGNOSIS — J4540 Moderate persistent asthma, uncomplicated: Secondary | ICD-10-CM | POA: Diagnosis not present

## 2023-06-12 DIAGNOSIS — R42 Dizziness and giddiness: Secondary | ICD-10-CM | POA: Insufficient documentation

## 2023-06-12 DIAGNOSIS — K219 Gastro-esophageal reflux disease without esophagitis: Secondary | ICD-10-CM | POA: Diagnosis not present

## 2023-06-12 NOTE — Assessment & Plan Note (Signed)
Past history - soy caused increased rhinitis symptoms. Interim history - sometimes eats peanuts/milk which causes nasal congestion. Can't afford Epipen.  Continue to avoid peanuts, milk, and soy.   For mild symptoms you can take over the counter antihistamines such as Benadryl and monitor symptoms closely. If symptoms worsen or if you have severe symptoms including breathing issues, throat closure, significant swelling, whole body hives, severe diarrhea and vomiting, lightheadedness then seek immediate medical care.

## 2023-06-12 NOTE — Patient Instructions (Addendum)
Asthma Daily controller medication(s): Continue Singulair (montelukast) 10 mg daily at night. May use albuterol rescue inhaler 2 puffs or nebulizer every 4 to 6 hours as needed for shortness of breath, chest tightness, coughing, and wheezing. May use albuterol rescue inhaler 2 puffs 5 to 15 minutes prior to strenuous physical activities. Monitor frequency of use - if you need to use it more than twice per week on a consistent basis let us know.  Asthma control goals:  Full participation in all desired activities (may need albuterol before activity) Albuterol use two times or less a week on average (not counting use with activity) Cough interfering with sleep two times or less a month Oral steroids no more than once a year No hospitalizations   Environmental allergies Continue environmental control measures. Continue allergy injections every 2 weeks.  Continue Singulair (montelukast) 10mg  daily at night. Use Flonase (fluticasone) nasal spray 1 spray per nostril twice a day as needed for nasal congestion.  Use azelastine nasal spray 1-2 sprays per nostril twice a day as needed for runny nose/drainage. Use over the counter antihistamines such as Zyrtec (cetirizine), Claritin (loratadine), Allegra (fexofenadine), or Xyzal (levocetirizine) daily as needed. May switch antihistamines every few months. Nasal saline spray (i.e., Simply Saline) or nasal saline lavage (i.e., NeilMed) is recommended as needed and prior to medicated nasal sprays.  Septal perforation/nasal congestion Follow up with ENT regarding the sinus surgery.  Reflux Continue dietary and lifestyle modifications. Continue omeprazole 40 mg once a day to control reflux.  Food allergy Continue to avoid peanuts, milk, and soy.   For mild symptoms you can take over the counter antihistamines such as Benadryl and monitor symptoms closely. If symptoms worsen or if you have severe symptoms including breathing issues, throat closure,  significant swelling, whole body hives, severe diarrhea and vomiting, lightheadedness then seek immediate medical care.  Follow up in 4 months or sooner if needed. Follow up with PCP regarding the dizziness.

## 2023-06-12 NOTE — Assessment & Plan Note (Signed)
C/o postural dizziness. Vitals normal today. Follow up with PCP regarding the dizziness.

## 2023-06-12 NOTE — Assessment & Plan Note (Signed)
Past history - Saw ENT in the past (no intervention). Interim history - unchanged. Follow up with ENT regarding the sinus surgery. 

## 2023-06-12 NOTE — Assessment & Plan Note (Signed)
Past history - Felt better with [facility] but could not afford to purchase after samples ran out. Reports that inhalers cause her mouth to get sores and become white. Interim history - using albuterol once per week. Airsupra not covered. Today's spirometry showed moderate obstruction - denies symptoms besides nasal congestion.  Daily controller medication(s): Continue Singulair (montelukast) 10 mg daily at night. May use albuterol rescue inhaler 2 puffs or nebulizer every 4 to 6 hours as needed for shortness of breath, chest tightness, coughing, and wheezing. May use albuterol rescue inhaler 2 puffs 5 to 15 minutes prior to strenuous physical activities. Monitor frequency of use - if you need to use it more than twice per week on a consistent basis let us know.

## 2023-06-12 NOTE — Assessment & Plan Note (Signed)
Stable. Continue dietary and lifestyle modifications. Continue omeprazole 40 mg once a day to control reflux. 

## 2023-06-12 NOTE — Assessment & Plan Note (Signed)
Past history - started AIT on 10/27/2015 (TREE/GRASS-WEED-MITE-CAT-DOG/MOLD-CR) every 2 weeks now unable to go to every 3-4 weeks. [name] not covered. Interim history - nasal congestion unchanged. Doing well with AIT.  Continue environmental control measures. Continue allergy injections every 2 weeks.  Continue Singulair (montelukast) 10mg  daily at night. Use Flonase (fluticasone) nasal spray 1 spray per nostril twice a day as needed for nasal congestion.  Use azelastine nasal spray 1-2 sprays per nostril twice a day as needed for runny nose/drainage. Use over the counter antihistamines such as Zyrtec (cetirizine), Claritin (loratadine), Allegra (fexofenadine), or Xyzal (levocetirizine) daily as needed. May switch antihistamines every few months. Nasal saline spray (i.e., Simply Saline) or nasal saline lavage (i.e., NeilMed) is recommended as needed and prior to medicated nasal sprays.

## 2023-06-13 ENCOUNTER — Telehealth: Payer: Self-pay | Admitting: Neurology

## 2023-06-13 ENCOUNTER — Ambulatory Visit: Payer: Medicare HMO | Admitting: Neurology

## 2023-06-13 ENCOUNTER — Encounter: Payer: Self-pay | Admitting: Neurology

## 2023-06-13 VITALS — BP 134/60 | HR 70 | Ht 64.0 in | Wt 139.0 lb

## 2023-06-13 DIAGNOSIS — M5416 Radiculopathy, lumbar region: Secondary | ICD-10-CM | POA: Diagnosis not present

## 2023-06-13 MED ORDER — GABAPENTIN 300 MG PO CAPS: 600.0000 mg | ORAL_CAPSULE | Freq: Three times a day (TID) | ORAL | 3 refills | Status: DC | PRN

## 2023-06-13 NOTE — Telephone Encounter (Signed)
[name]: [id number] exp. 06/13/23-07/13/23 sent to GI [phone]

## 2023-06-13 NOTE — Progress Notes (Signed)
ASSESSMENT AND PLAN 77 y.o. year old female   Worsening chronic low back pain, radiating pain to bilateral lower extremities, right worse than left, Essentially normal neurological examination,  Personally reviewed MRI of the lumbar in October 2016, transitional L6-S1 vertebral body, 8 mm anterolisthesis of L5 upon S1 with severe facet hypertrophy, mild spinal stenosis and moderately severe right foraminal stenosis, with potential compression of right L5 nerve roots, degenerative changes of S1 and S2 with severe right foraminal stenosis, potential compression of exiting right S1 nerve roots,  Discussed the option with patient, consider repeating MRI of lumbar spine, higher dose of gabapentin, 300 mg 2 tablets 3 times a day  If MRI showed worsening findings, may consider epidural injection versus neurosurgical refer  DIAGNOSTIC DATA (LABS, IMAGING, TESTING) - I reviewed patient records, labs, notes, testing and imaging myself where available.     HISTORY: Kimberly Lam is a 77 years old female, seen in refer by her primary care nurse practitioner Shirline Frees for evaluation of leg cramping, tremors, initial evaluation was on January 23, 2018.     She has past medical history of hypertension, diabetes, hyperlipidemia   I saw her initially in 2016 for evaluation of low back pain, radiating pain to bilateral hip, bilateral leg muscle cramping, and weakness.   She has history of low back pain since 2006, midline low back, occasionally go to posterior thigh, go down below her knee, worsening with prolonged standing, or walking,   Previous her low back pain has responded very well to epidural injection, but the benefit gradually fades away, over the years, she complains of worsening low back pain, radiating pain to bilateral lower extremity again, in addition, she complains of bilateral feet paresthesia,    She had a history of diabetes, she denies gait difficulty, no bowel and  bladder incontinence, she denies bilateral upper extremity paresthesia or weakness.    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     Note from Dr. Harmon Pier, [facility], [place] in 2017, she was diagnosed with degenerative spondylolisthesis L4-L5, L5-S1, neurogenic claudication, low back pain, received epidural injection in Feb 2015, benefit lasted about one year   MRI of lumbar spine December 2016, There is a transitional L6/S1 vertebral body. 8 mm of anterolisthesis of L5 upon L6/S1 of a degenerative nature due to severe facet hypertrophy. At this level, there is mild spinal stenosis and moderately severe right foraminal narrowing. There could be compression of the right L5 nerve root.  Degenerative changes at L6/S1-S2 lead to severe right foraminal narrowing that could lead to compression of the exiting right S1 nerve root   She continue complains of chronic low back pain, today she also complains of new onset right hand shaking, that was intermittent, getting worse when she holding utensils, writing, mild involvement on the left side, she continue complains of frequent lower extremity cramping especially on the right side, difficulty to walk in the morning.  There was no family history of tremor   UPDATE Nov 5th 2019: She was given prescription of gabapentin for her bilateral feet paresthesia, 300 mg 4 times a day does help her lower extremity cramps, but complains of daytime sleepiness, but she often woke up in the middle of the night complains of left muscle cramping, difficulty going to sleep,   We again personally reviewed MRI of  lumbar spine in December 2016, transitional L6-S1 vertebral body, 8 mm of anterolisthesis of L5 upon S1 with severe facet hypertrophy, mild spinal stenosis, moderate severe right foraminal narrowing, potential  compression of right L5 nerve roots,   UPDATE July 10 2019: She is overall doing much better, taking gabapentin 300 mg 2 tablets before bedtime, open skip daytime dose, epidural injection few months ago has helped her as well, she exercise regularly, denies gait abnormality, denies bowel and bladder incontinence  UPDATE June 25th 2024: Today she complains gradual worsening low back pain, radiating pain to bilateral lower extremity, bilateral lower extremity muscle cramping, bilateral feet muscle cramping, denies gait abnormality, developed worsening urinary urgency over the years have to wear pads  Personally reviewed MRI of lumbar spine from October 2016:  1.    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 This could be confirmed with plain films if any procedures are being planned. 2.    8 mm of anterolisthesis of L5 upon L6/S1 of a degenerative nature due to severe facet hypertrophy. At this level, there is mild spinal stenosis and moderately severe right foraminal narrowing. There could be compression of the right L5 nerve root. 3.   Degenerative changes at L6/S1-S2 lead to severe right foraminal narrowing that could lead to compression of the exiting right S1 nerve root    REVIEW OF SYSTEMS: Out of a complete 14 system review of symptoms, the patient complains only of the following symptoms, and all other reviewed systems are negative.  See HPI  PHYSICAL EXAM  Vitals:   06/13/23 1315  BP: 134/60  Pulse: 70  Weight: 139 lb (63 kg)  Height: 5\' 4"  (1.626 m)    Body mass index is 23.86 kg/m.  Generalized: Well developed, in no acute distress   Neurological examination  Mentation: Alert oriented to time, place, history taking. Follows all commands speech and language fluent Cranial nerve II-XII: Pupils were equal round reactive to light. Extraocular movements were full, visual field were full on confrontational test. Facial sensation and strength were normal. Head turning and  shoulder shrug  were normal and symmetric. Motor: The motor testing reveals 5 over 5 strength of all 4 extremities. Good symmetric motor tone is noted throughout. Wearing right knee sleeve Sensory: Sensory testing is intact to soft touch on all 4 extremities. No evidence of extinction is noted.  Coordination: Cerebellar testing reveals good finger-nose-finger and heel-to-shin bilaterally. No tremor, very mild tremor with spiral draw Gait and station: Gait is normal, steady. Reflexes: Deep tendon reflexes are symmetric and normal bilaterally.    ALLERGIES: Allergies  Allergen Reactions   Peanut-Containing Drug Products Anaphylaxis   Egg-Derived Products Nausea Only    Nausea only- can eat cakes with eggs    Milk-Related Compounds Nausea And Vomiting    Milk and cheese *Can drink 1 cup a day*   Actos [Pioglitazone] Nausea Only   Soy Allergy Nausea Only    Pt states had allergy testing and was told allergic to soy-causes nausea but no other reactions   Vicodin [Hydrocodone-Acetaminophen] Nausea Only    HOME MEDICATIONS: Outpatient Medications Prior to Visit  Medication Sig Dispense Refill   Accu-Chek FastClix Lancets MISC USE TO CHECK BLOOD GLUCOSE TWICE DAILY 204 each 3   ACCU-CHEK GUIDE test strip USE AS INSTRUCTED 300 strip 3   albuterol (VENTOLIN HFA) 108 (90 Base) MCG/ACT inhaler Inhale 2 puffs into the lungs every 4 (four) hours as needed for wheezing or shortness of breath (coughing fits).  18 g 1   Alcohol Swabs (DROPSAFE ALCOHOL PREP) 70 % PADS USE TO TEST BLOOD GLUCOSE FOUR TIMES DAILY AS DIRECTED 400 each 10   alendronate (FOSAMAX) 70 MG tablet TAKE 1 TABLET WEEKLY WITH 8 OZ OF PLAIN WATER 30 MINUTES BEFORE FIRST FOOD, DRINK OR MEDS. STAY UPRIGHT FOR 30 MINS 12 tablet 10   Ascorbic Acid (VITAMIN C) 1000 MG tablet Take 1,000 mg by mouth daily.     azelastine (ASTELIN) 0.1 % nasal spray USE 2 SPRAYS IN EACH NOSTRIL TWICE DAILY (NEED MD APPOINTMENT) 120 mL 2    Azelastine-Fluticasone 137-50 MCG/ACT SUSP Place 1 spray into the nose 2 (two) times daily as needed (nasal symptoms). 69 g 1   Barberry-Oreg Grape-Goldenseal (BERBERINE COMPLEX PO) Take 1 capsule by mouth daily.     Blood Glucose Monitoring Suppl (ACCU-CHEK GUIDE) w/Device KIT 1 kit by Does not apply route as directed. 1 kit 0   CINNAMON PO Take 1 tablet by mouth as needed (for blood glucose).     Cranberry 450 MG CAPS Take 450 mg by mouth daily.     Cyanocobalamin (VITAMIN B-12 CR) 1500 MCG TBCR Take 1,500 mcg by mouth daily.     Echinacea-Goldenseal (ECHINACEA COMB/GOLDEN SEAL PO) Take 1 tablet by mouth as needed (for prevention). Reported on 12/16/2015     EPINEPHrine 0.3 mg/0.3 mL IJ SOAJ injection Inject 0.3 mg into the muscle as needed for anaphylaxis. 3 each 1   fluticasone (FLONASE) 50 MCG/ACT nasal spray USE 2 SPRAYS IN EACH NOSTRIL EVERY DAY 48 g 0   gabapentin (NEURONTIN) 300 MG capsule TAKE 2 CAPSULES BY MOUTH 3  TIMES DAILY (Patient taking differently: Take 600 mg by mouth 3 (three) times daily as needed (for leg cramps).) 540 capsule 2   glipiZIDE (GLUCOTROL XL) 5 MG 24 hr tablet TAKE 1 TABLET EVERY DAY WITH BREAKFAST 90 tablet 3   Green Tea, Camillia sinensis, (GREEN TEA PO) Take 1 tablet by mouth as needed (for wellbeing). Reported on 12/16/2015     Lactobacillus (ACIDOPHILUS PROBIOTIC PO) Take 1 tablet by mouth daily.     levocetirizine (XYZAL) 5 MG tablet TAKE 1 TABLET EVERY EVENING 30 tablet 0   Melatonin ER 5 MG TBCR Take 5 mg by mouth at bedtime as needed (FOR SLEEP).     metFORMIN (GLUCOPHAGE-XR) 500 MG 24 hr tablet TAKE 2 TABLETS TWICE DAILY 360 tablet 3   montelukast (SINGULAIR) 10 MG tablet Take 1 tablet (10 mg total) by mouth at bedtime. 90 tablet 3   Omega-3 Fatty Acids (OMEGA 3 PO) Take 1 tablet by mouth as needed.     omeprazole (PRILOSEC) 40 MG capsule TAKE 1 CAPSULE EVERY DAY 90 capsule 3   Polyethyl Glycol-Propyl Glycol (SYSTANE FREE OP) Apply 1 drop to eye daily.  Uses thera work for dr eyes per pt     Potassium 95 MG TABS Take 95 mg by mouth daily.     ramipril (ALTACE) 2.5 MG capsule Take 1 capsule (2.5 mg total) by mouth daily. 90 capsule 3   simvastatin (ZOCOR) 20 MG tablet TAKE 1 TABLET EVERY DAY 90 tablet 10   Stinging Nettle 2 % POWD Take 1 tablet by mouth daily at 12 noon. Reported on 12/16/2015     traZODone (DESYREL) 50 MG tablet TAKE 1/2 TO 1 TABLET AT BEDTIME AS NEEDED FOR SLEEP 90 tablet 3   TURMERIC PO Take 800 mg by mouth daily as needed (for well being). Reported on 12/16/2015  No facility-administered medications prior to visit.    PAST MEDICAL HISTORY: Past Medical History:  Diagnosis Date   Acid reflux    Allergic rhinitis    Allergy    Anemia    Arthritis    per MD- pt states no s/s of this    Asthma    Bilateral sciatica    Cataract    removed both eyes    Chronic bilateral low back pain    Colon polyps    Diverticulosis    DM type 2 (diabetes mellitus, type 2) (HCC)    Environmental allergies    flowers, pollen, trees   GERD (gastroesophageal reflux disease)    Hyperlipidemia    Low back pain    Migraine    past hx- control as allergies are controlled    Osteoporosis    PONV (postoperative nausea and vomiting)     PAST SURGICAL HISTORY: Past Surgical History:  Procedure Laterality Date   CATARACT EXTRACTION, BILATERAL Bilateral 2012   CESAREAN SECTION     x 2   COLONOSCOPY     > 10 yrs ago- unsure if removed polyps as was told sev.different things after this colon    DENTAL SURGERY     molars removed    DILATION AND CURETTAGE OF UTERUS     NASAL SINUS SURGERY     TUBAL LIGATION      FAMILY HISTORY: Family History  Problem Relation Age of Onset   Asthma Father    Emphysema Father    Alcohol abuse Father    Diabetes Mellitus II Brother        x 2   Hyperlipidemia Brother    Hypertension Brother    Pancreatic cancer Brother    Prostate cancer Brother    Diabetes Brother    Heart disease  Maternal Grandmother        hardening of the arteries   Heart murmur Daughter    Glaucoma Brother    Breast cancer Neg Hx    Colon cancer Neg Hx    Colon polyps Neg Hx    Esophageal cancer Neg Hx    Rectal cancer Neg Hx    Stomach cancer Neg Hx     SOCIAL HISTORY: Social History   Socioeconomic History   Marital status: Married    Spouse name: Not on file   Number of children: 2   Years of education: 16   Highest education level: Not on file  Occupational History   Occupation: Retired  Tobacco Use   Smoking status: Never   Smokeless tobacco: Never  Vaping Use   Vaping Use: Never used  Substance and Sexual Activity   Alcohol use: No    Alcohol/week: 0.0 standard drinks of alcohol   Drug use: No   Sexual activity: Not on file  Other Topics Concern   Not on file  Social History Narrative      Married 1 son one daughter, relocated to [place] after living in [place] for many years. 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   No caffeine.          Lives at home with her husband and brother.   Right-handed.   No caffeine use.   Social Determinants of Health   Financial Resource Strain: Low Risk  (09/16/2022)   Overall Financial Resource Strain (CARDIA)    Difficulty of Paying Living Expenses: Not hard at all  Food Insecurity: No Food Insecurity (09/16/2022)  Hunger Vital Sign    Worried About Running Out of Food in the Last Year: Never true    Ran Out of Food in the Last Year: Never true  Transportation Needs: No Transportation Needs (09/16/2022)   PRAPARE - Administrator, Civil Service (Medical): No    Lack of Transportation (Non-Medical): No  Physical Activity: Insufficiently Active (09/16/2022)   Exercise Vital Sign    Days of Exercise per Week: 4 days    Minutes of Exercise per Session: 30 min  Stress: No Stress Concern Present (09/16/2022)   [employer] of Occupational Health - Occupational Stress Questionnaire    Feeling of  Stress : Not at all  Social Connections: Socially Integrated (09/16/2022)   Social Connection and Isolation Panel [NHANES]    Frequency of Communication with Friends and Family: More than three times a week    Frequency of Social Gatherings with Friends and Family: More than three times a week    Attends Religious Services: More than 4 times per year    Active Member of [employer] or Organizations: Yes    Attends Banker Meetings: More than 4 times per year    Marital Status: Married  Catering manager Violence: Not At Risk (09/16/2022)   Humiliation, Afraid, Rape, and Kick questionnaire    Fear of Current or Ex-Partner: No    Emotionally Abused: No    Physically Abused: No    Sexually Abused: No    Levert Feinstein, M.D. Ph.D.  [facility] [address] [place], [place] [address] Phone: [phone] Fax:      [phone]

## 2023-06-26 ENCOUNTER — Ambulatory Visit (INDEPENDENT_AMBULATORY_CARE_PROVIDER_SITE_OTHER): Payer: Medicare HMO | Admitting: *Deleted

## 2023-06-26 DIAGNOSIS — J309 Allergic rhinitis, unspecified: Secondary | ICD-10-CM | POA: Diagnosis not present

## 2023-07-01 ENCOUNTER — Other Ambulatory Visit: Payer: Self-pay | Admitting: Allergy

## 2023-07-03 ENCOUNTER — Other Ambulatory Visit: Payer: Medicare HMO

## 2023-07-06 ENCOUNTER — Other Ambulatory Visit: Payer: Self-pay | Admitting: Adult Health

## 2023-07-06 DIAGNOSIS — Z1231 Encounter for screening mammogram for malignant neoplasm of breast: Secondary | ICD-10-CM

## 2023-07-07 ENCOUNTER — Ambulatory Visit (INDEPENDENT_AMBULATORY_CARE_PROVIDER_SITE_OTHER): Payer: Medicare HMO

## 2023-07-07 DIAGNOSIS — J309 Allergic rhinitis, unspecified: Secondary | ICD-10-CM | POA: Diagnosis not present

## 2023-07-08 ENCOUNTER — Other Ambulatory Visit: Payer: Self-pay | Admitting: Adult Health

## 2023-07-08 DIAGNOSIS — E119 Type 2 diabetes mellitus without complications: Secondary | ICD-10-CM

## 2023-07-12 ENCOUNTER — Encounter: Payer: Self-pay | Admitting: Neurology

## 2023-07-13 NOTE — Telephone Encounter (Signed)
Cohere Berkley Harvey: [id number] exp. 07/13/23-09/09/23 sent to GI [phone]

## 2023-07-18 ENCOUNTER — Ambulatory Visit (INDEPENDENT_AMBULATORY_CARE_PROVIDER_SITE_OTHER): Payer: Medicare HMO

## 2023-07-18 DIAGNOSIS — J309 Allergic rhinitis, unspecified: Secondary | ICD-10-CM | POA: Diagnosis not present

## 2023-08-02 ENCOUNTER — Ambulatory Visit (INDEPENDENT_AMBULATORY_CARE_PROVIDER_SITE_OTHER): Payer: Medicare HMO | Admitting: *Deleted

## 2023-08-02 DIAGNOSIS — J309 Allergic rhinitis, unspecified: Secondary | ICD-10-CM | POA: Diagnosis not present

## 2023-08-03 DIAGNOSIS — J3081 Allergic rhinitis due to animal (cat) (dog) hair and dander: Secondary | ICD-10-CM | POA: Diagnosis not present

## 2023-08-03 NOTE — Progress Notes (Signed)
VIALS EXP 08-02-24

## 2023-08-04 DIAGNOSIS — J301 Allergic rhinitis due to pollen: Secondary | ICD-10-CM | POA: Diagnosis not present

## 2023-08-07 DIAGNOSIS — J302 Other seasonal allergic rhinitis: Secondary | ICD-10-CM | POA: Diagnosis not present

## 2023-08-10 ENCOUNTER — Ambulatory Visit: Payer: Medicare HMO

## 2023-08-14 ENCOUNTER — Ambulatory Visit (INDEPENDENT_AMBULATORY_CARE_PROVIDER_SITE_OTHER): Payer: Medicare HMO | Admitting: *Deleted

## 2023-08-14 DIAGNOSIS — J309 Allergic rhinitis, unspecified: Secondary | ICD-10-CM

## 2023-08-15 ENCOUNTER — Ambulatory Visit: Admission: RE | Admit: 2023-08-15 | Payer: Medicare HMO | Source: Ambulatory Visit

## 2023-08-15 DIAGNOSIS — Z1231 Encounter for screening mammogram for malignant neoplasm of breast: Secondary | ICD-10-CM

## 2023-08-16 ENCOUNTER — Other Ambulatory Visit: Payer: Self-pay | Admitting: Allergy

## 2023-08-22 ENCOUNTER — Other Ambulatory Visit: Payer: Self-pay | Admitting: *Deleted

## 2023-08-22 ENCOUNTER — Ambulatory Visit
Admission: RE | Admit: 2023-08-22 | Discharge: 2023-08-22 | Disposition: A | Discharge status: Home / Self Care | Payer: Medicare HMO | Source: Ambulatory Visit | Attending: Neurology | Admitting: Neurology

## 2023-08-22 DIAGNOSIS — M5416 Radiculopathy, lumbar region: Secondary | ICD-10-CM | POA: Diagnosis not present

## 2023-08-22 MED ORDER — AZELASTINE HCL 0.1 % NA SOLN: NASAL | 1 refills | Status: DC

## 2023-08-24 ENCOUNTER — Telehealth: Payer: Self-pay

## 2023-08-24 NOTE — Telephone Encounter (Signed)
 Call to patient, no answer. Left message to return call to office.

## 2023-08-24 NOTE — Telephone Encounter (Signed)
-----   Message from Kimberly Lam sent at 08/23/2023  6:26 PM EDT ----- Please still call patient,  MRI of lumbar spine showed moderate to severe L4-5, S1-2 stenosis, I have referred her to neurosurgeon for evaluation

## 2023-08-26 ENCOUNTER — Encounter: Payer: Self-pay | Admitting: Adult Health

## 2023-08-26 DIAGNOSIS — E119 Type 2 diabetes mellitus without complications: Secondary | ICD-10-CM

## 2023-08-28 ENCOUNTER — Telehealth: Payer: Self-pay

## 2023-08-28 ENCOUNTER — Other Ambulatory Visit: Payer: Self-pay | Admitting: Neurology

## 2023-08-28 NOTE — Telephone Encounter (Signed)
Called and patient has seen [facility]

## 2023-08-28 NOTE — Telephone Encounter (Signed)
----- 

## 2023-08-30 ENCOUNTER — Ambulatory Visit (INDEPENDENT_AMBULATORY_CARE_PROVIDER_SITE_OTHER): Payer: Medicare HMO | Admitting: *Deleted

## 2023-08-30 DIAGNOSIS — J309 Allergic rhinitis, unspecified: Secondary | ICD-10-CM

## 2023-08-31 MED ORDER — BLOOD GLUCOSE TEST VI STRP
1.0000 | ORAL_STRIP | Freq: Three times a day (TID) | 0 refills | Status: AC
Start: 2023-08-31 — End: 2023-09-30

## 2023-08-31 MED ORDER — BLOOD GLUCOSE MONITORING SUPPL DEVI: 1.0000 | Freq: Three times a day (TID) | 0 refills | Status: DC

## 2023-08-31 MED ORDER — LANCET DEVICE MISC: 1.0000 | Freq: Three times a day (TID) | 0 refills | Status: DC

## 2023-08-31 MED ORDER — LANCETS MISC. MISC
1.0000 | Freq: Three times a day (TID) | 0 refills | Status: AC
Start: 2023-08-31 — End: 2023-09-30

## 2023-09-06 ENCOUNTER — Encounter: Payer: Self-pay | Admitting: Adult Health

## 2023-09-06 ENCOUNTER — Ambulatory Visit (INDEPENDENT_AMBULATORY_CARE_PROVIDER_SITE_OTHER): Payer: Medicare HMO | Admitting: Adult Health

## 2023-09-06 VITALS — BP 120/60 | HR 74 | Temp 98.1°F | Ht 63.75 in | Wt 140.0 lb

## 2023-09-06 DIAGNOSIS — Z0000 Encounter for general adult medical examination without abnormal findings: Secondary | ICD-10-CM | POA: Diagnosis not present

## 2023-09-06 DIAGNOSIS — E785 Hyperlipidemia, unspecified: Secondary | ICD-10-CM

## 2023-09-06 DIAGNOSIS — G4700 Insomnia, unspecified: Secondary | ICD-10-CM | POA: Diagnosis not present

## 2023-09-06 DIAGNOSIS — M8589 Other specified disorders of bone density and structure, multiple sites: Secondary | ICD-10-CM | POA: Diagnosis not present

## 2023-09-06 DIAGNOSIS — K2100 Gastro-esophageal reflux disease with esophagitis, without bleeding: Secondary | ICD-10-CM

## 2023-09-06 DIAGNOSIS — M5416 Radiculopathy, lumbar region: Secondary | ICD-10-CM

## 2023-09-06 DIAGNOSIS — E1169 Type 2 diabetes mellitus with other specified complication: Secondary | ICD-10-CM

## 2023-09-06 DIAGNOSIS — H903 Sensorineural hearing loss, bilateral: Secondary | ICD-10-CM | POA: Diagnosis not present

## 2023-09-06 DIAGNOSIS — E119 Type 2 diabetes mellitus without complications: Secondary | ICD-10-CM | POA: Diagnosis not present

## 2023-09-06 DIAGNOSIS — Z7984 Long term (current) use of oral hypoglycemic drugs: Secondary | ICD-10-CM

## 2023-09-06 MED ORDER — ALENDRONATE SODIUM 70 MG PO TABS
ORAL_TABLET | ORAL | 10 refills | Status: DC
Start: 2023-09-06 — End: 2024-02-06

## 2023-09-06 MED ORDER — SUCRALFATE 1 GM/10ML PO SUSP
1.0000 g | Freq: Three times a day (TID) | ORAL | 2 refills | Status: AC
Start: 2023-09-06 — End: ?

## 2023-09-06 NOTE — Patient Instructions (Signed)
It was great seeing you today   We will follow up with you regarding your lab work   Please let me know if you need anything

## 2023-09-06 NOTE — Progress Notes (Signed)
Subjective:    Patient ID: Kimberly Lam, female    DOB: 08-Feb-1946, 77 y.o.   MRN: [id number]  HPI Patient presents for yearly preventative medicine examination. She is a pleasant 77 year old female who  has a past medical history of Acid reflux, Allergic rhinitis, Allergy, Anemia, Arthritis, Asthma, Bilateral sciatica, Cataract, Chronic bilateral low back pain, Colon polyps, Diverticulosis, DM type 2 (diabetes mellitus, type 2) (HCC), Environmental allergies, GERD (gastroesophageal reflux disease), Hyperlipidemia, Low back pain, Migraine, Osteoporosis, and PONV (postoperative nausea and vomiting).  Diabetes mellitus type 2-is managed with metformin 1000 mg extended release twice daily and glipzide 5 mg XL daily.   She does check her blood sugars at home and reports readings in the 100s to 200s, depending on what she eats.  She denies episodes of hypoglycemia.  Does take ramipril 2.5 mg daily for kidney protection. Her last A1c was 7.7 in February 2023, this was up from 7.1- 3 months prior.  She has been working on staying more active and eating healthier. Lab Results  Component Value Date   HGBA1C 6.9 05/18/2022   HGBA1C 7.7 (H) 02/15/2022   HGBA1C 7.1 (A) 11/16/2021    Insomnia -trazodone 25 to 50 mg nightly.  She feels as though this works well for her and she is able to get a good night sleep without waking up feeling fatigued or any daytime somnolence related to hangover effect.  Hyperlipidemia- Takes Simvastatin  20 mg daily denies myalgia or fatigue Lab Results  Component Value Date   CHOL 187 02/15/2022   HDL 72.40 02/15/2022   LDLCALC 96 02/15/2022   TRIG 93.0 02/15/2022   CHOLHDL 3 02/15/2022    Osteoporosis/osteopenia -takes Fosamax 70  mg weekly and vitamin D 1000 mg daily.  Her last bone density was in May 2023 with a T score of -1.8   Lumbar radiculopathy-managed by neurology.  Currently prescribed gabapentin 600-900 mg QHS.   Some days are better than  GERD -  uses prilosec 40 mg daily and Carafate as needed  All immunizations and health maintenance protocols were reviewed with the patient and needed orders were placed.  Appropriate screening laboratory values were ordered for the patient including screening of hyperlipidemia, renal function and hepatic function.  Medication reconciliation,  past medical history, social history, problem list and allergies were reviewed in detail with the patient  Goals were established with regard to weight loss, exercise, and  diet in compliance with medications  Review of Systems  Constitutional: Negative.   HENT: Negative.    Eyes: Negative.   Respiratory: Negative.    Cardiovascular: Negative.   Gastrointestinal: Negative.   Endocrine: Negative.   Genitourinary: Negative.   Musculoskeletal: Negative.   Skin: Negative.   Allergic/Immunologic: Negative.   Neurological: Negative.   Hematological: Negative.   Psychiatric/Behavioral: Negative.     Past Medical History:  Diagnosis Date   Acid reflux    Allergic rhinitis    Allergy    Anemia    Arthritis    per MD- pt states no s/s of this    Asthma    Bilateral sciatica    Cataract    removed both eyes    Chronic bilateral low back pain    Colon polyps    Diverticulosis    DM type 2 (diabetes mellitus, type 2) (HCC)    Environmental allergies    flowers, pollen, trees   GERD (gastroesophageal reflux disease)    Hyperlipidemia    Low  back pain    Migraine    past hx- control as allergies are controlled    Osteoporosis    PONV (postoperative nausea and vomiting)     Social History   Socioeconomic History   Marital status: Married    Spouse name: Not on file   Number of children: 2   Years of education: 16   Highest education level: Bachelor's degree (e.g., BA, AB, BS)  Occupational History   Occupation: Retired  Tobacco Use   Smoking status: Never   Smokeless tobacco: Never  Vaping Use   Vaping status: Never Used  Substance  and Sexual Activity   Alcohol use: No    Alcohol/week: 0.0 standard drinks of alcohol   Drug use: No   Sexual activity: Not on file  Other Topics Concern   Not on file  Social History Narrative      Married 1 son one daughter, relocated to [place] after living in [place] for many years. 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   No caffeine.          Lives at home with her husband and brother.   Right-handed.   No caffeine use.   Social Determinants of Health   Financial Resource Strain: Low Risk  (08/31/2023)   Overall Financial Resource Strain (CARDIA)    Difficulty of Paying Living Expenses: Not very hard  Food Insecurity: No Food Insecurity (08/31/2023)   Hunger Vital Sign    Worried About Running Out of Food in the Last Year: Never true    Ran Out of Food in the Last Year: Never true  Transportation Needs: No Transportation Needs (08/31/2023)   PRAPARE - Administrator, Civil Service (Medical): No    Lack of Transportation (Non-Medical): No  Physical Activity: Insufficiently Active (08/31/2023)   Exercise Vital Sign    Days of Exercise per Week: 4 days    Minutes of Exercise per Session: 30 min  Stress: Stress Concern Present (08/31/2023)   [employer] of Occupational Health - Occupational Stress Questionnaire    Feeling of Stress : To some extent  Social Connections: Socially Integrated (08/31/2023)   Social Connection and Isolation Panel [NHANES]    Frequency of Communication with Friends and Family: More than three times a week    Frequency of Social Gatherings with Friends and Family: Once a week    Attends Religious Services: More than 4 times per year    Active Member of [employer] or Organizations: Yes    Attends Engineer, structural: More than 4 times per year    Marital Status: Married  Catering manager Violence: Not At Risk (09/16/2022)   Humiliation, Afraid, Rape, and Kick questionnaire    Fear of Current or Ex-Partner: No     Emotionally Abused: No    Physically Abused: No    Sexually Abused: No    Past Surgical History:  Procedure Laterality Date   CATARACT EXTRACTION, BILATERAL Bilateral 2012   CESAREAN SECTION     x 2   COLONOSCOPY     > 10 yrs ago- unsure if removed polyps as was told sev.different things after this colon    DENTAL SURGERY     molars removed    DILATION AND CURETTAGE OF UTERUS     NASAL SINUS SURGERY     TUBAL LIGATION      Family History  Problem Relation Age of Onset   Asthma Father    Emphysema  Father    Alcohol abuse Father    Diabetes Mellitus II Brother        x 2   Hyperlipidemia Brother    Hypertension Brother    Pancreatic cancer Brother    Prostate cancer Brother    Diabetes Brother    Heart disease Maternal Grandmother        hardening of the arteries   Heart murmur Daughter    Glaucoma Brother    Breast cancer Neg Hx    Colon cancer Neg Hx    Colon polyps Neg Hx    Esophageal cancer Neg Hx    Rectal cancer Neg Hx    Stomach cancer Neg Hx     Allergies  Allergen Reactions   Peanut-Containing Drug Products Anaphylaxis   Apple     Pt stated it " causes sinuses to swell and immflamation"    Egg-Derived Products Nausea Only    Nausea only- can eat cakes with eggs    Milk-Related Compounds Nausea And Vomiting    Milk and cheese *Can drink 1 cup a day*   Actos [Pioglitazone] Nausea Only   Soy Allergy Nausea Only    Pt states had allergy testing and was told allergic to soy-causes nausea but no other reactions   Vicodin [Hydrocodone-Acetaminophen] Nausea Only    Current Outpatient Medications on File Prior to Visit  Medication Sig Dispense Refill   Accu-Chek FastClix Lancets MISC USE TO CHECK BLOOD GLUCOSE TWICE DAILY 204 each 3   ACCU-CHEK GUIDE test strip USE AS INSTRUCTED 300 strip 3   albuterol (VENTOLIN HFA) 108 (90 Base) MCG/ACT inhaler Inhale 2 puffs into the lungs every 4 (four) hours as needed for wheezing or shortness of breath (coughing  fits). 18 g 1   Alcohol Swabs (DROPSAFE ALCOHOL PREP) 70 % PADS USE TO TEST BLOOD GLUCOSE FOUR TIMES DAILY AS DIRECTED 400 each 10   alendronate (FOSAMAX) 70 MG tablet TAKE 1 TABLET WEEKLY WITH 8 OZ OF PLAIN WATER 30 MINUTES BEFORE FIRST FOOD, DRINK OR MEDS. STAY UPRIGHT FOR 30 MINS 12 tablet 10   Ascorbic Acid (VITAMIN C) 1000 MG tablet Take 1,000 mg by mouth daily.     Azelastine-Fluticasone 137-50 MCG/ACT SUSP Place 1 spray into the nose 2 (two) times daily as needed (nasal symptoms). 69 g 1   Barberry-Oreg Grape-Goldenseal (BERBERINE COMPLEX PO) Take 1 capsule by mouth daily.     Blood Glucose Monitoring Suppl (ACCU-CHEK GUIDE) w/Device KIT 1 kit by Does not apply route as directed. 1 kit 0   Blood Glucose Monitoring Suppl DEVI 1 each by Does not apply route in the morning, at noon, and at bedtime. May substitute to any manufacturer covered by patient's insurance. 1 each 0   CINNAMON PO Take 1 tablet by mouth as needed (for blood glucose).     Cranberry 450 MG CAPS Take 450 mg by mouth daily.     Cyanocobalamin (VITAMIN B-12 CR) 1500 MCG TBCR Take 1,500 mcg by mouth daily.     Echinacea-Goldenseal (ECHINACEA COMB/GOLDEN SEAL PO) Take 1 tablet by mouth as needed (for prevention). Reported on 12/16/2015     EPINEPHrine 0.3 mg/0.3 mL IJ SOAJ injection Inject 0.3 mg into the muscle as needed for anaphylaxis. 3 each 1   fluticasone (FLONASE) 50 MCG/ACT nasal spray USE 2 SPRAYS IN EACH NOSTRIL EVERY DAY 48 g 0   gabapentin (NEURONTIN) 300 MG capsule TAKE 2 CAPSULES THREE TIMES DAILY 540 capsule 3   glipiZIDE (GLUCOTROL XL) 5  MG 24 hr tablet TAKE 1 TABLET EVERY DAY WITH BREAKFAST 90 tablet 3   Glucose Blood (BLOOD GLUCOSE TEST STRIPS) STRP 1 each by In Vitro route in the morning, at noon, and at bedtime. May substitute to any manufacturer covered by patient's insurance. 100 strip 0   Green Tea, Camillia sinensis, (GREEN TEA PO) Take 1 tablet by mouth as needed (for wellbeing). Reported on 12/16/2015      Lactobacillus (ACIDOPHILUS PROBIOTIC PO) Take 1 tablet by mouth daily.     Lancet Device MISC 1 each by Does not apply route in the morning, at noon, and at bedtime. May substitute to any manufacturer covered by patient's insurance. 1 each 0   Lancets Misc. MISC 1 each by Does not apply route in the morning, at noon, and at bedtime. May substitute to any manufacturer covered by patient's insurance. 100 each 0   levocetirizine (XYZAL) 5 MG tablet TAKE 1 TABLET EVERY EVENING 30 tablet 11   Melatonin ER 5 MG TBCR Take 5 mg by mouth at bedtime as needed (FOR SLEEP).     metFORMIN (GLUCOPHAGE-XR) 500 MG 24 hr tablet TAKE 2 TABLETS TWICE DAILY 360 tablet 3   montelukast (SINGULAIR) 10 MG tablet TAKE 1 TABLET AT BEDTIME 90 tablet 3   Omega-3 Fatty Acids (OMEGA 3 PO) Take 1 tablet by mouth as needed.     omeprazole (PRILOSEC) 40 MG capsule TAKE 1 CAPSULE EVERY DAY 90 capsule 3   Polyethyl Glycol-Propyl Glycol (SYSTANE FREE OP) Apply 1 drop to eye daily. Uses thera work for dr eyes per pt     Potassium 95 MG TABS Take 95 mg by mouth daily.     ramipril (ALTACE) 2.5 MG capsule Take 1 capsule (2.5 mg total) by mouth daily. 90 capsule 3   simvastatin (ZOCOR) 20 MG tablet TAKE 1 TABLET EVERY DAY 90 tablet 10   Stinging Nettle 2 % POWD Take 1 tablet by mouth daily at 12 noon. Reported on 12/16/2015     traZODone (DESYREL) 50 MG tablet TAKE 1/2 TO 1 TABLET AT BEDTIME AS NEEDED FOR SLEEP 90 tablet 3   TURMERIC PO Take 800 mg by mouth daily as needed (for well being). Reported on 12/16/2015     No current facility-administered medications on file prior to visit.    BP 120/60   Pulse 74   Temp 98.1 F (36.7 C) (Oral)   Ht 5' 3.75" (1.619 m)   Wt 140 lb (63.5 kg)   SpO2 96%   BMI 24.22 kg/m       Objective:   Physical Exam Vitals and nursing note reviewed.  Constitutional:      General: She is not in acute distress.    Appearance: Normal appearance. She is not ill-appearing.  HENT:     Head:  Normocephalic and atraumatic.     Right Ear: Tympanic membrane, ear canal and external ear normal. There is no impacted cerumen.     Left Ear: Tympanic membrane, ear canal and external ear normal. There is no impacted cerumen.     Nose: Nose normal. No congestion or rhinorrhea.     Mouth/Throat:     Mouth: Mucous membranes are moist.     Pharynx: Oropharynx is clear.  Eyes:     Extraocular Movements: Extraocular movements intact.     Conjunctiva/sclera: Conjunctivae normal.     Pupils: Pupils are equal, round, and reactive to light.  Neck:     Vascular: No carotid bruit.  Cardiovascular:  Rate and Rhythm: Normal rate and regular rhythm.     Pulses: Normal pulses.     Heart sounds: No murmur heard.    No friction rub. No gallop.  Pulmonary:     Effort: Pulmonary effort is normal.     Breath sounds: Normal breath sounds.  Abdominal:     General: Abdomen is flat. Bowel sounds are normal. There is no distension.     Palpations: Abdomen is soft. There is no mass.     Tenderness: There is no abdominal tenderness. There is no guarding or rebound.     Hernia: No hernia is present.  Musculoskeletal:        General: Normal range of motion.     Cervical back: Normal range of motion and neck supple.  Lymphadenopathy:     Cervical: No cervical adenopathy.  Skin:    General: Skin is warm and dry.     Capillary Refill: Capillary refill takes less than 2 seconds.  Neurological:     General: No focal deficit present.     Mental Status: She is alert and oriented to person, place, and time.  Psychiatric:        Mood and Affect: Mood normal.        Behavior: Behavior normal.        Thought Content: Thought content normal.        Judgment: Judgment normal.       Assessment & Plan:  1. Routine general medical examination at a health care facility Today patient counseled on age appropriate routine health concerns for screening and prevention, each reviewed and up to date or declined.  Immunizations reviewed and up to date or declined. Labs ordered and reviewed. Risk factors for depression reviewed and negative. Hearing function and visual acuity are intact. ADLs screened and addressed as needed. Functional ability and level of safety reviewed and appropriate. Education, counseling and referrals performed based on assessed risks today. Patient provided with a copy of personalized plan for preventive services. - Follow up in one year or sooner if needed  2. Diabetes mellitus treated with oral medication (HCC) - Consider increase in metformin  - Follow up in 6 months  - CBC with Differential/Platelet; Future - Comprehensive metabolic panel; Future - Lipid panel; Future - TSH; Future - Hemoglobin A1c; Future - Microalbumin/Creatinine Ratio, Urine; Future  3. Insomnia, unspecified type - Continue with Trazodone  - CBC with Differential/Platelet; Future - Comprehensive metabolic panel; Future - Lipid panel; Future - TSH; Future  4. Hyperlipidemia associated with type 2 diabetes mellitus (HCC) - Consider increase in statin  - CBC with Differential/Platelet; Future - Comprehensive metabolic panel; Future - Lipid panel; Future - TSH; Future  5. Osteopenia of multiple sites  - VITAMIN D 25 Hydroxy (Vit-D Deficiency, Fractures); Future - alendronate (FOSAMAX) 70 MG tablet; TAKE 1 TABLET WEEKLY WITH 8 OZ OF PLAIN WATER 30 MINUTES BEFORE FIRST FOOD, DRINK OR MEDS. STAY UPRIGHT FOR 30 MINS  Dispense: 12 tablet; Refill: 10  6. Right lumbar radiculopathy - per neurology   7. Gastroesophageal reflux disease with esophagitis without hemorrhage - Continue with Prilosec  - sucralfate (CARAFATE) 1 GM/10ML suspension; Take 10 mLs (1 g total) by mouth 4 (four) times daily -  with meals and at bedtime.  Dispense: 420 mL; Refill: 2   Shirline Frees, NP

## 2023-09-07 ENCOUNTER — Other Ambulatory Visit: Payer: Medicare HMO

## 2023-09-08 ENCOUNTER — Other Ambulatory Visit (INDEPENDENT_AMBULATORY_CARE_PROVIDER_SITE_OTHER): Payer: Medicare HMO

## 2023-09-08 DIAGNOSIS — E785 Hyperlipidemia, unspecified: Secondary | ICD-10-CM | POA: Diagnosis not present

## 2023-09-08 DIAGNOSIS — Z7984 Long term (current) use of oral hypoglycemic drugs: Secondary | ICD-10-CM

## 2023-09-08 DIAGNOSIS — E1169 Type 2 diabetes mellitus with other specified complication: Secondary | ICD-10-CM

## 2023-09-08 DIAGNOSIS — G4700 Insomnia, unspecified: Secondary | ICD-10-CM | POA: Diagnosis not present

## 2023-09-08 DIAGNOSIS — M8589 Other specified disorders of bone density and structure, multiple sites: Secondary | ICD-10-CM

## 2023-09-08 DIAGNOSIS — E119 Type 2 diabetes mellitus without complications: Secondary | ICD-10-CM

## 2023-09-08 LAB — HEMOGLOBIN A1C: Hgb A1c MFr Bld: 7.6 % — ABNORMAL HIGH (ref 4.6–6.5)

## 2023-09-08 LAB — CBC WITH DIFFERENTIAL/PLATELET
Basophils Absolute: 0 10*3/uL (ref 0.0–0.1)
Basophils Relative: 0.9 % (ref 0.0–3.0)
Eosinophils Absolute: 0.3 10*3/uL (ref 0.0–0.7)
Eosinophils Relative: 5.5 % — ABNORMAL HIGH (ref 0.0–5.0)
HCT: 39.4 % (ref 36.0–46.0)
Hemoglobin: 12.9 g/dL (ref 12.0–15.0)
Lymphocytes Relative: 26.6 % (ref 12.0–46.0)
Lymphs Abs: 1.3 10*3/uL (ref 0.7–4.0)
MCHC: 32.7 g/dL (ref 30.0–36.0)
MCV: 91.1 fl (ref 78.0–100.0)
Monocytes Absolute: 0.4 10*3/uL (ref 0.1–1.0)
Monocytes Relative: 8.1 % (ref 3.0–12.0)
Neutro Abs: 2.9 10*3/uL (ref 1.4–7.7)
Neutrophils Relative %: 58.9 % (ref 43.0–77.0)
Platelets: 226 10*3/uL (ref 150.0–400.0)
RBC: 4.33 Mil/uL (ref 3.87–5.11)
RDW: 14.2 % (ref 11.5–15.5)
WBC: 4.9 10*3/uL (ref 4.0–10.5)

## 2023-09-08 LAB — COMPREHENSIVE METABOLIC PANEL
ALT: 14 U/L (ref 0–35)
AST: 18 U/L (ref 0–37)
Albumin: 4 g/dL (ref 3.5–5.2)
Alkaline Phosphatase: 58 U/L (ref 39–117)
BUN: 12 mg/dL (ref 6–23)
CO2: 28 mEq/L (ref 19–32)
Calcium: 9.9 mg/dL (ref 8.4–10.5)
Chloride: 103 mEq/L (ref 96–112)
Creatinine, Ser: 0.85 mg/dL (ref 0.40–1.20)
GFR: 66.74 mL/min (ref >60.00)
Glucose, Bld: 111 mg/dL — ABNORMAL HIGH (ref 70–99)
Potassium: 4.6 mEq/L (ref 3.5–5.1)
Sodium: 138 mEq/L (ref 135–145)
Total Bilirubin: 0.2 mg/dL (ref 0.2–1.2)
Total Protein: 8.1 g/dL (ref 6.0–8.3)

## 2023-09-08 LAB — MICROALBUMIN / CREATININE URINE RATIO
Creatinine,U: 25.3 mg/dL
Microalb Creat Ratio: 2.8 mg/g (ref 0.0–30.0)
Microalb, Ur: 0.7 mg/dL (ref 0.0–1.9)

## 2023-09-08 LAB — LIPID PANEL
Cholesterol: 150 mg/dL (ref 0–200)
HDL: 62.1 mg/dL (ref >39.00)
LDL Cholesterol: 75 mg/dL (ref 0–99)
NonHDL: 88.04
Total CHOL/HDL Ratio: 2
Triglycerides: 65 mg/dL (ref 0.0–149.0)
VLDL: 13 mg/dL (ref 0.0–40.0)

## 2023-09-08 LAB — VITAMIN D 25 HYDROXY (VIT D DEFICIENCY, FRACTURES): VITD: 37.03 ng/mL (ref 30.00–100.00)

## 2023-09-08 LAB — TSH: TSH: 1.25 u[IU]/mL (ref 0.35–5.50)

## 2023-09-11 ENCOUNTER — Ambulatory Visit (INDEPENDENT_AMBULATORY_CARE_PROVIDER_SITE_OTHER): Payer: Medicare HMO | Admitting: *Deleted

## 2023-09-11 DIAGNOSIS — J309 Allergic rhinitis, unspecified: Secondary | ICD-10-CM | POA: Diagnosis not present

## 2023-09-12 DIAGNOSIS — H52223 Regular astigmatism, bilateral: Secondary | ICD-10-CM | POA: Diagnosis not present

## 2023-09-12 DIAGNOSIS — H5201 Hypermetropia, right eye: Secondary | ICD-10-CM | POA: Diagnosis not present

## 2023-09-12 DIAGNOSIS — E113293 Type 2 diabetes mellitus with mild nonproliferative diabetic retinopathy without macular edema, bilateral: Secondary | ICD-10-CM | POA: Diagnosis not present

## 2023-09-12 DIAGNOSIS — H35033 Hypertensive retinopathy, bilateral: Secondary | ICD-10-CM | POA: Diagnosis not present

## 2023-09-12 DIAGNOSIS — H5212 Myopia, left eye: Secondary | ICD-10-CM | POA: Diagnosis not present

## 2023-09-14 ENCOUNTER — Other Ambulatory Visit: Payer: Self-pay | Admitting: Adult Health

## 2023-09-18 ENCOUNTER — Ambulatory Visit (INDEPENDENT_AMBULATORY_CARE_PROVIDER_SITE_OTHER): Payer: Medicare HMO

## 2023-09-18 DIAGNOSIS — J309 Allergic rhinitis, unspecified: Secondary | ICD-10-CM | POA: Diagnosis not present

## 2023-09-20 ENCOUNTER — Ambulatory Visit: Payer: Medicare HMO

## 2023-09-20 VITALS — Ht 63.75 in | Wt 140.0 lb

## 2023-09-20 DIAGNOSIS — Z0000 Encounter for general adult medical examination without abnormal findings: Secondary | ICD-10-CM | POA: Diagnosis not present

## 2023-09-20 NOTE — Patient Instructions (Addendum)
Ms. Axelson , Thank you for taking time to come for your Medicare Wellness Visit. I appreciate your ongoing commitment to your health goals. Please review the following plan we discussed and let me know if I can assist you in the future.   Referrals/Orders/Follow-Ups/Clinician Recommendations:   This is a list of the screening recommended for you and due dates:  Health Maintenance  Topic Date Due   DTaP/Tdap/Td vaccine (1 - Tdap) Never done   Zoster (Shingles) Vaccine (1 of 2) Never done   Pneumonia Vaccine (2 of 2 - PPSV23 or PCV20) 01/27/2015   Eye exam for diabetics  09/04/2021   Flu Shot  Never done   COVID-19 Vaccine (5 - 2023-24 season) 08/20/2023   Hemoglobin A1C  03/07/2024   Mammogram  08/14/2024   Complete foot exam   09/05/2024   Yearly kidney function blood test for diabetes  09/07/2024   Yearly kidney health urinalysis for diabetes  09/07/2024   Medicare Annual Wellness Visit  09/19/2024   DEXA scan (bone density measurement)  Completed   Hepatitis C Screening  Completed   HPV Vaccine  Aged Out   Colon Cancer Screening  Discontinued    Advanced directives: (Declined) Advance directive discussed with you today. Even though you declined this today, please call our office should you change your mind, and we can give you the proper paperwork for you to fill out.  Next Medicare Annual Wellness Visit scheduled for next year: Yes

## 2023-09-20 NOTE — Progress Notes (Signed)
Subjective:   Kimberly Lam is a 77 y.o. female who presents for Medicare Annual (Subsequent) preventive examination.  Visit Complete: Virtual  I connected with  Kimberly Lam on 09/20/23 by a audio enabled telemedicine application and verified that I am speaking with the correct person using two identifiers.  Patient Location: Home  Provider Location: Home Office  I discussed the limitations of evaluation and management by telemedicine. The patient expressed understanding and agreed to proceed.   Because this visit was a virtual/telehealth visit, some criteria may be missing or patient reported. Any vitals not documented were not able to be obtained and vitals that have been documented are patient reported.   Cardiac Risk Factors include: advanced age (>56men, >46 women);diabetes mellitus     Objective:    Today's Vitals   09/20/23 1523  Weight: 140 lb (63.5 kg)  Height: 5' 3.75" (1.619 m)   Body mass index is 24.22 kg/m.     09/20/2023    3:31 PM 09/16/2022    3:35 PM 04/08/2022    1:28 PM 09/09/2021    3:33 PM 10/12/2020    3:33 PM 09/03/2020    2:07 PM 07/13/2020    3:51 PM  Advanced Directives  Does Patient Have a Medical Advance Directive? No No No No No No No  Does patient want to make changes to medical advance directive?   No - Patient declined  No - Patient declined    Would patient like information on creating a medical advance directive? No - Patient declined No - Patient declined  No - Patient declined  No - Patient declined No - Patient declined    Current Medications (verified) Outpatient Encounter Medications as of 09/20/2023  Medication Sig   Accu-Chek FastClix Lancets MISC USE TO CHECK BLOOD GLUCOSE TWICE DAILY   ACCU-CHEK GUIDE test strip USE AS INSTRUCTED   albuterol (VENTOLIN HFA) 108 (90 Base) MCG/ACT inhaler Inhale 2 puffs into the lungs every 4 (four) hours as needed for wheezing or shortness of breath (coughing fits).   Alcohol Swabs  (DROPSAFE ALCOHOL PREP) 70 % PADS USE TO TEST BLOOD GLUCOSE FOUR TIMES DAILY AS DIRECTED   alendronate (FOSAMAX) 70 MG tablet TAKE 1 TABLET WEEKLY WITH 8 OZ OF PLAIN WATER 30 MINUTES BEFORE FIRST FOOD, DRINK OR MEDS. STAY UPRIGHT FOR 30 MINS   Ascorbic Acid (VITAMIN C) 1000 MG tablet Take 1,000 mg by mouth daily.   Azelastine-Fluticasone 137-50 MCG/ACT SUSP Place 1 spray into the nose 2 (two) times daily as needed (nasal symptoms).   Barberry-Oreg Grape-Goldenseal (BERBERINE COMPLEX PO) Take 1 capsule by mouth daily.   Blood Glucose Monitoring Suppl (ACCU-CHEK GUIDE) w/Device KIT 1 kit by Does not apply route as directed.   Blood Glucose Monitoring Suppl DEVI 1 each by Does not apply route in the morning, at noon, and at bedtime. May substitute to any manufacturer covered by patient's insurance.   CINNAMON PO Take 1 tablet by mouth as needed (for blood glucose).   Cranberry 450 MG CAPS Take 450 mg by mouth daily.   Cyanocobalamin (VITAMIN B-12 CR) 1500 MCG TBCR Take 1,500 mcg by mouth daily.   Echinacea-Goldenseal (ECHINACEA COMB/GOLDEN SEAL PO) Take 1 tablet by mouth as needed (for prevention). Reported on 12/16/2015   EPINEPHrine 0.3 mg/0.3 mL IJ SOAJ injection Inject 0.3 mg into the muscle as needed for anaphylaxis.   fluticasone (FLONASE) 50 MCG/ACT nasal spray USE 2 SPRAYS IN EACH NOSTRIL EVERY DAY   gabapentin (NEURONTIN) 300  MG capsule TAKE 2 CAPSULES THREE TIMES DAILY   glipiZIDE (GLUCOTROL XL) 5 MG 24 hr tablet TAKE 1 TABLET EVERY DAY WITH BREAKFAST   Glucose Blood (BLOOD GLUCOSE TEST STRIPS) STRP 1 each by In Vitro route in the morning, at noon, and at bedtime. May substitute to any manufacturer covered by patient's insurance.   Green Tea, Camillia sinensis, (GREEN TEA PO) Take 1 tablet by mouth as needed (for wellbeing). Reported on 12/16/2015   Lactobacillus (ACIDOPHILUS PROBIOTIC PO) Take 1 tablet by mouth daily.   Lancets Misc. MISC 1 each by Does not apply route in the morning, at  noon, and at bedtime. May substitute to any manufacturer covered by patient's insurance.   levocetirizine (XYZAL) 5 MG tablet TAKE 1 TABLET EVERY EVENING   Melatonin ER 5 MG TBCR Take 5 mg by mouth at bedtime as needed (FOR SLEEP).   metFORMIN (GLUCOPHAGE-XR) 500 MG 24 hr tablet TAKE 2 TABLETS TWICE DAILY   montelukast (SINGULAIR) 10 MG tablet TAKE 1 TABLET AT BEDTIME   Omega-3 Fatty Acids (OMEGA 3 PO) Take 1 tablet by mouth as needed.   omeprazole (PRILOSEC) 40 MG capsule TAKE 1 CAPSULE EVERY DAY   Polyethyl Glycol-Propyl Glycol (SYSTANE FREE OP) Apply 1 drop to eye daily. Uses thera work for dr eyes per pt   Potassium 95 MG TABS Take 95 mg by mouth daily.   ramipril (ALTACE) 2.5 MG capsule Take 1 capsule (2.5 mg total) by mouth daily.   simvastatin (ZOCOR) 20 MG tablet TAKE 1 TABLET EVERY DAY   Stinging Nettle 2 % POWD Take 1 tablet by mouth daily at 12 noon. Reported on 12/16/2015   sucralfate (CARAFATE) 1 GM/10ML suspension Take 10 mLs (1 g total) by mouth 4 (four) times daily -  with meals and at bedtime.   traZODone (DESYREL) 50 MG tablet TAKE 1/2 TO 1 TABLET AT BEDTIME AS NEEDED FOR SLEEP   TURMERIC PO Take 800 mg by mouth daily as needed (for well being). Reported on 12/16/2015   No facility-administered encounter medications on file as of 09/20/2023.    Allergies (verified) Peanut-containing drug products, Apple, Egg-derived products, Milk-related compounds, Actos [pioglitazone], Soy allergy, and Vicodin [hydrocodone-acetaminophen]   History: Past Medical History:  Diagnosis Date   Acid reflux    Allergic rhinitis    Allergy    Anemia    Arthritis    per MD- pt states no s/s of this    Asthma    Bilateral sciatica    Cataract    removed both eyes    Chronic bilateral low back pain    Colon polyps    Diverticulosis    DM type 2 (diabetes mellitus, type 2) (HCC)    Environmental allergies    flowers, pollen, trees   GERD (gastroesophageal reflux disease)     Hyperlipidemia    Low back pain    Migraine    past hx- control as allergies are controlled    Osteoporosis    PONV (postoperative nausea and vomiting)    Past Surgical History:  Procedure Laterality Date   CATARACT EXTRACTION, BILATERAL Bilateral 2012   CESAREAN SECTION     x 2   COLONOSCOPY     > 10 yrs ago- unsure if removed polyps as was told sev.different things after this colon    DENTAL SURGERY     molars removed    DILATION AND CURETTAGE OF UTERUS     NASAL SINUS SURGERY     TUBAL  LIGATION     Family History  Problem Relation Age of Onset   Asthma Father    Emphysema Father    Alcohol abuse Father    Diabetes Mellitus II Brother        x 2   Hyperlipidemia Brother    Hypertension Brother    Pancreatic cancer Brother    Prostate cancer Brother    Diabetes Brother    Heart disease Maternal Grandmother        hardening of the arteries   Heart murmur Daughter    Glaucoma Brother    Breast cancer Neg Hx    Colon cancer Neg Hx    Colon polyps Neg Hx    Esophageal cancer Neg Hx    Rectal cancer Neg Hx    Stomach cancer Neg Hx    Social History   Socioeconomic History   Marital status: Married    Spouse name: Not on file   Number of children: 2   Years of education: 16   Highest education level: Bachelor's degree (e.g., BA, AB, BS)  Occupational History   Occupation: Retired  Tobacco Use   Smoking status: Never   Smokeless tobacco: Never  Vaping Use   Vaping status: Never Used  Substance and Sexual Activity   Alcohol use: No    Alcohol/week: 0.0 standard drinks of alcohol   Drug use: No   Sexual activity: Not on file  Other Topics Concern   Not on file  Social History Narrative      Married 1 son one daughter, relocated to [place] after living in [place] for many years. 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   No caffeine.          Lives at home with her husband and brother.   Right-handed.   No caffeine use.   Social  Determinants of Health   Financial Resource Strain: Low Risk  (09/20/2023)   Overall Financial Resource Strain (CARDIA)    Difficulty of Paying Living Expenses: Not hard at all  Food Insecurity: No Food Insecurity (09/20/2023)   Hunger Vital Sign    Worried About Running Out of Food in the Last Year: Never true    Ran Out of Food in the Last Year: Never true  Transportation Needs: No Transportation Needs (09/20/2023)   PRAPARE - Administrator, Civil Service (Medical): No    Lack of Transportation (Non-Medical): No  Physical Activity: Insufficiently Active (09/20/2023)   Exercise Vital Sign    Days of Exercise per Week: 4 days    Minutes of Exercise per Session: 30 min  Stress: No Stress Concern Present (09/20/2023)   [employer] of Occupational Health - Occupational Stress Questionnaire    Feeling of Stress : Not at all  Recent Concern: Stress - Stress Concern Present (08/31/2023)   [employer] of Occupational Health - Occupational Stress Questionnaire    Feeling of Stress : To some extent  Social Connections: Socially Integrated (09/20/2023)   Social Connection and Isolation Panel [NHANES]    Frequency of Communication with Friends and Family: More than three times a week    Frequency of Social Gatherings with Friends and Family: More than three times a week    Attends Religious Services: More than 4 times per year    Active Member of [employer] or Organizations: Yes    Attends Engineer, structural: More than 4 times per year    Marital Status: Married  Tobacco Counseling Counseling given: Not Answered   Clinical Intake:  Pre-visit preparation completed: Yes  Activities of Daily Living    09/20/2023    3:29 PM  In your present state of health, do you have any difficulty performing the following activities:  Hearing? 0  Vision? 0  Difficulty concentrating or making decisions? 0  Walking or climbing stairs? 0  Dressing or bathing? 0  Doing  errands, shopping? 0  Preparing Food and eating ? N  Using the Toilet? N  In the past six months, have you accidently leaked urine? N  Do you have problems with loss of bowel control? N  Managing your Medications? N  Managing your Finances? N  Housekeeping or managing your Housekeeping? N    Patient Care Team: Shirline Frees, NP as PCP - General (Family Medicine)  Indicate any recent Medical Services you may have received from other than [facility] providers in the past year (date may be approximate).     Assessment:   This is a routine wellness examination for Areyanna.  Hearing/Vision screen Hearing Screening - Comments:: Denies hearing difficulties   Vision Screening - Comments:: Wears rx glasses - up to date with routine eye exams with  [facility]   Goals Addressed               This Visit's Progress     Complete personal project (pt-stated)        Example: Complete moving.       Depression Screen    09/20/2023    3:28 PM 05/17/2023    2:18 PM 09/16/2022    3:31 PM 04/08/2022    1:28 PM 02/15/2022    1:30 PM 11/16/2021    2:30 PM 09/09/2021    3:36 PM  PHQ 2/9 Scores  PHQ - 2 Score 0  0 0 0 0 0  Exception Documentation  Patient refusal         Fall Risk    09/20/2023    3:29 PM 08/31/2023    3:03 PM 05/17/2023    2:18 PM 09/16/2022    3:35 PM 04/08/2022    1:28 PM  Fall Risk   Falls in the past year? 0 0 0 0 0  Number falls in past yr: 0  0 0 0  Injury with Fall? 0  0 0 0  Risk for fall due to : No Fall Risks   No Fall Risks No Fall Risks  Follow up Falls prevention discussed  Falls evaluation completed Falls prevention discussed Education provided;Falls prevention discussed    MEDICARE RISK AT HOME: Medicare Risk at Home Any stairs in or around the home?: No If so, are there any without handrails?: No Home free of loose throw rugs in walkways, pet beds, electrical cords, etc?: Yes Adequate lighting in your home to reduce risk of falls?: Yes Life  alert?: No Use of a cane, walker or w/c?: No Grab bars in the bathroom?: Yes Shower chair or bench in shower?: No Elevated toilet seat or a handicapped toilet?: No  TIMED UP AND GO:  Was the test performed?  No    Cognitive Function:        09/20/2023    3:31 PM 09/16/2022    3:35 PM  6CIT Screen  What Year? 0 points 0 points  What month? 0 points 0 points  What time? 0 points 0 points  Count back from 20 0 points 0 points  Months in reverse 0 points  0 points  Repeat phrase 0 points 0 points  Total Score 0 points 0 points    Immunizations Immunization History  Administered Date(s) Administered   PFIZER Comirnaty(Gray Top)Covid-19 Tri-Sucrose Vaccine 05/15/2021   PFIZER(Purple Top)SARS-COV-2 Vaccination 04/30/2020, 05/25/2020, 12/15/2020   PPD Test 05/13/2009, 05/27/2009, 05/06/2011   Pneumococcal Conjugate-13 01/27/2014    TDAP status: Due, Education has been provided regarding the importance of this vaccine. Advised may receive this vaccine at local pharmacy or Health Dept. Aware to provide a copy of the vaccination record if obtained from local pharmacy or Health Dept. Verbalized acceptance and understanding.  Flu Vaccine status: Due, Education has been provided regarding the importance of this vaccine. Advised may receive this vaccine at local pharmacy or Health Dept. Aware to provide a copy of the vaccination record if obtained from local pharmacy or Health Dept. Verbalized acceptance and understanding.  Pneumococcal vaccine status: Due, Education has been provided regarding the importance of this vaccine. Advised may receive this vaccine at local pharmacy or Health Dept. Aware to provide a copy of the vaccination record if obtained from local pharmacy or Health Dept. Verbalized acceptance and understanding.  Covid-19 vaccine status: Declined, Education has been provided regarding the importance of this vaccine but patient still declined. Advised may receive this vaccine  at local pharmacy or Health Dept.or vaccine clinic. Aware to provide a copy of the vaccination record if obtained from local pharmacy or Health Dept. Verbalized acceptance and understanding.  Qualifies for Shingles Vaccine? Yes   Zostavax completed No   Shingrix Completed?: No.    Education has been provided regarding the importance of this vaccine. Patient has been advised to call insurance company to determine out of pocket expense if they have not yet received this vaccine. Advised may also receive vaccine at local pharmacy or Health Dept. Verbalized acceptance and understanding.   Screening Tests Health Maintenance  Topic Date Due   DTaP/Tdap/Td (1 - Tdap) Never done   Zoster Vaccines- Shingrix (1 of 2) Never done   Pneumonia Vaccine 35+ Years old (2 of 2 - PPSV23 or PCV20) 01/27/2015   OPHTHALMOLOGY EXAM  09/04/2021   INFLUENZA VACCINE  Never done   COVID-19 Vaccine (5 - 2023-24 season) 08/20/2023   HEMOGLOBIN A1C  03/07/2024   MAMMOGRAM  08/14/2024   FOOT EXAM  09/05/2024   Diabetic kidney evaluation - eGFR measurement  09/07/2024   Diabetic kidney evaluation - Urine ACR  09/07/2024   Medicare Annual Wellness (AWV)  09/19/2024   DEXA SCAN  Completed   Hepatitis C Screening  Completed   HPV VACCINES  Aged Out   Colonoscopy  Discontinued    Health Maintenance  Health Maintenance Due  Topic Date Due   DTaP/Tdap/Td (1 - Tdap) Never done   Zoster Vaccines- Shingrix (1 of 2) Never done   Pneumonia Vaccine 31+ Years old (2 of 2 - PPSV23 or PCV20) 01/27/2015   OPHTHALMOLOGY EXAM  09/04/2021   INFLUENZA VACCINE  Never done   COVID-19 Vaccine (5 - 2023-24 season) 08/20/2023      Mammogram status: Completed 08/15/23. Repeat every year  Bone Density status: Completed 08/08/22. Results reflect: Bone density results: OSTEOPENIA. Repeat every   years.    Additional Screening:  Hepatitis C Screening: does qualify; Completed 10/18/18  Vision Screening: Recommended annual  ophthalmology exams for early detection of glaucoma and other disorders of the eye. Is the patient up to date with their annual eye exam?  Yes  Who is the provider or what  is the name of the office in which the patient attends annual eye exams? [facility] If pt is not established with a provider, would they like to be referred to a provider to establish care? No .   Dental Screening: Recommended annual dental exams for proper oral hygiene  Diabetic Foot Exam: Diabetic Foot Exam: Completed 09/06/23  Community Resource Referral / Chronic Care Management:  CRR required this visit?  No   CCM required this visit?  No     Plan:     I have personally reviewed and noted the following in the patient's chart:   Medical and social history Use of alcohol, tobacco or illicit drugs  Current medications and supplements including opioid prescriptions. Patient is not currently taking opioid prescriptions. Functional ability and status Nutritional status Physical activity Advanced directives List of other physicians Hospitalizations, surgeries, and ER visits in previous 12 months Vitals Screenings to include cognitive, depression, and falls Referrals and appointments  In addition, I have reviewed and discussed with patient certain preventive protocols, quality metrics, and best practice recommendations. A written personalized care plan for preventive services as well as general preventive health recommendations were provided to patient.     Tillie Rung, LPN   16/12/958   After Visit Summary: (MyChart) Due to this being a telephonic visit, the after visit summary with patients personalized plan was offered to patient via MyChart   Nurse Notes: None

## 2023-09-24 NOTE — Progress Notes (Unsigned)
Follow Up Note  RE: Kimberly Lam MRN: [id number] DOB: 08-Apr-1946 Date of Office Visit: 09/25/2023  Referring provider: Shirline Frees, NP Primary care provider: Shirline Frees, NP  Chief Complaint: No chief complaint on file.  History of Present Illness: I had the pleasure of seeing Nyja Westbrook for a [facility]  on 09/24/2023. She is a 77 y.o. female, who is being followed for asthma, allergic rhinoconjunctivitis on AIT, septal perforation, GERD, food allergy, dizziness. Her previous allergy office visit was on 06/12/2023 with Dr. Selena Batten. Today is a regular follow up visit.  Discussed the use of AI scribe software for clinical note transcription with the patient, who gave verbal consent to proceed.  History of Present Illness            Moderate persistent asthma, uncomplicated Past history - Felt better with [facility] but could not afford to purchase after samples ran out. Reports that inhalers cause her mouth to get sores and become white. Interim history - using albuterol once per week. Airsupra not covered. Today's spirometry showed moderate obstruction - denies symptoms besides nasal congestion.  Daily controller medication(s): Continue Singulair (montelukast) 10 mg daily at night. May use albuterol rescue inhaler 2 puffs or nebulizer every 4 to 6 hours as needed for shortness of breath, chest tightness, coughing, and wheezing. May use albuterol rescue inhaler 2 puffs 5 to 15 minutes prior to strenuous physical activities. Monitor frequency of use - if you need to use it more than twice per week on a consistent basis let us know.    Perennial and seasonal allergic rhinoconjunctivitis Past history - started AIT on 10/27/2015 (TREE/GRASS-WEED-MITE-CAT-DOG/MOLD-CR) every 2 weeks now unable to go to every 3-4 weeks. [name] not covered. Interim history - nasal congestion unchanged. Doing well with AIT.  Continue environmental control  measures. Continue allergy injections every 2 weeks.  Continue Singulair (montelukast) 10mg  daily at night. Use Flonase (fluticasone) nasal spray 1 spray per nostril twice a day as needed for nasal congestion.  Use azelastine nasal spray 1-2 sprays per nostril twice a day as needed for runny nose/drainage. Use over the counter antihistamines such as Zyrtec (cetirizine), Claritin (loratadine), Allegra (fexofenadine), or Xyzal (levocetirizine) daily as needed. May switch antihistamines every few months. Nasal saline spray (i.e., Simply Saline) or nasal saline lavage (i.e., NeilMed) is recommended as needed and prior to medicated nasal sprays.   Nasal septal perforation Past history - Saw ENT in the past (no intervention). Interim history - unchanged. Follow up with ENT regarding the sinus surgery.   Gastroesophageal reflux disease Stable. Continue dietary and lifestyle modifications. Continue omeprazole 40 mg once a day to control reflux.   Anaphylactic reaction due to food, subsequent encounter Past history - soy caused increased rhinitis symptoms. Interim history - sometimes eats peanuts/milk which causes nasal congestion. Can't afford Epipen.  Continue to avoid peanuts, milk, and soy.   For mild symptoms you can take over the counter antihistamines such as Benadryl and monitor symptoms closely. If symptoms worsen or if you have severe symptoms including breathing issues, throat closure, significant swelling, whole body hives, severe diarrhea and vomiting, lightheadedness then seek immediate medical care.   Dizziness C/o postural dizziness. Vitals normal today. Follow up with PCP regarding the dizziness.  Assessment and Plan: Jeanne is a 77 y.o. female with: *** Assessment and Plan              No follow-ups on file.  No orders  of the defined types were placed in this encounter.  Lab Orders  No laboratory test(s) ordered today    Diagnostics: Spirometry:  Tracings  reviewed. Her effort: {Blank single:19197::"Good reproducible efforts.","It was hard to get consistent efforts and there is a question as to whether this reflects a maximal maneuver.","Poor effort, data can not be interpreted."} FVC: ***L FEV1: ***L, ***% predicted FEV1/FVC ratio: ***% Interpretation: {Blank single:19197::"Spirometry consistent with mild obstructive disease","Spirometry consistent with moderate obstructive disease","Spirometry consistent with severe obstructive disease","Spirometry consistent with possible restrictive disease","Spirometry consistent with mixed obstructive and restrictive disease","Spirometry uninterpretable due to technique","Spirometry consistent with normal pattern","No overt abnormalities noted given today's efforts"}.  Please see scanned spirometry results for details.  Skin Testing: {Blank single:19197::"Select foods","Environmental allergy panel","Environmental allergy panel and select foods","Food allergy panel","None","Deferred due to recent antihistamines use"}. *** Results discussed with patient/family.   Medication List:  Current Outpatient Medications  Medication Sig Dispense Refill   Accu-Chek FastClix Lancets MISC USE TO CHECK BLOOD GLUCOSE TWICE DAILY 204 each 3   ACCU-CHEK GUIDE test strip USE AS INSTRUCTED 300 strip 3   albuterol (VENTOLIN HFA) 108 (90 Base) MCG/ACT inhaler Inhale 2 puffs into the lungs every 4 (four) hours as needed for wheezing or shortness of breath (coughing fits). 18 g 1   Alcohol Swabs (DROPSAFE ALCOHOL PREP) 70 % PADS USE TO TEST BLOOD GLUCOSE FOUR TIMES DAILY AS DIRECTED 400 each 10   alendronate (FOSAMAX) 70 MG tablet TAKE 1 TABLET WEEKLY WITH 8 OZ OF PLAIN WATER 30 MINUTES BEFORE FIRST FOOD, DRINK OR MEDS. STAY UPRIGHT FOR 30 MINS 12 tablet 10   Ascorbic Acid (VITAMIN C) 1000 MG tablet Take 1,000 mg by mouth daily.     Azelastine-Fluticasone 137-50 MCG/ACT SUSP Place 1 spray into the nose 2 (two) times daily as needed  (nasal symptoms). 69 g 1   Barberry-Oreg Grape-Goldenseal (BERBERINE COMPLEX PO) Take 1 capsule by mouth daily.     Blood Glucose Monitoring Suppl (ACCU-CHEK GUIDE) w/Device KIT 1 kit by Does not apply route as directed. 1 kit 0   Blood Glucose Monitoring Suppl DEVI 1 each by Does not apply route in the morning, at noon, and at bedtime. May substitute to any manufacturer covered by patient's insurance. 1 each 0   CINNAMON PO Take 1 tablet by mouth as needed (for blood glucose).     Cranberry 450 MG CAPS Take 450 mg by mouth daily.     Cyanocobalamin (VITAMIN B-12 CR) 1500 MCG TBCR Take 1,500 mcg by mouth daily.     Echinacea-Goldenseal (ECHINACEA COMB/GOLDEN SEAL PO) Take 1 tablet by mouth as needed (for prevention). Reported on 12/16/2015     EPINEPHrine 0.3 mg/0.3 mL IJ SOAJ injection Inject 0.3 mg into the muscle as needed for anaphylaxis. 3 each 1   fluticasone (FLONASE) 50 MCG/ACT nasal spray USE 2 SPRAYS IN EACH NOSTRIL EVERY DAY 48 g 0   gabapentin (NEURONTIN) 300 MG capsule TAKE 2 CAPSULES THREE TIMES DAILY 540 capsule 3   glipiZIDE (GLUCOTROL XL) 5 MG 24 hr tablet TAKE 1 TABLET EVERY DAY WITH BREAKFAST 90 tablet 3   Glucose Blood (BLOOD GLUCOSE TEST STRIPS) STRP 1 each by In Vitro route in the morning, at noon, and at bedtime. May substitute to any manufacturer covered by patient's insurance. 100 strip 0   Green Tea, Camillia sinensis, (GREEN TEA PO) Take 1 tablet by mouth as needed (for wellbeing). Reported on 12/16/2015     Lactobacillus (ACIDOPHILUS PROBIOTIC PO) Take 1 tablet by mouth  daily.     Lancets Misc. MISC 1 each by Does not apply route in the morning, at noon, and at bedtime. May substitute to any manufacturer covered by patient's insurance. 100 each 0   levocetirizine (XYZAL) 5 MG tablet TAKE 1 TABLET EVERY EVENING 30 tablet 11   Melatonin ER 5 MG TBCR Take 5 mg by mouth at bedtime as needed (FOR SLEEP).     metFORMIN (GLUCOPHAGE-XR) 500 MG 24 hr tablet TAKE 2 TABLETS TWICE  DAILY 360 tablet 3   montelukast (SINGULAIR) 10 MG tablet TAKE 1 TABLET AT BEDTIME 90 tablet 3   Omega-3 Fatty Acids (OMEGA 3 PO) Take 1 tablet by mouth as needed.     omeprazole (PRILOSEC) 40 MG capsule TAKE 1 CAPSULE EVERY DAY 90 capsule 3   Polyethyl Glycol-Propyl Glycol (SYSTANE FREE OP) Apply 1 drop to eye daily. Uses thera work for dr eyes per pt     Potassium 95 MG TABS Take 95 mg by mouth daily.     ramipril (ALTACE) 2.5 MG capsule Take 1 capsule (2.5 mg total) by mouth daily. 90 capsule 3   simvastatin (ZOCOR) 20 MG tablet TAKE 1 TABLET EVERY DAY 90 tablet 10   Stinging Nettle 2 % POWD Take 1 tablet by mouth daily at 12 noon. Reported on 12/16/2015     sucralfate (CARAFATE) 1 GM/10ML suspension Take 10 mLs (1 g total) by mouth 4 (four) times daily -  with meals and at bedtime. 420 mL 2   traZODone (DESYREL) 50 MG tablet TAKE 1/2 TO 1 TABLET AT BEDTIME AS NEEDED FOR SLEEP 90 tablet 3   TURMERIC PO Take 800 mg by mouth daily as needed (for well being). Reported on 12/16/2015     No current facility-administered medications for this visit.   Allergies: Allergies  Allergen Reactions   Peanut-Containing Drug Products Anaphylaxis   Apple     Pt stated it " causes sinuses to swell and immflamation"    Egg-Derived Products Nausea Only    Nausea only- can eat cakes with eggs    Milk-Related Compounds Nausea And Vomiting    Milk and cheese *Can drink 1 cup a day*   Actos [Pioglitazone] Nausea Only   Soy Allergy Nausea Only    Pt states had allergy testing and was told allergic to soy-causes nausea but no other reactions   Vicodin [Hydrocodone-Acetaminophen] Nausea Only   I reviewed her past medical history, social history, family history, and environmental history and no significant changes have been reported from her previous visit.  Review of Systems  Constitutional:  Negative for appetite change, chills, fever and unexpected weight change.  HENT:  Positive for congestion.  Negative for rhinorrhea.   Eyes:  Negative for itching.  Respiratory:  Negative for cough, chest tightness, shortness of breath and wheezing.   Gastrointestinal:  Negative for abdominal pain.  Skin:  Negative for rash.  Allergic/Immunologic: Positive for environmental allergies and food allergies.  Neurological:  Positive for dizziness. Negative for headaches.    Objective: There were no vitals taken for this visit. There is no height or weight on file to calculate BMI. Physical Exam Vitals and nursing note reviewed.  Constitutional:      Appearance: Normal appearance. She is well-developed.  HENT:     Head: Normocephalic and atraumatic.     Right Ear: Tympanic membrane and external ear normal.     Left Ear: Tympanic membrane and external ear normal.     Nose:  Comments: Perforated nasal septum noted.    Mouth/Throat:     Mouth: Mucous membranes are moist.     Pharynx: Oropharynx is clear.  Eyes:     Conjunctiva/sclera: Conjunctivae normal.  Cardiovascular:     Rate and Rhythm: Normal rate and regular rhythm.     Heart sounds: Normal heart sounds. No murmur heard. Pulmonary:     Effort: Pulmonary effort is normal.     Breath sounds: Normal breath sounds. No wheezing, rhonchi or rales.  Musculoskeletal:     Cervical back: Neck supple.  Skin:    General: Skin is warm.     Findings: No rash.  Neurological:     Mental Status: She is alert and oriented to person, place, and time.  Psychiatric:        Behavior: Behavior normal.    Previous notes and tests were reviewed. The plan was reviewed with the patient/family, and all questions/concerned were addressed.  It was my pleasure to see Ciara today and participate in her care. Please feel free to contact me with any questions or concerns.  Sincerely,  Wyline Mood, DO Allergy & Immunology  [facility] office: [phone] [facility] office: [phone]

## 2023-09-25 ENCOUNTER — Encounter: Payer: Self-pay | Admitting: Allergy

## 2023-09-25 ENCOUNTER — Ambulatory Visit: Payer: Medicare HMO | Admitting: Allergy

## 2023-09-25 VITALS — BP 128/50 | HR 70 | Temp 98.2°F | Resp 16 | Wt 141.8 lb

## 2023-09-25 DIAGNOSIS — J4540 Moderate persistent asthma, uncomplicated: Secondary | ICD-10-CM

## 2023-09-25 DIAGNOSIS — J3089 Other allergic rhinitis: Secondary | ICD-10-CM | POA: Diagnosis not present

## 2023-09-25 DIAGNOSIS — J301 Allergic rhinitis due to pollen: Secondary | ICD-10-CM

## 2023-09-25 DIAGNOSIS — J3489 Other specified disorders of nose and nasal sinuses: Secondary | ICD-10-CM | POA: Diagnosis not present

## 2023-09-25 DIAGNOSIS — H1013 Acute atopic conjunctivitis, bilateral: Secondary | ICD-10-CM | POA: Diagnosis not present

## 2023-09-25 DIAGNOSIS — K219 Gastro-esophageal reflux disease without esophagitis: Secondary | ICD-10-CM

## 2023-09-25 DIAGNOSIS — T781XXD Other adverse food reactions, not elsewhere classified, subsequent encounter: Secondary | ICD-10-CM

## 2023-09-25 DIAGNOSIS — J309 Allergic rhinitis, unspecified: Secondary | ICD-10-CM

## 2023-09-25 DIAGNOSIS — J3081 Allergic rhinitis due to animal (cat) (dog) hair and dander: Secondary | ICD-10-CM

## 2023-09-25 NOTE — Patient Instructions (Addendum)
Asthma Daily controller medication(s): Continue Singulair (montelukast) 10 mg daily at night. May use albuterol rescue inhaler 2 puffs or nebulizer every 4 to 6 hours as needed for shortness of breath, chest tightness, coughing, and wheezing. May use albuterol rescue inhaler 2 puffs 5 to 15 minutes prior to strenuous physical activities. Monitor frequency of use - if you need to use it more than twice per week on a consistent basis let us know.  Asthma control goals:  Full participation in all desired activities (may need albuterol before activity) Albuterol use two times or less a week on average (not counting use with activity) Cough interfering with sleep two times or less a month Oral steroids no more than once a year No hospitalizations   Environmental allergies Continue environmental control measures. Get bloodwork to see if your allergies changed.  Continue allergy injections every 2 weeks.  Continue Singulair (montelukast) 10mg  daily at night. Use Flonase (fluticasone) nasal spray 1 spray per nostril twice a day as needed for nasal congestion.  Use azelastine nasal spray 1-2 sprays per nostril twice a day as needed for runny nose/drainage. Use over the counter antihistamines such as Zyrtec (cetirizine), Claritin (loratadine), Allegra (fexofenadine), or Xyzal (levocetirizine) daily as needed. May switch antihistamines every few months. Nasal saline spray (i.e., Simply Saline) or nasal saline lavage (i.e., NeilMed) is recommended as needed and prior to medicated nasal sprays.  Septal perforation/nasal congestion Follow up with ENT regarding the sinus surgery.  Reflux Continue dietary and lifestyle modifications. Continue omeprazole 40 mg once a day to control reflux.  Food allergy Continue to avoid peanuts, milk, caffeine and soy.   Get bloodwork.  For mild symptoms you can take over the counter antihistamines such as Benadryl and monitor symptoms closely. If symptoms worsen or  if you have severe symptoms including breathing issues, throat closure, significant swelling, whole body hives, severe diarrhea and vomiting, lightheadedness then seek immediate medical care.  Follow up in 4 months or sooner if needed.

## 2023-09-26 ENCOUNTER — Ambulatory Visit (INDEPENDENT_AMBULATORY_CARE_PROVIDER_SITE_OTHER): Payer: Medicare HMO | Admitting: Podiatry

## 2023-09-26 DIAGNOSIS — M79675 Pain in left toe(s): Secondary | ICD-10-CM

## 2023-09-26 DIAGNOSIS — E119 Type 2 diabetes mellitus without complications: Secondary | ICD-10-CM | POA: Diagnosis not present

## 2023-09-26 DIAGNOSIS — M79674 Pain in right toe(s): Secondary | ICD-10-CM | POA: Diagnosis not present

## 2023-09-26 DIAGNOSIS — B351 Tinea unguium: Secondary | ICD-10-CM

## 2023-09-26 DIAGNOSIS — G6289 Other specified polyneuropathies: Secondary | ICD-10-CM

## 2023-09-29 LAB — ALLERGENS W/TOTAL IGE AREA 2
Alternaria Alternata IgE: <0.1 kU/L
Aspergillus Fumigatus IgE: <0.1 kU/L
Bermuda Grass IgE: <0.1 kU/L
Cat Dander IgE: <0.1 kU/L
Cedar, Mountain IgE: <0.1 kU/L
Cladosporium Herbarum IgE: <0.1 kU/L
Cockroach, German IgE: <0.1 kU/L
Common Silver Birch IgE: <0.1 kU/L
Cottonwood IgE: <0.1 kU/L
D Farinae IgE: <0.1 kU/L
D Pteronyssinus IgE: <0.1 kU/L
Dog Dander IgE: <0.1 kU/L
Elm, American IgE: 0.11 kU/L — AB
IgE (Immunoglobulin E), Serum: 155 [IU]/mL (ref 6–495)
Johnson Grass IgE: <0.1 kU/L
Maple/Box Elder IgE: <0.1 kU/L
Mouse Urine IgE: <0.1 kU/L
Oak, White IgE: <0.1 kU/L
Pecan, Hickory IgE: <0.1 kU/L
Penicillium Chrysogen IgE: <0.1 kU/L
Pigweed, Rough IgE: <0.1 kU/L
Ragweed, Short IgE: 0.18 kU/L — AB
Sheep Sorrel IgE Qn: <0.1 kU/L
Timothy Grass IgE: <0.1 kU/L
White Mulberry IgE: <0.1 kU/L

## 2023-09-29 LAB — PEANUT COMPONENTS
F352-IgE Ara h 8: <0.1 kU/L
F422-IgE Ara h 1: <0.1 kU/L
F423-IgE Ara h 2: <0.1 kU/L
F424-IgE Ara h 3: <0.1 kU/L
F427-IgE Ara h 9: <0.1 kU/L
F447-IgE Ara h 6: <0.1 kU/L

## 2023-09-29 LAB — ALLERGEN, COFFEE, RF221: Coffee: <0.1 kU/L

## 2023-09-29 LAB — IGE PEANUT W/COMPONENT REFLEX: Peanut, IgE: 0.82 kU/L — AB

## 2023-09-29 LAB — ALLERGEN COMPONENT COMMENTS

## 2023-09-29 LAB — ALLERGEN MILK: Milk IgE: 0.4 kU/L — AB

## 2023-09-29 LAB — ALLERGEN SOYBEAN: Soybean IgE: <0.1 kU/L

## 2023-10-01 ENCOUNTER — Encounter: Payer: Self-pay | Admitting: Podiatry

## 2023-10-01 NOTE — Progress Notes (Signed)
  Subjective:  Patient ID: Kimberly Lam, female    DOB: May 23, 1946,  MRN: [id number]  77 y.o. female presents at risk foot care. Patient has history of neuropathy, diabetes and painful thick toenails that are difficult to trim. Pain interferes with ambulation. Aggravating factors include wearing enclosed shoe gear. Pain is relieved with periodic professional debridement.  Chief Complaint  Patient presents with   Diabetes    BS-125 A1C-7.6 PCPV-09-2023    New problem(s): None   PCP is Nafziger, Kandee Keen, NP.  Allergies  Allergen Reactions   Peanut-Containing Drug Products Anaphylaxis   Apple     Pt stated it " causes sinuses to swell and immflamation"    Egg-Derived Products Nausea Only    Nausea only- can eat cakes with eggs    Milk-Related Compounds Nausea And Vomiting    Milk and cheese *Can drink 1 cup a day*   Actos [Pioglitazone] Nausea Only   Soy Allergy Nausea Only    Pt states had allergy testing and was told allergic to soy-causes nausea but no other reactions   Vicodin [Hydrocodone-Acetaminophen] Nausea Only    Review of Systems: Negative except as noted in the HPI.   Objective:  Kimberly Lam is a pleasant 77 y.o. female in NAD.Marland Kitchen AAO x 3.  Vascular Examination: Vascular status intact b/l with palpable pedal pulses. CFT immediate b/l. Pedal hair present. No edema. No pain with calf compression b/l. Skin temperature gradient WNL b/l. No varicosities noted. No cyanosis or clubbing noted.  Neurological Examination: Pt has subjective symptoms of neuropathy. Sensation grossly intact b/l with 10 gram monofilament. Vibratory sensation intact b/l.  Dermatological Examination: Pedal skin with normal turgor, texture and tone b/l. No open wounds nor interdigital macerations noted. Toenails 1-5 b/l thick, discolored, elongated with subungual debris and pain on dorsal palpation.   Minimal HKT submet head 5 b/l.   Musculoskeletal Examination: Muscle strength 5/5  to b/l LE.  No pain, crepitus noted b/l. No gross pedal deformities. Patient ambulates independently without assistive aids.   Radiographs: None  Last A1c:      Latest Ref Rng & Units 09/08/2023    1:05 PM  Hemoglobin A1C  Hemoglobin-A1c 4.6 - 6.5 % 7.6      Assessment:   1. Pain due to onychomycosis of toenails of both feet   2. Other polyneuropathy   3. Diabetes mellitus without complication (HCC)    Plan:  -Consent given for treatment as described below: -Examined patient. -Continue foot and shoe inspections daily. Monitor blood glucose per PCP/Endocrinologist's recommendations. -Continue supportive shoe gear daily. -Mycotic toenails 1-5 bilaterally were debrided in length and girth with sterile nail nippers and dremel without incident. -Patient/POA to call should there be question/concern in the interim.  Return in about 3 months (around 12/27/2023).  Freddie Breech, DPM

## 2023-10-03 ENCOUNTER — Encounter: Payer: Self-pay | Admitting: Adult Health

## 2023-10-04 ENCOUNTER — Ambulatory Visit (INDEPENDENT_AMBULATORY_CARE_PROVIDER_SITE_OTHER): Payer: Self-pay | Admitting: *Deleted

## 2023-10-04 DIAGNOSIS — J309 Allergic rhinitis, unspecified: Secondary | ICD-10-CM

## 2023-10-18 ENCOUNTER — Encounter: Payer: Self-pay | Admitting: Adult Health

## 2023-10-18 DIAGNOSIS — E119 Type 2 diabetes mellitus without complications: Secondary | ICD-10-CM

## 2023-10-19 MED ORDER — RAMIPRIL 2.5 MG PO CAPS: 2.5000 mg | ORAL_CAPSULE | Freq: Every day | ORAL | 3 refills | Status: DC

## 2023-10-25 ENCOUNTER — Ambulatory Visit (INDEPENDENT_AMBULATORY_CARE_PROVIDER_SITE_OTHER): Payer: Medicare HMO | Admitting: *Deleted

## 2023-10-25 DIAGNOSIS — J309 Allergic rhinitis, unspecified: Secondary | ICD-10-CM

## 2023-10-31 ENCOUNTER — Ambulatory Visit (INDEPENDENT_AMBULATORY_CARE_PROVIDER_SITE_OTHER): Payer: Medicare HMO | Admitting: *Deleted

## 2023-10-31 DIAGNOSIS — J309 Allergic rhinitis, unspecified: Secondary | ICD-10-CM

## 2023-10-31 MED ORDER — METFORMIN HCL ER 500 MG PO TB24: 1000.0000 mg | ORAL_TABLET | Freq: Two times a day (BID) | ORAL | 3 refills | Status: DC

## 2023-10-31 NOTE — Telephone Encounter (Signed)
This has been taking care of via phone.

## 2023-11-14 ENCOUNTER — Ambulatory Visit (INDEPENDENT_AMBULATORY_CARE_PROVIDER_SITE_OTHER): Payer: Medicare HMO

## 2023-11-14 DIAGNOSIS — J309 Allergic rhinitis, unspecified: Secondary | ICD-10-CM | POA: Diagnosis not present

## 2023-11-30 ENCOUNTER — Ambulatory Visit (INDEPENDENT_AMBULATORY_CARE_PROVIDER_SITE_OTHER): Payer: Medicare HMO | Admitting: *Deleted

## 2023-11-30 DIAGNOSIS — J309 Allergic rhinitis, unspecified: Secondary | ICD-10-CM

## 2023-12-02 DIAGNOSIS — H524 Presbyopia: Secondary | ICD-10-CM | POA: Diagnosis not present

## 2023-12-04 DIAGNOSIS — J3081 Allergic rhinitis due to animal (cat) (dog) hair and dander: Secondary | ICD-10-CM | POA: Diagnosis not present

## 2023-12-05 ENCOUNTER — Telehealth: Payer: Self-pay | Admitting: Neurology

## 2023-12-05 DIAGNOSIS — J301 Allergic rhinitis due to pollen: Secondary | ICD-10-CM | POA: Diagnosis not present

## 2023-12-05 NOTE — Telephone Encounter (Signed)
Patient called reporting worsening tremor in the right hand only. She is unable to write or croche. She has a hard time holding smaller things. Had gotten worse over the last few weeks. She reports she is not currently taking anything for the tremors. Advised I would send to Dr. Terrace Arabia for review. Patient appreciative of call

## 2023-12-05 NOTE — Progress Notes (Signed)
VIALS EXP 12-04-24

## 2023-12-05 NOTE — Telephone Encounter (Signed)
Pt called wanting to speak to the RN or MD regarding her tremors getting worse especially in the R hand. Please advise.

## 2023-12-05 NOTE — Telephone Encounter (Signed)
Previously seen for low back pain, radiating pain to bilateral lower extremity, not for tremor, most recent evaluation was in June 2024  May add her to my or nurse practitioners clinic to address her tremor

## 2023-12-05 NOTE — Telephone Encounter (Signed)
Pt has been called, she accepted 03-27 with wait list to see Dr Terrace Arabia

## 2023-12-06 ENCOUNTER — Encounter: Payer: Self-pay | Admitting: Adult Health

## 2023-12-06 DIAGNOSIS — J302 Other seasonal allergic rhinitis: Secondary | ICD-10-CM

## 2023-12-15 ENCOUNTER — Other Ambulatory Visit: Payer: Self-pay | Admitting: Adult Health

## 2023-12-15 DIAGNOSIS — G4700 Insomnia, unspecified: Secondary | ICD-10-CM

## 2023-12-18 DIAGNOSIS — H903 Sensorineural hearing loss, bilateral: Secondary | ICD-10-CM | POA: Diagnosis not present

## 2023-12-19 ENCOUNTER — Ambulatory Visit (INDEPENDENT_AMBULATORY_CARE_PROVIDER_SITE_OTHER): Payer: Medicare HMO | Admitting: *Deleted

## 2023-12-19 DIAGNOSIS — J309 Allergic rhinitis, unspecified: Secondary | ICD-10-CM

## 2024-01-03 ENCOUNTER — Telehealth: Payer: Self-pay | Admitting: Adult Health

## 2024-01-03 MED ORDER — METFORMIN HCL ER 500 MG PO TB24: 1000.0000 mg | ORAL_TABLET | Freq: Two times a day (BID) | ORAL | 3 refills | Status: DC

## 2024-01-03 NOTE — Telephone Encounter (Signed)
Prescription sent to pharmacy. No other action needed.  

## 2024-01-03 NOTE — Telephone Encounter (Signed)
 Patient came in requesting a refill on :   metFORMIN  (GLUCOPHAGE -XR) 500 MG 24 hr tablet   [id number] - [place] - [address] AT [facility] [address] [facility] [address] Phone: [phone]  Fax: [phone]     Please advise at [phone]

## 2024-01-08 ENCOUNTER — Ambulatory Visit (INDEPENDENT_AMBULATORY_CARE_PROVIDER_SITE_OTHER): Payer: Medicare Other

## 2024-01-08 DIAGNOSIS — J309 Allergic rhinitis, unspecified: Secondary | ICD-10-CM | POA: Diagnosis not present

## 2024-01-09 ENCOUNTER — Encounter: Payer: Self-pay | Admitting: Adult Health

## 2024-01-09 ENCOUNTER — Ambulatory Visit (INDEPENDENT_AMBULATORY_CARE_PROVIDER_SITE_OTHER): Payer: Medicare Other | Admitting: Adult Health

## 2024-01-09 VITALS — BP 130/62 | HR 69 | Temp 98.2°F | Ht 63.75 in | Wt 137.0 lb

## 2024-01-09 DIAGNOSIS — R42 Dizziness and giddiness: Secondary | ICD-10-CM | POA: Diagnosis not present

## 2024-01-09 DIAGNOSIS — E119 Type 2 diabetes mellitus without complications: Secondary | ICD-10-CM

## 2024-01-09 DIAGNOSIS — Z7984 Long term (current) use of oral hypoglycemic drugs: Secondary | ICD-10-CM | POA: Diagnosis not present

## 2024-01-09 LAB — POCT GLYCOSYLATED HEMOGLOBIN (HGB A1C): Hemoglobin A1C: 7.2 % — AB (ref 4.0–5.6)

## 2024-01-09 MED ORDER — GLIPIZIDE ER 10 MG PO TB24: 10.0000 mg | ORAL_TABLET | Freq: Every day | ORAL | 0 refills | Status: DC

## 2024-01-09 MED ORDER — MECLIZINE HCL 25 MG PO TABS: 25.0000 mg | ORAL_TABLET | Freq: Three times a day (TID) | ORAL | 0 refills | Status: AC | PRN

## 2024-01-09 MED ORDER — METFORMIN HCL ER 500 MG PO TB24: 1000.0000 mg | ORAL_TABLET | Freq: Two times a day (BID) | ORAL | 0 refills | Status: DC

## 2024-01-09 NOTE — Progress Notes (Signed)
Subjective:    Patient ID: Kimberly Lam, female    DOB: [birth year], 78 y.o.   MRN: [id number]  HPI 78 year old female who  has a past medical history of Acid reflux, Allergic rhinitis, Allergy, Anemia, Arthritis, Asthma, Bilateral sciatica, Cataract, Chronic bilateral low back pain, Colon polyps, Diverticulosis, DM type 2 (diabetes mellitus, type 2) (HCC), Environmental allergies, GERD (gastroesophageal reflux disease), Hyperlipidemia, Low back pain, Migraine, Osteoporosis, and PONV (postoperative nausea and vomiting).  She is being evaluated today for follow up regarding   Diabetes mellitus type 2-is managed with metformin 1000 mg extended release twice daily and glipzide 5 mg XL daily.   She does check her blood sugars at home and reports readings in the 100s to 200s, depending on what she eats.  She denies episodes of hypoglycemia.  Does take ramipril 2.5 mg daily for kidney protection. H Lab Results  Component Value Date   HGBA1C 7.6 (H) 09/08/2023   HGBA1C 6.9 05/18/2022   HGBA1C 7.7 (H) 02/15/2022   Vertigo - she has a history of vertigo and reports that her symptoms had resolved. Until about a week ago when she was getting out of bed and noticed that that world was spinning around here. This only happens when she is getting in and out of bed in the evening and morning. She has not had any nausea or vomiting.    Review of Systems See HPI   Past Medical History:  Diagnosis Date   Acid reflux    Allergic rhinitis    Allergy    Anemia    Arthritis    per MD- pt states no s/s of this    Asthma    Bilateral sciatica    Cataract    removed both eyes    Chronic bilateral low back pain    Colon polyps    Diverticulosis    DM type 2 (diabetes mellitus, type 2) (HCC)    Environmental allergies    flowers, pollen, trees   GERD (gastroesophageal reflux disease)    Hyperlipidemia    Low back pain    Migraine    past hx- control as allergies are controlled     Osteoporosis    PONV (postoperative nausea and vomiting)     Social History   Socioeconomic History   Marital status: Married    Spouse name: Not on file   Number of children: 2   Years of education: 16   Highest education level: Bachelor's degree (e.g., BA, AB, BS)  Occupational History   Occupation: Retired  Tobacco Use   Smoking status: Never   Smokeless tobacco: Never  Vaping Use   Vaping status: Never Used  Substance and Sexual Activity   Alcohol use: No    Alcohol/week: 0.0 standard drinks of alcohol   Drug use: No   Sexual activity: Not on file  Other Topics Concern   Not on file  Social History Narrative      Married 1 son one daughter, relocated to [place] after living in [place] for many years. 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   No caffeine.          Lives at home with her husband and brother.   Right-handed.   No caffeine use.   Social Drivers of Corporate investment banker Strain: Low Risk  (09/20/2023)   Overall Financial Resource Strain (CARDIA)    Difficulty of Paying Living Expenses: Not hard at all  Food Insecurity: No Food Insecurity (09/20/2023)   Hunger Vital Sign    Worried About Running Out of Food in the Last Year: Never true    Ran Out of Food in the Last Year: Never true  Transportation Needs: No Transportation Needs (09/20/2023)   PRAPARE - Administrator, Civil Service (Medical): No    Lack of Transportation (Non-Medical): No  Physical Activity: Insufficiently Active (09/20/2023)   Exercise Vital Sign    Days of Exercise per Week: 4 days    Minutes of Exercise per Session: 30 min  Stress: No Stress Concern Present (09/20/2023)   [employer] of Occupational Health - Occupational Stress Questionnaire    Feeling of Stress : Not at all  Recent Concern: Stress - Stress Concern Present (08/31/2023)   [employer] of Occupational Health - Occupational Stress Questionnaire    Feeling of Stress : To  some extent  Social Connections: Socially Integrated (09/20/2023)   Social Connection and Isolation Panel [NHANES]    Frequency of Communication with Friends and Family: More than three times a week    Frequency of Social Gatherings with Friends and Family: More than three times a week    Attends Religious Services: More than 4 times per year    Active Member of [employer] or Organizations: Yes    Attends Engineer, structural: More than 4 times per year    Marital Status: Married  Catering manager Violence: Not At Risk (09/20/2023)   Humiliation, Afraid, Rape, and Kick questionnaire    Fear of Current or Ex-Partner: No    Emotionally Abused: No    Physically Abused: No    Sexually Abused: No    Past Surgical History:  Procedure Laterality Date   CATARACT EXTRACTION, BILATERAL Bilateral 2012   CESAREAN SECTION     x 2   COLONOSCOPY     > 10 yrs ago- unsure if removed polyps as was told sev.different things after this colon    DENTAL SURGERY     molars removed    DILATION AND CURETTAGE OF UTERUS     NASAL SINUS SURGERY     TUBAL LIGATION      Family History  Problem Relation Age of Onset   Asthma Father    Emphysema Father    Alcohol abuse Father    Diabetes Mellitus II Brother        x 2   Hyperlipidemia Brother    Hypertension Brother    Pancreatic cancer Brother    Prostate cancer Brother    Diabetes Brother    Heart disease Maternal Grandmother        hardening of the arteries   Heart murmur Daughter    Glaucoma Brother    Breast cancer Neg Hx    Colon cancer Neg Hx    Colon polyps Neg Hx    Esophageal cancer Neg Hx    Rectal cancer Neg Hx    Stomach cancer Neg Hx     Allergies  Allergen Reactions   Peanut-Containing Drug Products Anaphylaxis   Apple     Pt stated it " causes sinuses to swell and immflamation"    Egg-Derived Products Nausea Only    Nausea only- can eat cakes with eggs    Milk-Related Compounds Nausea And Vomiting    Milk and  cheese *Can drink 1 cup a day*   Actos [Pioglitazone] Nausea Only   Soy Allergy (Do Not Select) Nausea Only    Pt states  had allergy testing and was told allergic to soy-causes nausea but no other reactions   Vicodin [Hydrocodone-Acetaminophen] Nausea Only    Current Outpatient Medications on File Prior to Visit  Medication Sig Dispense Refill   Accu-Chek FastClix Lancets MISC USE TO CHECK BLOOD GLUCOSE TWICE DAILY 204 each 3   ACCU-CHEK GUIDE test strip USE AS INSTRUCTED 300 strip 3   AIRSUPRA 90-80 MCG/ACT AERO Inhale into the lungs.     albuterol (VENTOLIN HFA) 108 (90 Base) MCG/ACT inhaler Inhale 2 puffs into the lungs every 4 (four) hours as needed for wheezing or shortness of breath (coughing fits). 18 g 1   Alcohol Swabs (DROPSAFE ALCOHOL PREP) 70 % PADS USE TO TEST BLOOD GLUCOSE FOUR TIMES DAILY AS DIRECTED 400 each 10   alendronate (FOSAMAX) 70 MG tablet TAKE 1 TABLET WEEKLY WITH 8 OZ OF PLAIN WATER 30 MINUTES BEFORE FIRST FOOD, DRINK OR MEDS. STAY UPRIGHT FOR 30 MINS 12 tablet 10   Ascorbic Acid (VITAMIN C) 1000 MG tablet Take 1,000 mg by mouth daily.     Azelastine-Fluticasone 137-50 MCG/ACT SUSP Place 1 spray into the nose 2 (two) times daily as needed (nasal symptoms). 69 g 1   Barberry-Oreg Grape-Goldenseal (BERBERINE COMPLEX PO) Take 1 capsule by mouth daily.     Blood Glucose Monitoring Suppl (ACCU-CHEK GUIDE) w/Device KIT 1 kit by Does not apply route as directed. 1 kit 0   Blood Glucose Monitoring Suppl DEVI 1 each by Does not apply route in the morning, at noon, and at bedtime. May substitute to any manufacturer covered by patient's insurance. 1 each 0   CINNAMON PO Take 1 tablet by mouth as needed (for blood glucose).     Cranberry 450 MG CAPS Take 450 mg by mouth daily.     Cyanocobalamin (VITAMIN B-12 CR) 1500 MCG TBCR Take 1,500 mcg by mouth daily.     Echinacea-Goldenseal (ECHINACEA COMB/GOLDEN SEAL PO) Take 1 tablet by mouth as needed (for prevention). Reported on  12/16/2015     EPINEPHrine 0.3 mg/0.3 mL IJ SOAJ injection Inject 0.3 mg into the muscle as needed for anaphylaxis. 3 each 1   fluticasone (FLONASE) 50 MCG/ACT nasal spray USE 2 SPRAYS IN EACH NOSTRIL EVERY DAY 48 g 0   gabapentin (NEURONTIN) 300 MG capsule TAKE 2 CAPSULES THREE TIMES DAILY 540 capsule 3   glipiZIDE (GLUCOTROL XL) 5 MG 24 hr tablet TAKE 1 TABLET EVERY DAY WITH BREAKFAST 90 tablet 3   Green Tea, Camillia sinensis, (GREEN TEA PO) Take 1 tablet by mouth as needed (for wellbeing). Reported on 12/16/2015     Lactobacillus (ACIDOPHILUS PROBIOTIC PO) Take 1 tablet by mouth daily.     levocetirizine (XYZAL) 5 MG tablet TAKE 1 TABLET EVERY EVENING 30 tablet 11   Melatonin ER 5 MG TBCR Take 5 mg by mouth at bedtime as needed (FOR SLEEP).     metFORMIN (GLUCOPHAGE-XR) 500 MG 24 hr tablet Take 2 tablets (1,000 mg total) by mouth 2 (two) times daily. 360 tablet 3   montelukast (SINGULAIR) 10 MG tablet TAKE 1 TABLET AT BEDTIME 90 tablet 3   Omega-3 Fatty Acids (OMEGA 3 PO) Take 1 tablet by mouth as needed.     omeprazole (PRILOSEC) 40 MG capsule TAKE 1 CAPSULE EVERY DAY 90 capsule 3   Polyethyl Glycol-Propyl Glycol (SYSTANE FREE OP) Apply 1 drop to eye daily. Uses thera work for dr eyes per pt     Potassium 95 MG TABS Take 95 mg by mouth  daily.     ramipril (ALTACE) 2.5 MG capsule Take 1 capsule (2.5 mg total) by mouth daily. 90 capsule 3   simvastatin (ZOCOR) 20 MG tablet TAKE 1 TABLET EVERY DAY 90 tablet 10   Stinging Nettle 2 % POWD Take 1 tablet by mouth daily at 12 noon. Reported on 12/16/2015     sucralfate (CARAFATE) 1 GM/10ML suspension Take 10 mLs (1 g total) by mouth 4 (four) times daily -  with meals and at bedtime. 420 mL 2   traZODone (DESYREL) 50 MG tablet TAKE 1/2 TO 1 TABLET AT BEDTIME AS NEEDED FOR SLEEP 90 tablet 3   TURMERIC PO Take 800 mg by mouth daily as needed (for well being). Reported on 12/16/2015     No current facility-administered medications on file prior to  visit.    BP 130/62   Pulse 69   Temp 98.2 F (36.8 C) (Oral)   Ht 5' 3.75" (1.619 m)   Wt 137 lb (62.1 kg)   SpO2 100%   BMI 23.70 kg/m       Objective:   Physical Exam Vitals and nursing note reviewed.  Constitutional:      Appearance: Normal appearance.  Eyes:     Extraocular Movements:     Right eye: Nystagmus (horizontal) present.     Left eye: Nystagmus (horizontal) present.  Cardiovascular:     Rate and Rhythm: Normal rate and regular rhythm.     Heart sounds: Normal heart sounds.  Pulmonary:     Effort: Pulmonary effort is normal.     Breath sounds: Normal breath sounds.  Musculoskeletal:        General: Normal range of motion.  Skin:    General: Skin is warm and dry.  Neurological:     General: No focal deficit present.     Mental Status: She is alert and oriented to person, place, and time.  Psychiatric:        Mood and Affect: Mood normal.        Behavior: Behavior normal.        Thought Content: Thought content normal.        Judgment: Judgment normal.       Assessment & Plan:  1. Diabetes mellitus treated with oral medication (HCC) (Primary)  - glipiZIDE (GLUCOTROL XL) 10 MG 24 hr tablet; Take 1 tablet (10 mg total) by mouth daily with breakfast.  Dispense: 90 tablet; Refill: 0 - metFORMIN (GLUCOPHAGE-XR) 500 MG 24 hr tablet; Take 2 tablets (1,000 mg total) by mouth 2 (two) times daily.  Dispense: 360 tablet; Refill: 0 - POC HgB A1c- 7.2 - has improved.  - Follow up in 3 months   2. Vertigo  - meclizine (ANTIVERT) 25 MG tablet; Take 1 tablet (25 mg total) by mouth 3 (three) times daily as needed for dizziness.  Dispense: 30 tablet; Refill: 0  Shirline Frees, NP

## 2024-01-10 ENCOUNTER — Ambulatory Visit (INDEPENDENT_AMBULATORY_CARE_PROVIDER_SITE_OTHER): Payer: Medicare Other | Admitting: Podiatry

## 2024-01-10 ENCOUNTER — Encounter: Payer: Self-pay | Admitting: Podiatry

## 2024-01-10 DIAGNOSIS — G6289 Other specified polyneuropathies: Secondary | ICD-10-CM | POA: Diagnosis not present

## 2024-01-10 DIAGNOSIS — E119 Type 2 diabetes mellitus without complications: Secondary | ICD-10-CM

## 2024-01-10 DIAGNOSIS — M79675 Pain in left toe(s): Secondary | ICD-10-CM

## 2024-01-10 DIAGNOSIS — B351 Tinea unguium: Secondary | ICD-10-CM | POA: Diagnosis not present

## 2024-01-10 DIAGNOSIS — M79674 Pain in right toe(s): Secondary | ICD-10-CM | POA: Diagnosis not present

## 2024-01-18 NOTE — Progress Notes (Signed)
  Subjective:  Patient ID: Kimberly Lam, female    DOB: 1946-06-22,  MRN: [id number]  78 y.o. female presents at risk foot care. Patient has history of diabetes and peripheral neuropathy and painful mycotic toenails x 10 which interfere with daily activities. Pain is relieved with periodic professional debridement.  Chief Complaint  Patient presents with   Debridement    Trim toenails - diabetic - 7.2, Dr. Kandee Keen Nafziger-LOV 01/09/24   New problem(s): None   PCP is Nafziger, Kandee Keen, NP.  Allergies  Allergen Reactions   Peanut-Containing Drug Products Anaphylaxis   Apple     Pt stated it " causes sinuses to swell and immflamation"    Egg-Derived Products Nausea Only    Nausea only- can eat cakes with eggs    Milk-Related Compounds Nausea And Vomiting    Milk and cheese *Can drink 1 cup a day*   Actos [Pioglitazone] Nausea Only   Soy Allergy (Do Not Select) Nausea Only    Pt states had allergy testing and was told allergic to soy-causes nausea but no other reactions   Vicodin [Hydrocodone-Acetaminophen] Nausea Only    Review of Systems: Negative except as noted in the HPI.   Objective:  Kimberly Lam is a pleasant 78 y.o. female WD, WN in NAD. AAO x 3.  Vascular Examination: Palpable pedal pulses. CFT immediate b/l. Pedal hair present. No edema. No pain with calf compression b/l. Skin temperature gradient WNL b/l. No varicosities noted. No cyanosis or clubbing noted.  Neurological Examination: Sensation grossly intact b/l with 10 gram monofilament. Vibratory sensation intact b/l. Pt has subjective symptoms of neuropathy.   Dermatological Examination: Pedal skin with normal turgor, texture and tone b/l. No open wounds nor interdigital macerations noted. Toenails 1-5 b/l thick, discolored, elongated with subungual debris and pain on dorsal palpation. No hyperkeratotic lesions noted b/l.   Musculoskeletal Examination: Muscle strength 5/5 to b/l LE.  No pain, crepitus  noted b/l. No gross pedal deformities. Patient ambulates independently without assistive aids.   Radiographs: None Last A1c:      Latest Ref Rng & Units 01/09/2024    2:07 PM 09/08/2023    1:05 PM  Hemoglobin A1C  Hemoglobin-A1c 4.0 - 5.6 % 7.2  7.6      Assessment:   1. Pain due to onychomycosis of toenails of both feet   2. Other polyneuropathy   3. Diabetes mellitus without complication (HCC)     Plan:  Patient was evaluated and treated. All patient's and/or POA's questions/concerns addressed on today's visit. Toenails 1-5 debrided in length and girth without incident. Continue soft, supportive shoe gear daily. Report any pedal injuries to medical professional. Call office if there are any questions/concerns. -Continue foot and shoe inspections daily. Monitor blood glucose per PCP/Endocrinologist's recommendations. -Patient/POA to call should there be question/concern in the interim.  Return in about 3 months (around 04/09/2024).  Freddie Breech, DPM      [place] LOCATION: [address] [place] [address]                   Office [phone]   [facility] LOCATION: [address] [place], [place] [address] Office [phone]

## 2024-01-21 NOTE — Progress Notes (Unsigned)
Follow Up Note  RE: Kimberly Lam MRN: [id number] DOB: 15-May-1946 Date of Office Visit: 01/22/2024  Referring provider: Shirline Frees, NP Primary care provider: Shirline Frees, NP  Chief Complaint: No chief complaint on file.  History of Present Illness: I had the pleasure of seeing Kimberly Lam for a follow up visit at the [facility] on 01/21/2024. She is a 78 y.o. 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 Her previous allergy office visit was on 09/25/2023 with Dr. Selena Batten. Today is a regular follow up visit.  Discussed the use of AI scribe software for clinical note transcription with the patient, who gave verbal consent to proceed.  History of Present Illness              Reviewed labs. Your environmental allergy panel was essentially all negative. We can discuss at the next visit whether to keep doing injections versus stopping them versus repeating the skin testing. Regarding the foods - coffee and soy was negative. Peanut and milk was slightly elevated. Continue to avoid. If interested in reintroduction recommend skin testing first.    Assessment and Plan: Kimberly Lam is a 78 y.o. female with: Seasonal allergic rhinitis due to pollen Allergic rhinitis due to mold Allergic rhinitis due to dust mite Allergic rhinitis due to animal dander Allergic conjunctivitis of both eyes Past history - started AIT on 10/27/2015 (TREE/GRASS-WEED-MITE-CAT-DOG/MOLD-CR) every 2 weeks now unable to go to every 3-4 weeks. [name] not covered. Interim history - nasal congestion unchanged. Patient reports headaches when allergy shots are delayed. Continue environmental control measures. Get bloodwork to see if your allergies changed.  Continue allergy injections every 2 weeks.  Continue Singulair (montelukast) 10mg  daily at night. Use Flonase (fluticasone) nasal spray 1 spray per nostril  twice a day as needed for nasal congestion.  Use azelastine nasal spray 1-2 sprays per nostril twice a day as needed for runny nose/drainage. Use over the counter antihistamines such as Zyrtec (cetirizine), Claritin (loratadine), Allegra (fexofenadine), or Xyzal (levocetirizine) daily as needed. May switch antihistamines every few months. Nasal saline spray (i.e., Simply Saline) or nasal saline lavage (i.e., NeilMed) is recommended as needed and prior to medicated nasal sprays.   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 Follow up with ENT regarding the sinus surgery.   Other adverse food reactions, not elsewhere classified, subsequent encounter Past history - soy caused increased rhinitis symptoms. Sometimes eats peanuts/milk which causes nasal congestion. Can't afford Epipen.  Interim history - no reactions. Continue to avoid peanuts, milk, caffeine and soy.   Get bloodwork.  For mild symptoms you can take over the counter antihistamines such as Benadryl and monitor symptoms closely. If symptoms worsen or if you have severe symptoms including breathing issues, throat closure, significant swelling, whole body hives, severe diarrhea and vomiting, lightheadedness then seek immediate medical care.   Gastroesophageal reflux disease, unspecified whether esophagitis present Controlled Continue dietary and lifestyle modifications. Continue omeprazole 40 mg once a day to control reflux.   Moderate persistent asthma, uncomplicated Past history - Felt better with [facility] but could not afford to purchase after samples ran out. Reports that inhalers cause her mouth to get sores and become white. Interim history - only using albuterol 1-2 times per week prior to certain outdoor exertion. Spirometry is expensive.  Daily controller medication(s): Continue Singulair (montelukast) 10 mg daily at night. May use albuterol rescue inhaler 2  puffs or nebulizer  every 4 to 6 hours as needed for shortness of breath, chest tightness, coughing, and wheezing. May use albuterol rescue inhaler 2 puffs 5 to 15 minutes prior to strenuous physical activities. Monitor frequency of use - if you need to use it more than twice per week on a consistent basis let us know.  Get spirometry once per year unless symptoms change due to costs. Assessment and Plan              No follow-ups on file.  No orders of the defined types were placed in this encounter.  Lab Orders  No laboratory test(s) ordered today    Diagnostics: Spirometry:  Tracings reviewed. Her effort: {Blank single:19197::"Good reproducible efforts.","It was hard to get consistent efforts and there is a question as to whether this reflects a maximal maneuver.","Poor effort, data can not be interpreted."} FVC: ***L FEV1: ***L, ***% predicted FEV1/FVC ratio: ***% Interpretation: {Blank single:19197::"Spirometry consistent with mild obstructive disease","Spirometry consistent with moderate obstructive disease","Spirometry consistent with severe obstructive disease","Spirometry consistent with possible restrictive disease","Spirometry consistent with mixed obstructive and restrictive disease","Spirometry uninterpretable due to technique","Spirometry consistent with normal pattern","No overt abnormalities noted given today's efforts"}.  Please see scanned spirometry results for details.  Skin Testing: {Blank single:19197::"Select foods","Environmental allergy panel","Environmental allergy panel and select foods","Food allergy panel","None","Deferred due to recent antihistamines use"}. *** Results discussed with patient/family.   Medication List:  Current Outpatient Medications  Medication Sig Dispense Refill   Accu-Chek FastClix Lancets MISC USE TO CHECK BLOOD GLUCOSE TWICE DAILY 204 each 3   ACCU-CHEK GUIDE test strip USE AS INSTRUCTED 300 strip 3   AIRSUPRA 90-80 MCG/ACT AERO Inhale into the  lungs.     albuterol (VENTOLIN HFA) 108 (90 Base) MCG/ACT inhaler Inhale 2 puffs into the lungs every 4 (four) hours as needed for wheezing or shortness of breath (coughing fits). 18 g 1   Alcohol Swabs (DROPSAFE ALCOHOL PREP) 70 % PADS USE TO TEST BLOOD GLUCOSE FOUR TIMES DAILY AS DIRECTED 400 each 10   alendronate (FOSAMAX) 70 MG tablet TAKE 1 TABLET WEEKLY WITH 8 OZ OF PLAIN WATER 30 MINUTES BEFORE FIRST FOOD, DRINK OR MEDS. STAY UPRIGHT FOR 30 MINS 12 tablet 10   Ascorbic Acid (VITAMIN C) 1000 MG tablet Take 1,000 mg by mouth daily.     Azelastine-Fluticasone 137-50 MCG/ACT SUSP Place 1 spray into the nose 2 (two) times daily as needed (nasal symptoms). 69 g 1   Barberry-Oreg Grape-Goldenseal (BERBERINE COMPLEX PO) Take 1 capsule by mouth daily.     Blood Glucose Monitoring Suppl (ACCU-CHEK GUIDE) w/Device KIT 1 kit by Does not apply route as directed. 1 kit 0   Blood Glucose Monitoring Suppl DEVI 1 each by Does not apply route in the morning, at noon, and at bedtime. May substitute to any manufacturer covered by patient's insurance. 1 each 0   CINNAMON PO Take 1 tablet by mouth as needed (for blood glucose).     Cranberry 450 MG CAPS Take 450 mg by mouth daily.     Cyanocobalamin (VITAMIN B-12 CR) 1500 MCG TBCR Take 1,500 mcg by mouth daily.     Echinacea-Goldenseal (ECHINACEA COMB/GOLDEN SEAL PO) Take 1 tablet by mouth as needed (for prevention). Reported on 12/16/2015     EPINEPHrine 0.3 mg/0.3 mL IJ SOAJ injection Inject 0.3 mg into the muscle as needed for anaphylaxis. 3 each 1   fluticasone (FLONASE) 50 MCG/ACT nasal spray USE 2 SPRAYS IN EACH NOSTRIL EVERY DAY 48 g 0  gabapentin (NEURONTIN) 300 MG capsule TAKE 2 CAPSULES THREE TIMES DAILY 540 capsule 3   glipiZIDE (GLUCOTROL XL) 10 MG 24 hr tablet Take 1 tablet (10 mg total) by mouth daily with breakfast. 90 tablet 0   Green Tea, Camillia sinensis, (GREEN TEA PO) Take 1 tablet by mouth as needed (for wellbeing). Reported on 12/16/2015      Lactobacillus (ACIDOPHILUS PROBIOTIC PO) Take 1 tablet by mouth daily.     levocetirizine (XYZAL) 5 MG tablet TAKE 1 TABLET EVERY EVENING 30 tablet 11   meclizine (ANTIVERT) 25 MG tablet Take 1 tablet (25 mg total) by mouth 3 (three) times daily as needed for dizziness. 30 tablet 0   Melatonin ER 5 MG TBCR Take 5 mg by mouth at bedtime as needed (FOR SLEEP).     metFORMIN (GLUCOPHAGE-XR) 500 MG 24 hr tablet Take 2 tablets (1,000 mg total) by mouth 2 (two) times daily. 360 tablet 0   montelukast (SINGULAIR) 10 MG tablet TAKE 1 TABLET AT BEDTIME 90 tablet 3   Omega-3 Fatty Acids (OMEGA 3 PO) Take 1 tablet by mouth as needed.     omeprazole (PRILOSEC) 40 MG capsule TAKE 1 CAPSULE EVERY DAY 90 capsule 3   Polyethyl Glycol-Propyl Glycol (SYSTANE FREE OP) Apply 1 drop to eye daily. Uses thera work for dr eyes per pt     Potassium 95 MG TABS Take 95 mg by mouth daily.     ramipril (ALTACE) 2.5 MG capsule Take 1 capsule (2.5 mg total) by mouth daily. 90 capsule 3   simvastatin (ZOCOR) 20 MG tablet TAKE 1 TABLET EVERY DAY 90 tablet 10   Stinging Nettle 2 % POWD Take 1 tablet by mouth daily at 12 noon. Reported on 12/16/2015     sucralfate (CARAFATE) 1 GM/10ML suspension Take 10 mLs (1 g total) by mouth 4 (four) times daily -  with meals and at bedtime. 420 mL 2   traZODone (DESYREL) 50 MG tablet TAKE 1/2 TO 1 TABLET AT BEDTIME AS NEEDED FOR SLEEP 90 tablet 3   TURMERIC PO Take 800 mg by mouth daily as needed (for well being). Reported on 12/16/2015     No current facility-administered medications for this visit.   Allergies: Allergies  Allergen Reactions   Peanut-Containing Drug Products Anaphylaxis   Apple     Pt stated it " causes sinuses to swell and immflamation"    Egg-Derived Products Nausea Only    Nausea only- can eat cakes with eggs    Milk-Related Compounds Nausea And Vomiting    Milk and cheese *Can drink 1 cup a day*   Actos [Pioglitazone] Nausea Only   Soy Allergy (Do Not  Select) Nausea Only    Pt states had allergy testing and was told allergic to soy-causes nausea but no other reactions   Vicodin [Hydrocodone-Acetaminophen] Nausea Only   I reviewed her past medical history, social history, family history, and environmental history and no significant changes have been reported from her previous visit.  Review of Systems  Constitutional:  Negative for appetite change, chills, fever and unexpected weight change.  HENT:  Positive for congestion. Negative for rhinorrhea.   Eyes:  Negative for itching.  Respiratory:  Negative for cough, chest tightness, shortness of breath and wheezing.   Gastrointestinal:  Negative for abdominal pain.  Skin:  Negative for rash.  Allergic/Immunologic: Positive for environmental allergies and food allergies.  Neurological:  Positive for dizziness. Negative for headaches.    Objective: There were no vitals  taken for this visit. There is no height or weight on file to calculate BMI. Physical Exam Vitals and nursing note reviewed.  Constitutional:      Appearance: Normal appearance. She is well-developed.  HENT:     Head: Normocephalic and atraumatic.     Right Ear: Tympanic membrane and external ear normal.     Left Ear: Tympanic membrane and external ear normal.     Nose:     Comments: Perforated nasal septum noted.    Mouth/Throat:     Mouth: Mucous membranes are moist.     Pharynx: Oropharynx is clear.  Eyes:     Conjunctiva/sclera: Conjunctivae normal.  Cardiovascular:     Rate and Rhythm: Normal rate and regular rhythm.     Heart sounds: Normal heart sounds. No murmur heard. Pulmonary:     Effort: Pulmonary effort is normal.     Breath sounds: Normal breath sounds. No wheezing, rhonchi or rales.  Musculoskeletal:     Cervical back: Neck supple.  Skin:    General: Skin is warm.     Findings: No rash.  Neurological:     Mental Status: She is alert and oriented to person, place, and time.  Psychiatric:         Behavior: Behavior normal.    Previous notes and tests were reviewed. The plan was reviewed with the patient/family, and all questions/concerned were addressed.  It was my pleasure to see Kimberly Lam today and participate in her care. Please feel free to contact me with any questions or concerns.  Sincerely,  Wyline Mood, DO Allergy & Immunology  [facility] office: [phone] [facility] office: [phone]

## 2024-01-22 ENCOUNTER — Other Ambulatory Visit: Payer: Self-pay

## 2024-01-22 ENCOUNTER — Telehealth: Payer: Self-pay | Admitting: Allergy

## 2024-01-22 ENCOUNTER — Ambulatory Visit: Payer: Medicare Other | Admitting: Allergy

## 2024-01-22 ENCOUNTER — Encounter: Payer: Self-pay | Admitting: Allergy

## 2024-01-22 ENCOUNTER — Ambulatory Visit (INDEPENDENT_AMBULATORY_CARE_PROVIDER_SITE_OTHER): Payer: Medicare Other

## 2024-01-22 VITALS — BP 110/50 | HR 70 | Temp 97.9°F | Ht 63.75 in | Wt 134.4 lb

## 2024-01-22 DIAGNOSIS — J3089 Other allergic rhinitis: Secondary | ICD-10-CM | POA: Diagnosis not present

## 2024-01-22 DIAGNOSIS — J309 Allergic rhinitis, unspecified: Secondary | ICD-10-CM | POA: Diagnosis not present

## 2024-01-22 DIAGNOSIS — J302 Other seasonal allergic rhinitis: Secondary | ICD-10-CM

## 2024-01-22 DIAGNOSIS — J4540 Moderate persistent asthma, uncomplicated: Secondary | ICD-10-CM

## 2024-01-22 DIAGNOSIS — H1013 Acute atopic conjunctivitis, bilateral: Secondary | ICD-10-CM

## 2024-01-22 DIAGNOSIS — J3489 Other specified disorders of nose and nasal sinuses: Secondary | ICD-10-CM | POA: Diagnosis not present

## 2024-01-22 DIAGNOSIS — H1010 Acute atopic conjunctivitis, unspecified eye: Secondary | ICD-10-CM

## 2024-01-22 DIAGNOSIS — K219 Gastro-esophageal reflux disease without esophagitis: Secondary | ICD-10-CM

## 2024-01-22 NOTE — Patient Instructions (Addendum)
Let me know when you get your new insurance and will send in refills of the medications.   Asthma Daily controller medication(s): Continue Singulair (montelukast) 10 mg daily at night. May use albuterol rescue inhaler 2 puffs or nebulizer every 4 to 6 hours as needed for shortness of breath, chest tightness, coughing, and wheezing. May use albuterol rescue inhaler 2 puffs 5 to 15 minutes prior to strenuous physical activities. Monitor frequency of use - if you need to use it more than twice per week on a consistent basis let us know.  Asthma control goals:  Full participation in all desired activities (may need albuterol before activity) Albuterol use two times or less a week on average (not counting use with activity) Cough interfering with sleep two times or less a month Oral steroids no more than once a year No hospitalizations   Environmental allergies Continue environmental control measures. Continue allergy injections every 3 weeks - given today.  Continue Singulair (montelukast) 10mg  daily at night. Use Flonase (fluticasone) nasal spray 1 spray per nostril twice a day as needed for nasal congestion.  Use azelastine nasal spray 1-2 sprays per nostril twice a day as needed for runny nose/drainage. Use over the counter antihistamines such as Zyrtec (cetirizine), Claritin (loratadine), Allegra (fexofenadine), or Xyzal (levocetirizine) daily as needed. May switch antihistamines every few months. Nasal saline spray (i.e., Simply Saline) or nasal saline lavage (i.e., NeilMed) is recommended as needed and prior to medicated nasal sprays. Bloodwork was only borderline positive to 1 tree pollen and 1 weed pollen. Consider skin testing next.  Skin testing instructions:  Make sure you don't take any antihistamines for 3 days before the skin testing appointment. Don't put any lotion on the back and arms on the day of testing.  Plan on being here for 30-60 minutes.   Septal perforation/nasal  congestion Refer to ENT.   Reflux Continue dietary and lifestyle modifications. Continue omeprazole 40 mg once a day to control reflux.  Food Continue to avoid caffeine and soy.   For mild symptoms you can take over the counter antihistamines such as Benadryl and monitor symptoms closely. If symptoms worsen or if you have severe symptoms including breathing issues, throat closure, significant swelling, whole body hives, severe diarrhea and vomiting, lightheadedness then seek immediate medical care.  Follow up in 4 months or sooner if needed.

## 2024-01-22 NOTE — Telephone Encounter (Signed)
Refer to [facility] ENT for chronic nasal congestion, septal perforation. Minimal allergies noted on bloodwork - on allergy shots.

## 2024-01-30 ENCOUNTER — Encounter (HOSPITAL_COMMUNITY): Payer: Self-pay

## 2024-01-30 ENCOUNTER — Other Ambulatory Visit (HOSPITAL_COMMUNITY): Payer: Self-pay

## 2024-01-30 ENCOUNTER — Telehealth: Payer: Self-pay | Admitting: Adult Health

## 2024-01-30 ENCOUNTER — Other Ambulatory Visit: Payer: Self-pay | Admitting: Adult Health

## 2024-01-30 MED ORDER — ONETOUCH VERIO VI STRP
ORAL_STRIP | 12 refills | Status: DC
  Filled 2024-01-30: qty 100, 30d supply, fill #0
  Filled 2024-01-30: qty 100, 90d supply, fill #0
  Filled 2024-02-22: qty 100, 90d supply, fill #1

## 2024-01-30 MED ORDER — ONETOUCH VERIO W/DEVICE KIT
PACK | 0 refills | Status: DC
  Filled 2024-01-30: qty 1, 30d supply, fill #0
  Filled 2024-01-31: qty 1, fill #0
  Filled 2024-02-02: qty 1, 30d supply, fill #0

## 2024-01-30 NOTE — Telephone Encounter (Signed)
Patient is requesting a prescription for "One Touch" strips and the machine also.  Patient would like a paper script today as she is out and has gotten new insurance and they only cover the "One Touch"  Please advise at [phone]

## 2024-01-30 NOTE — Telephone Encounter (Signed)
Rx sent in

## 2024-01-30 NOTE — Telephone Encounter (Signed)
Patient is requesting the remaining scripts for the One Touch go to [facility]  [place] [facility] Phone: [phone]  Fax: [phone]

## 2024-01-31 ENCOUNTER — Other Ambulatory Visit (HOSPITAL_COMMUNITY): Payer: Self-pay

## 2024-01-31 ENCOUNTER — Other Ambulatory Visit: Payer: Self-pay

## 2024-02-02 ENCOUNTER — Other Ambulatory Visit: Payer: Self-pay

## 2024-02-02 ENCOUNTER — Other Ambulatory Visit: Payer: Self-pay | Admitting: Adult Health

## 2024-02-02 ENCOUNTER — Encounter: Payer: Self-pay | Admitting: Adult Health

## 2024-02-02 ENCOUNTER — Other Ambulatory Visit (HOSPITAL_COMMUNITY): Payer: Self-pay

## 2024-02-02 DIAGNOSIS — K2100 Gastro-esophageal reflux disease with esophagitis, without bleeding: Secondary | ICD-10-CM

## 2024-02-02 MED ORDER — ONETOUCH DELICA PLUS LANCET33G MISC
12 refills | Status: AC
  Filled 2024-02-02: qty 100, 100d supply, fill #0
  Filled 2024-05-06: qty 100, 100d supply, fill #1
  Filled 2024-05-27 – 2024-08-13 (×2): qty 100, 100d supply, fill #0
  Filled 2024-11-21: qty 100, 100d supply, fill #1

## 2024-02-02 MED ORDER — OMEPRAZOLE 40 MG PO CPDR: 40.0000 mg | DELAYED_RELEASE_CAPSULE | Freq: Every day | ORAL | 3 refills | Status: DC

## 2024-02-02 MED ORDER — FLUTICASONE PROPIONATE 50 MCG/ACT NA SUSP: 2.0000 | Freq: Every day | NASAL | 0 refills | Status: DC

## 2024-02-02 MED ORDER — ALBUTEROL SULFATE HFA 108 (90 BASE) MCG/ACT IN AERS
2.0000 | INHALATION_SPRAY | RESPIRATORY_TRACT | 1 refills | Status: AC | PRN
  Filled 2024-03-01: qty 18, 17d supply, fill #0
  Filled 2024-05-27: qty 18, fill #0

## 2024-02-02 MED ORDER — MONTELUKAST SODIUM 10 MG PO TABS: 10.0000 mg | ORAL_TABLET | Freq: Every day | ORAL | 3 refills | Status: DC

## 2024-02-02 MED ORDER — LEVOCETIRIZINE DIHYDROCHLORIDE 5 MG PO TABS: 5.0000 mg | ORAL_TABLET | Freq: Every evening | ORAL | 11 refills | Status: DC

## 2024-02-02 MED ORDER — EPINEPHRINE 0.3 MG/0.3ML IJ SOAJ: 0.3000 mg | INTRAMUSCULAR | 1 refills | Status: AC | PRN

## 2024-02-05 ENCOUNTER — Other Ambulatory Visit: Payer: Self-pay

## 2024-02-05 MED ORDER — GABAPENTIN 300 MG PO CAPS
300.0000 mg | ORAL_CAPSULE | Freq: Three times a day (TID) | ORAL | 3 refills | Status: DC
  Filled 2024-03-01 (×2): qty 270, 90d supply, fill #0
  Filled 2024-03-04: qty 90, 30d supply, fill #0

## 2024-02-06 ENCOUNTER — Other Ambulatory Visit (HOSPITAL_COMMUNITY): Payer: Self-pay

## 2024-02-06 ENCOUNTER — Ambulatory Visit (INDEPENDENT_AMBULATORY_CARE_PROVIDER_SITE_OTHER): Payer: Medicare Other | Admitting: *Deleted

## 2024-02-06 ENCOUNTER — Encounter: Payer: Self-pay | Admitting: Adult Health

## 2024-02-06 ENCOUNTER — Other Ambulatory Visit: Payer: Self-pay | Admitting: Adult Health

## 2024-02-06 DIAGNOSIS — E119 Type 2 diabetes mellitus without complications: Secondary | ICD-10-CM

## 2024-02-06 DIAGNOSIS — M8589 Other specified disorders of bone density and structure, multiple sites: Secondary | ICD-10-CM

## 2024-02-06 DIAGNOSIS — J309 Allergic rhinitis, unspecified: Secondary | ICD-10-CM | POA: Diagnosis not present

## 2024-02-06 MED ORDER — ONETOUCH VERIO REFLECT W/DEVICE KIT: PACK | 0 refills | Status: DC

## 2024-02-06 MED ORDER — RAMIPRIL 2.5 MG PO CAPS: 2.5000 mg | ORAL_CAPSULE | Freq: Every day | ORAL | 0 refills | Status: DC

## 2024-02-06 NOTE — Telephone Encounter (Signed)
This has been taking care of. Please see other note.

## 2024-02-06 NOTE — Telephone Encounter (Signed)
Glucometer has been sent to pharmacy. Pt also wanted a short refill for ramipril due to mail order being on backorder. Both have been filled.

## 2024-02-06 NOTE — Telephone Encounter (Unsigned)
Copied from CRM [id number]. Topic: Clinical - Medication Refill >> Feb 06, 2024  5:30 PM Tiffany H wrote: Most Recent Primary Care Visit:  Provider: Shirline Frees  Department: [facility]  Visit Type: OFFICE VISIT  Date: 01/09/2024  Medication: glipiZIDE (GLUCOTROL XL) 10 MG 24 hr tablet [[id number]] simvastatin (ZOCOR) 20 MG tablet [[id number]] alendronate (FOSAMAX) 70 MG tablet [[id number]] metFORMIN (GLUCOPHAGE-XR) 500 MG 24 hr tablet [[id number]]  Has the patient contacted their pharmacy? Yes (Agent: If no, request that the patient contact the pharmacy for the refill. If patient does not wish to contact the pharmacy document the reason why and proceed with request.) (Agent: If yes, when and what did the pharmacy advise?)  Is this the correct pharmacy for this prescription? Yes If no, delete pharmacy and type the correct one.  This is the patient's preferred pharmacy:   [facility], [place] - [address] [place] [address] Phone: [phone] Fax: [phone]   Has the prescription been filled recently? No  Is the patient out of the medication? Yes  Has the patient been seen for an appointment in the last year OR does the patient have an upcoming appointment? Yes  Can we respond through MyChart? No  Agent: Please be advised that Rx refills may take up to 3 business days. We ask that you follow-up with your pharmacy.

## 2024-02-08 ENCOUNTER — Other Ambulatory Visit: Payer: Self-pay

## 2024-02-08 ENCOUNTER — Other Ambulatory Visit (HOSPITAL_COMMUNITY): Payer: Self-pay

## 2024-02-08 DIAGNOSIS — K2100 Gastro-esophageal reflux disease with esophagitis, without bleeding: Secondary | ICD-10-CM

## 2024-02-08 MED ORDER — ALENDRONATE SODIUM 70 MG PO TABS
70.0000 mg | ORAL_TABLET | ORAL | 3 refills | Status: DC
  Filled 2024-02-08 – 2024-03-01 (×2): qty 12, 84d supply, fill #0
  Filled 2024-03-04: qty 4, 28d supply, fill #0

## 2024-02-08 MED ORDER — LEVOCETIRIZINE DIHYDROCHLORIDE 5 MG PO TABS
5.0000 mg | ORAL_TABLET | Freq: Every evening | ORAL | 11 refills | Status: AC
  Filled 2024-03-01 – 2024-03-04 (×2): qty 30, 30d supply, fill #0
  Filled 2024-03-15: qty 30, 30d supply, fill #1
  Filled 2024-05-06: qty 30, 30d supply, fill #2
  Filled 2024-05-27 – 2024-06-06 (×2): qty 30, 30d supply, fill #0
  Filled 2024-06-29: qty 30, 30d supply, fill #1
  Filled 2024-07-29 – 2025-01-05 (×20): qty 30, 30d supply, fill #2

## 2024-02-08 MED ORDER — AZELASTINE HCL 0.1 % NA SOLN
2.0000 | Freq: Two times a day (BID) | NASAL | 1 refills | Status: AC
  Filled 2024-05-06 – 2024-07-13 (×3): qty 90, 75d supply, fill #0

## 2024-02-08 MED ORDER — OMEPRAZOLE 40 MG PO CPDR
40.0000 mg | DELAYED_RELEASE_CAPSULE | Freq: Every day | ORAL | 3 refills | Status: AC
  Filled 2024-03-01: qty 90, 90d supply, fill #0
  Filled 2024-03-04: qty 30, 30d supply, fill #0
  Filled 2024-03-15: qty 30, 30d supply, fill #1
  Filled 2024-05-06: qty 30, 30d supply, fill #2
  Filled 2024-05-27 – 2024-06-06 (×2): qty 30, 30d supply, fill #0
  Filled 2024-06-29: qty 30, 30d supply, fill #1
  Filled 2024-07-29 – 2024-10-15 (×8): qty 30, 30d supply, fill #2
  Filled 2025-01-05: qty 30, 30d supply, fill #3

## 2024-02-08 MED ORDER — GLIPIZIDE ER 10 MG PO TB24
10.0000 mg | ORAL_TABLET | Freq: Every day | ORAL | 0 refills | Status: DC
  Filled 2024-02-08 – 2024-03-01 (×2): qty 90, 90d supply, fill #0
  Filled 2024-03-04: qty 30, 30d supply, fill #0
  Filled 2024-03-15: qty 30, 30d supply, fill #1

## 2024-02-08 MED ORDER — MONTELUKAST SODIUM 10 MG PO TABS
10.0000 mg | ORAL_TABLET | Freq: Every day | ORAL | 3 refills | Status: AC
  Filled 2024-03-01: qty 90, 90d supply, fill #0
  Filled 2024-03-04: qty 30, 30d supply, fill #0
  Filled 2024-03-15: qty 30, 30d supply, fill #1
  Filled 2024-05-06: qty 30, 30d supply, fill #2
  Filled 2024-05-27 – 2024-06-06 (×2): qty 30, 30d supply, fill #0
  Filled 2024-06-29: qty 30, 30d supply, fill #1
  Filled 2024-07-29: qty 30, 30d supply, fill #2
  Filled 2024-08-28 – 2024-09-03 (×2): qty 30, 30d supply, fill #3
  Filled 2024-10-07 – 2024-10-15 (×2): qty 30, 30d supply, fill #4
  Filled 2024-11-05 – 2024-11-07 (×2): qty 30, 30d supply, fill #5
  Filled 2024-12-06: qty 30, 30d supply, fill #6
  Filled 2025-01-05: qty 30, 30d supply, fill #7

## 2024-02-08 MED ORDER — AIRSUPRA 90-80 MCG/ACT IN AERO
2.0000 | INHALATION_SPRAY | Freq: Four times a day (QID) | RESPIRATORY_TRACT | 1 refills | Status: AC | PRN
  Filled 2024-05-27: qty 32.1, fill #0
  Filled 2024-10-15: qty 10.7, 15d supply, fill #0

## 2024-02-08 MED ORDER — FLUTICASONE PROPIONATE 50 MCG/ACT NA SUSP
2.0000 | Freq: Every day | NASAL | 0 refills | Status: DC
  Filled 2024-03-01 – 2024-05-06 (×2): qty 48, 90d supply, fill #0

## 2024-02-08 MED ORDER — SIMVASTATIN 20 MG PO TABS
20.0000 mg | ORAL_TABLET | Freq: Every day | ORAL | 3 refills | Status: DC
  Filled 2024-02-08 – 2024-03-01 (×3): qty 90, 90d supply, fill #0
  Filled 2024-03-04: qty 30, 30d supply, fill #0
  Filled 2024-03-15: qty 30, 30d supply, fill #1
  Filled 2024-05-06: qty 30, 30d supply, fill #2
  Filled 2024-05-27 – 2024-06-06 (×2): qty 30, 30d supply, fill #0
  Filled 2024-06-29: qty 30, 30d supply, fill #1
  Filled 2024-07-29: qty 30, 30d supply, fill #2
  Filled 2024-08-28 – 2024-09-03 (×2): qty 30, 30d supply, fill #3
  Filled 2024-10-07 – 2024-10-16 (×4): qty 30, 30d supply, fill #4

## 2024-02-08 MED ORDER — METFORMIN HCL ER 500 MG PO TB24
1000.0000 mg | ORAL_TABLET | Freq: Two times a day (BID) | ORAL | 0 refills | Status: DC
  Filled 2024-02-08: qty 360, 90d supply, fill #0

## 2024-02-09 ENCOUNTER — Other Ambulatory Visit: Payer: Self-pay

## 2024-02-12 ENCOUNTER — Other Ambulatory Visit (HOSPITAL_COMMUNITY): Payer: Self-pay

## 2024-02-14 ENCOUNTER — Encounter: Payer: Self-pay | Admitting: Adult Health

## 2024-02-17 ENCOUNTER — Other Ambulatory Visit: Payer: Self-pay | Admitting: Adult Health

## 2024-02-17 DIAGNOSIS — E119 Type 2 diabetes mellitus without complications: Secondary | ICD-10-CM

## 2024-02-19 ENCOUNTER — Ambulatory Visit (INDEPENDENT_AMBULATORY_CARE_PROVIDER_SITE_OTHER): Admitting: *Deleted

## 2024-02-19 DIAGNOSIS — J309 Allergic rhinitis, unspecified: Secondary | ICD-10-CM | POA: Diagnosis not present

## 2024-02-20 ENCOUNTER — Other Ambulatory Visit (HOSPITAL_COMMUNITY): Payer: Self-pay

## 2024-02-21 ENCOUNTER — Other Ambulatory Visit: Payer: Self-pay

## 2024-02-22 ENCOUNTER — Encounter: Payer: Self-pay | Admitting: Adult Health

## 2024-02-22 ENCOUNTER — Other Ambulatory Visit (HOSPITAL_COMMUNITY): Payer: Self-pay

## 2024-02-23 ENCOUNTER — Other Ambulatory Visit (HOSPITAL_COMMUNITY): Payer: Self-pay

## 2024-02-23 MED ORDER — ONETOUCH VERIO VI STRP
ORAL_STRIP | 12 refills | Status: DC
  Filled 2024-02-23: qty 100, 90d supply, fill #0
  Filled 2024-02-23: qty 200, 66d supply, fill #0
  Filled 2024-02-26: qty 200, 67d supply, fill #0
  Filled 2024-03-15 – 2024-03-18 (×2): qty 200, 66d supply, fill #0

## 2024-02-26 ENCOUNTER — Other Ambulatory Visit (HOSPITAL_COMMUNITY): Payer: Self-pay

## 2024-02-28 ENCOUNTER — Other Ambulatory Visit (HOSPITAL_COMMUNITY): Payer: Self-pay

## 2024-02-28 ENCOUNTER — Other Ambulatory Visit: Payer: Self-pay | Admitting: Adult Health

## 2024-02-28 DIAGNOSIS — E119 Type 2 diabetes mellitus without complications: Secondary | ICD-10-CM

## 2024-02-28 MED ORDER — METFORMIN HCL ER 500 MG PO TB24
1000.0000 mg | ORAL_TABLET | Freq: Two times a day (BID) | ORAL | 0 refills | Status: DC
  Filled 2024-02-28 – 2024-03-01 (×2): qty 360, 90d supply, fill #0
  Filled 2024-03-04: qty 120, 30d supply, fill #0
  Filled 2024-07-01: qty 120, 30d supply, fill #1
  Filled 2024-07-05: qty 120, 30d supply, fill #0
  Filled 2024-07-29: qty 120, 30d supply, fill #1

## 2024-02-28 MED ORDER — RAMIPRIL 2.5 MG PO CAPS
2.5000 mg | ORAL_CAPSULE | Freq: Every day | ORAL | 0 refills | Status: DC
Start: 2024-02-28 — End: 2024-04-09
  Filled 2024-02-28: qty 14, 14d supply, fill #0

## 2024-02-28 NOTE — Telephone Encounter (Signed)
 Copied from CRM [id number]. Topic: Clinical - Medication Refill >> Feb 28, 2024  1:29 PM [place] J wrote: Most Recent Primary Care Visit:  Provider: Shirline Frees  Department: [facility]  Visit Type: OFFICE VISIT  Date: 01/09/2024  Medication: metFORMIN (GLUCOPHAGE-XR) 500 MG 24   Has the patient contacted their pharmacy? Yes (Agent: If no, request that the patient contact the pharmacy for the refill. If patient does not wish to contact the pharmacy document the reason why and proceed with request.) (Agent: If yes, when and what did the pharmacy advise?)  Is this the correct pharmacy for this prescription? Yes If no, delete pharmacy and type the correct one.  This is the patient's preferred pharmacy:   [place] [facility] [address] [place] [address] Phone: [phone] Fax: [phone]  Has the prescription been filled recently? No  Is the patient out of the medication? No  Has the patient been seen for an appointment in the last year OR does the patient have an upcoming appointment? Yes  Can we respond through MyChart? Yes  Agent: Please be advised that Rx refills may take up to 3 business days. We ask that you follow-up with your pharmacy.

## 2024-02-28 NOTE — Telephone Encounter (Signed)
 Copied from CRM [id number]. Topic: Clinical - Medication Refill >> Feb 28, 2024  1:27 PM [place] J wrote: Most Recent Primary Care Visit:  Provider: Shirline Frees  Department: [facility]  Visit Type: OFFICE VISIT  Date: 01/09/2024  Medication:  ramipril (ALTACE) 2.5 MG  Has the patient contacted their pharmacy? Yes (Agent: If no, request that the patient contact the pharmacy for the refill. If patient does not wish to contact the pharmacy document the reason why and proceed with request.) (Agent: If yes, when and what did the pharmacy advise?)  Is this the correct pharmacy for this prescription? Yes If no, delete pharmacy and type the correct one.  This is the patient's preferred pharmacy:    [id number] [place], [place] - [address] AT [facility] [address] [facility] [address] [place] [address] Phone: [phone] Fax: [phone]   Has the prescription been filled recently? No  Is the patient out of the medication? Yes  Has the patient been seen for an appointment in the last year OR does the patient have an upcoming appointment? Yes  Can we respond through MyChart? Yes  Agent: Please be advised that Rx refills may take up to 3 business days. We ask that you follow-up with your pharmacy.

## 2024-02-29 ENCOUNTER — Ambulatory Visit: Payer: Self-pay

## 2024-02-29 ENCOUNTER — Other Ambulatory Visit: Payer: Self-pay

## 2024-03-01 ENCOUNTER — Other Ambulatory Visit (HOSPITAL_COMMUNITY): Payer: Self-pay

## 2024-03-01 ENCOUNTER — Other Ambulatory Visit: Payer: Self-pay

## 2024-03-01 MED ORDER — ALENDRONATE SODIUM 70 MG PO TABS
70.0000 mg | ORAL_TABLET | ORAL | 3 refills | Status: DC
  Filled 2024-03-01: qty 12, 84d supply, fill #0
  Filled 2024-03-15: qty 4, 28d supply, fill #0
  Filled 2024-05-06: qty 4, 28d supply, fill #1
  Filled 2024-05-27: qty 4, 28d supply, fill #0
  Filled 2024-05-27: qty 4, 28d supply, fill #2
  Filled 2024-06-27: qty 4, 28d supply, fill #1
  Filled 2024-07-25: qty 4, 28d supply, fill #2

## 2024-03-01 MED ORDER — METFORMIN HCL ER 500 MG PO TB24: 1000.0000 mg | ORAL_TABLET | Freq: Two times a day (BID) | ORAL | 3 refills | Status: DC

## 2024-03-01 MED ORDER — AZELASTINE HCL 137 MCG/SPRAY NA SOLN: 2.0000 | Freq: Two times a day (BID) | NASAL | 0 refills | Status: AC

## 2024-03-01 MED ORDER — METFORMIN HCL ER 500 MG PO TB24
1000.0000 mg | ORAL_TABLET | Freq: Two times a day (BID) | ORAL | 0 refills | Status: DC
  Filled 2024-03-15: qty 120, 30d supply, fill #0
  Filled 2024-05-06: qty 120, 30d supply, fill #1
  Filled 2024-05-27 – 2024-06-06 (×2): qty 120, 30d supply, fill #0

## 2024-03-01 MED ORDER — RAMIPRIL 2.5 MG PO CAPS
2.5000 mg | ORAL_CAPSULE | Freq: Every day | ORAL | 2 refills | Status: DC
  Filled 2024-03-01: qty 90, 90d supply, fill #0
  Filled 2024-05-09: qty 90, 90d supply, fill #1
  Filled 2024-05-27 – 2024-07-05 (×2): qty 90, 90d supply, fill #0

## 2024-03-01 MED ORDER — TRAZODONE HCL 50 MG PO TABS
ORAL_TABLET | ORAL | 2 refills | Status: DC
  Filled 2024-03-01 (×2): qty 90, 90d supply, fill #0
  Filled 2024-03-04 (×2): qty 30, 30d supply, fill #0
  Filled 2024-03-15: qty 30, 30d supply, fill #1
  Filled 2024-05-06: qty 30, 30d supply, fill #2
  Filled 2024-05-27 – 2024-06-06 (×2): qty 30, 30d supply, fill #0
  Filled 2024-06-29: qty 30, 30d supply, fill #1
  Filled 2024-07-29 – 2024-10-15 (×19): qty 30, 30d supply, fill #2

## 2024-03-04 ENCOUNTER — Other Ambulatory Visit: Payer: Self-pay

## 2024-03-06 ENCOUNTER — Ambulatory Visit (INDEPENDENT_AMBULATORY_CARE_PROVIDER_SITE_OTHER): Admitting: *Deleted

## 2024-03-06 DIAGNOSIS — J309 Allergic rhinitis, unspecified: Secondary | ICD-10-CM

## 2024-03-07 ENCOUNTER — Other Ambulatory Visit (HOSPITAL_COMMUNITY): Payer: Self-pay

## 2024-03-12 ENCOUNTER — Ambulatory Visit (INDEPENDENT_AMBULATORY_CARE_PROVIDER_SITE_OTHER): Admitting: *Deleted

## 2024-03-12 DIAGNOSIS — J309 Allergic rhinitis, unspecified: Secondary | ICD-10-CM | POA: Diagnosis not present

## 2024-03-14 ENCOUNTER — Ambulatory Visit: Payer: Medicare HMO | Admitting: Neurology

## 2024-03-14 ENCOUNTER — Encounter: Payer: Self-pay | Admitting: Neurology

## 2024-03-14 ENCOUNTER — Other Ambulatory Visit (HOSPITAL_COMMUNITY): Payer: Self-pay

## 2024-03-14 VITALS — BP 138/64 | HR 69 | Ht 64.0 in | Wt 136.0 lb

## 2024-03-14 DIAGNOSIS — G250 Essential tremor: Secondary | ICD-10-CM

## 2024-03-14 DIAGNOSIS — M5416 Radiculopathy, lumbar region: Secondary | ICD-10-CM

## 2024-03-14 DIAGNOSIS — E1142 Type 2 diabetes mellitus with diabetic polyneuropathy: Secondary | ICD-10-CM | POA: Diagnosis not present

## 2024-03-14 DIAGNOSIS — Z7984 Long term (current) use of oral hypoglycemic drugs: Secondary | ICD-10-CM

## 2024-03-14 MED ORDER — GABAPENTIN 300 MG PO CAPS
600.0000 mg | ORAL_CAPSULE | Freq: Three times a day (TID) | ORAL | 3 refills | Status: AC
  Filled 2024-03-14 – 2024-03-15 (×2): qty 540, 90d supply, fill #0
  Filled 2024-03-15 (×3): qty 180, 30d supply, fill #0
  Filled 2024-05-06: qty 180, 30d supply, fill #1
  Filled 2024-05-27 – 2024-06-06 (×2): qty 180, 30d supply, fill #0
  Filled 2024-06-29: qty 180, 30d supply, fill #1
  Filled 2024-07-29 – 2024-09-03 (×13): qty 180, 30d supply, fill #2
  Filled 2024-10-07 – 2024-10-15 (×2): qty 180, 30d supply, fill #3
  Filled 2024-11-05 – 2024-11-07 (×2): qty 180, 30d supply, fill #4
  Filled 2024-12-06: qty 180, 30d supply, fill #5
  Filled 2025-01-05: qty 180, 30d supply, fill #6

## 2024-03-14 NOTE — Progress Notes (Unsigned)
   03/14/2024  Patient ID: Kimberly Lam, female   DOB: 1946-08-21, 78 y.o.   MRN: [id number]  Pharmacy Quality Measure Review  This patient is appearing on a report for being at risk of failing the adherence measure for hypertension (ACEi/ARB) medications this calendar year.   Medication: Ramipril 2.5mg  Last fill date: 03/01/24 for 90 day supply, also enrolled in packaging  Insurance report was not up to date. No action needed at this time.   Sherrill Raring, PharmD Clinical Pharmacist [phone]

## 2024-03-14 NOTE — Progress Notes (Addendum)
 ASSESSMENT AND PLAN 78 y.o. year old female  Peripheral neuropathy, chronic low back pain,  Essential tremor   MRI of the lumbar in October 2016, transitional L6-S1 vertebral body, 8 mm anterolisthesis of L5 upon S1 with severe facet hypertrophy, mild spinal stenosis and moderately severe right foraminal stenosis, with potential compression of right L5 nerve roots, degenerative changes of S1 and S2 with severe right foraminal stenosis, potential compression of exiting right S1 nerve roots,  EMG nerve conduction study from 2016 by Dr. Evelina Bucy neurologist showed mild axonal sensorimotor polyneuropathy, superimposed to chronic bilateral lumbar radiculopathy  Her low back pain responded very well to epidural injection  Gabapentin 300 mg up to 2 tablets 3 times a day also helpful  Essential tremor  slow worsening over the years, no parkinsonian features, tremor poses no limitation in her daily activity, normal thyroid functional test, no medication needed  Continue follow-up with primary care only return to clinic for new issues  DIAGNOSTIC DATA (LABS, IMAGING, TESTING) - I reviewed patient records, labs, notes, testing and imaging myself where available.     HISTORY: Kimberly Lam is a 78 years old female, seen in refer by her primary care nurse practitioner Shirline Frees for evaluation of leg cramping, tremors, initial evaluation was on January 23, 2018.     She has past medical history of hypertension, diabetes, hyperlipidemia   I saw her initially in 2016 for evaluation of low back pain, radiating pain to bilateral hip, bilateral leg muscle cramping, and weakness.   She has history of low back pain since 2006, midline low back, occasionally go to posterior thigh, go down below her knee, worsening with prolonged standing, or walking,   Previous her low back pain has responded very well to epidural injection, but the benefit gradually fades away, over the years, she  complains of worsening low back pain, radiating pain to bilateral lower extremity again, in addition, she complains of bilateral feet paresthesia,    She had a history of diabetes, she denies gait difficulty, no bowel and bladder incontinence, she denies bilateral upper extremity paresthesia or weakness.    EMG nerve conduction study by Dr. Allena Katz from [facility] in November 2016, showed evidence of mild axonal peripheral neuropathy, in addition there was evidence of chronic mild bilateral L5 radiculopathy.   Note from Dr. Harmon Pier, [facility], [place] in 2017, she was diagnosed with degenerative spondylolisthesis L4-L5, L5-S1, neurogenic claudication, low back pain, received epidural injection in Feb 2015, benefit lasted about one year   MRI of lumbar spine December 2016, There is a transitional L6/S1 vertebral body. 8 mm of anterolisthesis of L5 upon L6/S1 of a degenerative nature due to severe facet hypertrophy. At this level, there is mild spinal stenosis and moderately severe right foraminal narrowing. There could be compression of the right L5 nerve root.  Degenerative changes at L6/S1-S2 lead to severe right foraminal narrowing that could lead to compression of the exiting right S1 nerve root  Her low back pain has much improved with epidural injection, also helped by gabapentin up to 300 mg 2 tablets 3 times a day, gabapentin also helped her muscle cramps,  She remains physically active, denied gait abnormality, denies bowel and bladder incontinence  Her mother has bilateral hands tremor, over the past few years, she noticed mild bilateral hands tremor, no limitation in her daily activity, normal TSH in September 2024  REVIEW OF SYSTEMS: Out of a complete 14 system review of symptoms, the  patient complains only of the following symptoms, and all other reviewed systems are negative.  See HPI  PHYSICAL EXAM  Vitals:   06/13/23 1315  BP: 134/60  Pulse: 70  Weight: 139 lb (63 kg)   Height: 5\' 4"  (1.626 m)    Body mass index is 23.86 kg/m.  Generalized: Well developed, in no acute distress     NEUROLOGICAL EXAM:  MENTAL STATUS: Speech/Cognition: Awake, alert, normal speech, oriented to history taking and casual conversation.  CRANIAL NERVES: CN II: Visual fields are full to confrontation.  Pupils are round equal and briskly reactive to light. CN III, IV, VI: extraocular movement are normal. No ptosis. CN V: Facial sensation is intact to light touch. CN VII: Face is symmetric with normal eye closure and smile. CN VIII: Hearing is normal to casual conversation CN IX, X: Palate elevates symmetrically. Phonation is normal. CN XI: Head turning and shoulder shrug are intact CN XII: Tongue is midline with normal movements and no atrophy.  MOTOR: Normal strength, no bradykinesia, rigidity, slight difficulty drawing spiral circle  REFLEXES: Reflexes are 1  and symmetric at the biceps, triceps, knees and ankles. Plantar responses are flexor.  SENSORY: Intact to light touch, pinprick, positional and vibratory sensation at fingers and toes.  COORDINATION: There is no trunk or limb ataxia.    GAIT/STANCE: Posture is normal. Gait is steady with normal steps, base, arm swing and turning.    ALLERGIES: Allergies  Allergen Reactions   Peanut-Containing Drug Products Anaphylaxis   Apple     Pt stated it " causes sinuses to swell and immflamation"    Egg-Derived Products Nausea Only    Nausea only- can eat cakes with eggs    Milk-Related Compounds Nausea And Vomiting    Milk and cheese *Can drink 1 cup a day*   Actos [Pioglitazone] Nausea Only   Soy Allergy (Obsolete) Nausea Only    Pt states had allergy testing and was told allergic to soy-causes nausea but no other reactions   Vicodin [Hydrocodone-Acetaminophen] Nausea Only    HOME MEDICATIONS: Outpatient Medications Prior to Visit  Medication Sig Dispense Refill   AIRSUPRA 90-80 MCG/ACT AERO Inhale 2  puffs into the lungs every 6 (six) hours as needed. 32.1 g 1   albuterol (VENTOLIN HFA) 108 (90 Base) MCG/ACT inhaler Inhale 2 puffs into the lungs every 4 (four) hours as needed for wheezing or shortness of breath (coughing fits). 18 g 1   Alcohol Swabs (DROPSAFE ALCOHOL PREP) 70 % PADS USE TO TEST BLOOD GLUCOSE FOUR TIMES DAILY AS DIRECTED 400 each 10   alendronate (FOSAMAX) 70 MG tablet Take 1 tablet (70 mg total) by mouth once a week WITH 8 OZ OF PLAIN WATER 30 MINUTES BEFORE FIRST FOOD, DRINK OR MEDS. STAY UPRIGHT FOR 30 MINUTES. 12 tablet 3   alendronate (FOSAMAX) 70 MG tablet Take 1 tablet (70 mg total) by mouth once a week with 8 oz of plain water, 30 minutes before first food, drink or medication. Stay upright for 30 minutes. 12 tablet 3   Ascorbic Acid (VITAMIN C) 1000 MG tablet Take 1,000 mg by mouth daily.     azelastine (ASTELIN) 0.1 % nasal spray Place 2 sprays into both nostrils 2 (two) times daily. Use in each nostril as directed 90 mL 1   Azelastine HCl 137 MCG/SPRAY SOLN Place 2 sprays into both nostrils 2 (two) times daily. 120 mL 0   Barberry-Oreg Grape-Goldenseal (BERBERINE COMPLEX PO) Take 1 capsule by mouth daily.  Blood Glucose Monitoring Suppl (ONETOUCH VERIO REFLECT) w/Device KIT Use to check blood sugar TID 1 kit 0   CINNAMON PO Take 1 tablet by mouth as needed (for blood glucose).     Cranberry 450 MG CAPS Take 450 mg by mouth daily.     Cyanocobalamin (VITAMIN B-12 CR) 1500 MCG TBCR Take 1,500 mcg by mouth daily.     Echinacea-Goldenseal (ECHINACEA COMB/GOLDEN SEAL PO) Take 1 tablet by mouth as needed (for prevention). Reported on 12/16/2015     EPINEPHrine 0.3 mg/0.3 mL IJ SOAJ injection Inject 0.3 mg into the muscle as needed for anaphylaxis. 6 each 1   fluticasone (FLONASE) 50 MCG/ACT nasal spray Place 2 sprays into both nostrils daily. 48 g 0   gabapentin (NEURONTIN) 300 MG capsule Take 1 capsule (300 mg total) by mouth 3 (three) times daily. 270 capsule 3    glipiZIDE (GLUCOTROL XL) 10 MG 24 hr tablet Take 1 tablet (10 mg total) by mouth daily with breakfast. 90 tablet 0   glucose blood (ONETOUCH VERIO) test strip Use to check blood sugar three times a day. 200 each 12   Green Tea, Camillia sinensis, (GREEN TEA PO) Take 1 tablet by mouth as needed (for wellbeing). Reported on 12/16/2015     Lactobacillus (ACIDOPHILUS PROBIOTIC PO) Take 1 tablet by mouth daily.     Lancets (ONETOUCH DELICA PLUS LANCET33G) MISC Use as directed to check blood sugar. 300 each 12   levocetirizine (XYZAL) 5 MG tablet Take 1 tablet (5 mg total) by mouth every evening. 30 tablet 11   meclizine (ANTIVERT) 25 MG tablet Take 1 tablet (25 mg total) by mouth 3 (three) times daily as needed for dizziness. 30 tablet 0   Melatonin ER 5 MG TBCR Take 5 mg by mouth at bedtime as needed (FOR SLEEP).     metFORMIN (GLUCOPHAGE-XR) 500 MG 24 hr tablet Take 2 tablets (1,000 mg total) by mouth 2 (two) times daily. 360 tablet 0   metFORMIN (GLUCOPHAGE-XR) 500 MG 24 hr tablet Take 2 tablets (1,000 mg total) by mouth in the morning and at bedtime. 360 tablet 3   metFORMIN (GLUCOPHAGE-XR) 500 MG 24 hr tablet Take 2 tablets (1,000 mg total) by mouth 2 (two) times daily. 360 tablet 0   montelukast (SINGULAIR) 10 MG tablet Take 1 tablet (10 mg total) by mouth at bedtime. 90 tablet 3   Omega-3 Fatty Acids (OMEGA 3 PO) Take 1 tablet by mouth as needed.     omeprazole (PRILOSEC) 40 MG capsule Take 1 capsule (40 mg total) by mouth daily. 90 capsule 3   Polyethyl Glycol-Propyl Glycol (SYSTANE FREE OP) Apply 1 drop to eye daily. Uses thera work for dr eyes per pt     Potassium 95 MG TABS Take 95 mg by mouth daily.     ramipril (ALTACE) 2.5 MG capsule Take 1 capsule (2.5 mg total) by mouth daily. 14 capsule 0   ramipril (ALTACE) 2.5 MG capsule Take 1 capsule (2.5 mg total) by mouth daily. 90 capsule 2   simvastatin (ZOCOR) 20 MG tablet Take 1 tablet (20 mg total) by mouth daily. 90 tablet 3   Stinging  Nettle 2 % POWD Take 1 tablet by mouth daily at 12 noon. Reported on 12/16/2015     sucralfate (CARAFATE) 1 GM/10ML suspension Take 10 mLs (1 g total) by mouth 4 (four) times daily -  with meals and at bedtime. 420 mL 2   traZODone (DESYREL) 50 MG tablet TAKE 1/2 TO  1 TABLET AT BEDTIME AS NEEDED FOR SLEEP 90 tablet 3   traZODone (DESYREL) 50 MG tablet Take 0.5-1 tablets (25-50 mg total) by mouth at bedtime. 90 tablet 2   TURMERIC PO Take 800 mg by mouth daily as needed (for well being). Reported on 12/16/2015     No facility-administered medications prior to visit.    PAST MEDICAL HISTORY: Past Medical History:  Diagnosis Date   Acid reflux    Allergic rhinitis    Allergy    Anemia    Arthritis    per MD- pt states no s/s of this    Asthma    Bilateral sciatica    Cataract    removed both eyes    Chronic bilateral low back pain    Colon polyps    Diverticulosis    DM type 2 (diabetes mellitus, type 2) (HCC)    Environmental allergies    flowers, pollen, trees   GERD (gastroesophageal reflux disease)    Hyperlipidemia    Low back pain    Migraine    past hx- control as allergies are controlled    Osteoporosis    PONV (postoperative nausea and vomiting)     PAST SURGICAL HISTORY: Past Surgical History:  Procedure Laterality Date   CATARACT EXTRACTION, BILATERAL Bilateral 2012   CESAREAN SECTION     x 2   COLONOSCOPY     > 10 yrs ago- unsure if removed polyps as was told sev.different things after this colon    DENTAL SURGERY     molars removed    DILATION AND CURETTAGE OF UTERUS     NASAL SINUS SURGERY     TUBAL LIGATION      FAMILY HISTORY: Family History  Problem Relation Age of Onset   Asthma Father    Emphysema Father    Alcohol abuse Father    Diabetes Mellitus II Brother        x 2   Hyperlipidemia Brother    Hypertension Brother    Pancreatic cancer Brother    Prostate cancer Brother    Diabetes Brother    Heart disease Maternal Grandmother         hardening of the arteries   Heart murmur Daughter    Glaucoma Brother    Breast cancer Neg Hx    Colon cancer Neg Hx    Colon polyps Neg Hx    Esophageal cancer Neg Hx    Rectal cancer Neg Hx    Stomach cancer Neg Hx     SOCIAL HISTORY: Social History   Socioeconomic History   Marital status: Married    Spouse name: Not on file   Number of children: 2   Years of education: 16   Highest education level: Bachelor's degree (e.g., BA, AB, BS)  Occupational History   Occupation: Retired  Tobacco Use   Smoking status: Never   Smokeless tobacco: Never  Vaping Use   Vaping status: Never Used  Substance and Sexual Activity   Alcohol use: No    Alcohol/week: 0.0 standard drinks of alcohol   Drug use: No   Sexual activity: Not on file  Other Topics Concern   Not on file  Social History Narrative      Married 1 son one daughter, relocated to [place] after living in [place] for many years. 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   No caffeine.          Lives at home  with her husband and brother.   Right-handed.   No caffeine use.   Social Drivers of Corporate investment banker Strain: Low Risk  (09/20/2023)   Overall Financial Resource Strain (CARDIA)    Difficulty of Paying Living Expenses: Not hard at all  Food Insecurity: No Food Insecurity (09/20/2023)   Hunger Vital Sign    Worried About Running Out of Food in the Last Year: Never true    Ran Out of Food in the Last Year: Never true  Transportation Needs: No Transportation Needs (09/20/2023)   PRAPARE - Administrator, Civil Service (Medical): No    Lack of Transportation (Non-Medical): No  Physical Activity: Insufficiently Active (09/20/2023)   Exercise Vital Sign    Days of Exercise per Week: 4 days    Minutes of Exercise per Session: 30 min  Stress: No Stress Concern Present (09/20/2023)   [employer] of Occupational Health - Occupational Stress Questionnaire    Feeling of Stress  : Not at all  Recent Concern: Stress - Stress Concern Present (08/31/2023)   [employer] of Occupational Health - Occupational Stress Questionnaire    Feeling of Stress : To some extent  Social Connections: Socially Integrated (09/20/2023)   Social Connection and Isolation Panel [NHANES]    Frequency of Communication with Friends and Family: More than three times a week    Frequency of Social Gatherings with Friends and Family: More than three times a week    Attends Religious Services: More than 4 times per year    Active Member of [employer] or Organizations: Yes    Attends Banker Meetings: More than 4 times per year    Marital Status: Married  Catering manager Violence: Not At Risk (09/20/2023)   Humiliation, Afraid, Rape, and Kick questionnaire    Fear of Current or Ex-Partner: No    Emotionally Abused: No    Physically Abused: No    Sexually Abused: No    Levert Feinstein, M.D. Ph.D.  [facility] [address] [place], [place] [address] Phone: [phone] Fax:      [phone]

## 2024-03-15 ENCOUNTER — Other Ambulatory Visit: Payer: Self-pay

## 2024-03-15 ENCOUNTER — Other Ambulatory Visit (HOSPITAL_BASED_OUTPATIENT_CLINIC_OR_DEPARTMENT_OTHER): Payer: Self-pay

## 2024-03-15 ENCOUNTER — Other Ambulatory Visit (HOSPITAL_COMMUNITY): Payer: Self-pay

## 2024-03-18 ENCOUNTER — Other Ambulatory Visit: Payer: Self-pay

## 2024-03-18 ENCOUNTER — Telehealth: Payer: Self-pay | Admitting: Adult Health

## 2024-03-18 ENCOUNTER — Other Ambulatory Visit (HOSPITAL_COMMUNITY): Payer: Self-pay

## 2024-03-18 NOTE — Telephone Encounter (Signed)
 Pt is requesting a call back.  She has new insurance.  She is wanting to request a new supply of some medications but is wanting a call back from Cory's nurse.  She dropped off a letter for Kendra/Cory.  It is in the folder up front. Marland Kitchen

## 2024-03-19 ENCOUNTER — Other Ambulatory Visit: Payer: Self-pay

## 2024-03-19 ENCOUNTER — Ambulatory Visit (INDEPENDENT_AMBULATORY_CARE_PROVIDER_SITE_OTHER): Payer: Self-pay | Admitting: *Deleted

## 2024-03-19 DIAGNOSIS — J309 Allergic rhinitis, unspecified: Secondary | ICD-10-CM | POA: Diagnosis not present

## 2024-03-19 MED ORDER — ACCU-CHEK GUIDE W/DEVICE KIT: PACK | 0 refills | Status: AC

## 2024-03-19 MED ORDER — ACCU-CHEK FASTCLIX LANCETS MISC
1 refills | Status: AC
Start: 2024-03-19 — End: ?

## 2024-03-19 MED ORDER — ONETOUCH VERIO VI STRP: ORAL_STRIP | 0 refills | Status: AC

## 2024-03-19 MED ORDER — ACCU-CHEK GUIDE TEST VI STRP: ORAL_STRIP | 12 refills | Status: AC

## 2024-03-19 NOTE — Telephone Encounter (Signed)
 Spoke to pt and she stated that her strips need to note that it says TID not one a day. Pt is wanting a new meter (accu check guide). Pt stated that insurance will not pay for it unless it says TID.

## 2024-03-19 NOTE — Telephone Encounter (Signed)
 Noted.

## 2024-03-20 ENCOUNTER — Telehealth: Payer: Self-pay

## 2024-03-20 NOTE — Telephone Encounter (Signed)
 Copied from CRM [id number]. Topic: Clinical - Prescription Issue >> Mar 20, 2024  4:09 PM Melissa C wrote: Reason for CRM: patient stated that pharmacy called regarding glucose blood (ACCU-CHEK GUIDE TEST) test strip and glucose blood (ONETOUCH VERIO) test strip. They stated pre-authorization was needed. They gave patient reference number of [id number]. Please advise with patient's pharmacy  [id number] [place], [place] - [address] AT [facility] [address] [facility] [address] [facility] [address] [place] [address] Phone: [phone]  Fax: [phone]  Thank you

## 2024-03-20 NOTE — Telephone Encounter (Signed)
 This has been taking care of. Meter and strips called into the pharmacy. Pt is aware of update.

## 2024-03-21 ENCOUNTER — Telehealth: Payer: Self-pay

## 2024-03-21 ENCOUNTER — Other Ambulatory Visit (HOSPITAL_COMMUNITY): Payer: Self-pay

## 2024-03-21 ENCOUNTER — Telehealth: Payer: Self-pay | Admitting: *Deleted

## 2024-03-21 NOTE — Telephone Encounter (Signed)
 Copied from CRM [id number]. Topic: Referral - Prior Authorization Question >> Mar 21, 2024  3:40 PM Gurney Maxin H wrote: Reason for CRM: Samuel Bouche with [employer] is calling regarding patients Blood Glucose Monitoring Suppl (ACCU-CHEK GUIDE) w/Device KIT, states has member tried the plan preferred brand One Touch and needs diagnosis, will be faxing over form that needs to be completed.   Samuel Bouche  [phone] option #2

## 2024-03-21 NOTE — Telephone Encounter (Signed)
 Pharmacy Patient Advocate Encounter   Received notification from Pt Calls Messages that prior authorization for ACCU-CHEK GUIDE TEST STRIPS is required/requested.   Insurance verification completed.   The patient is insured through [facility] ADVANTAGE/RX ADVANCE .   Per test claim: PA required; PA submitted to above mentioned insurance via Fax Key/confirmation #/EOC [id number] Status is pending    FAX: [phone] PHONE: [phone] (*2)

## 2024-03-21 NOTE — Telephone Encounter (Signed)
 Pharmacy Patient Advocate Encounter   Received notification from Pt Calls Messages that prior authorization for ONE TOUCH VERIO TEST STRIPS is required/requested.   Insurance verification completed.   The patient is insured through [facility] ADVANTAGE/RX ADVANCE .   Per test claim: Refill too soon. PA is not needed at this time. Medication was filled 01/30/24. Next eligible fill date is 04/07/24.

## 2024-03-21 NOTE — Telephone Encounter (Addendum)
 Per test claim: Refill too soon. PA is not needed at this time. Medication was filled 01/30/24. Next eligible fill date is 04/07/24.   One touch Verio was filled at [facility] on 01/30/24. Insurance will cover test strips again on 04/07/24.

## 2024-03-21 NOTE — Telephone Encounter (Signed)
 Pt is trying to get the Accu-chek guide meter and strips. Did a PA come in for that meter and strips?

## 2024-03-21 NOTE — Telephone Encounter (Signed)
 PA request has been Submitted. New Encounter has been or will be created for follow up. For additional info see Pharmacy Prior Auth telephone encounter from 03/21/24.

## 2024-03-21 NOTE — Telephone Encounter (Signed)
Can someone assist with this please?

## 2024-03-22 NOTE — Telephone Encounter (Signed)
 Noted.

## 2024-03-22 NOTE — Telephone Encounter (Signed)
 Spoke to another representative from insurance. Representative stated they have all documents needed and will fax approval or denial by 03/24/2024. Left detailed message informing pt of update.

## 2024-03-25 ENCOUNTER — Other Ambulatory Visit (HOSPITAL_COMMUNITY): Payer: Self-pay

## 2024-03-25 ENCOUNTER — Other Ambulatory Visit: Payer: Self-pay | Admitting: Adult Health

## 2024-03-25 ENCOUNTER — Other Ambulatory Visit: Payer: Self-pay

## 2024-03-26 NOTE — Telephone Encounter (Signed)
 Do you know if the meter was approved as well?

## 2024-03-26 NOTE — Telephone Encounter (Signed)
 Pharmacy Patient Advocate Encounter  Received notification from [facility] ADVANT[employer]/RX ADVANCE that Prior Authorization for ACCU-CHEK GUIDE TEST STRIPS  has been DENIED.  Full denial letter will be uploaded to the media tab. See denial reason below.   PA #/Case ID/Reference #: [id number]   DENIED under Medicare Part D, APPROVED under Medicare Part B

## 2024-03-27 ENCOUNTER — Other Ambulatory Visit (HOSPITAL_COMMUNITY): Payer: Self-pay

## 2024-03-27 NOTE — Telephone Encounter (Signed)
 It did not say in the denial letter if the meter was covered as well. Most insurances cover everything if one part is approved. Since its Part B, I cannot do a test claim on it. If PA is needed, please let the PA team know and we can submit another PA.

## 2024-03-27 NOTE — Telephone Encounter (Signed)
 Patient notified of update  and verbalized understanding.

## 2024-04-01 ENCOUNTER — Telehealth: Payer: Self-pay | Admitting: Adult Health

## 2024-04-01 ENCOUNTER — Other Ambulatory Visit (HOSPITAL_COMMUNITY): Payer: Self-pay

## 2024-04-01 NOTE — Telephone Encounter (Unsigned)
 Copied from CRM [id number]. Topic: Clinical - Medication Question >> Apr 01, 2024 11:55 AM Danae Duncans wrote: Reason for CRM: Robin( Patient Nurse Case Manager) at [employer] advise pt sugar dropped to 59 sometimes 70 the past week, pt is still taking prescribe meds but want to know if med need to be adjusted, pt also made request she would like a raised toilet seat order- Callback number for robin is : [phone]

## 2024-04-02 ENCOUNTER — Encounter (INDEPENDENT_AMBULATORY_CARE_PROVIDER_SITE_OTHER): Payer: Self-pay

## 2024-04-02 ENCOUNTER — Ambulatory Visit (INDEPENDENT_AMBULATORY_CARE_PROVIDER_SITE_OTHER): Payer: PPO | Admitting: Otolaryngology

## 2024-04-02 VITALS — BP 123/66 | HR 76 | Ht 64.0 in | Wt 133.0 lb

## 2024-04-02 DIAGNOSIS — J342 Deviated nasal septum: Secondary | ICD-10-CM | POA: Diagnosis not present

## 2024-04-02 DIAGNOSIS — J329 Chronic sinusitis, unspecified: Secondary | ICD-10-CM | POA: Diagnosis not present

## 2024-04-02 DIAGNOSIS — Z9889 Other specified postprocedural states: Secondary | ICD-10-CM | POA: Diagnosis not present

## 2024-04-02 DIAGNOSIS — J3489 Other specified disorders of nose and nasal sinuses: Secondary | ICD-10-CM | POA: Diagnosis not present

## 2024-04-02 DIAGNOSIS — R0981 Nasal congestion: Secondary | ICD-10-CM

## 2024-04-02 DIAGNOSIS — H938X3 Other specified disorders of ear, bilateral: Secondary | ICD-10-CM

## 2024-04-02 DIAGNOSIS — H6123 Impacted cerumen, bilateral: Secondary | ICD-10-CM

## 2024-04-02 DIAGNOSIS — J343 Hypertrophy of nasal turbinates: Secondary | ICD-10-CM

## 2024-04-02 NOTE — Progress Notes (Addendum)
 Dear Dr. Burdette Carolin, Here is my assessment for our mutual patient, Kimberly Lam. Thank you for allowing me the opportunity to care for your patient. Please do not hesitate to contact me should you have any other questions. Sincerely, Dr. Milon Aloe  Otolaryngology Clinic Note  HISTORY: Kimberly Lam is a 78 y.o. female kindly referred by Dr. Burdette Carolin for evaluation of septal perforation.  Initial visit (03/2024): She reports that she has noted a septal perforation for several years, she does not know exactly when it happened but may have been after a nasal surgery which was done for her breathing years ago. Current symptoms include nasal dryness, whistling, sensation of flapping. She does pick at it which causes intermittent bleeding. She denies any pain, numbness of face. She does report chronic nasal congestion as well, one side is not worse than the other side. Symptoms worse when it is warm. No frequent sinus infections -- no freq abx/steroids. She is on AIT, and and also using astelin  and flonase . PO antihistamine. Denies nasal congestant use. Also using regular saline rinses - every other day. Improvement occurred with rinses and spray helps temporarily.  Allergy testing has been done.   No AI symptoms including rash, fevers, shortness of breath, kidney problems, night sweats, joint pain. Never used cocaine or any other drugs. Perforation has never been biopsied.  Of note, she also reports issues with bilateral cerumen. Intermittent fullness, but denies pain, drainage from ears, vertigo, tinnitus or hearing change. She does not have history of frequent ear infections. She would like to have her ears cleaned of the cerumen.  GLP-1: no AP/AC: no  Tobacco: no  PMHx: DM, THN, GERD, AR, Neuropathy on gabapentin   RADIOGRAPHIC EVALUATION AND INDEPENDENT REVIEW OF OTHER RECORDS: Dr. Marlis Simper Notes (Allergy) 01/22/2024 - noted nasal septal perforation, allergic rhinitis, GERD, Asthma; asthma well controlled, on  nasal sprays and meds; no ENT visit recently, noted need for ear cleaning due to wax; Dx: Seasonal AR, Septal perforation, ceruminosis; Rx: Ref to ENT, on AIT, singulair , flonase , astelin , zyrtec Dr. Tellis Feathers ([facility]) 09/21/2022: noted sinus infection and difficulty breathing through nose; some whistling; did have surgery > 10 years ago (septoplasty?) small anterior septal perforation; Dx: Turbinate hypertrophy, septal perforation; Rx: turbinate reduction and septal perforation repair CBC and CMP 09/08/2023: WBC 4.9, Hgb 12.9, Plt 226; Eos 300; BUN/Cr 12/0.85; TSH wnl RAST 10.06/2023: essentially negative on review, IgE 155; borderline pos Elm and Ragweed  Past Medical History:  Diagnosis Date   Acid reflux    Allergic rhinitis    Allergy    Anemia    Arthritis    per MD- pt states no s/s of this    Asthma    Bilateral sciatica    Cataract    removed both eyes    Chronic bilateral low back pain    Colon polyps    Diverticulosis    DM type 2 (diabetes mellitus, type 2) (HCC)    Environmental allergies    flowers, pollen, trees   GERD (gastroesophageal reflux disease)    Hyperlipidemia    Low back pain    Migraine    past hx- control as allergies are controlled    Osteoporosis    PONV (postoperative nausea and vomiting)    Past Surgical History:  Procedure Laterality Date   CATARACT EXTRACTION, BILATERAL Bilateral 2012   CESAREAN SECTION     x 2   COLONOSCOPY     > 10 yrs ago- unsure if removed polyps as  was told sev.different things after this colon    DENTAL SURGERY     molars removed    DILATION AND CURETTAGE OF UTERUS     NASAL SINUS SURGERY     TUBAL LIGATION     Family History  Problem Relation Age of Onset   Asthma Father    Emphysema Father    Alcohol  abuse Father    Diabetes Mellitus II Brother        x 2   Hyperlipidemia Brother    Hypertension Brother    Pancreatic cancer Brother    Prostate cancer Brother    Diabetes Brother    Heart disease Maternal  Grandmother        hardening of the arteries   Heart murmur Daughter    Glaucoma Brother    Breast cancer Neg Hx    Colon cancer Neg Hx    Colon polyps Neg Hx    Esophageal cancer Neg Hx    Rectal cancer Neg Hx    Stomach cancer Neg Hx    Social History   Tobacco Use   Smoking status: Never   Smokeless tobacco: Never  Substance Use Topics   Alcohol  use: No    Alcohol /week: 0.0 standard drinks of alcohol    Allergies  Allergen Reactions   Peanut -Containing Drug Products Anaphylaxis   Apple     Pt stated it " causes sinuses to swell and immflamation"    Egg-Derived Products Nausea Only    Nausea only- can eat cakes with eggs    Milk-Related Compounds Nausea And Vomiting    Milk and cheese *Can drink 1 cup a day*   Actos [Pioglitazone] Nausea Only   Soy Allergy (Obsolete) Nausea Only    Pt states had allergy testing and was told allergic to soy-causes nausea but no other reactions   Vicodin [Hydrocodone-Acetaminophen] Nausea Only   Current Outpatient Medications  Medication Sig Dispense Refill   Accu-Chek FastClix Lancets MISC Use to check blood glucose TID 102 each 1   AIRSUPRA  90-80 MCG/ACT AERO Inhale 2 puffs into the lungs every 6 (six) hours as needed. 32.1 g 1   albuterol  (VENTOLIN  HFA) 108 (90 Base) MCG/ACT inhaler Inhale 2 puffs into the lungs every 4 (four) hours as needed for wheezing or shortness of breath (coughing fits). 18 g 1   Alcohol  Swabs (DROPSAFE ALCOHOL  PREP) 70 % PADS USE TO TEST BLOOD GLUCOSE FOUR TIMES DAILY AS DIRECTED 400 each 10   alendronate  (FOSAMAX ) 70 MG tablet Take 1 tablet (70 mg total) by mouth once a week WITH 8 OZ OF PLAIN WATER 30 MINUTES BEFORE FIRST FOOD, DRINK OR MEDS. STAY UPRIGHT FOR 30 MINUTES. 12 tablet 3   alendronate  (FOSAMAX ) 70 MG tablet Take 1 tablet (70 mg total) by mouth once a week with 8 oz of plain water, 30 minutes before first food, drink or medication. Stay upright for 30 minutes. 12 tablet 3   Ascorbic Acid (VITAMIN C)  1000 MG tablet Take 1,000 mg by mouth daily.     azelastine  (ASTELIN ) 0.1 % nasal spray Place 2 sprays into both nostrils 2 (two) times daily. Use in each nostril as directed 90 mL 1   Azelastine  HCl 137 MCG/SPRAY SOLN Place 2 sprays into both nostrils 2 (two) times daily. 120 mL 0   Barberry-Oreg Grape-Goldenseal (BERBERINE COMPLEX PO) Take 1 capsule by mouth daily.     Blood Glucose Monitoring Suppl (ACCU-CHEK GUIDE) w/Device KIT Use to check glucose TID 1 kit 0  CINNAMON PO Take 1 tablet by mouth as needed (for blood glucose).     Cranberry 450 MG CAPS Take 450 mg by mouth daily.     Cyanocobalamin (VITAMIN B-12 CR) 1500 MCG TBCR Take 1,500 mcg by mouth daily.     Echinacea-Goldenseal (ECHINACEA COMB/GOLDEN SEAL PO) Take 1 tablet by mouth as needed (for prevention). Reported on 12/16/2015     EPINEPHrine  0.3 mg/0.3 mL IJ SOAJ injection Inject 0.3 mg into the muscle as needed for anaphylaxis. 6 each 1   fluticasone  (FLONASE ) 50 MCG/ACT nasal spray Place 2 sprays into both nostrils daily. 48 g 0   gabapentin  (NEURONTIN ) 300 MG capsule Take 2 capsules (600 mg total) by mouth 3 (three) times daily. 540 capsule 3   glucose blood (ACCU-CHEK GUIDE TEST) test strip Use as instructed 100 each 12   glucose blood (ONETOUCH VERIO) test strip Use to check blood sugar three times a day. 200 each 0   Green Tea, Camillia sinensis, (GREEN TEA PO) Take 1 tablet by mouth as needed (for wellbeing). Reported on 12/16/2015     Lactobacillus (ACIDOPHILUS PROBIOTIC PO) Take 1 tablet by mouth daily.     Lancets (ONETOUCH DELICA PLUS LANCET33G) MISC Use as directed to check blood sugar. 300 each 12   levocetirizine (XYZAL ) 5 MG tablet Take 1 tablet (5 mg total) by mouth every evening. 30 tablet 11   meclizine  (ANTIVERT ) 25 MG tablet Take 1 tablet (25 mg total) by mouth 3 (three) times daily as needed for dizziness. 30 tablet 0   Melatonin ER 5 MG TBCR Take 5 mg by mouth at bedtime as needed (FOR SLEEP).     metFORMIN   (GLUCOPHAGE -XR) 500 MG 24 hr tablet Take 2 tablets (1,000 mg total) by mouth 2 (two) times daily. 360 tablet 0   metFORMIN  (GLUCOPHAGE -XR) 500 MG 24 hr tablet Take 2 tablets (1,000 mg total) by mouth 2 (two) times daily. 360 tablet 0   montelukast  (SINGULAIR ) 10 MG tablet Take 1 tablet (10 mg total) by mouth at bedtime. 90 tablet 3   Omega-3 Fatty Acids (OMEGA 3 PO) Take 1 tablet by mouth as needed.     omeprazole  (PRILOSEC) 40 MG capsule Take 1 capsule (40 mg total) by mouth daily. 90 capsule 3   Polyethyl Glycol-Propyl Glycol (SYSTANE FREE OP) Apply 1 drop to eye daily. Uses thera work for dr eyes per pt     Potassium 95 MG TABS Take 95 mg by mouth daily.     ramipril  (ALTACE ) 2.5 MG capsule Take 1 capsule (2.5 mg total) by mouth daily. 90 capsule 2   simvastatin  (ZOCOR ) 20 MG tablet Take 1 tablet (20 mg total) by mouth daily. 90 tablet 3   Stinging Nettle 2 % POWD Take 1 tablet by mouth daily at 12 noon. Reported on 12/16/2015     sucralfate  (CARAFATE ) 1 GM/10ML suspension Take 10 mLs (1 g total) by mouth 4 (four) times daily -  with meals and at bedtime. 420 mL 2   traZODone  (DESYREL ) 50 MG tablet Take 0.5-1 tablets (25-50 mg total) by mouth at bedtime. 90 tablet 2   TURMERIC PO Take 800 mg by mouth daily as needed (for well being). Reported on 12/16/2015     chlorhexidine (PERIDEX) 0.12 % solution 3 (three) times daily.     Continuous Glucose Sensor (FREESTYLE LIBRE 3 SENSOR) MISC Place 1 sensor on the skin every 14 days. Use to check glucose continuously 2 each 0   glipiZIDE  (GLUCOTROL  XL)  10 MG 24 hr tablet TAKE 1 TABLET(10 MG) BY MOUTH DAILY WITH BREAKFAST 90 tablet 0   glucose blood (ONETOUCH VERIO) test strip Use as directed to check blood gluclose daily. 100 each 12   No current facility-administered medications for this visit.   BP 123/66 (BP Location: Left Arm, Patient Position: Sitting, Cuff Size: Normal)   Pulse 76   Ht 5\' 4"  (1.626 m)   Wt 133 lb (60.3 kg)   SpO2 95%   BMI  22.83 kg/m   PHYSICAL EXAM:  BP 123/66 (BP Location: Left Arm, Patient Position: Sitting, Cuff Size: Normal)   Pulse 76   Ht 5\' 4"  (1.626 m)   Wt 133 lb (60.3 kg)   SpO2 95%   BMI 22.83 kg/m    Salient findings:  CN II-XII intact Given history and complaints, ear microscopy was indicated and performed for evaluation with findings as below in physical exam section and in procedures; Bilateral cerumen impactions - after clearance, bilateral eac clear, TM bilaterally intact and ME well aerated Nose: Anterior rhinoscopy reveals approx 1 cm x 1cm septal perforation caudal septum, bilateral inferior turbinate hypertrophy; perforation appears relatively clean, minor crusting posterior but otherwise no granulation. No significant mucosal edema.  Nasal endoscopy was indicated to better evaluate the nose and paranasal sinuses, given the patient's history and exam findings, and is detailed below. No lesions of oral cavity/oropharynx No obviously palpable neck masses/lymphadenopathy/thyromegaly No respiratory distress or stridor   PROCEDURE:  Prior to initiating any procedures, risks/benefits/alternatives were explained to the patient and verbal consent obtained. Diagnostic Nasal Endoscopy Pre-procedure diagnosis: Concern for chronic sinusitis, nasal septal perforation, evaluate for any other nasal lesions Post-procedure diagnosis: same Indication: See pre-procedure diagnosis and physical exam above Complications: None apparent EBL: minimal mL Anesthesia: Lidocaine  4% and topical decongestant was topically sprayed in each nasal cavity  Description of Procedure:  Patient was identified. A rigid 30 degree endoscope was utilized to evaluate the sinonasal cavities, mucosa, sinus ostia and turbinates and septum.  Overall, signs of mucosal inflammation are mild and global mucosal edema (mild) noted.  Also noted are septal perforation as above.  No mucopurulence, polyps, or masses noted.  Noted post  surgical changes on left with evidence of maxillary antrostomy and ethmoidectomy. Right Middle meatus: clear Right SE Recess: clear Left MM: clear Left SE Recess: clear   Photodocumentation was obtained.  CPT CODE -- 53664 - Mod 25  Procedure: Bilateral ear microscopy and cerumen removal using microscope (CPT [phone]) - Mod 25 Pre-procedure diagnosis: Cerumen impaction bilateral external ears Post-procedure diagnosis: same Indication: bilateral cerumen impaction; given patient's otologic complaints and history as well as for improved and comprehensive examination of external ear and tympanic membrane, bilateral otologic examination using microscope was performed and impacted cerumen removed  Procedure: Patient was placed semi-recumbent. Both ear canals were examined using the microscope with findings above. Impacted Cerumen removed on left and on right using suction and currette with improvement in EAC examination and patency. Patient tolerated the procedure well.       ASSESSMENT:  78 y.o. F with DM and prior nasal surgery (likely FESS and septoplasty based on endo) now with:  1. Nasal septal perforation   2. Nasal congestion   3. Hypertrophy of both inferior nasal turbinates   4. S/P FESS (functional endoscopic sinus surgery)   5. Sensation of fullness in both ears   6. Bilateral impacted cerumen    We've discussed issues and options today.  She has a clean  septal perforation, most likely after prior septo (denies any drug use or AI sx) and we did discuss workup for this including biopsy, autoimmune workup and also discussed etiologies for this as above. We reviewed the nasal endoscopy images together.  The risks, benefits and alternatives were discussed and questions answered.  She was offered prior repair and again options discussed including septal button v/s layered repair. We discussed R/B/A for this as well. She would like to observe this for now and avoid any procedures as  her main complaint is dryness and rare epistaxis.   In addition, b/l cerumen impactions cleared today. Offered audio, declined  1) Continue PO anthistamine, astelin  and flonase  2) Continue irrigations 3) Humidification with ayr gel multiple times per day 4) ceruminosis mgmt discussed 2) Follow-up in 4-5 months -- sooner as necessary; can reconsider repair or further workup at that time - she opted against it today  See below regarding exact medications prescribed this encounter including dosages and route: No orders of the defined types were placed in this encounter.    Thank you for allowing me the opportunity to care for your patient. Please do not hesitate to contact me should you have any other questions.  Sincerely, Milon Aloe, MD Otolaryngologist (ENT), [facility] Phone: [phone] Fax: [phone]  MDM:  Level 4: [phone] Complexity/Problems addressed: mod - multiple chronic problems Data complexity: mod - independent interpretation of notes, labs, testing - Morbidity: low currently  - Prescription Drug prescribed or managed: no  04/16/2024, 10:08 PM

## 2024-04-02 NOTE — Patient Instructions (Signed)
 AYR Gel - use a pea sized amount in each nostril twice per day using your pinkie finger

## 2024-04-06 ENCOUNTER — Other Ambulatory Visit: Payer: Self-pay | Admitting: Adult Health

## 2024-04-06 DIAGNOSIS — E119 Type 2 diabetes mellitus without complications: Secondary | ICD-10-CM

## 2024-04-07 ENCOUNTER — Other Ambulatory Visit: Payer: Self-pay | Admitting: Adult Health

## 2024-04-07 DIAGNOSIS — E119 Type 2 diabetes mellitus without complications: Secondary | ICD-10-CM

## 2024-04-08 ENCOUNTER — Other Ambulatory Visit (HOSPITAL_COMMUNITY): Payer: Self-pay

## 2024-04-08 ENCOUNTER — Other Ambulatory Visit: Payer: Self-pay | Admitting: Adult Health

## 2024-04-09 ENCOUNTER — Encounter: Payer: Self-pay | Admitting: Adult Health

## 2024-04-09 ENCOUNTER — Ambulatory Visit (INDEPENDENT_AMBULATORY_CARE_PROVIDER_SITE_OTHER)

## 2024-04-09 ENCOUNTER — Ambulatory Visit (INDEPENDENT_AMBULATORY_CARE_PROVIDER_SITE_OTHER): Payer: Medicare Other | Admitting: Adult Health

## 2024-04-09 VITALS — BP 130/60 | HR 76 | Temp 98.0°F | Ht 64.0 in | Wt 136.0 lb

## 2024-04-09 DIAGNOSIS — Z7984 Long term (current) use of oral hypoglycemic drugs: Secondary | ICD-10-CM | POA: Diagnosis not present

## 2024-04-09 DIAGNOSIS — E119 Type 2 diabetes mellitus without complications: Secondary | ICD-10-CM | POA: Diagnosis not present

## 2024-04-09 DIAGNOSIS — J309 Allergic rhinitis, unspecified: Secondary | ICD-10-CM

## 2024-04-09 LAB — POCT GLYCOSYLATED HEMOGLOBIN (HGB A1C): Hemoglobin A1C: 6.8 % — AB (ref 4.0–5.6)

## 2024-04-09 NOTE — Patient Instructions (Signed)
 Health Maintenance Due  Topic Date Due   DTaP/Tdap/Td (1 - Tdap) Never done   Zoster Vaccines- Shingrix (1 of 2) Never done   Pneumonia Vaccine 36+ Years old (2 of 2 - PPSV23) 03/24/2014   OPHTHALMOLOGY EXAM  09/04/2021   COVID-19 Vaccine (7 - 2024-25 season) 08/20/2023       09/20/2023    3:28 PM 09/16/2022    3:31 PM 04/08/2022    1:28 PM  Depression screen PHQ 2/9  Decreased Interest 0 0 0  Down, Depressed, Hopeless 0 0 0  PHQ - 2 Score 0 0 0

## 2024-04-09 NOTE — Progress Notes (Signed)
 \  Subjective:    Patient ID: Kimberly Lam, female    DOB: [birth year], 78 y.o.   MRN: [id number]  HPI 78 year old female who  has a past medical history of Acid reflux, Allergic rhinitis, Allergy, Anemia, Arthritis, Asthma, Bilateral sciatica, Cataract, Chronic bilateral low back pain, Colon polyps, Diverticulosis, DM type 2 (diabetes mellitus, type 2) (HCC), Environmental allergies, GERD (gastroesophageal reflux disease), Hyperlipidemia, Low back pain, Migraine, Osteoporosis, and PONV (postoperative nausea and vomiting).  She is being evaluated today for follow up regarding   Diabetes mellitus type 2-is managed with metformin  1000 mg extended release twice daily and glipzide 10 mg XL daily. She has not had any strips to check her blood sugar.  Does take ramipril  2.5 mg daily for kidney protection. She is eating healthier and trying to stay active.  Lab Results  Component Value Date   HGBA1C 7.2 (A) 01/09/2024   HGBA1C 7.6 (H) 09/08/2023   HGBA1C 6.9 05/18/2022   Wt Readings from Last 3 Encounters:  04/09/24 136 lb (61.7 kg)  04/02/24 133 lb (60.3 kg)  03/14/24 136 lb (61.7 kg)   Review of Systems See HPI   Past Medical History:  Diagnosis Date   Acid reflux    Allergic rhinitis    Allergy    Anemia    Arthritis    per MD- pt states no s/s of this    Asthma    Bilateral sciatica    Cataract    removed both eyes    Chronic bilateral low back pain    Colon polyps    Diverticulosis    DM type 2 (diabetes mellitus, type 2) (HCC)    Environmental allergies    flowers, pollen, trees   GERD (gastroesophageal reflux disease)    Hyperlipidemia    Low back pain    Migraine    past hx- control as allergies are controlled    Osteoporosis    PONV (postoperative nausea and vomiting)     Social History   Socioeconomic History   Marital status: Married    Spouse name: Not on file   Number of children: 2   Years of education: 16   Highest education level: Bachelor's  degree (e.g., BA, AB, BS)  Occupational History   Occupation: Retired  Tobacco Use   Smoking status: Never   Smokeless tobacco: Never  Vaping Use   Vaping status: Never Used  Substance and Sexual Activity   Alcohol  use: No    Alcohol /week: 0.0 standard drinks of alcohol    Drug use: No   Sexual activity: Not on file  Other Topics Concern   Not on file  Social History Narrative      Married 1 son one daughter, relocated to [place] after living in [place]  for many years. 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   No caffeine.          Lives at home with her husband and brother.   Right-handed.   No caffeine use.   Social Drivers of Corporate investment banker Strain: Low Risk  (09/20/2023)   Overall Financial Resource Strain (CARDIA)    Difficulty of Paying Living Expenses: Not hard at all  Food Insecurity: No Food Insecurity (09/20/2023)   Hunger Vital Sign    Worried About Running Out of Food in the Last Year: Never true    Ran Out of Food in the Last Year: Never true  Transportation Needs: No Transportation Needs (  09/20/2023)   PRAPARE - Administrator, Civil Service (Medical): No    Lack of Transportation (Non-Medical): No  Physical Activity: Insufficiently Active (09/20/2023)   Exercise Vital Sign    Days of Exercise per Week: 4 days    Minutes of Exercise per Session: 30 min  Stress: No Stress Concern Present (09/20/2023)   [employer] of Occupational Health - Occupational Stress Questionnaire    Feeling of Stress : Not at all  Recent Concern: Stress - Stress Concern Present (08/31/2023)   [employer] of Occupational Health - Occupational Stress Questionnaire    Feeling of Stress : To some extent  Social Connections: Socially Integrated (09/20/2023)   Social Connection and Isolation Panel [NHANES]    Frequency of Communication with Friends and Family: More than three times a week    Frequency of Social Gatherings with Friends and  Family: More than three times a week    Attends Religious Services: More than 4 times per year    Active Member of [employer] or Organizations: Yes    Attends Engineer, structural: More than 4 times per year    Marital Status: Married  Catering manager Violence: Not At Risk (09/20/2023)   Humiliation, Afraid, Rape, and Kick questionnaire    Fear of Current or Ex-Partner: No    Emotionally Abused: No    Physically Abused: No    Sexually Abused: No    Past Surgical History:  Procedure Laterality Date   CATARACT EXTRACTION, BILATERAL Bilateral 2012   CESAREAN SECTION     x 2   COLONOSCOPY     > 10 yrs ago- unsure if removed polyps as was told sev.different things after this colon    DENTAL SURGERY     molars removed    DILATION AND CURETTAGE OF UTERUS     NASAL SINUS SURGERY     TUBAL LIGATION      Family History  Problem Relation Age of Onset   Asthma Father    Emphysema Father    Alcohol  abuse Father    Diabetes Mellitus II Brother        x 2   Hyperlipidemia Brother    Hypertension Brother    Pancreatic cancer Brother    Prostate cancer Brother    Diabetes Brother    Heart disease Maternal Grandmother        hardening of the arteries   Heart murmur Daughter    Glaucoma Brother    Breast cancer Neg Hx    Colon cancer Neg Hx    Colon polyps Neg Hx    Esophageal cancer Neg Hx    Rectal cancer Neg Hx    Stomach cancer Neg Hx     Allergies  Allergen Reactions   Peanut -Containing Drug Products Anaphylaxis   Apple     Pt stated it " causes sinuses to swell and immflamation"    Egg-Derived Products Nausea Only    Nausea only- can eat cakes with eggs    Milk-Related Compounds Nausea And Vomiting    Milk and cheese *Can drink 1 cup a day*   Actos [Pioglitazone] Nausea Only   Soy Allergy (Obsolete) Nausea Only    Pt states had allergy testing and was told allergic to soy-causes nausea but no other reactions   Vicodin [Hydrocodone-Acetaminophen] Nausea Only     Current Outpatient Medications on File Prior to Visit  Medication Sig Dispense Refill   chlorhexidine (PERIDEX) 0.12 % solution 3 (three) times daily.  Accu-Chek FastClix Lancets MISC Use to check blood glucose TID 102 each 1   AIRSUPRA  90-80 MCG/ACT AERO Inhale 2 puffs into the lungs every 6 (six) hours as needed. 32.1 g 1   albuterol  (VENTOLIN  HFA) 108 (90 Base) MCG/ACT inhaler Inhale 2 puffs into the lungs every 4 (four) hours as needed for wheezing or shortness of breath (coughing fits). 18 g 1   Alcohol  Swabs (DROPSAFE ALCOHOL  PREP) 70 % PADS USE TO TEST BLOOD GLUCOSE FOUR TIMES DAILY AS DIRECTED 400 each 10   alendronate  (FOSAMAX ) 70 MG tablet Take 1 tablet (70 mg total) by mouth once a week WITH 8 OZ OF PLAIN WATER 30 MINUTES BEFORE FIRST FOOD, DRINK OR MEDS. STAY UPRIGHT FOR 30 MINUTES. 12 tablet 3   alendronate  (FOSAMAX ) 70 MG tablet Take 1 tablet (70 mg total) by mouth once a week with 8 oz of plain water, 30 minutes before first food, drink or medication. Stay upright for 30 minutes. 12 tablet 3   Ascorbic Acid (VITAMIN C) 1000 MG tablet Take 1,000 mg by mouth daily.     azelastine  (ASTELIN ) 0.1 % nasal spray Place 2 sprays into both nostrils 2 (two) times daily. Use in each nostril as directed 90 mL 1   Azelastine  HCl 137 MCG/SPRAY SOLN Place 2 sprays into both nostrils 2 (two) times daily. 120 mL 0   Barberry-Oreg Grape-Goldenseal (BERBERINE COMPLEX PO) Take 1 capsule by mouth daily.     Blood Glucose Monitoring Suppl (ACCU-CHEK GUIDE) w/Device KIT Use to check glucose TID 1 kit 0   CINNAMON PO Take 1 tablet by mouth as needed (for blood glucose).     Cranberry 450 MG CAPS Take 450 mg by mouth daily.     Cyanocobalamin (VITAMIN B-12 CR) 1500 MCG TBCR Take 1,500 mcg by mouth daily.     Echinacea-Goldenseal (ECHINACEA COMB/GOLDEN SEAL PO) Take 1 tablet by mouth as needed (for prevention). Reported on 12/16/2015     EPINEPHrine  0.3 mg/0.3 mL IJ SOAJ injection Inject 0.3 mg into  the muscle as needed for anaphylaxis. 6 each 1   fluticasone  (FLONASE ) 50 MCG/ACT nasal spray Place 2 sprays into both nostrils daily. 48 g 0   gabapentin  (NEURONTIN ) 300 MG capsule Take 2 capsules (600 mg total) by mouth 3 (three) times daily. 540 capsule 3   glipiZIDE  (GLUCOTROL  XL) 10 MG 24 hr tablet Take 1 tablet (10 mg total) by mouth daily with breakfast. 90 tablet 0   glucose blood (ACCU-CHEK GUIDE TEST) test strip Use as instructed 100 each 12   glucose blood (ONETOUCH VERIO) test strip Use to check blood sugar three times a day. 200 each 0   Green Tea, Camillia sinensis, (GREEN TEA PO) Take 1 tablet by mouth as needed (for wellbeing). Reported on 12/16/2015     Lactobacillus (ACIDOPHILUS PROBIOTIC PO) Take 1 tablet by mouth daily.     Lancets (ONETOUCH DELICA PLUS LANCET33G) MISC Use as directed to check blood sugar. 300 each 12   levocetirizine (XYZAL ) 5 MG tablet Take 1 tablet (5 mg total) by mouth every evening. 30 tablet 11   meclizine  (ANTIVERT ) 25 MG tablet Take 1 tablet (25 mg total) by mouth 3 (three) times daily as needed for dizziness. 30 tablet 0   Melatonin ER 5 MG TBCR Take 5 mg by mouth at bedtime as needed (FOR SLEEP).     metFORMIN  (GLUCOPHAGE -XR) 500 MG 24 hr tablet Take 2 tablets (1,000 mg total) by mouth 2 (two) times daily. 360  tablet 0   metFORMIN  (GLUCOPHAGE -XR) 500 MG 24 hr tablet Take 2 tablets (1,000 mg total) by mouth 2 (two) times daily. 360 tablet 0   montelukast  (SINGULAIR ) 10 MG tablet Take 1 tablet (10 mg total) by mouth at bedtime. 90 tablet 3   Omega-3 Fatty Acids (OMEGA 3 PO) Take 1 tablet by mouth as needed.     omeprazole  (PRILOSEC) 40 MG capsule Take 1 capsule (40 mg total) by mouth daily. 90 capsule 3   Polyethyl Glycol-Propyl Glycol (SYSTANE FREE OP) Apply 1 drop to eye daily. Uses thera work for dr eyes per pt     Potassium 95 MG TABS Take 95 mg by mouth daily.     ramipril  (ALTACE ) 2.5 MG capsule Take 1 capsule (2.5 mg total) by mouth daily. 90  capsule 2   simvastatin  (ZOCOR ) 20 MG tablet Take 1 tablet (20 mg total) by mouth daily. 90 tablet 3   Stinging Nettle 2 % POWD Take 1 tablet by mouth daily at 12 noon. Reported on 12/16/2015     sucralfate  (CARAFATE ) 1 GM/10ML suspension Take 10 mLs (1 g total) by mouth 4 (four) times daily -  with meals and at bedtime. 420 mL 2   traZODone  (DESYREL ) 50 MG tablet TAKE 1/2 TO 1 TABLET AT BEDTIME AS NEEDED FOR SLEEP 90 tablet 3   traZODone  (DESYREL ) 50 MG tablet Take 0.5-1 tablets (25-50 mg total) by mouth at bedtime. 90 tablet 2   TURMERIC PO Take 800 mg by mouth daily as needed (for well being). Reported on 12/16/2015     No current facility-administered medications on file prior to visit.    BP 130/60   Pulse 76   Temp 98 F (36.7 C) (Oral)   Ht 5\' 4"  (1.626 m)   Wt 136 lb (61.7 kg)   SpO2 93%   BMI 23.34 kg/m       Objective:   Physical Exam Vitals and nursing note reviewed.  Constitutional:      Appearance: Normal appearance.  Cardiovascular:     Rate and Rhythm: Normal rate and regular rhythm.     Pulses: Normal pulses.     Heart sounds: Normal heart sounds.  Pulmonary:     Effort: Pulmonary effort is normal.     Breath sounds: Normal breath sounds.  Musculoskeletal:        General: Normal range of motion.  Skin:    General: Skin is warm and dry.  Neurological:     General: No focal deficit present.     Mental Status: She is alert and oriented to person, place, and time.  Psychiatric:        Mood and Affect: Mood normal.        Behavior: Behavior normal.        Thought Content: Thought content normal.        Judgment: Judgment normal.        Assessment & Plan:  1. Diabetes mellitus without complication (HCC) (Primary)  - POC HgB A1c- 6.8  - No change in medication. - Follow up in 3 months  - Sample of Libre given   Garfield Coiner, NP

## 2024-04-10 NOTE — Telephone Encounter (Signed)
 The original prescription was discontinued on 02/08/2024 by Cosimo Diones, CMA for the following reason: Reorder. Renewing this prescription may not be appropriate.

## 2024-04-11 ENCOUNTER — Telehealth: Payer: Self-pay | Admitting: *Deleted

## 2024-04-11 ENCOUNTER — Other Ambulatory Visit (HOSPITAL_COMMUNITY): Payer: Self-pay

## 2024-04-11 MED ORDER — ONETOUCH VERIO VI STRP
ORAL_STRIP | 12 refills | Status: AC
  Filled 2024-04-11: qty 100, 90d supply, fill #0
  Filled 2024-04-15: qty 100, 100d supply, fill #0
  Filled 2024-05-06 – 2024-07-29 (×3): qty 100, 90d supply, fill #0
  Filled 2024-10-26: qty 100, 90d supply, fill #1
  Filled 2025-01-24: qty 100, 90d supply, fill #2

## 2024-04-11 MED ORDER — FREESTYLE LIBRE 3 SENSOR MISC
0 refills | Status: DC
  Filled 2024-04-11: qty 2, 28d supply, fill #0

## 2024-04-11 NOTE — Telephone Encounter (Signed)
 Copied from CRM [id number]. Topic: Clinical - Medication Question >> Apr 11, 2024  2:24 PM Kimberly Lam wrote: Reason for CRM: Patient states she was given a sample of the [place] 3 plus sensor and would like to know if provider can send in a prescription for it to the [facility] on file, thanks.  Kimberly Lam [phone]

## 2024-04-11 NOTE — Telephone Encounter (Signed)
 Pharmacy is stating that a PA is needed for the lancets and the meter. Can someone help with this.

## 2024-04-11 NOTE — Telephone Encounter (Signed)
 Rx sent to pharmacy

## 2024-04-12 ENCOUNTER — Telehealth: Payer: Self-pay

## 2024-04-12 ENCOUNTER — Other Ambulatory Visit (HOSPITAL_COMMUNITY): Payer: Self-pay

## 2024-04-12 NOTE — Telephone Encounter (Signed)
 Pharmacy Patient Advocate Encounter   Received notification from Pt Calls Messages that prior authorization for Accu-Chek Guide w/Device kit is required/requested.   Insurance verification completed.   The patient is insured through [facility] ADVANTAGE/RX ADVANCE .   Per test claim: PA required; PA submitted to above mentioned insurance via CoverMyMeds Key/confirmation #/EOC  [id number] Status is pending

## 2024-04-12 NOTE — Telephone Encounter (Signed)
 Pharmacy Patient Advocate Encounter  Received notification from [employer] that Prior Authorization for Accu-Chek Guide w/Device kit has been DENIED.  Full denial letter will be uploaded to the media tab. See denial reason below.  We denied this request under Medicare Part D because: Drug eligible for coverage under Medicare Part A or Part B.   This request wad denied under your Medicare Part D benefit; however, coverage/payment for the requested drug(s) has been approved under Medicare Part B as long as your doctor continues to prescribe the drug for you, and it continues to be safe and effective for treating your condition.   PA #/Case ID/Reference #: [id number]

## 2024-04-12 NOTE — Telephone Encounter (Signed)
 Pharmacy Patient Advocate Encounter  Received notification from [employer] that Prior Authorization for Accu-Chek FastClix Lancets has been DENIED.  Full denial letter will be uploaded to the media tab. See denial reason below.  We denied this request under Medicare Part D because: Drug eligible for coverage under Medicare art A or Part B  This request was denied under your Medicare Part D benefit; however, coverage/payment for the requested drug(s) has been approved under Medicare Part B as long as your doctor continues to prescribe the drug for you, and it continues to be safe and effective for treating your condition.   PA #/Case ID/Reference #: [id number]

## 2024-04-12 NOTE — Telephone Encounter (Signed)
 PA request has been Submitted. New Encounter has been or will be created for follow up. For additional info see Pharmacy Prior Auth telephone encounter from 04/12/24.

## 2024-04-12 NOTE — Telephone Encounter (Signed)
 Pharmacy Patient Advocate Encounter   Received notification from Pt Calls Messages that prior authorization for Accu-Chek FastClix Lancets is required/requested.   Insurance verification completed.   The patient is insured through [facility] ADVANTAGE/RX ADVANCE .   Per test claim: PA required; PA submitted to above mentioned insurance via CoverMyMeds Key/confirmation #/EOC [id number] Status is pending

## 2024-04-15 ENCOUNTER — Encounter: Payer: Self-pay | Admitting: Adult Health

## 2024-04-15 ENCOUNTER — Other Ambulatory Visit (HOSPITAL_COMMUNITY): Payer: Self-pay

## 2024-04-16 ENCOUNTER — Ambulatory Visit (INDEPENDENT_AMBULATORY_CARE_PROVIDER_SITE_OTHER): Payer: Medicare Other | Admitting: Podiatry

## 2024-04-16 ENCOUNTER — Encounter (INDEPENDENT_AMBULATORY_CARE_PROVIDER_SITE_OTHER): Payer: Self-pay

## 2024-04-16 ENCOUNTER — Encounter: Payer: Self-pay | Admitting: Podiatry

## 2024-04-16 DIAGNOSIS — M79675 Pain in left toe(s): Secondary | ICD-10-CM | POA: Diagnosis not present

## 2024-04-16 DIAGNOSIS — B351 Tinea unguium: Secondary | ICD-10-CM

## 2024-04-16 DIAGNOSIS — G6289 Other specified polyneuropathies: Secondary | ICD-10-CM

## 2024-04-16 DIAGNOSIS — E119 Type 2 diabetes mellitus without complications: Secondary | ICD-10-CM | POA: Diagnosis not present

## 2024-04-16 DIAGNOSIS — M79674 Pain in right toe(s): Secondary | ICD-10-CM | POA: Diagnosis not present

## 2024-04-16 NOTE — Telephone Encounter (Signed)
**Note De-identified  [place] Obfuscation** Please advise 

## 2024-04-16 NOTE — Progress Notes (Signed)
 Subjective:  Patient ID: Kimberly Lam, female    DOB: 03/08/1946,  MRN: [id number]  78 y.o. female presents at risk foot care. Patient has history of diabetes and polyneuropathy and painful elongated mycotic toenails 1-5 bilaterally which are tender when wearing enclosed shoe gear. Pain is relieved with periodic professional debridement.  Chief Complaint  Patient presents with   routine foot care    Rm 16 RFC/diabetic/blood sugar 148\last A1C 6.8/NIDDM/ Last pcp visit 04/09/24 Dr.Cory Nafziger   New problem(s): None   PCP is Alto Atta, NP.  Allergies  Allergen Reactions   Peanut -Containing Drug Products Anaphylaxis   Apple     Pt stated it " causes sinuses to swell and immflamation"    Egg-Derived Products Nausea Only    Nausea only- can eat cakes with eggs    Milk-Related Compounds Nausea And Vomiting    Milk and cheese *Can drink 1 cup a day*   Actos [Pioglitazone] Nausea Only   Soy Allergy (Obsolete) Nausea Only    Pt states had allergy testing and was told allergic to soy-causes nausea but no other reactions   Vicodin [Hydrocodone-Acetaminophen] Nausea Only    Review of Systems: Negative except as noted in the HPI.   Objective:  Kimberly Lam is a pleasant 78 y.o. female WD, WN in NAD. AAO x 3.  Vascular Examination: Vascular status intact b/l with palpable pedal pulses. CFT immediate b/l. Pedal hair present. No edema. No pain with calf compression b/l. Skin temperature gradient WNL b/l. No varicosities noted. No cyanosis or clubbing noted.  Neurological Examination: Sensation grossly intact b/l with 10 gram monofilament. Vibratory sensation intact b/l.  Dermatological Examination: Pedal skin with normal turgor, texture and tone b/l. No open wounds nor interdigital macerations noted. Toenails 1-5 b/l thick, discolored, elongated with subungual debris and pain on dorsal palpation. No hyperkeratotic lesions noted b/l.   Musculoskeletal  Examination: Muscle strength 5/5 to b/l LE.  No pain, crepitus noted b/l. No gross pedal deformities. Patient ambulates independently without assistive aids.   Radiographs: None  Last A1c:      Latest Ref Rng & Units 04/09/2024    1:16 PM 01/09/2024    2:07 PM 09/08/2023    1:05 PM  Hemoglobin A1C  Hemoglobin-A1c 4.0 - 5.6 % 6.8  7.2  7.6    Assessment:   1. Pain due to onychomycosis of toenails of both feet   2. Other polyneuropathy   3. Diabetes mellitus without complication (HCC)    Plan:  Consent given for treatment. Patient examined. All patient's and/or POA's questions/concerns addressed on today's visit. Mycotic toenails 1-5 debrided in length and girth without incident. Continue foot and shoe inspections daily. Monitor blood glucose per PCP/Endocrinologist's recommendations.Continue soft, supportive shoe gear daily. Report any pedal injuries to medical professional. Call office if there are any quesitons/concerns. -Patient/POA to call should there be question/concern in the interim.  Return in about 3 months (around 07/16/2024).  Luella Sager, DPM      [place] LOCATION: [address] [place] [address]                   Office [phone]   [place] LOCATION: [address]  [place], [place] [address] Office [phone]

## 2024-04-16 NOTE — Telephone Encounter (Signed)
 Spoke to pt and she is trying the [employer] with no issues. Pt wants to see how this helps and will call if she changes her mind on the meter.

## 2024-04-24 ENCOUNTER — Ambulatory Visit (INDEPENDENT_AMBULATORY_CARE_PROVIDER_SITE_OTHER): Payer: Self-pay

## 2024-04-24 DIAGNOSIS — J309 Allergic rhinitis, unspecified: Secondary | ICD-10-CM | POA: Diagnosis not present

## 2024-04-29 ENCOUNTER — Other Ambulatory Visit (HOSPITAL_COMMUNITY): Payer: Self-pay

## 2024-04-30 ENCOUNTER — Other Ambulatory Visit: Payer: Self-pay | Admitting: Adult Health

## 2024-04-30 ENCOUNTER — Other Ambulatory Visit (HOSPITAL_COMMUNITY): Payer: Self-pay

## 2024-04-30 MED ORDER — FREESTYLE LIBRE 3 PLUS SENSOR MISC
6 refills | Status: DC
  Filled 2024-04-30 – 2024-05-02 (×2): qty 2, 30d supply, fill #0
  Filled 2024-05-03: qty 2, 28d supply, fill #0
  Filled 2024-05-24: qty 2, 28d supply, fill #1
  Filled 2024-05-27: qty 2, 28d supply, fill #0
  Filled 2024-06-27: qty 2, 28d supply, fill #1
  Filled 2024-07-25: qty 2, 28d supply, fill #2
  Filled 2024-08-22: qty 2, 28d supply, fill #3
  Filled 2024-09-16: qty 2, 28d supply, fill #4
  Filled 2024-10-07: qty 2, 28d supply, fill #5

## 2024-05-02 ENCOUNTER — Other Ambulatory Visit: Payer: Self-pay

## 2024-05-02 ENCOUNTER — Other Ambulatory Visit (HOSPITAL_COMMUNITY): Payer: Self-pay

## 2024-05-03 ENCOUNTER — Other Ambulatory Visit (HOSPITAL_COMMUNITY): Payer: Self-pay

## 2024-05-06 ENCOUNTER — Other Ambulatory Visit (HOSPITAL_COMMUNITY): Payer: Self-pay

## 2024-05-06 ENCOUNTER — Other Ambulatory Visit: Payer: Self-pay

## 2024-05-07 ENCOUNTER — Ambulatory Visit (INDEPENDENT_AMBULATORY_CARE_PROVIDER_SITE_OTHER)

## 2024-05-07 DIAGNOSIS — J309 Allergic rhinitis, unspecified: Secondary | ICD-10-CM | POA: Diagnosis not present

## 2024-05-09 ENCOUNTER — Other Ambulatory Visit (HOSPITAL_COMMUNITY): Payer: Self-pay

## 2024-05-09 ENCOUNTER — Encounter: Payer: Self-pay | Admitting: Adult Health

## 2024-05-09 ENCOUNTER — Other Ambulatory Visit: Payer: Self-pay

## 2024-05-09 DIAGNOSIS — E113213 Type 2 diabetes mellitus with mild nonproliferative diabetic retinopathy with macular edema, bilateral: Secondary | ICD-10-CM | POA: Diagnosis not present

## 2024-05-09 DIAGNOSIS — H04123 Dry eye syndrome of bilateral lacrimal glands: Secondary | ICD-10-CM | POA: Diagnosis not present

## 2024-05-09 DIAGNOSIS — H16143 Punctate keratitis, bilateral: Secondary | ICD-10-CM | POA: Diagnosis not present

## 2024-05-09 DIAGNOSIS — H35033 Hypertensive retinopathy, bilateral: Secondary | ICD-10-CM | POA: Diagnosis not present

## 2024-05-09 NOTE — Telephone Encounter (Signed)
**Note De-identified  [place] Obfuscation** Please advise 

## 2024-05-10 ENCOUNTER — Telehealth: Payer: Self-pay

## 2024-05-10 NOTE — Telephone Encounter (Signed)
 Please call patient.  I don't know anyone specifically in [place], [place].  This is the [place] [employer] of [employer] website where she can search for an allergist by inputting her zipcode.  [email or web address]  She can take her vials - but she needs to ask the allergist if they are willing to finish administering her current vials. Once she is finished, they will most likely mix their own.

## 2024-05-10 NOTE — Telephone Encounter (Signed)
 LVM requesting callback about new allergist. Please advise her of Dr. Burdette Carolin message if she calls back.

## 2024-05-10 NOTE — Telephone Encounter (Signed)
 Patient called stating she was moving to [place], [place] at the end of the month. Patient is wondering if Dr. Burdette Carolin could refer her to an allergist in [place], [place]. She is also wanting to take her vials once she establishes care.

## 2024-05-27 ENCOUNTER — Other Ambulatory Visit: Payer: Self-pay

## 2024-05-27 ENCOUNTER — Other Ambulatory Visit (HOSPITAL_COMMUNITY): Payer: Self-pay

## 2024-05-28 ENCOUNTER — Other Ambulatory Visit (HOSPITAL_COMMUNITY): Payer: Self-pay

## 2024-05-29 ENCOUNTER — Other Ambulatory Visit (HOSPITAL_COMMUNITY): Payer: Self-pay

## 2024-05-30 ENCOUNTER — Other Ambulatory Visit (HOSPITAL_COMMUNITY): Payer: Self-pay

## 2024-05-31 ENCOUNTER — Other Ambulatory Visit (HOSPITAL_COMMUNITY): Payer: Self-pay

## 2024-06-01 ENCOUNTER — Other Ambulatory Visit (HOSPITAL_COMMUNITY): Payer: Self-pay

## 2024-06-03 ENCOUNTER — Other Ambulatory Visit (HOSPITAL_COMMUNITY): Payer: Self-pay

## 2024-06-04 ENCOUNTER — Other Ambulatory Visit (HOSPITAL_COMMUNITY): Payer: Self-pay

## 2024-06-05 ENCOUNTER — Other Ambulatory Visit (HOSPITAL_COMMUNITY): Payer: Self-pay

## 2024-06-06 ENCOUNTER — Other Ambulatory Visit: Payer: Self-pay | Admitting: Adult Health

## 2024-06-06 ENCOUNTER — Other Ambulatory Visit (HOSPITAL_COMMUNITY): Payer: Self-pay

## 2024-06-06 ENCOUNTER — Other Ambulatory Visit: Payer: Self-pay

## 2024-06-06 DIAGNOSIS — E119 Type 2 diabetes mellitus without complications: Secondary | ICD-10-CM

## 2024-06-06 MED ORDER — GLIPIZIDE ER 10 MG PO TB24
10.0000 mg | ORAL_TABLET | Freq: Every day | ORAL | 0 refills | Status: AC
  Filled 2024-06-06 – 2024-08-08 (×2): qty 90, 90d supply, fill #0
  Filled 2024-08-09 – 2024-09-03 (×2): qty 30, 30d supply, fill #0
  Filled 2024-10-07 – 2024-12-06 (×6): qty 30, 30d supply, fill #1
  Filled 2025-01-05 – 2025-01-06 (×2): qty 30, 30d supply, fill #2

## 2024-06-07 ENCOUNTER — Other Ambulatory Visit (HOSPITAL_COMMUNITY): Payer: Self-pay

## 2024-06-10 ENCOUNTER — Other Ambulatory Visit (HOSPITAL_COMMUNITY): Payer: Self-pay

## 2024-06-29 ENCOUNTER — Other Ambulatory Visit (HOSPITAL_COMMUNITY): Payer: Self-pay

## 2024-07-01 ENCOUNTER — Other Ambulatory Visit (HOSPITAL_COMMUNITY): Payer: Self-pay

## 2024-07-05 ENCOUNTER — Other Ambulatory Visit (HOSPITAL_COMMUNITY): Payer: Self-pay

## 2024-07-05 ENCOUNTER — Other Ambulatory Visit: Payer: Self-pay

## 2024-07-09 ENCOUNTER — Ambulatory Visit: Admitting: Adult Health

## 2024-07-10 ENCOUNTER — Other Ambulatory Visit: Payer: Self-pay | Admitting: Adult Health

## 2024-07-10 DIAGNOSIS — E119 Type 2 diabetes mellitus without complications: Secondary | ICD-10-CM

## 2024-07-23 ENCOUNTER — Ambulatory Visit: Admitting: Podiatry

## 2024-07-29 ENCOUNTER — Other Ambulatory Visit (HOSPITAL_COMMUNITY): Payer: Self-pay

## 2024-07-29 ENCOUNTER — Other Ambulatory Visit: Payer: Self-pay

## 2024-07-29 ENCOUNTER — Other Ambulatory Visit: Payer: Self-pay | Admitting: Allergy

## 2024-07-30 ENCOUNTER — Other Ambulatory Visit (HOSPITAL_COMMUNITY): Payer: Self-pay

## 2024-07-31 ENCOUNTER — Other Ambulatory Visit (HOSPITAL_COMMUNITY): Payer: Self-pay

## 2024-08-01 ENCOUNTER — Encounter (HOSPITAL_COMMUNITY): Payer: Self-pay

## 2024-08-01 ENCOUNTER — Other Ambulatory Visit (HOSPITAL_COMMUNITY): Payer: Self-pay

## 2024-08-02 ENCOUNTER — Other Ambulatory Visit (HOSPITAL_COMMUNITY): Payer: Self-pay

## 2024-08-02 MED ORDER — TRAZODONE HCL 50 MG PO TABS
25.0000 mg | ORAL_TABLET | ORAL | 3 refills | Status: AC
  Filled 2024-08-02 – 2024-10-11 (×2): qty 90, 90d supply, fill #0
  Filled 2024-10-15 – 2024-12-23 (×2): qty 90, 90d supply, fill #1

## 2024-08-02 MED ORDER — SM CALCIUM/VITAMIN D3 600-800 MG-UNIT PO TABS
1.0000 | ORAL_TABLET | Freq: Every morning | ORAL | 4 refills | Status: AC
  Filled 2024-08-02: qty 100, 100d supply, fill #0
  Filled 2024-08-08 – 2024-08-09 (×2): qty 100, 100d supply, fill #1
  Filled 2024-09-03: qty 30, 30d supply, fill #0
  Filled 2024-10-07 – 2024-10-15 (×3): qty 30, 30d supply, fill #1
  Filled 2024-11-05 – 2024-11-07 (×2): qty 30, 30d supply, fill #2
  Filled 2024-12-06 – 2024-12-23 (×3): qty 30, 30d supply, fill #3

## 2024-08-02 MED ORDER — B-12 1000 MCG SL SUBL
1.0000 | SUBLINGUAL_TABLET | Freq: Every day | SUBLINGUAL | 4 refills | Status: AC
  Filled 2024-10-11 – 2024-10-15 (×2): qty 90, fill #0

## 2024-08-02 MED ORDER — OMEPRAZOLE 40 MG PO CPDR
40.0000 mg | DELAYED_RELEASE_CAPSULE | Freq: Every day | ORAL | 3 refills | Status: AC
  Filled 2024-08-02 – 2024-08-09 (×3): qty 90, 90d supply, fill #0
  Filled 2024-09-03 – 2024-10-07 (×2): qty 30, 30d supply, fill #0
  Filled 2024-10-15: qty 90, 90d supply, fill #0
  Filled 2024-11-05 – 2024-11-07 (×2): qty 30, 30d supply, fill #0
  Filled 2024-12-06: qty 30, 30d supply, fill #1

## 2024-08-02 MED ORDER — ROSUVASTATIN CALCIUM 10 MG PO TABS
10.0000 mg | ORAL_TABLET | Freq: Every morning | ORAL | 3 refills | Status: AC
  Filled 2024-08-02 – 2024-08-09 (×3): qty 90, 90d supply, fill #0
  Filled 2024-09-03 – 2024-10-15 (×3): qty 30, 30d supply, fill #0
  Filled 2024-11-05 – 2024-11-07 (×2): qty 30, 30d supply, fill #1
  Filled 2024-12-06: qty 30, 30d supply, fill #2
  Filled 2024-12-23: qty 30, 30d supply, fill #3

## 2024-08-02 MED ORDER — RAMIPRIL 2.5 MG PO CAPS
2.5000 mg | ORAL_CAPSULE | Freq: Every morning | ORAL | 4 refills | Status: AC
  Filled 2024-08-02 – 2024-08-09 (×3): qty 90, 90d supply, fill #0
  Filled 2024-09-03 – 2024-10-15 (×3): qty 30, 30d supply, fill #0
  Filled 2024-11-05 – 2024-11-07 (×2): qty 30, 30d supply, fill #1
  Filled 2024-12-06: qty 30, 30d supply, fill #2
  Filled 2024-12-23: qty 30, 30d supply, fill #3

## 2024-08-02 MED ORDER — LEVOCETIRIZINE DIHYDROCHLORIDE 5 MG PO TABS
5.0000 mg | ORAL_TABLET | Freq: Every morning | ORAL | 3 refills | Status: AC
  Filled 2024-08-02: qty 90, 90d supply, fill #0
  Filled 2024-09-04 – 2024-10-15 (×3): qty 30, 30d supply, fill #0
  Filled 2024-11-05 – 2024-11-07 (×2): qty 30, 30d supply, fill #1
  Filled 2024-12-06: qty 30, 30d supply, fill #2
  Filled 2024-12-23: qty 30, 30d supply, fill #3

## 2024-08-03 ENCOUNTER — Other Ambulatory Visit (HOSPITAL_COMMUNITY): Payer: Self-pay

## 2024-08-05 ENCOUNTER — Other Ambulatory Visit (HOSPITAL_COMMUNITY): Payer: Self-pay

## 2024-08-05 ENCOUNTER — Other Ambulatory Visit: Payer: Self-pay

## 2024-08-05 ENCOUNTER — Ambulatory Visit (INDEPENDENT_AMBULATORY_CARE_PROVIDER_SITE_OTHER): Admitting: Otolaryngology

## 2024-08-06 ENCOUNTER — Other Ambulatory Visit (HOSPITAL_COMMUNITY): Payer: Self-pay

## 2024-08-06 ENCOUNTER — Other Ambulatory Visit: Payer: Self-pay

## 2024-08-07 ENCOUNTER — Other Ambulatory Visit (HOSPITAL_COMMUNITY): Payer: Self-pay

## 2024-08-08 ENCOUNTER — Other Ambulatory Visit: Payer: Self-pay | Admitting: Adult Health

## 2024-08-08 ENCOUNTER — Encounter: Payer: Self-pay | Admitting: Pharmacist

## 2024-08-08 ENCOUNTER — Other Ambulatory Visit: Payer: Self-pay

## 2024-08-08 DIAGNOSIS — E119 Type 2 diabetes mellitus without complications: Secondary | ICD-10-CM

## 2024-08-08 MED ORDER — METFORMIN HCL ER 500 MG PO TB24
1000.0000 mg | ORAL_TABLET | Freq: Two times a day (BID) | ORAL | 0 refills | Status: AC
  Filled 2024-08-08 – 2024-09-03 (×2): qty 120, 30d supply, fill #0
  Filled 2024-10-07 – 2025-01-05 (×3): qty 120, 30d supply, fill #1

## 2024-08-09 ENCOUNTER — Other Ambulatory Visit (HOSPITAL_COMMUNITY): Payer: Self-pay

## 2024-08-09 ENCOUNTER — Other Ambulatory Visit: Payer: Self-pay

## 2024-08-12 ENCOUNTER — Other Ambulatory Visit: Payer: Self-pay

## 2024-08-13 ENCOUNTER — Other Ambulatory Visit: Payer: Self-pay

## 2024-08-14 ENCOUNTER — Other Ambulatory Visit (HOSPITAL_COMMUNITY): Payer: Self-pay

## 2024-08-15 ENCOUNTER — Other Ambulatory Visit: Payer: Self-pay

## 2024-08-16 ENCOUNTER — Other Ambulatory Visit (HOSPITAL_COMMUNITY): Payer: Self-pay

## 2024-08-20 ENCOUNTER — Other Ambulatory Visit: Payer: Self-pay | Admitting: Adult Health

## 2024-08-20 ENCOUNTER — Other Ambulatory Visit (HOSPITAL_BASED_OUTPATIENT_CLINIC_OR_DEPARTMENT_OTHER): Payer: Self-pay

## 2024-08-20 ENCOUNTER — Other Ambulatory Visit: Payer: Self-pay

## 2024-08-21 ENCOUNTER — Other Ambulatory Visit (HOSPITAL_COMMUNITY): Payer: Self-pay

## 2024-08-21 ENCOUNTER — Other Ambulatory Visit (HOSPITAL_BASED_OUTPATIENT_CLINIC_OR_DEPARTMENT_OTHER): Payer: Self-pay

## 2024-08-21 MED ORDER — FLUTICASONE PROPIONATE 50 MCG/ACT NA SUSP
2.0000 | Freq: Every day | NASAL | 0 refills | Status: DC
  Filled 2024-08-21 – 2024-08-22 (×2): qty 48, 90d supply, fill #0

## 2024-08-22 ENCOUNTER — Other Ambulatory Visit (HOSPITAL_BASED_OUTPATIENT_CLINIC_OR_DEPARTMENT_OTHER): Payer: Self-pay

## 2024-08-22 ENCOUNTER — Other Ambulatory Visit (HOSPITAL_COMMUNITY): Payer: Self-pay

## 2024-08-22 ENCOUNTER — Other Ambulatory Visit: Payer: Self-pay

## 2024-08-28 ENCOUNTER — Other Ambulatory Visit: Payer: Self-pay

## 2024-09-03 ENCOUNTER — Other Ambulatory Visit: Payer: Self-pay

## 2024-09-03 ENCOUNTER — Other Ambulatory Visit (HOSPITAL_COMMUNITY): Payer: Self-pay

## 2024-09-04 ENCOUNTER — Other Ambulatory Visit: Payer: Self-pay

## 2024-09-05 ENCOUNTER — Other Ambulatory Visit: Payer: Self-pay

## 2024-09-05 ENCOUNTER — Other Ambulatory Visit (HOSPITAL_COMMUNITY): Payer: Self-pay

## 2024-09-06 ENCOUNTER — Other Ambulatory Visit (HOSPITAL_COMMUNITY): Payer: Self-pay

## 2024-09-06 ENCOUNTER — Other Ambulatory Visit: Payer: Self-pay

## 2024-09-10 ENCOUNTER — Other Ambulatory Visit (HOSPITAL_COMMUNITY): Payer: Self-pay

## 2024-09-11 ENCOUNTER — Other Ambulatory Visit: Payer: Self-pay

## 2024-09-16 ENCOUNTER — Other Ambulatory Visit: Payer: Self-pay

## 2024-09-18 ENCOUNTER — Other Ambulatory Visit (HOSPITAL_COMMUNITY): Payer: Self-pay

## 2024-09-19 ENCOUNTER — Other Ambulatory Visit: Payer: Self-pay

## 2024-09-19 ENCOUNTER — Other Ambulatory Visit (HOSPITAL_COMMUNITY): Payer: Self-pay

## 2024-09-20 ENCOUNTER — Other Ambulatory Visit: Payer: Self-pay

## 2024-09-26 ENCOUNTER — Other Ambulatory Visit: Payer: Self-pay

## 2024-09-26 ENCOUNTER — Other Ambulatory Visit: Payer: Self-pay | Admitting: Allergy

## 2024-09-26 ENCOUNTER — Other Ambulatory Visit (HOSPITAL_COMMUNITY): Payer: Self-pay

## 2024-09-27 ENCOUNTER — Other Ambulatory Visit: Payer: Self-pay

## 2024-09-29 NOTE — Progress Notes (Signed)
   09/29/2024  Patient ID: Kimberly Lam, female   DOB: 07-14-1946, 78 y.o.   MRN: [id number]  Pharmacy Quality Measure Review  This patient is appearing on a report for being at risk of failing the adherence measure for diabetes medications this calendar year.   Medication: Metformin  and Glipizide  Last fill date: 09/16/24 for 30 day supply  Insurance report was not up to date. No action needed at this time.   Jon VEAR Lindau, PharmD Clinical Pharmacist [phone]

## 2024-10-07 ENCOUNTER — Other Ambulatory Visit: Payer: Self-pay

## 2024-10-09 ENCOUNTER — Other Ambulatory Visit: Payer: Self-pay

## 2024-10-11 ENCOUNTER — Other Ambulatory Visit: Payer: Self-pay | Admitting: Adult Health

## 2024-10-11 ENCOUNTER — Other Ambulatory Visit (HOSPITAL_COMMUNITY): Payer: Self-pay

## 2024-10-11 ENCOUNTER — Other Ambulatory Visit: Payer: Self-pay

## 2024-10-12 ENCOUNTER — Other Ambulatory Visit (HOSPITAL_COMMUNITY): Payer: Self-pay

## 2024-10-14 ENCOUNTER — Other Ambulatory Visit: Payer: Self-pay

## 2024-10-15 ENCOUNTER — Other Ambulatory Visit (HOSPITAL_COMMUNITY): Payer: Self-pay

## 2024-10-15 ENCOUNTER — Other Ambulatory Visit: Payer: Self-pay | Admitting: Adult Health

## 2024-10-15 ENCOUNTER — Other Ambulatory Visit: Payer: Self-pay

## 2024-10-15 ENCOUNTER — Other Ambulatory Visit: Payer: Self-pay | Admitting: Allergy

## 2024-10-15 MED FILL — Metformin HCl Tab ER 24HR 500 MG: ORAL | 30 days supply | Qty: 120 | Fill #0 | Status: AC

## 2024-10-15 MED FILL — Metformin HCl Tab ER 24HR 500 MG: ORAL | 90 days supply | Qty: 360 | Fill #0 | Status: CN

## 2024-10-16 ENCOUNTER — Other Ambulatory Visit: Payer: Self-pay

## 2024-10-16 ENCOUNTER — Telehealth: Payer: Self-pay

## 2024-10-16 MED ORDER — FLUTICASONE PROPIONATE 50 MCG/ACT NA SUSP
2.0000 | Freq: Every day | NASAL | 0 refills | Status: AC
  Filled 2024-10-16 – 2024-11-13 (×2): qty 48, 90d supply, fill #0

## 2024-10-16 MED ORDER — FREESTYLE LIBRE 3 PLUS SENSOR MISC
6 refills | Status: AC
  Filled 2024-10-16: qty 2, 28d supply, fill #0
  Filled 2024-11-07: qty 2, 30d supply, fill #0

## 2024-10-16 NOTE — Telephone Encounter (Signed)
*  AA  Pharmacy Patient Advocate Encounter   Received notification from CoverMyMeds that prior authorization for Airsupra  90-80MCG/ACT aerosol  is required/requested.   Insurance verification completed.   The patient is insured through [facility].   Information regarding your request The patient currently has access to the requested medication and a Prior Authorization is not needed for the patient/medication.

## 2024-10-17 ENCOUNTER — Encounter (HOSPITAL_COMMUNITY): Payer: Self-pay

## 2024-10-17 ENCOUNTER — Other Ambulatory Visit (HOSPITAL_COMMUNITY): Payer: Self-pay

## 2024-10-18 NOTE — Progress Notes (Unsigned)
   10/18/2024  Patient ID: Kimberly Lam, female   DOB: 15-Oct-1946, 78 y.o.   MRN: [id number]  Pharmacy Quality Measure Review  This patient is appearing on a report for being at risk of failing the adherence measure for cholesterol (statin), diabetes, and hypertension (ACEi/ARB) medications this calendar year.   Medication: Ramipril  Last fill date: 10/15/24 for 30 day supply  Medication: Glipizide  Last fill date: 09/30/24 for 90 day supply  Medication: Metformin  Last fill date: 10/15/24 for 30 day supply  Medication: Rosuvastatin  Last fill date: 10/15/24 for 30 day supply  Patient will fail adherence metric for Ramipril  and Metformin  due to fill gaps earlier in the year. Is enrolled in compliance packaging with [facility], assisting with adherence going forward.  Jon VEAR Lindau, PharmD Clinical Pharmacist [phone]

## 2024-11-05 ENCOUNTER — Other Ambulatory Visit: Payer: Self-pay

## 2024-11-05 MED FILL — Metformin HCl Tab ER 24HR 500 MG: ORAL | 30 days supply | Qty: 120 | Fill #1 | Status: CN

## 2024-11-07 ENCOUNTER — Other Ambulatory Visit (HOSPITAL_COMMUNITY): Payer: Self-pay

## 2024-11-07 ENCOUNTER — Other Ambulatory Visit: Payer: Self-pay

## 2024-11-07 MED ORDER — OMEPRAZOLE 40 MG PO CPDR
40.0000 mg | DELAYED_RELEASE_CAPSULE | Freq: Every day | ORAL | 3 refills | Status: AC
  Filled 2024-11-07: qty 30, 30d supply, fill #0

## 2024-11-07 MED ORDER — RAMIPRIL 2.5 MG PO CAPS
2.5000 mg | ORAL_CAPSULE | ORAL | 3 refills | Status: AC
  Filled 2024-11-07 – 2025-01-05 (×4): qty 90, 90d supply, fill #0

## 2024-11-07 MED ORDER — OMEPRAZOLE 40 MG PO CPDR
40.0000 mg | DELAYED_RELEASE_CAPSULE | Freq: Every day | ORAL | 3 refills | Status: AC
  Filled 2024-12-23: qty 90, 90d supply, fill #0

## 2024-11-07 MED ORDER — ROSUVASTATIN CALCIUM 10 MG PO TABS
10.0000 mg | ORAL_TABLET | ORAL | 3 refills | Status: AC
  Filled 2024-11-07 – 2025-01-05 (×4): qty 90, 90d supply, fill #0

## 2024-11-07 MED ORDER — VITAMIN B-12 1000 MCG PO TABS
1000.0000 ug | ORAL_TABLET | Freq: Every day | ORAL | 3 refills | Status: AC
  Filled 2024-11-07: qty 30, 30d supply, fill #0
  Filled 2024-12-06 – 2024-12-13 (×2): qty 30, 30d supply, fill #1
  Filled 2024-12-23 – 2025-01-05 (×2): qty 30, 30d supply, fill #2

## 2024-11-07 MED ORDER — GLIPIZIDE ER 10 MG PO TB24
10.0000 mg | ORAL_TABLET | Freq: Every day | ORAL | 3 refills | Status: AC
  Filled 2024-11-07: qty 30, 30d supply, fill #0
  Filled 2024-11-08 – 2024-12-23 (×2): qty 90, 90d supply, fill #0

## 2024-11-07 MED FILL — Metformin HCl Tab ER 24HR 500 MG: ORAL | 30 days supply | Qty: 120 | Fill #1 | Status: AC

## 2024-11-08 ENCOUNTER — Other Ambulatory Visit (HOSPITAL_COMMUNITY): Payer: Self-pay

## 2024-11-08 ENCOUNTER — Other Ambulatory Visit: Payer: Self-pay

## 2024-11-11 ENCOUNTER — Other Ambulatory Visit: Payer: Self-pay

## 2024-11-11 ENCOUNTER — Other Ambulatory Visit (HOSPITAL_COMMUNITY): Payer: Self-pay

## 2024-11-13 ENCOUNTER — Other Ambulatory Visit (HOSPITAL_COMMUNITY): Payer: Self-pay

## 2024-11-13 ENCOUNTER — Other Ambulatory Visit: Payer: Self-pay

## 2024-11-18 ENCOUNTER — Other Ambulatory Visit: Payer: Self-pay

## 2024-11-28 ENCOUNTER — Other Ambulatory Visit (HOSPITAL_COMMUNITY): Payer: Self-pay

## 2024-11-28 ENCOUNTER — Other Ambulatory Visit: Payer: Self-pay

## 2024-12-03 ENCOUNTER — Other Ambulatory Visit: Payer: Self-pay

## 2024-12-03 ENCOUNTER — Other Ambulatory Visit (HOSPITAL_COMMUNITY): Payer: Self-pay

## 2024-12-03 MED ORDER — FREESTYLE LIBRE 3 PLUS SENSOR MISC
2 refills | Status: AC
  Filled 2024-12-03: qty 6, 90d supply, fill #0
  Filled 2024-12-23: qty 6, 90d supply, fill #1

## 2024-12-03 MED ORDER — ACCU-CHEK SOFTCLIX LANCETS MISC
3 refills | Status: AC
  Filled 2024-12-03: qty 100, 90d supply, fill #0
  Filled 2024-12-04: qty 100, 100d supply, fill #0
  Filled 2024-12-23: qty 100, 90d supply, fill #0

## 2024-12-04 ENCOUNTER — Other Ambulatory Visit: Payer: Self-pay

## 2024-12-06 ENCOUNTER — Other Ambulatory Visit: Payer: Self-pay

## 2024-12-06 MED FILL — Metformin HCl Tab ER 24HR 500 MG: ORAL | 30 days supply | Qty: 120 | Fill #2 | Status: AC

## 2024-12-13 ENCOUNTER — Other Ambulatory Visit: Payer: Self-pay

## 2024-12-15 ENCOUNTER — Other Ambulatory Visit: Payer: Self-pay | Admitting: Adult Health

## 2024-12-16 ENCOUNTER — Other Ambulatory Visit (HOSPITAL_COMMUNITY): Payer: Self-pay

## 2024-12-16 ENCOUNTER — Other Ambulatory Visit: Payer: Self-pay

## 2024-12-17 ENCOUNTER — Other Ambulatory Visit (HOSPITAL_COMMUNITY): Payer: Self-pay

## 2024-12-17 MED ORDER — TRAZODONE HCL 50 MG PO TABS
ORAL_TABLET | ORAL | 0 refills | Status: AC
  Filled 2024-12-23: qty 30, 30d supply, fill #0

## 2024-12-23 ENCOUNTER — Other Ambulatory Visit: Payer: Self-pay

## 2024-12-23 ENCOUNTER — Other Ambulatory Visit (HOSPITAL_COMMUNITY): Payer: Self-pay

## 2024-12-23 ENCOUNTER — Other Ambulatory Visit: Payer: Self-pay | Admitting: Adult Health

## 2024-12-23 ENCOUNTER — Other Ambulatory Visit: Payer: Self-pay | Admitting: Allergy

## 2024-12-23 NOTE — Telephone Encounter (Signed)
 Contacted pt. Pt states she is no longer in [place]  but in [place]. Has new PCP. Pt states she will contact her pharmacy to send the Rx request to the correct provider. No further action is needed.

## 2024-12-24 ENCOUNTER — Other Ambulatory Visit (HOSPITAL_COMMUNITY): Payer: Self-pay

## 2024-12-27 ENCOUNTER — Other Ambulatory Visit: Payer: Self-pay

## 2024-12-30 ENCOUNTER — Other Ambulatory Visit (HOSPITAL_COMMUNITY): Payer: Self-pay

## 2024-12-30 ENCOUNTER — Other Ambulatory Visit: Payer: Self-pay

## 2025-01-05 ENCOUNTER — Other Ambulatory Visit: Payer: Self-pay

## 2025-01-06 ENCOUNTER — Other Ambulatory Visit (HOSPITAL_COMMUNITY): Payer: Self-pay

## 2025-01-06 ENCOUNTER — Other Ambulatory Visit: Payer: Self-pay

## 2025-01-07 ENCOUNTER — Other Ambulatory Visit: Payer: Self-pay

## 2025-01-17 ENCOUNTER — Other Ambulatory Visit (HOSPITAL_COMMUNITY): Payer: Self-pay

## 2025-01-17 MED ORDER — JANUVIA 50 MG PO TABS
50.0000 mg | ORAL_TABLET | Freq: Every morning | ORAL | 3 refills | Status: AC
  Filled 2025-01-17 – 2025-01-20 (×4): qty 90, 90d supply, fill #0

## 2025-01-18 ENCOUNTER — Other Ambulatory Visit (HOSPITAL_COMMUNITY): Payer: Self-pay

## 2025-01-18 MED ORDER — GABAPENTIN 300 MG PO CAPS
900.0000 mg | ORAL_CAPSULE | Freq: Three times a day (TID) | ORAL | 3 refills | Status: AC
  Filled 2025-01-18: qty 540, 60d supply, fill #0

## 2025-01-19 ENCOUNTER — Other Ambulatory Visit: Payer: Self-pay

## 2025-01-20 ENCOUNTER — Other Ambulatory Visit: Payer: Self-pay

## 2025-01-24 ENCOUNTER — Other Ambulatory Visit (HOSPITAL_COMMUNITY): Payer: Self-pay
